# Patient Record
Sex: Female | Born: 1945 | Race: White | Hispanic: No | Marital: Married | Smoking: Never smoker
Health system: Southern US, Community
[De-identification: ages and names within clinical notes are randomized; demographics above are authoritative.]

## PROBLEM LIST (undated history)

## (undated) DIAGNOSIS — E7800 Pure hypercholesterolemia, unspecified: Secondary | ICD-10-CM

## (undated) DIAGNOSIS — E039 Hypothyroidism, unspecified: Secondary | ICD-10-CM

## (undated) DIAGNOSIS — R251 Tremor, unspecified: Secondary | ICD-10-CM

## (undated) DIAGNOSIS — M1990 Unspecified osteoarthritis, unspecified site: Secondary | ICD-10-CM

## (undated) DIAGNOSIS — J189 Pneumonia, unspecified organism: Secondary | ICD-10-CM

## (undated) DIAGNOSIS — Z8489 Family history of other specified conditions: Secondary | ICD-10-CM

## (undated) DIAGNOSIS — E785 Hyperlipidemia, unspecified: Secondary | ICD-10-CM

## (undated) DIAGNOSIS — K222 Esophageal obstruction: Secondary | ICD-10-CM

## (undated) DIAGNOSIS — K5792 Diverticulitis of intestine, part unspecified, without perforation or abscess without bleeding: Secondary | ICD-10-CM

## (undated) DIAGNOSIS — I2584 Coronary atherosclerosis due to calcified coronary lesion: Principal | ICD-10-CM

## (undated) DIAGNOSIS — D649 Anemia, unspecified: Secondary | ICD-10-CM

## (undated) DIAGNOSIS — J3089 Other allergic rhinitis: Secondary | ICD-10-CM

## (undated) DIAGNOSIS — Z87442 Personal history of urinary calculi: Secondary | ICD-10-CM

## (undated) DIAGNOSIS — J45909 Unspecified asthma, uncomplicated: Secondary | ICD-10-CM

## (undated) DIAGNOSIS — K219 Gastro-esophageal reflux disease without esophagitis: Secondary | ICD-10-CM

## (undated) DIAGNOSIS — I2510 Atherosclerotic heart disease of native coronary artery without angina pectoris: Secondary | ICD-10-CM

## (undated) HISTORY — PX: OTHER SURGICAL HISTORY: SHX169

## (undated) HISTORY — DX: Pure hypercholesterolemia, unspecified: E78.00

## (undated) HISTORY — DX: Coronary atherosclerosis due to calcified coronary lesion: I25.84

## (undated) HISTORY — DX: Hyperlipidemia, unspecified: E78.5

## (undated) HISTORY — PX: BREAST SURGERY: SHX581

## (undated) HISTORY — DX: Atherosclerotic heart disease of native coronary artery without angina pectoris: I25.10

## (undated) HISTORY — PX: LITHOTRIPSY: SUR834

## (undated) HISTORY — PX: TUBAL LIGATION: SHX77

## (undated) HISTORY — PX: DILATION AND CURETTAGE OF UTERUS: SHX78

---

## 1970-10-09 HISTORY — PX: BREAST LUMPECTOMY: SHX2

## 2012-10-09 DIAGNOSIS — J189 Pneumonia, unspecified organism: Secondary | ICD-10-CM

## 2012-10-09 HISTORY — DX: Pneumonia, unspecified organism: J18.9

## 2013-12-09 DIAGNOSIS — E039 Hypothyroidism, unspecified: Secondary | ICD-10-CM | POA: Diagnosis not present

## 2013-12-09 DIAGNOSIS — E038 Other specified hypothyroidism: Secondary | ICD-10-CM | POA: Diagnosis not present

## 2014-01-24 DIAGNOSIS — N8500 Endometrial hyperplasia, unspecified: Secondary | ICD-10-CM | POA: Diagnosis not present

## 2014-01-24 DIAGNOSIS — K5732 Diverticulitis of large intestine without perforation or abscess without bleeding: Secondary | ICD-10-CM | POA: Diagnosis not present

## 2014-01-24 DIAGNOSIS — R1032 Left lower quadrant pain: Secondary | ICD-10-CM | POA: Diagnosis not present

## 2014-01-24 DIAGNOSIS — R350 Frequency of micturition: Secondary | ICD-10-CM | POA: Diagnosis not present

## 2014-01-24 DIAGNOSIS — R197 Diarrhea, unspecified: Secondary | ICD-10-CM | POA: Diagnosis not present

## 2014-01-24 DIAGNOSIS — K469 Unspecified abdominal hernia without obstruction or gangrene: Secondary | ICD-10-CM | POA: Diagnosis not present

## 2014-01-24 DIAGNOSIS — R110 Nausea: Secondary | ICD-10-CM | POA: Diagnosis not present

## 2014-02-04 DIAGNOSIS — K5732 Diverticulitis of large intestine without perforation or abscess without bleeding: Secondary | ICD-10-CM | POA: Diagnosis not present

## 2014-02-04 DIAGNOSIS — W57XXXA Bitten or stung by nonvenomous insect and other nonvenomous arthropods, initial encounter: Secondary | ICD-10-CM | POA: Diagnosis not present

## 2014-02-04 DIAGNOSIS — T148 Other injury of unspecified body region: Secondary | ICD-10-CM | POA: Diagnosis not present

## 2014-02-04 DIAGNOSIS — N809 Endometriosis, unspecified: Secondary | ICD-10-CM | POA: Diagnosis not present

## 2014-02-24 DIAGNOSIS — E785 Hyperlipidemia, unspecified: Secondary | ICD-10-CM | POA: Diagnosis not present

## 2014-02-24 DIAGNOSIS — Z0000 Encounter for general adult medical examination without abnormal findings: Secondary | ICD-10-CM | POA: Diagnosis not present

## 2014-02-24 DIAGNOSIS — N809 Endometriosis, unspecified: Secondary | ICD-10-CM | POA: Diagnosis not present

## 2014-02-24 DIAGNOSIS — L989 Disorder of the skin and subcutaneous tissue, unspecified: Secondary | ICD-10-CM | POA: Diagnosis not present

## 2014-02-24 DIAGNOSIS — K5732 Diverticulitis of large intestine without perforation or abscess without bleeding: Secondary | ICD-10-CM | POA: Diagnosis not present

## 2014-03-04 DIAGNOSIS — Z0000 Encounter for general adult medical examination without abnormal findings: Secondary | ICD-10-CM | POA: Diagnosis not present

## 2014-03-04 DIAGNOSIS — E785 Hyperlipidemia, unspecified: Secondary | ICD-10-CM | POA: Diagnosis not present

## 2014-03-11 ENCOUNTER — Other Ambulatory Visit: Payer: Self-pay | Admitting: Gastroenterology

## 2014-03-11 DIAGNOSIS — K5732 Diverticulitis of large intestine without perforation or abscess without bleeding: Secondary | ICD-10-CM | POA: Diagnosis not present

## 2014-03-11 DIAGNOSIS — N393 Stress incontinence (female) (male): Secondary | ICD-10-CM | POA: Diagnosis not present

## 2014-03-11 DIAGNOSIS — R1310 Dysphagia, unspecified: Secondary | ICD-10-CM | POA: Diagnosis not present

## 2014-03-11 DIAGNOSIS — L940 Localized scleroderma [morphea]: Secondary | ICD-10-CM | POA: Diagnosis not present

## 2014-03-11 DIAGNOSIS — N816 Rectocele: Secondary | ICD-10-CM | POA: Diagnosis not present

## 2014-03-12 ENCOUNTER — Other Ambulatory Visit: Payer: Self-pay | Admitting: Obstetrics & Gynecology

## 2014-03-12 DIAGNOSIS — Q838 Other congenital malformations of breast: Secondary | ICD-10-CM

## 2014-03-12 DIAGNOSIS — N6459 Other signs and symptoms in breast: Secondary | ICD-10-CM

## 2014-03-12 DIAGNOSIS — Z124 Encounter for screening for malignant neoplasm of cervix: Secondary | ICD-10-CM | POA: Diagnosis not present

## 2014-04-01 DIAGNOSIS — N393 Stress incontinence (female) (male): Secondary | ICD-10-CM | POA: Diagnosis not present

## 2014-04-01 DIAGNOSIS — D1801 Hemangioma of skin and subcutaneous tissue: Secondary | ICD-10-CM | POA: Diagnosis not present

## 2014-04-01 DIAGNOSIS — R141 Gas pain: Secondary | ICD-10-CM | POA: Diagnosis not present

## 2014-04-01 DIAGNOSIS — D485 Neoplasm of uncertain behavior of skin: Secondary | ICD-10-CM | POA: Diagnosis not present

## 2014-04-01 DIAGNOSIS — L821 Other seborrheic keratosis: Secondary | ICD-10-CM | POA: Diagnosis not present

## 2014-04-01 DIAGNOSIS — L819 Disorder of pigmentation, unspecified: Secondary | ICD-10-CM | POA: Diagnosis not present

## 2014-04-01 DIAGNOSIS — L820 Inflamed seborrheic keratosis: Secondary | ICD-10-CM | POA: Diagnosis not present

## 2014-04-09 ENCOUNTER — Encounter (HOSPITAL_COMMUNITY): Payer: Self-pay | Admitting: Pharmacist

## 2014-04-13 ENCOUNTER — Encounter (HOSPITAL_COMMUNITY): Payer: Self-pay | Admitting: Pharmacy Technician

## 2014-04-15 ENCOUNTER — Other Ambulatory Visit: Payer: Self-pay

## 2014-04-15 ENCOUNTER — Encounter (HOSPITAL_COMMUNITY): Payer: Self-pay

## 2014-04-15 ENCOUNTER — Encounter (HOSPITAL_COMMUNITY)
Admission: RE | Admit: 2014-04-15 | Discharge: 2014-04-15 | Disposition: A | Discharge status: Home / Self Care | Payer: Medicare Other | Source: Ambulatory Visit | Attending: Obstetrics & Gynecology | Admitting: Obstetrics & Gynecology

## 2014-04-15 DIAGNOSIS — Z01812 Encounter for preprocedural laboratory examination: Secondary | ICD-10-CM | POA: Diagnosis not present

## 2014-04-15 DIAGNOSIS — Z01810 Encounter for preprocedural cardiovascular examination: Secondary | ICD-10-CM | POA: Insufficient documentation

## 2014-04-15 HISTORY — DX: Pneumonia, unspecified organism: J18.9

## 2014-04-15 HISTORY — DX: Hypothyroidism, unspecified: E03.9

## 2014-04-15 HISTORY — DX: Esophageal obstruction: K22.2

## 2014-04-15 HISTORY — DX: Diverticulitis of intestine, part unspecified, without perforation or abscess without bleeding: K57.92

## 2014-04-15 HISTORY — DX: Gastro-esophageal reflux disease without esophagitis: K21.9

## 2014-04-15 LAB — CBC
HEMATOCRIT: 43.2 % (ref 36.0–46.0)
Hemoglobin: 14.5 g/dL (ref 12.0–15.0)
MCH: 31 pg (ref 26.0–34.0)
MCHC: 33.6 g/dL (ref 30.0–36.0)
MCV: 92.3 fL (ref 78.0–100.0)
Platelets: 257 10*3/uL (ref 150–400)
RBC: 4.68 MIL/uL (ref 3.87–5.11)
RDW: 13.8 % (ref 11.5–15.5)
WBC: 7.5 10*3/uL (ref 4.0–10.5)

## 2014-04-15 NOTE — Patient Instructions (Addendum)
Your procedure is scheduled on:04/20/14  Enter through the Main Entrance at :11am Pick up desk phone and dial [phone] and inform us of your arrival.  Please call [phone] if you have any problems the morning of surgery.  Remember: Do not eat food after midnight:Sunday Clear liquids are ok until:0830 am Monday   You may brush your teeth the morning of surgery.  Take these meds the morning of surgery with a sip of water: Synthroid  DO NOT wear jewelry, eye make-up, lipstick,body lotion, or dark fingernail polish.  (Polished toes are ok) You may wear deodorant.  If you are to be admitted after surgery, leave suitcase in car until your room has been assigned. Patients discharged on the day of surgery will not be allowed to drive home. Wear loose fitting, comfortable clothes for your ride home.

## 2014-04-20 ENCOUNTER — Ambulatory Visit (HOSPITAL_COMMUNITY)
Admission: RE | Admit: 2014-04-20 | Discharge: 2014-04-20 | Disposition: A | Discharge status: Home / Self Care | Payer: Medicare Other | Source: Ambulatory Visit | Attending: Obstetrics & Gynecology | Admitting: Obstetrics & Gynecology

## 2014-04-20 ENCOUNTER — Encounter (HOSPITAL_COMMUNITY): Payer: Self-pay

## 2014-04-20 ENCOUNTER — Encounter (HOSPITAL_COMMUNITY)
Admission: RE | Disposition: A | Discharge status: Home / Self Care | Payer: Self-pay | Source: Ambulatory Visit | Attending: Obstetrics & Gynecology

## 2014-04-20 ENCOUNTER — Ambulatory Visit (HOSPITAL_COMMUNITY): Payer: Medicare Other | Admitting: Certified Registered Nurse Anesthetist

## 2014-04-20 ENCOUNTER — Encounter (HOSPITAL_COMMUNITY): Payer: Medicare Other | Admitting: Certified Registered Nurse Anesthetist

## 2014-04-20 DIAGNOSIS — Z880 Allergy status to penicillin: Secondary | ICD-10-CM | POA: Diagnosis not present

## 2014-04-20 DIAGNOSIS — E039 Hypothyroidism, unspecified: Secondary | ICD-10-CM | POA: Diagnosis not present

## 2014-04-20 DIAGNOSIS — N859 Noninflammatory disorder of uterus, unspecified: Secondary | ICD-10-CM | POA: Diagnosis not present

## 2014-04-20 DIAGNOSIS — Z780 Asymptomatic menopausal state: Secondary | ICD-10-CM | POA: Diagnosis not present

## 2014-04-20 DIAGNOSIS — K5732 Diverticulitis of large intestine without perforation or abscess without bleeding: Secondary | ICD-10-CM | POA: Diagnosis not present

## 2014-04-20 DIAGNOSIS — K219 Gastro-esophageal reflux disease without esophagitis: Secondary | ICD-10-CM | POA: Insufficient documentation

## 2014-04-20 DIAGNOSIS — Q391 Atresia of esophagus with tracheo-esophageal fistula: Secondary | ICD-10-CM | POA: Insufficient documentation

## 2014-04-20 DIAGNOSIS — N840 Polyp of corpus uteri: Secondary | ICD-10-CM | POA: Insufficient documentation

## 2014-04-20 DIAGNOSIS — R9389 Abnormal findings on diagnostic imaging of other specified body structures: Secondary | ICD-10-CM | POA: Diagnosis not present

## 2014-04-20 DIAGNOSIS — Q393 Congenital stenosis and stricture of esophagus: Secondary | ICD-10-CM

## 2014-04-20 DIAGNOSIS — Z9889 Other specified postprocedural states: Secondary | ICD-10-CM

## 2014-04-20 HISTORY — PX: HYSTEROSCOPY WITH D & C: SHX1775

## 2014-04-20 HISTORY — DX: Family history of other specified conditions: Z84.89

## 2014-04-20 SURGERY — DILATATION AND CURETTAGE /HYSTEROSCOPY
Anesthesia: General | Site: Vagina

## 2014-04-20 MED ORDER — ONDANSETRON HCL 4 MG/2ML IJ SOLN
INTRAMUSCULAR | Status: DC | PRN
  Administered 2014-04-20: 4 mg via INTRAVENOUS

## 2014-04-20 MED ORDER — KETOROLAC TROMETHAMINE 30 MG/ML IJ SOLN
INTRAMUSCULAR | Status: DC | PRN
  Administered 2014-04-20: 30 mg via INTRAVENOUS

## 2014-04-20 MED ORDER — KETOROLAC TROMETHAMINE 30 MG/ML IJ SOLN
INTRAMUSCULAR | Status: AC
  Filled 2014-04-20: qty 1

## 2014-04-20 MED ORDER — LIDOCAINE HCL 1 % IJ SOLN
INTRAMUSCULAR | Status: AC
  Filled 2014-04-20: qty 20

## 2014-04-20 MED ORDER — FENTANYL CITRATE 0.05 MG/ML IJ SOLN
INTRAMUSCULAR | Status: AC
  Filled 2014-04-20: qty 5

## 2014-04-20 MED ORDER — FENTANYL CITRATE 0.05 MG/ML IJ SOLN
INTRAMUSCULAR | Status: DC | PRN
  Administered 2014-04-20 (×2): 25 ug via INTRAVENOUS
  Administered 2014-04-20 (×4): 50 ug via INTRAVENOUS

## 2014-04-20 MED ORDER — PROPOFOL 10 MG/ML IV EMUL
INTRAVENOUS | Status: AC
  Filled 2014-04-20: qty 20

## 2014-04-20 MED ORDER — PROPOFOL 10 MG/ML IV BOLUS
INTRAVENOUS | Status: DC | PRN
  Administered 2014-04-20: 30 mg via INTRAVENOUS
  Administered 2014-04-20: 170 mg via INTRAVENOUS

## 2014-04-20 MED ORDER — MIDAZOLAM HCL 2 MG/2ML IJ SOLN
INTRAMUSCULAR | Status: AC
  Filled 2014-04-20: qty 2

## 2014-04-20 MED ORDER — LIDOCAINE HCL (CARDIAC) 20 MG/ML IV SOLN
INTRAVENOUS | Status: AC
  Filled 2014-04-20: qty 5

## 2014-04-20 MED ORDER — FENTANYL CITRATE 0.05 MG/ML IJ SOLN
25.0000 ug | INTRAMUSCULAR | Status: DC | PRN
  Administered 2014-04-20: 25 ug via INTRAVENOUS

## 2014-04-20 MED ORDER — FENTANYL CITRATE 0.05 MG/ML IJ SOLN
INTRAMUSCULAR | Status: AC
  Filled 2014-04-20: qty 2

## 2014-04-20 MED ORDER — OXYCODONE-ACETAMINOPHEN 5-325 MG PO TABS: 1.0000 | ORAL_TABLET | ORAL | Status: DC | PRN

## 2014-04-20 MED ORDER — EPHEDRINE SULFATE 50 MG/ML IJ SOLN
INTRAMUSCULAR | Status: DC | PRN
  Administered 2014-04-20: 10 mg via INTRAVENOUS
  Administered 2014-04-20: 5 mg via INTRAVENOUS

## 2014-04-20 MED ORDER — MIDAZOLAM HCL 2 MG/2ML IJ SOLN
INTRAMUSCULAR | Status: DC | PRN
  Administered 2014-04-20: 1 mg via INTRAVENOUS

## 2014-04-20 MED ORDER — LIDOCAINE HCL (CARDIAC) 20 MG/ML IV SOLN
INTRAVENOUS | Status: DC | PRN
  Administered 2014-04-20: 30 mg via INTRAVENOUS
  Administered 2014-04-20: 70 mg via INTRAVENOUS

## 2014-04-20 MED ORDER — IBUPROFEN 800 MG PO TABS: 800.0000 mg | ORAL_TABLET | Freq: Three times a day (TID) | ORAL | Status: DC | PRN

## 2014-04-20 MED ORDER — LIDOCAINE HCL 1 % IJ SOLN
INTRAMUSCULAR | Status: DC | PRN
  Administered 2014-04-20: 10 mL

## 2014-04-20 MED ORDER — LACTATED RINGERS IV SOLN
INTRAVENOUS | Status: DC
  Administered 2014-04-20: 11:00:00 via INTRAVENOUS

## 2014-04-20 MED ORDER — ONDANSETRON HCL 4 MG/2ML IJ SOLN
INTRAMUSCULAR | Status: AC
  Filled 2014-04-20: qty 2

## 2014-04-20 SURGICAL SUPPLY — 19 items
CANISTER SUCT 3000ML (MISCELLANEOUS) ×2 IMPLANT
CATH ROBINSON RED A/P 16FR (CATHETERS) ×2 IMPLANT
CLOTH BEACON ORANGE TIMEOUT ST (SAFETY) ×2 IMPLANT
CONTAINER PREFILL 10% NBF 60ML (FORM) ×4 IMPLANT
DRAPE HYSTEROSCOPY (DRAPE) ×2 IMPLANT
ELECT REM PT RETURN 9FT ADLT (ELECTROSURGICAL)
ELECTRODE REM PT RTRN 9FT ADLT (ELECTROSURGICAL) IMPLANT
GLOVE BIO SURGEON STRL SZ 6.5 (GLOVE) ×2 IMPLANT
GLOVE BIO SURGEON STRL SZ7 (GLOVE) ×2 IMPLANT
GLOVE BIOGEL PI IND STRL 7.0 (GLOVE) ×1 IMPLANT
GLOVE BIOGEL PI INDICATOR 7.0 (GLOVE) ×1
GOWN STRL REUS W/TWL LRG LVL3 (GOWN DISPOSABLE) ×4 IMPLANT
LOOP ANGLED CUTTING 22FR (CUTTING LOOP) IMPLANT
PACK VAGINAL MINOR WOMEN LF (CUSTOM PROCEDURE TRAY) ×2 IMPLANT
PAD OB MATERNITY 4.3X12.25 (PERSONAL CARE ITEMS) ×2 IMPLANT
SET TUBING HYSTEROSCOPY 2 NDL (TUBING) ×2 IMPLANT
TOWEL OR 17X24 6PK STRL BLUE (TOWEL DISPOSABLE) ×4 IMPLANT
TUBE HYSTEROSCOPY W Y-CONNECT (TUBING) ×2 IMPLANT
WATER STERILE IRR 1000ML POUR (IV SOLUTION) ×2 IMPLANT

## 2014-04-20 NOTE — Op Note (Signed)
Surgeon: Caffie Damme   Assistants: none  Pre-operative Diagnosis: Thickened Endometrial stripe in post menopausal female  Post-operative Diagnosis: same  Surgeon: Surgeon(s) and Role:    * [name], MD - Primary   Procedure Procedure(s) and Anesthesia Type:    * DILATATION AND CURETTAGE /HYSTEROSCOPY - General  Anesthesia: General LMA anesthesia  ASA Class: 2  EBL: 10 mL  UOP: 10 mL  IVF: 700 mL  Specimen: Endometrial curettings  Findings: Exam under anesthesia: External vulva: Evidence of lichen sclerosis et atrophicus. Vaginal vault: normal cervix: grossly normal  Uterus: Enlarged for Post menopausal female 10 weeks size, mobile, no palpable masses Adnexa: no palpable masses Hysteroscopy: hyperemia throughout,  Endometrial polyps, with possible calcifications.   Complications: None  After adequate anesthesia was achieved, the patient was prepped and draped in the usual sterile fashion.  The speculum was placed in the vagina and the cervix stabilized with a single-tooth tenaculum.  The  cervix was dilated with pratt dilators and sounded to 8 cm. The hysteroscope passed inside the endometrial cavity. The above findings were noted and sharp curettage was then performed and uterine curettings sent to path. Hysteroscope was re-introduced and the endometrial polyp had been extracted. All instruments were removed from the vagina.  The patient tolerated the procedure well.     Georgean Spainhower STACIA

## 2014-04-20 NOTE — Discharge Instructions (Signed)

## 2014-04-20 NOTE — Anesthesia Preprocedure Evaluation (Signed)
Anesthesia Evaluation  Patient identified by MRN, date of birth, ID band Patient awake    Reviewed: Allergy & Precautions, H&P , Patient's Chart, lab work & pertinent test results, reviewed documented beta blocker date and time   Airway Mallampati: II TM Distance: >3 FB Neck ROM: full    Dental no notable dental hx.    Pulmonary  breath sounds clear to auscultation  Pulmonary exam normal       Cardiovascular Rhythm:regular Rate:Normal     Neuro/Psych    GI/Hepatic   Endo/Other    Renal/GU      Musculoskeletal   Abdominal   Peds  Hematology   Anesthesia Other Findings   Reproductive/Obstetrics                           Anesthesia Physical Anesthesia Plan  ASA: III  Anesthesia Plan:    Post-op Pain Management:    Induction: Intravenous  Airway Management Planned: LMA  Additional Equipment:   Intra-op Plan:   Post-operative Plan:   Informed Consent: I have reviewed the patients History and Physical, chart, labs and discussed the procedure including the risks, benefits and alternatives for the proposed anesthesia with the patient or authorized representative who has indicated his/her understanding and acceptance.   Dental Advisory Given and Dental advisory given  Plan Discussed with: CRNA and Surgeon  Anesthesia Plan Comments: (Discussed GA with LMA, possible sore throat, potential need to switch to ETT, N/V, pulmonary aspiration. Questions answered. )        Anesthesia Quick Evaluation

## 2014-04-20 NOTE — Discharge Summary (Signed)
Physician Discharge Summary  Patient ID: Katie Weber MRN: [id number] DOB/AGE: 1946/08/04 68 y.o.  Admit date: 04/20/2014 Discharge date: 04/20/2014  Admission Diagnoses:Thickened EMS  Discharge Diagnoses:  Active Problems:   * No active hospital problems. *   Discharged Condition: good  Hospital Course: Patient taken to OR for D&C hysteroscopy patient tolerated procedure well and was Discharged home from PACU  Consults: None  Significant Diagnostic Studies: radiology: Ultrasound: thickened EMS 47mm cystic components  Treatments: IV hydration  Discharge Exam: Blood pressure 145/89, pulse 76, temperature 98.2 F (36.8 C), temperature source Oral, resp. rate 18, SpO2 99.00%. Findings in OR: Findings: Exam under anesthesia: External vulva: Evidence of lichen sclerosis et atrophicus. Vaginal vault: normal cervix: grossly normal  Uterus: Enlarged for Post menopausal female 10 weeks size, mobile, no palpable masses Adnexa: no palpable masses Hysteroscopy: hyperemia throughout,  Endometrial polyps, with possible calcifications.    Disposition: Final discharge disposition not confirmed  Discharge Instructions   Call MD for:  difficulty breathing, headache or visual disturbances    Complete by:  As directed      Call MD for:  extreme fatigue    Complete by:  As directed      Call MD for:  hives    Complete by:  As directed      Call MD for:  persistant dizziness or light-headedness    Complete by:  As directed      Call MD for:  persistant nausea and vomiting    Complete by:  As directed      Call MD for:  redness, tenderness, or signs of infection (pain, swelling, redness, odor or green/yellow discharge around incision site)    Complete by:  As directed      Call MD for:  severe uncontrolled pain    Complete by:  As directed      Call MD for:  temperature >100.4    Complete by:  As directed      Call MD for:    Complete by:  As directed   Heavy vaginal bleeding     Diet - low sodium heart healthy    Complete by:  As directed      Discharge instructions    Complete by:  As directed   Call office for post operative appointment or for any questions or concerns     Increase activity slowly    Complete by:  As directed      Sexual Activity Restrictions    Complete by:  As directed   No intercourse for at least 2-3 weeks            Medication List         ibuprofen 800 MG tablet  Commonly known as:  ADVIL,MOTRIN  Take 1 tablet (800 mg total) by mouth every 8 (eight) hours as needed for cramping.     levothyroxine 100 MCG tablet  Commonly known as:  SYNTHROID, LEVOTHROID  Take 100 mcg by mouth daily before breakfast.     oxyCODONE-acetaminophen 5-325 MG per tablet  Commonly known as:  ROXICET  Take 1-2 tablets by mouth every 4 (four) hours as needed for severe pain.     permethrin 5 % cream  Commonly known as:  ELIMITE  Apply 1 application topically once.     tretinoin 0.025 % gel  Commonly known as:  RETIN-A  Apply 1 application topically every other day. Takes at bedtime         Signed: [name]  STACIA 04/20/2014, 1:09 PM

## 2014-04-20 NOTE — Brief Op Note (Signed)
04/20/2014  1:02 PM  PATIENT:  Mechele Claude Spurrier  68 y.o. female  PRE-OPERATIVE DIAGNOSIS:  THICKENED ENDOMETRIUM  POST-OPERATIVE DIAGNOSIS:  THICKENED ENDOMETRIUM  PROCEDURE:  Procedure(s): DILATATION AND CURETTAGE /HYSTEROSCOPY (N/A)  SURGEON:  Surgeon(s) and Role:    * [name], MD - Primary  PHYSICIAN ASSISTANT:   ASSISTANTS: none   ANESTHESIA:   local and IV sedation  EBL:  Total I/O In: 700 [I.V.:700] Out: 20 [Urine:10; Blood:10]  BLOOD ADMINISTERED:none  DRAINS: none   LOCAL MEDICATIONS USED:  LIDOCAINE   SPECIMEN:  Source of Specimen:  Endometrial curettings  DISPOSITION OF SPECIMEN:  PATHOLOGY  COUNTS:  YES  TOURNIQUET:  * No tourniquets in log *  DICTATION: .Note written in EPIC  PLAN OF CARE: Discharge to home after PACU  PATIENT DISPOSITION:  PACU - hemodynamically stable.   Delay start of Pharmacological VTE agent (>24hrs) due to surgical blood loss or risk of bleeding: not applicable  Demani Weyrauch STACIA

## 2014-04-20 NOTE — Transfer of Care (Signed)
Immediate Anesthesia Transfer of Care Note  Patient: Katie Weber  Procedure(s) Performed: Procedure(s): DILATATION AND CURETTAGE /HYSTEROSCOPY (N/A)  Patient Location: PACU  Anesthesia Type:General  Level of Consciousness: awake, alert , oriented and patient cooperative  Airway & Oxygen Therapy: Patient Spontanous Breathing and Patient connected to nasal cannula oxygen  Post-op Assessment: Report given to PACU RN and Post -op Vital signs reviewed and stable  Post vital signs: Reviewed and stable  Complications: No apparent anesthesia complications

## 2014-04-20 NOTE — Anesthesia Postprocedure Evaluation (Signed)
  Anesthesia Post-op Note  Patient: Katie Weber  Procedure(s) Performed: Procedure(s): DILATATION AND CURETTAGE /HYSTEROSCOPY (N/A)  Patient Location: PACU  Anesthesia Type:General  Level of Consciousness: awake, alert  and oriented  Airway and Oxygen Therapy: Patient Spontanous Breathing  Post-op Pain: mild  Post-op Assessment: Post-op Vital signs reviewed, Patient's Cardiovascular Status Stable, Respiratory Function Stable, Patent Airway, No signs of Nausea or vomiting and Pain level controlled  Post-op Vital Signs: Reviewed and stable  Last Vitals:  Filed Vitals:   04/20/14 1427  BP:   Pulse: 67  Temp:   Resp:     Complications: No apparent anesthesia complications

## 2014-04-20 NOTE — H&P (Signed)
Katie Weber is an 68 y.o. female presents for [facility] hysteroscopy for thickened endometrial  Stripe on both CT scan and ultrasound. Pelvic US shows multiple cystic regions of endometrium  Measures 43mm. Patient does not have post menopausal bleeding  Pertinent Gynecological History: Menses: post-menopausal Bleeding: no PMB Contraception: none DES exposure: denies Blood transfusions: none Sexually transmitted diseases: no past history Previous GYN Procedures: DNC  Last mammogram: normal Last pap: normal Date: 04/01/2014 OB History: G4, P2   Menstrual History: Menarche age: 60  No LMP recorded. Patient is postmenopausal.    Past Medical History  Diagnosis Date  . Hypothyroidism   . Pneumonia 2014  . GERD (gastroesophageal reflux disease)     no meds  . Diverticulitis   . 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 Family history of anesthesia complication 26JFH ago    allergic reaction to atropine    Past Surgical History  Procedure Laterality Date  . Lithotripsy    . Tubal ligation    . Breast surgery      No family history on file.  Social History:  has no tobacco, alcohol, and drug history on file.  Allergies:  Allergies  Allergen Reactions  . Penicillins Hives    Prescriptions prior to admission  Medication Sig Dispense Refill  . levothyroxine (SYNTHROID, LEVOTHROID) 100 MCG tablet Take 100 mcg by mouth daily before breakfast.      . permethrin (ELIMITE) 5 % cream Apply 1 application topically once.      . tretinoin (RETIN-A) 0.025 % gel Apply 1 application topically every other day. Takes at bedtime        Review of Systems  Constitutional: Negative for fever and chills.  Eyes: Negative for blurred vision.  Respiratory: Negative for cough and hemoptysis.   Cardiovascular: Negative for chest pain and palpitations.  Gastrointestinal: Negative for heartburn, nausea and vomiting.  Genitourinary: Negative for dysuria.   Musculoskeletal: Negative for myalgias.  Skin: Negative for rash.  Neurological: Negative for dizziness and headaches.  Endo/Heme/Allergies: Does not bruise/bleed easily.  Psychiatric/Behavioral: Negative for depression.    Blood pressure 145/89, pulse 76, temperature 98.2 F (36.8 C), temperature source Oral, resp. rate 18, SpO2 99.00%. Physical Exam  Constitutional: She is oriented to person, place, and time. She appears well-developed and well-nourished.  HENT:  Head: Normocephalic.  Neck: Normal range of motion.  Cardiovascular: Normal rate and regular rhythm.   Respiratory: Effort normal and breath sounds normal.  GI: Soft.  Genitourinary: Vagina normal and uterus normal.  Musculoskeletal: Normal range of motion.  Neurological: She is alert and oriented to person, place, and time.    No results found for this or any previous visit (from the past 24 hour(s)).  No results found.  Assessment/Plan: Post menopausal female with thickened endometrial stripe Suspicious for endometrial hyperplasia.  We discussed R/B/A/I for [facility] hysteroscopy will send tissue to Pathology  [name] [facility] 04/20/2014, 11:38 AM

## 2014-04-21 ENCOUNTER — Encounter (HOSPITAL_COMMUNITY): Payer: Self-pay | Admitting: Obstetrics & Gynecology

## 2014-04-27 ENCOUNTER — Encounter (HOSPITAL_COMMUNITY): Payer: Self-pay | Admitting: *Deleted

## 2014-05-04 ENCOUNTER — Encounter (HOSPITAL_COMMUNITY)
Admission: RE | Disposition: A | Discharge status: Home / Self Care | Payer: Self-pay | Source: Ambulatory Visit | Attending: Gastroenterology

## 2014-05-04 ENCOUNTER — Encounter (HOSPITAL_COMMUNITY): Payer: Medicare Other | Admitting: Anesthesiology

## 2014-05-04 ENCOUNTER — Ambulatory Visit (HOSPITAL_COMMUNITY)
Admission: RE | Admit: 2014-05-04 | Discharge: 2014-05-04 | Disposition: A | Discharge status: Home / Self Care | Payer: Medicare Other | Source: Ambulatory Visit | Attending: Gastroenterology | Admitting: Gastroenterology

## 2014-05-04 ENCOUNTER — Other Ambulatory Visit (HOSPITAL_COMMUNITY): Payer: Self-pay | Admitting: Gastroenterology

## 2014-05-04 ENCOUNTER — Encounter (HOSPITAL_COMMUNITY): Payer: Self-pay | Admitting: *Deleted

## 2014-05-04 ENCOUNTER — Ambulatory Visit (HOSPITAL_COMMUNITY): Payer: Medicare Other | Admitting: Anesthesiology

## 2014-05-04 DIAGNOSIS — IMO0002 Reserved for concepts with insufficient information to code with codable children: Secondary | ICD-10-CM | POA: Insufficient documentation

## 2014-05-04 DIAGNOSIS — Z01818 Encounter for other preprocedural examination: Secondary | ICD-10-CM | POA: Diagnosis not present

## 2014-05-04 DIAGNOSIS — K449 Diaphragmatic hernia without obstruction or gangrene: Secondary | ICD-10-CM | POA: Diagnosis not present

## 2014-05-04 DIAGNOSIS — Z9851 Tubal ligation status: Secondary | ICD-10-CM | POA: Diagnosis not present

## 2014-05-04 DIAGNOSIS — R1310 Dysphagia, unspecified: Secondary | ICD-10-CM

## 2014-05-04 DIAGNOSIS — D375 Neoplasm of uncertain behavior of rectum: Secondary | ICD-10-CM | POA: Insufficient documentation

## 2014-05-04 DIAGNOSIS — D126 Benign neoplasm of colon, unspecified: Secondary | ICD-10-CM | POA: Diagnosis not present

## 2014-05-04 DIAGNOSIS — K5730 Diverticulosis of large intestine without perforation or abscess without bleeding: Secondary | ICD-10-CM | POA: Insufficient documentation

## 2014-05-04 DIAGNOSIS — K219 Gastro-esophageal reflux disease without esophagitis: Secondary | ICD-10-CM | POA: Diagnosis not present

## 2014-05-04 DIAGNOSIS — E039 Hypothyroidism, unspecified: Secondary | ICD-10-CM | POA: Insufficient documentation

## 2014-05-04 DIAGNOSIS — D371 Neoplasm of uncertain behavior of stomach: Secondary | ICD-10-CM | POA: Diagnosis not present

## 2014-05-04 DIAGNOSIS — Y838 Other surgical procedures as the cause of abnormal reaction of the patient, or of later complication, without mention of misadventure at the time of the procedure: Secondary | ICD-10-CM | POA: Diagnosis not present

## 2014-05-04 DIAGNOSIS — Z79899 Other long term (current) drug therapy: Secondary | ICD-10-CM | POA: Insufficient documentation

## 2014-05-04 DIAGNOSIS — R1319 Other dysphagia: Secondary | ICD-10-CM | POA: Insufficient documentation

## 2014-05-04 DIAGNOSIS — K222 Esophageal obstruction: Secondary | ICD-10-CM | POA: Diagnosis not present

## 2014-05-04 DIAGNOSIS — J45909 Unspecified asthma, uncomplicated: Secondary | ICD-10-CM | POA: Diagnosis not present

## 2014-05-04 DIAGNOSIS — D378 Neoplasm of uncertain behavior of other specified digestive organs: Secondary | ICD-10-CM

## 2014-05-04 DIAGNOSIS — Z87442 Personal history of urinary calculi: Secondary | ICD-10-CM | POA: Diagnosis not present

## 2014-05-04 DIAGNOSIS — J984 Other disorders of lung: Secondary | ICD-10-CM | POA: Insufficient documentation

## 2014-05-04 HISTORY — PX: COLONOSCOPY WITH PROPOFOL: SHX5780

## 2014-05-04 HISTORY — PX: ESOPHAGOGASTRODUODENOSCOPY (EGD) WITH PROPOFOL: SHX5813

## 2014-05-04 HISTORY — PX: BALLOON DILATION: SHX5330

## 2014-05-04 HISTORY — DX: Unspecified osteoarthritis, unspecified site: M19.90

## 2014-05-04 HISTORY — DX: Personal history of urinary calculi: Z87.442

## 2014-05-04 IMAGING — CR DG CHEST 2V
2 series · 2 of 2 positions shown · non-contrast
Comparison: None.

CLINICAL DATA: Dysphagia.  History of pneumonia in [4P].

EXAM:
CHEST  2 VIEW

[w chest pa]
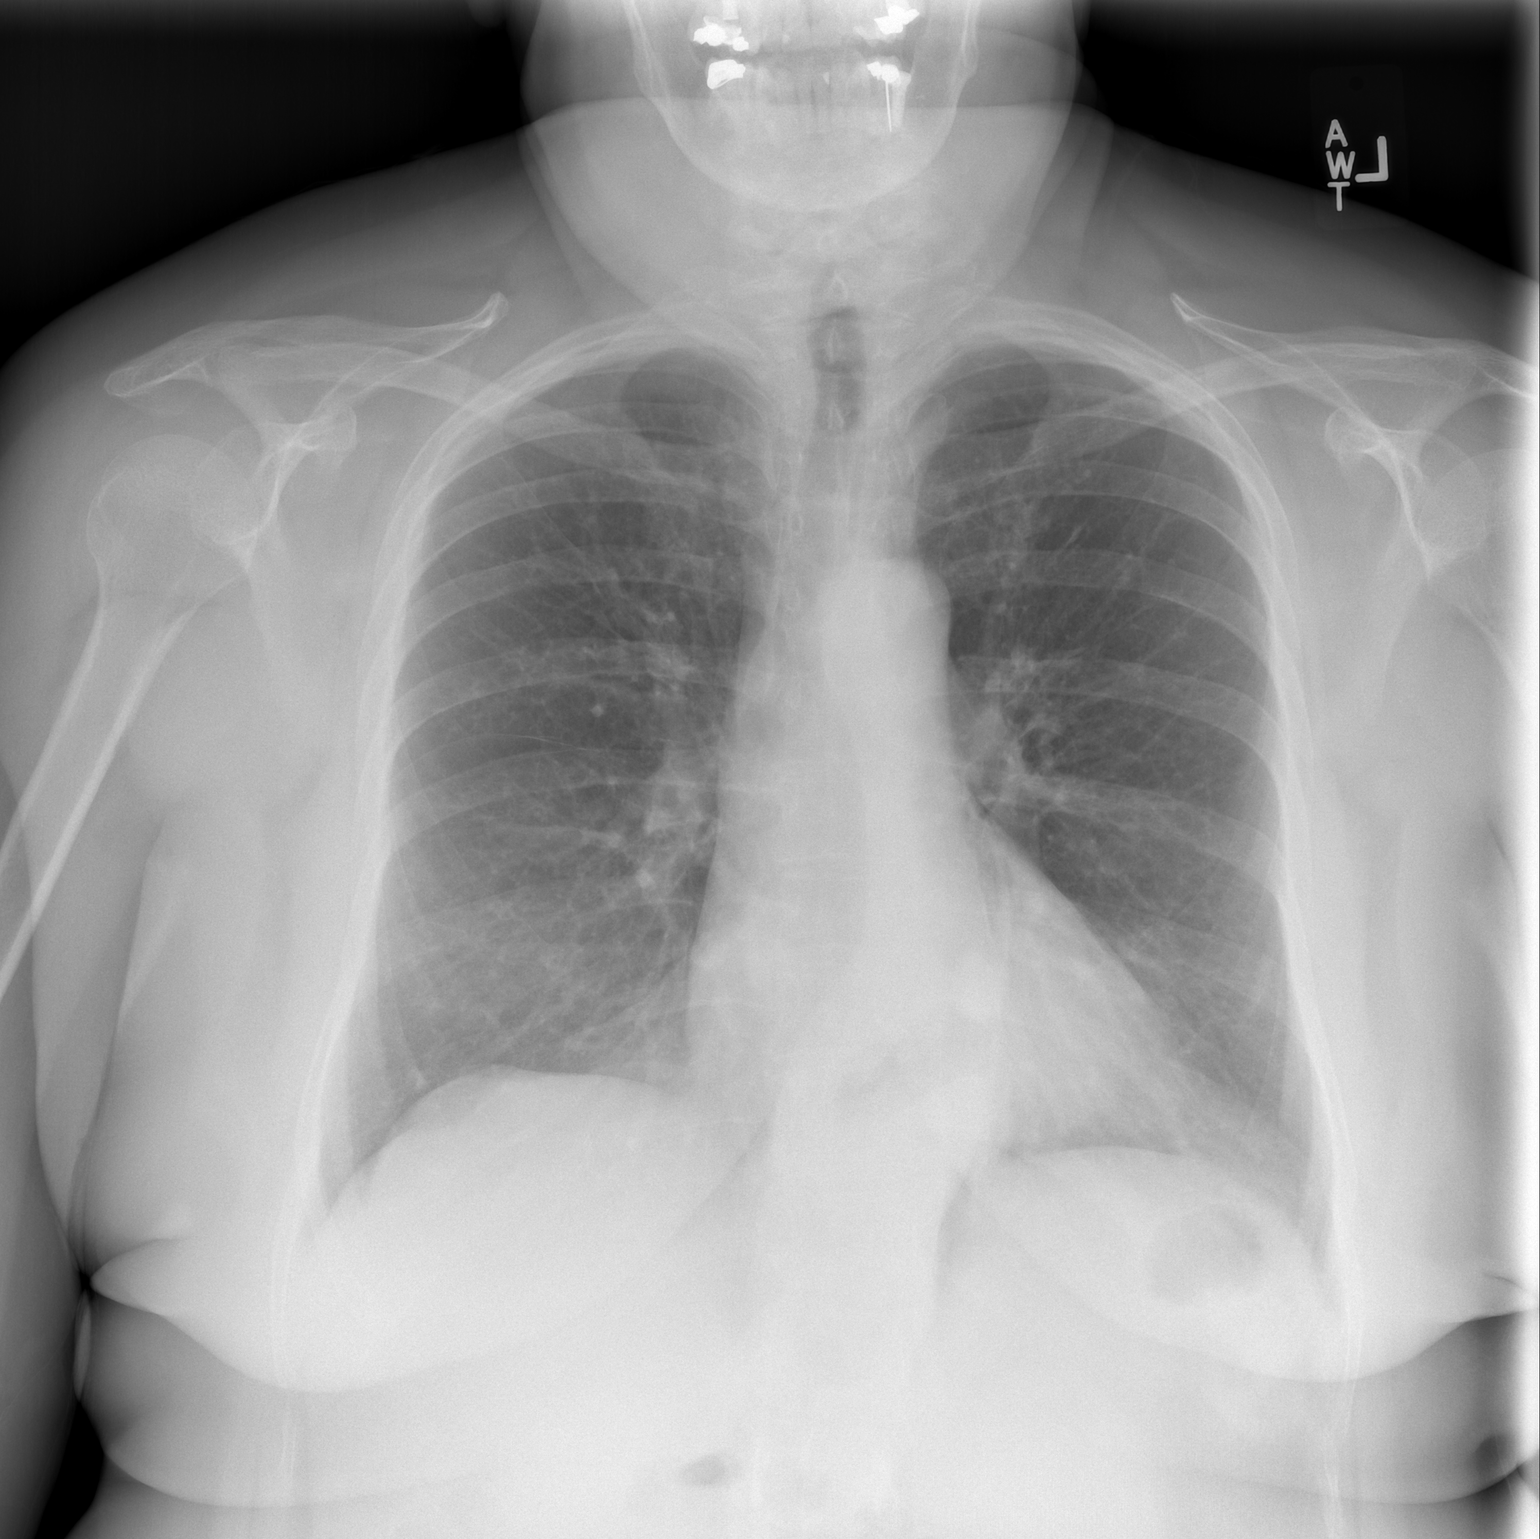

[w chest lat]
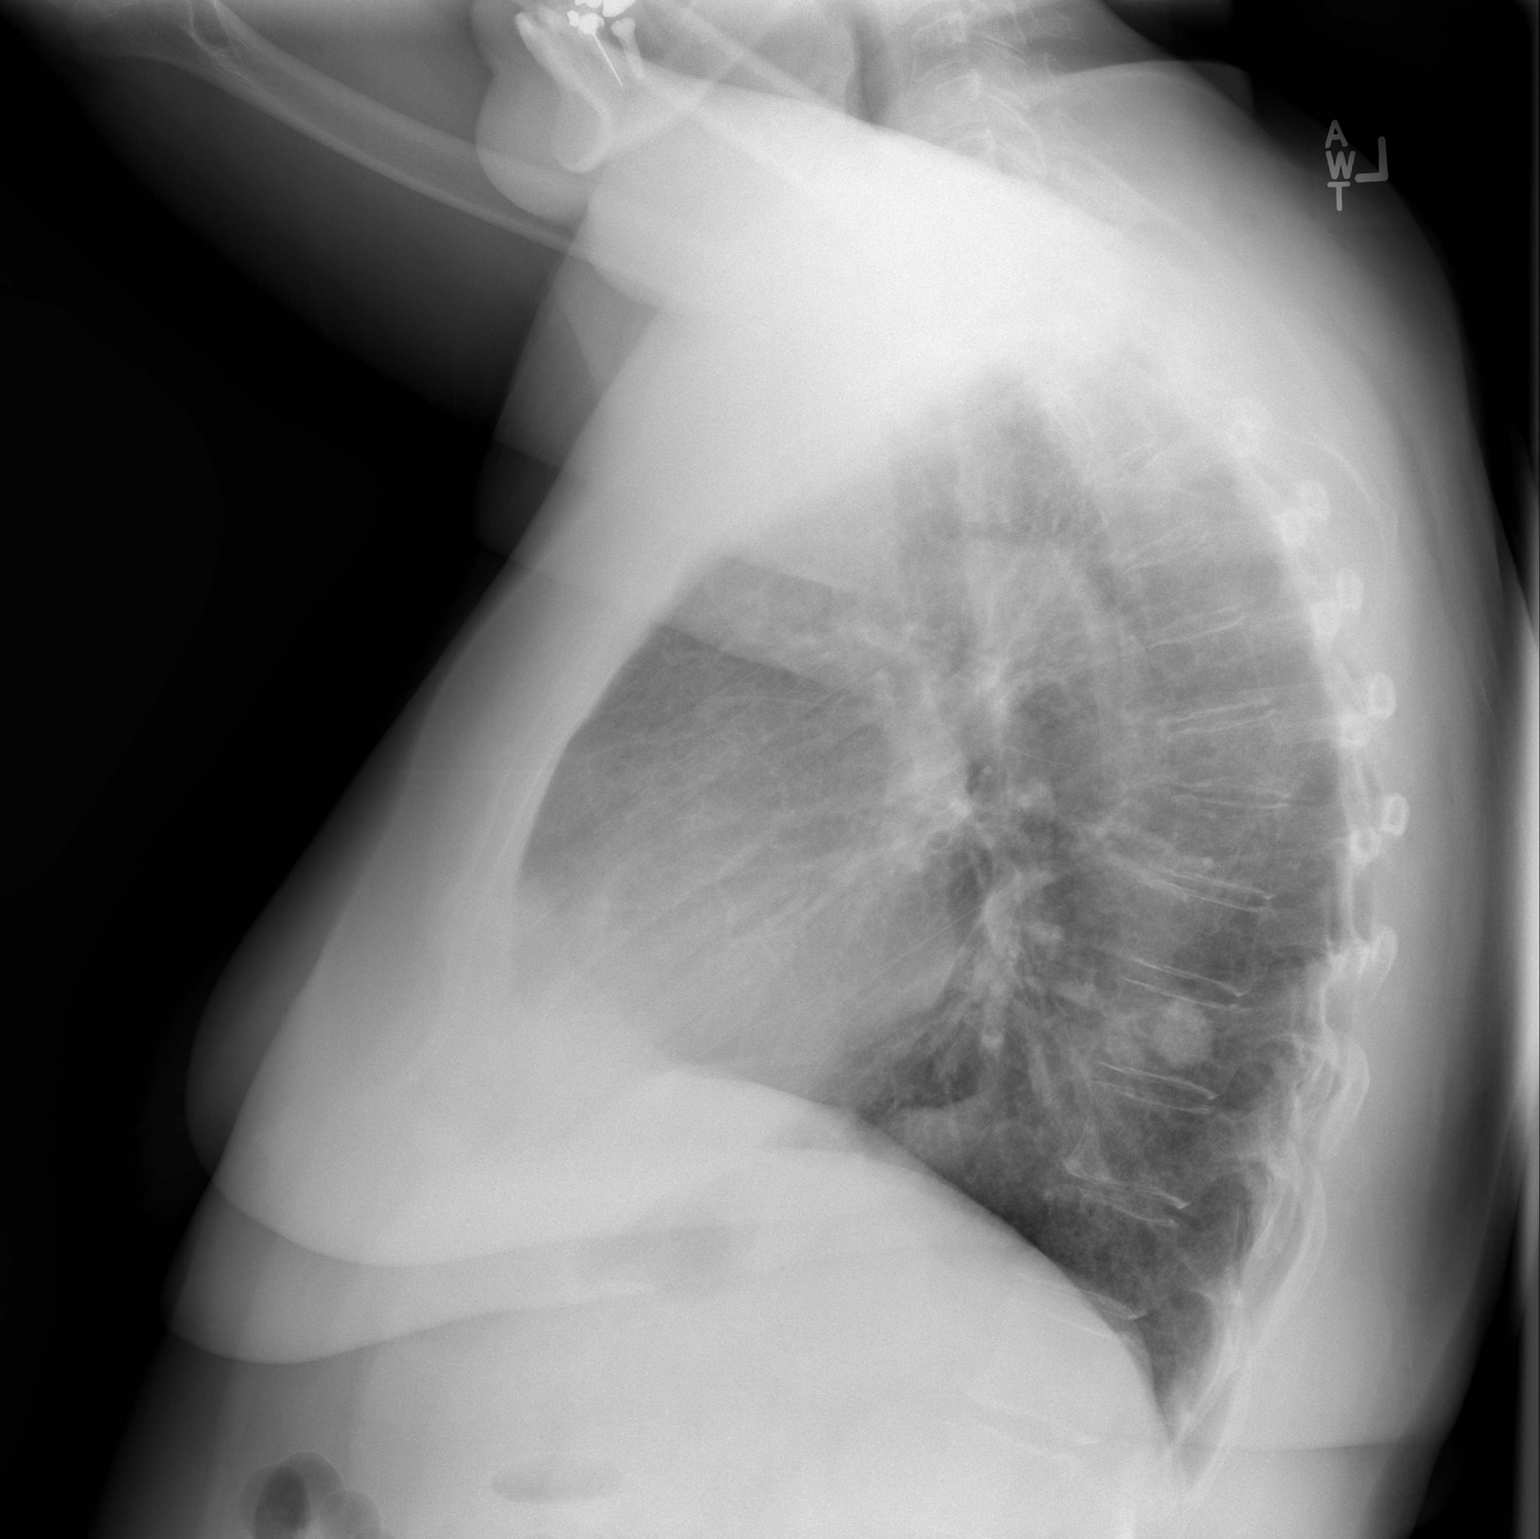

[2 of 2 positions shown; findings below may reference images not displayed]

FINDINGS: Mild wedging of a mid thoracic vertebral body.

Midline trachea. Normal heart size. Small to moderate hiatal hernia
with suggestion of air-fluid level in the herniated stomach. No
pleural effusion or pneumothorax. Increased density posteriorly on
the lateral view measures 1.8 cm. Favored to represent a a left
lower lobe calcified granuloma, when correlated with the frontal
radiograph. Right lung clear.
IMPRESSION: Small to moderate hiatal hernia. Suggestion of fluid level in the
herniated stomach, indicative of dysmotility or gastroesophageal
reflux disease.

Calcified granuloma within the left lower lobe.

Mild mid thoracic compression deformity.

## 2014-05-04 SURGERY — COLONOSCOPY WITH PROPOFOL
Anesthesia: Monitor Anesthesia Care

## 2014-05-04 MED ORDER — MIDAZOLAM HCL 5 MG/5ML IJ SOLN
INTRAMUSCULAR | Status: DC | PRN
  Administered 2014-05-04 (×2): 1 mg via INTRAVENOUS

## 2014-05-04 MED ORDER — LACTATED RINGERS IV SOLN
INTRAVENOUS | Status: DC
  Administered 2014-05-04: 13:00:00 via INTRAVENOUS

## 2014-05-04 MED ORDER — SODIUM CHLORIDE 0.9 % IV SOLN: INTRAVENOUS | Status: DC

## 2014-05-04 MED ORDER — PROPOFOL 10 MG/ML IV BOLUS
INTRAVENOUS | Status: DC | PRN
  Administered 2014-05-04: 40 mg via INTRAVENOUS
  Administered 2014-05-04: 20 mg via INTRAVENOUS
  Administered 2014-05-04: 40 mg via INTRAVENOUS
  Administered 2014-05-04: 20 mg via INTRAVENOUS

## 2014-05-04 MED ORDER — FENTANYL CITRATE 0.05 MG/ML IJ SOLN
INTRAMUSCULAR | Status: DC | PRN
  Administered 2014-05-04 (×2): 50 ug via INTRAVENOUS

## 2014-05-04 SURGICAL SUPPLY — 24 items

## 2014-05-04 NOTE — Op Note (Signed)
Problems: Esophageal dysphagia. 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 Screening colonoscopy post treatment of acute diverticulitis  Endoscopist: Earle Gell  Premedication: Propofol administered by anesthesia  Procedure: Diagnostic esophagogastroduodenoscopy, esophageal balloon dilation of the benign stricture at the esophagogastric junction, application of an Endo Clip to stop bleeding at the esophagogastric The patient was placed in the left lateral decubitus position. The Pentax gastroscope was passed through the posterior hypopharynx into the proximal esophagus without difficulty. I did not visualize the vocal cords  Esophagoscopy: The proximal and mid segments of the esophageal mucosa appeared normal. The squamocolumnar junction was noted at 35 cm from the incisor teeth. There was no endoscopic evidence for the presence of Barrett's esophagus. There was a benign stricture extending less than 1 cm in length at the esophagogastric junction which was easily traversed with the gastroscope. The benign stricture at the esophagogastric junction was dilated from 15 mm to 16.5 mm using the esophageal balloon dilator. Post esophageal stricture dilation, a focal area of bleeding was hemostasis using the Endo Clip.  Gastroscopy: There was a moderate sized hiatal hernia with an isolated Cameron erosion 3 mm in diameter. Retroflex view of the gastric cardia and fundus was normal. The gastric body, antrum, and pylorus appeared normal.  Duodenoscopy: The duodenal bulb and descending duodenum appeared normal  Assessment:  1. Moderate sized hiatal hernia with small Cameron gastric erosion  #2. 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 Endo Clip applied to an area of mucosal bleeding at the esophagogastric junction post esophageal dilation  Procedure: Screening colonoscopy post treatment of acute  diverticulitis Anal inspection and digital rectal exam were normal. The Pentax pediatric colonoscope was introduced into the rectum and advanced to the cecum. A normal-appearing ileocecal valve and appendiceal orifice were identified. Colonic preparation for the exam today was good.  Rectum. Normal. Retroflex view of the distal rectum normal  Sigmoid colon and descending colon. Left colonic diverticulosis without signs of diverticulitis or diverticular stricture formation  Splenic flexure. Normal  Transverse colon. Normal  Hepatic flexure. A 3 mm sessile polyp was removed with the cold biopsy forceps and submitted for pathological interpretation  Ascending colon. Normal  Cecum and ileocecal valve. Normal  Assessment:  #1. From the hepatic flexure, a diminutive sessile polyp was removed with the cold biopsy forceps and submitted for pathological interpretation  #2. Left colonic diverticulosis  #3. Otherwise normal colonoscopy  Recommendation: If hepatic flexure polyp returns neoplastic pathologically, the patient should undergo a surveillance colonoscopy in 5 years. If the polyp returns non-neoplastic pathologically, the patient should undergo a repeat screening colonoscopy in 10 years

## 2014-05-04 NOTE — Transfer of Care (Signed)
Immediate Anesthesia Transfer of Care Note  Patient: Katie Weber  Procedure(s) Performed: Procedure(s): COLONOSCOPY WITH PROPOFOL (N/A) ESOPHAGOGASTRODUODENOSCOPY (EGD) WITH PROPOFOL (N/A) BALLOON DILATION (N/A)  Patient Location: PACU and Endoscopy Unit  Anesthesia Type:MAC  Level of Consciousness: awake, alert , oriented, patient cooperative and responds to stimulation  Airway & Oxygen Therapy: Patient Spontanous Breathing and Patient connected to nasal cannula oxygen  Post-op Assessment: Report given to PACU RN, Post -op Vital signs reviewed and stable and Patient moving all extremities  Post vital signs: Reviewed and stable  Complications: No apparent anesthesia complications

## 2014-05-04 NOTE — Anesthesia Preprocedure Evaluation (Addendum)
Anesthesia Evaluation  Patient identified by MRN, date of birth, ID band Patient awake    Reviewed: Allergy & Precautions, H&P , NPO status , Patient's Chart, lab work & pertinent test results, reviewed documented beta blocker date and time   History of Anesthesia Complications (+) Family history of anesthesia reaction  Airway Mallampati: II TM Distance: >3 FB Neck ROM: full    Dental no notable dental hx.    Pulmonary pneumonia -, resolved,  breath sounds clear to auscultation  Pulmonary exam normal       Cardiovascular negative cardio ROS  Rhythm:regular Rate:Normal     Neuro/Psych negative neurological ROS  negative psych ROS   GI/Hepatic Neg liver ROS, GERD-  Medicated,  Endo/Other  Hypothyroidism Morbid obesity  Renal/GU negative Renal ROS     Musculoskeletal negative musculoskeletal ROS (+)   Abdominal   Peds  Hematology negative hematology ROS (+)   Anesthesia Other Findings   Reproductive/Obstetrics negative OB ROS                           Anesthesia Physical  Anesthesia Plan  ASA: II  Anesthesia Plan: MAC   Post-op Pain Management:    Induction: Intravenous  Airway Management Planned:   Additional Equipment:   Intra-op Plan:   Post-operative Plan:   Informed Consent: I have reviewed the patients History and Physical, chart, labs and discussed the procedure including the risks, benefits and alternatives for the proposed anesthesia with the patient or authorized representative who has indicated his/her understanding and acceptance.   Dental advisory given  Plan Discussed with: CRNA  Anesthesia Plan Comments:        Anesthesia Quick Evaluation

## 2014-05-04 NOTE — H&P (Signed)
  Problems: Esophageal dysphagia. Resolved acute diverticulitis.  History: The patient is a 68 year old female born 10-10-1945. On 01/24/2014 the patient developed acute lower abdominal pain and was evaluated at the [facility] emergency room. She was diagnosed with acute diverticulitis and the patient tells me she has completed her antibiotic therapy.  Patient has never undergone colorectal cancer screening colonoscopically she denies a family history of colon cancer.  While living in [place], the patient developed esophageal dysphagia and underwent esophagogastroduodenoscopy with dilation of a probable esophageal stricture.  The patient has redeveloped solid food esophageal dysphagia.  The patient is scheduled to undergo diagnostic esophagogastroduodenoscopy with possible esophageal dilation and screening colonoscopy  Medication allergies: Penicillin  Past medical history: Tubal ligation. Lithotripsy for kidney stone. Asthma. Hypothyroidism.  Exam: The patient is alert and lying comfortably on the endoscopy stretcher. Lungs are clear to auscultation. Cardiac exam reveals a regular rhythm. Abdomen is soft nontender to palpation.  Plan: Proceed with diagnostic esophagogastroduodenoscopy with possible esophageal dilation and screening colonoscopy

## 2014-05-04 NOTE — Anesthesia Postprocedure Evaluation (Signed)
Anesthesia Post Note  Patient: Katie Weber  Procedure(s) Performed: Procedure(s) (LRB): COLONOSCOPY WITH PROPOFOL (N/A) ESOPHAGOGASTRODUODENOSCOPY (EGD) WITH PROPOFOL (N/A) BALLOON DILATION (N/A)  Anesthesia type: MAC  Patient location: PACU  Post pain: Pain level controlled  Post assessment: Post-op Vital signs reviewed  Last Vitals: BP 121/72  Pulse 60  Temp(Src) 36.7 C (Oral)  Resp 20  SpO2 99%  Post vital signs: Reviewed  Level of consciousness: awake  Complications: No apparent anesthesia complications

## 2014-05-04 NOTE — Discharge Instructions (Signed)
Colonoscopy, Care After °These instructions give you information on caring for yourself after your procedure. Your doctor may also give you more specific instructions. Call your doctor if you have any problems or questions after your procedure. °HOME CARE °· Do not drive for 24 hours. °· Do not sign important papers or use machinery for 24 hours. °· You may shower. °· You may go back to your usual activities, but go slower for the first 24 hours. °· Take rest breaks often during the first 24 hours. °· Walk around or use warm packs on your belly (abdomen) if you have belly cramping or gas. °· Drink enough fluids to keep your pee (urine) clear or pale yellow. °· Resume your normal diet. Avoid heavy or fried foods. °· Avoid drinking alcohol for 24 hours or as told by your doctor. °· Only take medicines as told by your doctor. °If a tissue sample (biopsy) was taken during the procedure:  °· Do not take aspirin or blood thinners for 7 days, or as told by your doctor. °· Do not drink alcohol for 7 days, or as told by your doctor. °· Eat soft foods for the first 24 hours. °GET HELP IF: °You still have a small amount of blood in your poop (stool) 2-3 days after the procedure. °GET HELP RIGHT AWAY IF: °· You have more than a small amount of blood in your poop. °· You see clumps of tissue (blood clots) in your poop. °· Your belly is puffy (swollen). °· You feel sick to your stomach (nauseous) or throw up (vomit). °· You have a fever. °· You have belly pain that gets worse and medicine does not help. °MAKE SURE YOU: °· Understand these instructions. °· Will watch your condition. °· Will get help right away if you are not doing well or get worse. °Document Released: 10/28/2010 Document Revised: 09/30/2013 Document Reviewed: 06/02/2013 °Exit[employer]® Patient Information ©2015 [employer]. This information is not intended to replace advice given to you by your health care provider. Make sure you discuss any questions you have with  your health care provider. °Esophagogastroduodenoscopy °Care After °Refer to this sheet in the next few weeks. These instructions provide you with information on caring for yourself after your procedure. Your caregiver may also give you more specific instructions. Your treatment has been planned according to current medical practices, but problems sometimes occur. Call your caregiver if you have any problems or questions after your procedure.  °HOME CARE INSTRUCTIONS °· Do not eat or drink anything until the numbing medicine (local anesthetic) has worn off and your gag reflex has returned. You will know that the local anesthetic has worn off when you can swallow comfortably. °· Do not drive for 12 hours after the procedure or as directed by your caregiver. °· Only take medicines as directed by your caregiver. °SEEK MEDICAL CARE IF:  °· You cannot stop coughing. °· You are not urinating at all or less than usual. °SEEK IMMEDIATE MEDICAL CARE IF: °· You have difficulty swallowing. °· You cannot eat or drink. °· You have worsening throat or chest pain. °· You have dizziness, lightheadedness, or you faint. °· You have nausea or vomiting. °· You have chills. °· You have a fever. °· You have severe abdominal pain. °· You have black, tarry, or bloody stools. °Document Released: 09/11/2012 Document Reviewed: 09/11/2012 °Exit[employer]® Patient Information ©2015 [employer]. This information is not intended to replace advice given to you by your health care provider. Make sure you   discuss any questions you have with your health care provider. ° °

## 2014-05-05 ENCOUNTER — Encounter (HOSPITAL_COMMUNITY): Payer: Self-pay | Admitting: Gastroenterology

## 2014-05-05 DIAGNOSIS — D126 Benign neoplasm of colon, unspecified: Secondary | ICD-10-CM | POA: Diagnosis not present

## 2014-05-05 DIAGNOSIS — R1310 Dysphagia, unspecified: Secondary | ICD-10-CM | POA: Diagnosis not present

## 2014-05-05 DIAGNOSIS — K5732 Diverticulitis of large intestine without perforation or abscess without bleeding: Secondary | ICD-10-CM | POA: Diagnosis not present

## 2014-05-13 DIAGNOSIS — D235 Other benign neoplasm of skin of trunk: Secondary | ICD-10-CM | POA: Diagnosis not present

## 2014-05-13 DIAGNOSIS — D485 Neoplasm of uncertain behavior of skin: Secondary | ICD-10-CM | POA: Diagnosis not present

## 2014-05-13 DIAGNOSIS — L905 Scar conditions and fibrosis of skin: Secondary | ICD-10-CM | POA: Diagnosis not present

## 2014-05-20 DIAGNOSIS — S22009A Unspecified fracture of unspecified thoracic vertebra, initial encounter for closed fracture: Secondary | ICD-10-CM | POA: Diagnosis not present

## 2014-06-17 DIAGNOSIS — S22009A Unspecified fracture of unspecified thoracic vertebra, initial encounter for closed fracture: Secondary | ICD-10-CM | POA: Diagnosis not present

## 2014-06-17 DIAGNOSIS — M899 Disorder of bone, unspecified: Secondary | ICD-10-CM | POA: Diagnosis not present

## 2014-06-17 DIAGNOSIS — M949 Disorder of cartilage, unspecified: Secondary | ICD-10-CM | POA: Diagnosis not present

## 2014-07-01 DIAGNOSIS — H2510 Age-related nuclear cataract, unspecified eye: Secondary | ICD-10-CM | POA: Diagnosis not present

## 2014-08-13 DIAGNOSIS — J029 Acute pharyngitis, unspecified: Secondary | ICD-10-CM | POA: Diagnosis not present

## 2014-08-19 DIAGNOSIS — J029 Acute pharyngitis, unspecified: Secondary | ICD-10-CM | POA: Diagnosis not present

## 2014-08-19 DIAGNOSIS — E039 Hypothyroidism, unspecified: Secondary | ICD-10-CM | POA: Diagnosis not present

## 2014-09-12 DIAGNOSIS — J00 Acute nasopharyngitis [common cold]: Secondary | ICD-10-CM | POA: Diagnosis not present

## 2014-09-12 DIAGNOSIS — J449 Chronic obstructive pulmonary disease, unspecified: Secondary | ICD-10-CM | POA: Diagnosis not present

## 2014-09-14 DIAGNOSIS — K449 Diaphragmatic hernia without obstruction or gangrene: Secondary | ICD-10-CM | POA: Diagnosis not present

## 2014-09-14 DIAGNOSIS — R0609 Other forms of dyspnea: Secondary | ICD-10-CM | POA: Diagnosis not present

## 2014-09-14 DIAGNOSIS — R062 Wheezing: Secondary | ICD-10-CM | POA: Diagnosis not present

## 2014-09-14 DIAGNOSIS — J8410 Pulmonary fibrosis, unspecified: Secondary | ICD-10-CM | POA: Diagnosis not present

## 2014-09-14 DIAGNOSIS — R05 Cough: Secondary | ICD-10-CM | POA: Diagnosis not present

## 2014-09-14 DIAGNOSIS — Z8719 Personal history of other diseases of the digestive system: Secondary | ICD-10-CM

## 2014-09-14 HISTORY — DX: Personal history of other diseases of the digestive system: Z87.19

## 2014-09-26 ENCOUNTER — Encounter (HOSPITAL_COMMUNITY): Payer: Self-pay | Admitting: Adult Health

## 2014-09-26 ENCOUNTER — Inpatient Hospital Stay (HOSPITAL_COMMUNITY)
Admission: EM | Admit: 2014-09-26 | Discharge: 2014-09-29 | DRG: 390 | Disposition: A | Discharge status: Home / Self Care | Payer: Medicare Other | Attending: Internal Medicine | Admitting: Internal Medicine

## 2014-09-26 DIAGNOSIS — K56600 Partial intestinal obstruction, unspecified as to cause: Secondary | ICD-10-CM

## 2014-09-26 DIAGNOSIS — R059 Cough, unspecified: Secondary | ICD-10-CM

## 2014-09-26 DIAGNOSIS — R52 Pain, unspecified: Secondary | ICD-10-CM

## 2014-09-26 DIAGNOSIS — R1013 Epigastric pain: Secondary | ICD-10-CM | POA: Diagnosis present

## 2014-09-26 DIAGNOSIS — K56609 Unspecified intestinal obstruction, unspecified as to partial versus complete obstruction: Secondary | ICD-10-CM

## 2014-09-26 DIAGNOSIS — Z6837 Body mass index (BMI) 37.0-37.9, adult: Secondary | ICD-10-CM

## 2014-09-26 DIAGNOSIS — R109 Unspecified abdominal pain: Secondary | ICD-10-CM

## 2014-09-26 DIAGNOSIS — D72829 Elevated white blood cell count, unspecified: Secondary | ICD-10-CM | POA: Diagnosis present

## 2014-09-26 DIAGNOSIS — K2970 Gastritis, unspecified, without bleeding: Secondary | ICD-10-CM

## 2014-09-26 DIAGNOSIS — Z79899 Other long term (current) drug therapy: Secondary | ICD-10-CM

## 2014-09-26 DIAGNOSIS — K449 Diaphragmatic hernia without obstruction or gangrene: Secondary | ICD-10-CM | POA: Diagnosis not present

## 2014-09-26 DIAGNOSIS — M1990 Unspecified osteoarthritis, unspecified site: Secondary | ICD-10-CM | POA: Diagnosis present

## 2014-09-26 DIAGNOSIS — K219 Gastro-esophageal reflux disease without esophagitis: Secondary | ICD-10-CM | POA: Diagnosis present

## 2014-09-26 DIAGNOSIS — R739 Hyperglycemia, unspecified: Secondary | ICD-10-CM | POA: Diagnosis not present

## 2014-09-26 DIAGNOSIS — R0602 Shortness of breath: Secondary | ICD-10-CM

## 2014-09-26 DIAGNOSIS — R05 Cough: Secondary | ICD-10-CM

## 2014-09-26 DIAGNOSIS — R112 Nausea with vomiting, unspecified: Secondary | ICD-10-CM | POA: Diagnosis present

## 2014-09-26 DIAGNOSIS — K5669 Other intestinal obstruction: Secondary | ICD-10-CM | POA: Diagnosis not present

## 2014-09-26 DIAGNOSIS — E039 Hypothyroidism, unspecified: Secondary | ICD-10-CM | POA: Diagnosis present

## 2014-09-26 DIAGNOSIS — J209 Acute bronchitis, unspecified: Secondary | ICD-10-CM | POA: Diagnosis present

## 2014-09-26 DIAGNOSIS — E669 Obesity, unspecified: Secondary | ICD-10-CM | POA: Diagnosis present

## 2014-09-26 DIAGNOSIS — N200 Calculus of kidney: Secondary | ICD-10-CM | POA: Diagnosis not present

## 2014-09-26 LAB — CBC WITH DIFFERENTIAL/PLATELET
Basophils Absolute: 0 10*3/uL (ref 0.0–0.1)
Basophils Relative: 0 % (ref 0–1)
EOS ABS: 0.1 10*3/uL (ref 0.0–0.7)
EOS PCT: 1 % (ref 0–5)
HCT: 44.5 % (ref 36.0–46.0)
Hemoglobin: 14.6 g/dL (ref 12.0–15.0)
Lymphocytes Relative: 10 % — ABNORMAL LOW (ref 12–46)
Lymphs Abs: 1.7 10*3/uL (ref 0.7–4.0)
MCH: 30.3 pg (ref 26.0–34.0)
MCHC: 32.8 g/dL (ref 30.0–36.0)
MCV: 92.3 fL (ref 78.0–100.0)
MONOS PCT: 4 % (ref 3–12)
Monocytes Absolute: 0.7 10*3/uL (ref 0.1–1.0)
NEUTROS PCT: 85 % — AB (ref 43–77)
Neutro Abs: 15.1 10*3/uL — ABNORMAL HIGH (ref 1.7–7.7)
PLATELETS: 265 10*3/uL (ref 150–400)
RBC: 4.82 MIL/uL (ref 3.87–5.11)
RDW: 13.8 % (ref 11.5–15.5)
WBC: 17.6 10*3/uL — ABNORMAL HIGH (ref 4.0–10.5)

## 2014-09-26 LAB — COMPREHENSIVE METABOLIC PANEL
ALK PHOS: 91 U/L (ref 39–117)
ALT: 28 U/L (ref 0–35)
ANION GAP: 12 (ref 5–15)
AST: 22 U/L (ref 0–37)
Albumin: 3.7 g/dL (ref 3.5–5.2)
BUN: 12 mg/dL (ref 6–23)
CALCIUM: 9.8 mg/dL (ref 8.4–10.5)
CO2: 27 mEq/L (ref 19–32)
Chloride: 98 mEq/L (ref 96–112)
Creatinine, Ser: 0.88 mg/dL (ref 0.50–1.10)
GFR calc non Af Amer: 66 mL/min — ABNORMAL LOW (ref >90)
GFR, EST AFRICAN AMERICAN: 76 mL/min — AB (ref >90)
GLUCOSE: 150 mg/dL — AB (ref 70–99)
Potassium: 4.5 mEq/L (ref 3.7–5.3)
SODIUM: 137 meq/L (ref 137–147)
TOTAL PROTEIN: 7.4 g/dL (ref 6.0–8.3)
Total Bilirubin: 0.5 mg/dL (ref 0.3–1.2)

## 2014-09-26 LAB — LIPASE, BLOOD: LIPASE: 40 U/L (ref 11–59)

## 2014-09-26 MED ORDER — RANITIDINE HCL 150 MG PO CAPS: 150.0000 mg | ORAL_CAPSULE | Freq: Two times a day (BID) | ORAL | Status: DC

## 2014-09-26 MED ORDER — SODIUM CHLORIDE 0.9 % IV BOLUS (SEPSIS)
1000.0000 mL | Freq: Once | INTRAVENOUS | Status: AC
Start: 2014-09-26 — End: 2014-09-27
  Administered 2014-09-26: 1000 mL via INTRAVENOUS

## 2014-09-26 MED ORDER — ONDANSETRON HCL 4 MG/2ML IJ SOLN
4.0000 mg | Freq: Once | INTRAMUSCULAR | Status: AC
  Administered 2014-09-26: 4 mg via INTRAVENOUS
  Filled 2014-09-26: qty 2

## 2014-09-26 MED ORDER — RANITIDINE HCL 150 MG/10ML PO SYRP
150.0000 mg | ORAL_SOLUTION | Freq: Once | ORAL | Status: AC
  Administered 2014-09-26: 150 mg via ORAL
  Filled 2014-09-26: qty 10

## 2014-09-26 MED ORDER — ALUM & MAG HYDROXIDE-SIMETH 200-200-20 MG/5ML PO SUSP
30.0000 mL | Freq: Once | ORAL | Status: AC
  Administered 2014-09-26: 30 mL via ORAL
  Filled 2014-09-26: qty 30

## 2014-09-26 NOTE — ED Notes (Signed)
Presents with this am having lower abdominal and vaginal cramping, then diarrhea and nausea and vomiting this evening after eating zaxbys salad and ice cream. C/o generalized abdominalpain and distention. Vomited x 3 times. Feels chills, deneis fever.

## 2014-09-26 NOTE — Discharge Instructions (Signed)
Abdominal Pain Katie Weber, seen today for abdominal pain. This is likely due to stomach acid from prednisone. Continue to take Zantac as prescribed and follow-up with your primary care physician within 3 days. If symptoms worsen, including worsening pain or fever, come back to the emergency department and medially for repeat evaluation. Many things can cause belly (abdominal) pain. Most times, the belly pain is not dangerous. Many cases of belly pain can be watched and treated at home. HOME CARE   Do not take medicines that help you go poop (laxatives) unless told to by your doctor.  Only take medicine as told by your doctor.  Eat or drink as told by your doctor. Your doctor will tell you if you should be on a special diet. GET HELP IF:  You do not know what is causing your belly pain.  You have belly pain while you are sick to your stomach (nauseous) or have runny poop (diarrhea).  You have pain while you pee or poop.  Your belly pain wakes you up at night.  You have belly pain that gets worse or better when you eat.  You have belly pain that gets worse when you eat fatty foods.  You have a fever. GET HELP RIGHT AWAY IF:   The pain does not go away within 2 hours.  You keep throwing up (vomiting).  The pain changes and is only in the right or left part of the belly.  You have bloody or tarry looking poop. MAKE SURE YOU:   Understand these instructions.  Will watch your condition.  Will get help right away if you are not doing well or get worse. Document Released: 03/13/2008 Document Revised: 09/30/2013 Document Reviewed: 06/04/2013 [facility] Patient Information 2015 [place], [place]. This information is not intended to replace advice given to you by your health care provider. Make sure you discuss any questions you have with your health care provider.

## 2014-09-26 NOTE — ED Provider Notes (Signed)
CSN: [id number]     Arrival date & time 09/26/14  2224 History   None    Chief Complaint  Patient presents with  . Abdominal Pain     (Consider location/radiation/quality/duration/timing/severity/associated sxs/prior Treatment) HPI Complaint of abdominal pain starting at epigastrium and migrated to the periumbilical area onset 5:36 PM today crampy in nature, waxes and wanes. She's vomited 3 times since onset of pain and had 3 loose bowel movements no hematemesis no blood per rectum no fever. She treated herself with one dose of generic Maalox, without relief. She states her abdomen was distended earlier today however no longer feels distended. She feels improved at present. She was nauseated upon arrival here but nausea improved after was treated with Zofran per nursing protocol. Pain is improved with vomiting or with bowel movement. Not made worse by anything pain is mild at present. No other associated symptoms Past Medical History  Diagnosis Date  . Hypothyroidism   . Pneumonia 2014  . GERD (gastroesophageal reflux disease)     no meds  . Diverticulitis     no problems now  . 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  . Family history of anesthesia complication 14ERX ago    allergic reaction to atropine  . History of kidney stones     '80's lithotripsy-[facility] x1  . Arthritis     arthritis fingers   Past Surgical History  Procedure Laterality Date  . Lithotripsy    . Tubal ligation    . Hysteroscopy w/d&c N/A 04/20/2014    Procedure: DILATATION AND CURETTAGE /HYSTEROSCOPY;  Surgeon: [name], MD;  Location: [facility];  Service: Gynecology;  Laterality: N/A;  . Dilation and curettage of uterus      04-20-14 [facility] hospital endometrial polyp removed.  . Breast surgery Right     '72 -benign tumor  . Childbirth- nvd x2     . Colonoscopy with propofol N/A 05/04/2014    Procedure: COLONOSCOPY WITH PROPOFOL;  Surgeon: Garlan Fair, MD;  Location: [facility];  Service: Endoscopy;  Laterality: N/A;  . Esophagogastroduodenoscopy (egd) with propofol N/A 05/04/2014    Procedure: ESOPHAGOGASTRODUODENOSCOPY (EGD) WITH PROPOFOL;  Surgeon: Garlan Fair, MD;  Location: [facility];  Service: Endoscopy;  Laterality: N/A;  . Balloon dilation N/A 05/04/2014    Procedure: BALLOON DILATION;  Surgeon: Garlan Fair, MD;  Location: [facility];  Service: Endoscopy;  Laterality: N/A;   History reviewed. No pertinent family history. History  Substance Use Topics  . Smoking status: Never Smoker   . Smokeless tobacco: Not on file  . Alcohol Use: Yes     Comment: rare social   OB History    No data available     Review of Systems  Constitutional: Negative.   HENT: Negative.   Respiratory: Negative.   Cardiovascular: Negative.   Gastrointestinal: Positive for nausea, vomiting and abdominal pain.  Musculoskeletal: Negative.   Skin: Negative.   Neurological: Negative.   Psychiatric/Behavioral: Negative.   All other systems reviewed and are negative.     Allergies  Penicillins  Home Medications   Prior to Admission medications   Medication Sig Start Date End Date Taking? Authorizing Provider  albuterol (PROVENTIL HFA;VENTOLIN HFA) 108 (90 BASE) MCG/ACT inhaler Inhale 2 puffs into the lungs every 6 (six) hours as needed for wheezing or shortness of breath (seasonal allergies).   Yes Historical Provider, MD  fluticasone (FLONASE) 50  MCG/ACT nasal spray Place 1 spray into both nostrils daily as needed for allergies or rhinitis (seasonal allergies).   Yes Historical Provider, MD  levothyroxine (SYNTHROID, LEVOTHROID) 100 MCG tablet Take 100 mcg by mouth daily before breakfast.   Yes Historical Provider, MD  loratadine (CLARITIN) 10 MG tablet Take 10 mg by mouth daily as needed for allergies (seasonal allergies).   Yes Historical Provider, MD  omeprazole (PRILOSEC) 20 MG capsule Take 20 mg by mouth 2 (two) times  daily before a meal.  09/14/14  Yes Historical Provider, MD  tretinoin (RETIN-A) 0.025 % gel Apply 1 application topically every other day. Takes at bedtime   Yes Historical Provider, MD  ibuprofen (ADVIL,MOTRIN) 800 MG tablet Take 1 tablet (800 mg total) by mouth every 8 (eight) hours as needed for cramping. Patient not taking: Reported on 09/26/2014 04/20/14   [name], MD  oxyCODONE-acetaminophen (ROXICET) 5-325 MG per tablet Take 1-2 tablets by mouth every 4 (four) hours as needed for severe pain. Patient not taking: Reported on 09/26/2014 04/20/14   [name], MD   BP 148/74 mmHg  Pulse 72  Temp(Src) 98.3 F (36.8 C) (Oral)  Resp 16  SpO2 97% Physical Exam  Constitutional: She appears well-developed and well-nourished.  HENT:  Head: Normocephalic and atraumatic.  Eyes: Conjunctivae are normal. Pupils are equal, round, and reactive to light.  Neck: Neck supple. No tracheal deviation present. No thyromegaly present.  Cardiovascular: Normal rate and regular rhythm.   No murmur heard. Pulmonary/Chest: Effort normal and breath sounds normal.  Abdominal: Soft. Bowel sounds are normal. She exhibits no distension. There is tenderness.  Minimally tender at epigastrium  Musculoskeletal: Normal range of motion. She exhibits no edema or tenderness.  Neurological: She is alert. Coordination normal.  Skin: Skin is warm and dry. No rash noted.  Psychiatric: She has a normal mood and affect.  Nursing note and vitals reviewed.   ED Course  Procedures (including critical care time) Labs Review Labs Reviewed  CBC WITH DIFFERENTIAL - Abnormal; Notable for the following:    WBC 17.6 (*)    Neutrophils Relative % 85 (*)    Neutro Abs 15.1 (*)    Lymphocytes Relative 10 (*)    All other components within normal limits  COMPREHENSIVE METABOLIC PANEL  LIPASE, BLOOD    Imaging Review No results found.   EKG Interpretation None     Results for orders placed or performed during the  hospital encounter of 09/26/14  CBC with Differential  Result Value Ref Range   WBC 17.6 (H) 4.0 - 10.5 K/uL   RBC 4.82 3.87 - 5.11 MIL/uL   Hemoglobin 14.6 12.0 - 15.0 g/dL   HCT 44.5 36.0 - 46.0 %   MCV 92.3 78.0 - 100.0 fL   MCH 30.3 26.0 - 34.0 pg   MCHC 32.8 30.0 - 36.0 g/dL   RDW 13.8 11.5 - 15.5 %   Platelets 265 150 - 400 K/uL   Neutrophils Relative % 85 (H) 43 - 77 %   Neutro Abs 15.1 (H) 1.7 - 7.7 K/uL   Lymphocytes Relative 10 (L) 12 - 46 %   Lymphs Abs 1.7 0.7 - 4.0 K/uL   Monocytes Relative 4 3 - 12 %   Monocytes Absolute 0.7 0.1 - 1.0 K/uL   Eosinophils Relative 1 0 - 5 %   Eosinophils Absolute 0.1 0.0 - 0.7 K/uL   Basophils Relative 0 0 - 1 %   Basophils Absolute 0.0 0.0 - 0.1  K/uL  Comprehensive metabolic panel  Result Value Ref Range   Sodium 137 137 - 147 mEq/L   Potassium 4.5 3.7 - 5.3 mEq/L   Chloride 98 96 - 112 mEq/L   CO2 27 19 - 32 mEq/L   Glucose, Bld 150 (H) 70 - 99 mg/dL   BUN 12 6 - 23 mg/dL   Creatinine, Ser 0.88 0.50 - 1.10 mg/dL   Calcium 9.8 8.4 - 10.5 mg/dL   Total Protein 7.4 6.0 - 8.3 g/dL   Albumin 3.7 3.5 - 5.2 g/dL   AST 22 0 - 37 U/L   ALT 28 0 - 35 U/L   Alkaline Phosphatase 91 39 - 117 U/L   Total Bilirubin 0.5 0.3 - 1.2 mg/dL   GFR calc non Af Amer 66 (L) >90 mL/min   GFR calc Af Amer 76 (L) >90 mL/min   Anion gap 12 5 - 15  Lipase, blood  Result Value Ref Range   Lipase 40 11 - 59 U/L   No results found.  MDM  Patient signed out to Dr.Oni 11:20 PM Final diagnoses:  None   diagnosis #1 abdominal pain #2 vomiting #3hyperglycemia      Orlie Dakin, MD 09/26/14 [id number]

## 2014-09-26 NOTE — ED Provider Notes (Signed)
Patient presents out of concern for midepigastric abdominal pain, which is burning in sensation radiating up into her esophagus. Patient states this is consistent with her last also she had 20 years ago. Her only new medications include prednisone for the past 12 days, this could have caused her to have worsening acid reflux. She was given Zofran, ranitidine, Maalox emergency department. Upon my repeat evaluation, the patient states her symptoms have vastly improved.    Upon repeat evaluation, patient states her symptoms have returned. Repeat abdominal exam reveals abdominal tenderness with slight rebound. I think CT scan of the abdomen is necessary to evaluate for any intra-abdominal pathology as a cause for this pain. Patient was given morphine as well.  CT scan reveals hiatal hernia with early small bowel obstruction. Upon repeat assessment the patient again, she continues to have intermittent pain and nausea. Because of her symptoms she'll be admitted to [facility] floor.  Patient is amenable with this plan, her vital signs were within her normal limits.  Everlene Balls, MD 09/27/14 [phone]

## 2014-09-26 NOTE — ED Notes (Signed)
EDP at bedside  

## 2014-09-27 ENCOUNTER — Encounter (HOSPITAL_COMMUNITY): Payer: Self-pay | Admitting: Radiology

## 2014-09-27 ENCOUNTER — Inpatient Hospital Stay (HOSPITAL_COMMUNITY): Payer: Medicare Other

## 2014-09-27 ENCOUNTER — Emergency Department (HOSPITAL_COMMUNITY): Payer: Medicare Other

## 2014-09-27 DIAGNOSIS — R109 Unspecified abdominal pain: Secondary | ICD-10-CM | POA: Diagnosis not present

## 2014-09-27 DIAGNOSIS — J9811 Atelectasis: Secondary | ICD-10-CM | POA: Diagnosis not present

## 2014-09-27 DIAGNOSIS — K5669 Other intestinal obstruction: Secondary | ICD-10-CM | POA: Diagnosis not present

## 2014-09-27 DIAGNOSIS — J209 Acute bronchitis, unspecified: Secondary | ICD-10-CM | POA: Diagnosis not present

## 2014-09-27 DIAGNOSIS — K5660 Unspecified intestinal obstruction: Secondary | ICD-10-CM | POA: Diagnosis not present

## 2014-09-27 DIAGNOSIS — Z6837 Body mass index (BMI) 37.0-37.9, adult: Secondary | ICD-10-CM | POA: Diagnosis not present

## 2014-09-27 DIAGNOSIS — Z79899 Other long term (current) drug therapy: Secondary | ICD-10-CM | POA: Diagnosis not present

## 2014-09-27 DIAGNOSIS — R1110 Vomiting, unspecified: Secondary | ICD-10-CM

## 2014-09-27 DIAGNOSIS — K449 Diaphragmatic hernia without obstruction or gangrene: Secondary | ICD-10-CM | POA: Diagnosis not present

## 2014-09-27 DIAGNOSIS — K219 Gastro-esophageal reflux disease without esophagitis: Secondary | ICD-10-CM | POA: Diagnosis present

## 2014-09-27 DIAGNOSIS — E039 Hypothyroidism, unspecified: Secondary | ICD-10-CM | POA: Diagnosis not present

## 2014-09-27 DIAGNOSIS — K56609 Unspecified intestinal obstruction, unspecified as to partial versus complete obstruction: Secondary | ICD-10-CM | POA: Diagnosis present

## 2014-09-27 DIAGNOSIS — M1990 Unspecified osteoarthritis, unspecified site: Secondary | ICD-10-CM | POA: Diagnosis present

## 2014-09-27 DIAGNOSIS — E669 Obesity, unspecified: Secondary | ICD-10-CM | POA: Diagnosis present

## 2014-09-27 DIAGNOSIS — R062 Wheezing: Secondary | ICD-10-CM | POA: Diagnosis not present

## 2014-09-27 DIAGNOSIS — R112 Nausea with vomiting, unspecified: Secondary | ICD-10-CM | POA: Diagnosis not present

## 2014-09-27 DIAGNOSIS — R1013 Epigastric pain: Secondary | ICD-10-CM

## 2014-09-27 DIAGNOSIS — D72829 Elevated white blood cell count, unspecified: Secondary | ICD-10-CM | POA: Diagnosis not present

## 2014-09-27 DIAGNOSIS — N200 Calculus of kidney: Secondary | ICD-10-CM | POA: Diagnosis not present

## 2014-09-27 DIAGNOSIS — R05 Cough: Secondary | ICD-10-CM | POA: Diagnosis not present

## 2014-09-27 LAB — CBC
HEMATOCRIT: 42.9 % (ref 36.0–46.0)
HEMOGLOBIN: 13.9 g/dL (ref 12.0–15.0)
MCH: 30 pg (ref 26.0–34.0)
MCHC: 32.4 g/dL (ref 30.0–36.0)
MCV: 92.5 fL (ref 78.0–100.0)
Platelets: 293 10*3/uL (ref 150–400)
RBC: 4.64 MIL/uL (ref 3.87–5.11)
RDW: 14.1 % (ref 11.5–15.5)
WBC: 15.8 10*3/uL — ABNORMAL HIGH (ref 4.0–10.5)

## 2014-09-27 LAB — BASIC METABOLIC PANEL
Anion gap: 13 (ref 5–15)
BUN: 10 mg/dL (ref 6–23)
CO2: 23 meq/L (ref 19–32)
CREATININE: 0.77 mg/dL (ref 0.50–1.10)
Calcium: 9.4 mg/dL (ref 8.4–10.5)
Chloride: 102 mEq/L (ref 96–112)
GFR calc Af Amer: >90 mL/min (ref >90)
GFR calc non Af Amer: 84 mL/min — ABNORMAL LOW (ref >90)
GLUCOSE: 113 mg/dL — AB (ref 70–99)
Potassium: 4.4 mEq/L (ref 3.7–5.3)
Sodium: 138 mEq/L (ref 137–147)

## 2014-09-27 IMAGING — CR DG ABDOMEN ACUTE W/ 1V CHEST
3 series · 3 of 3 positions shown · non-contrast
Comparison: Chest radiograph [DATE].

CLINICAL DATA: Epigastric pain.

EXAM:
ACUTE ABDOMEN SERIES (ABDOMEN 2 VIEW & CHEST 1 VIEW)

[chest pa]
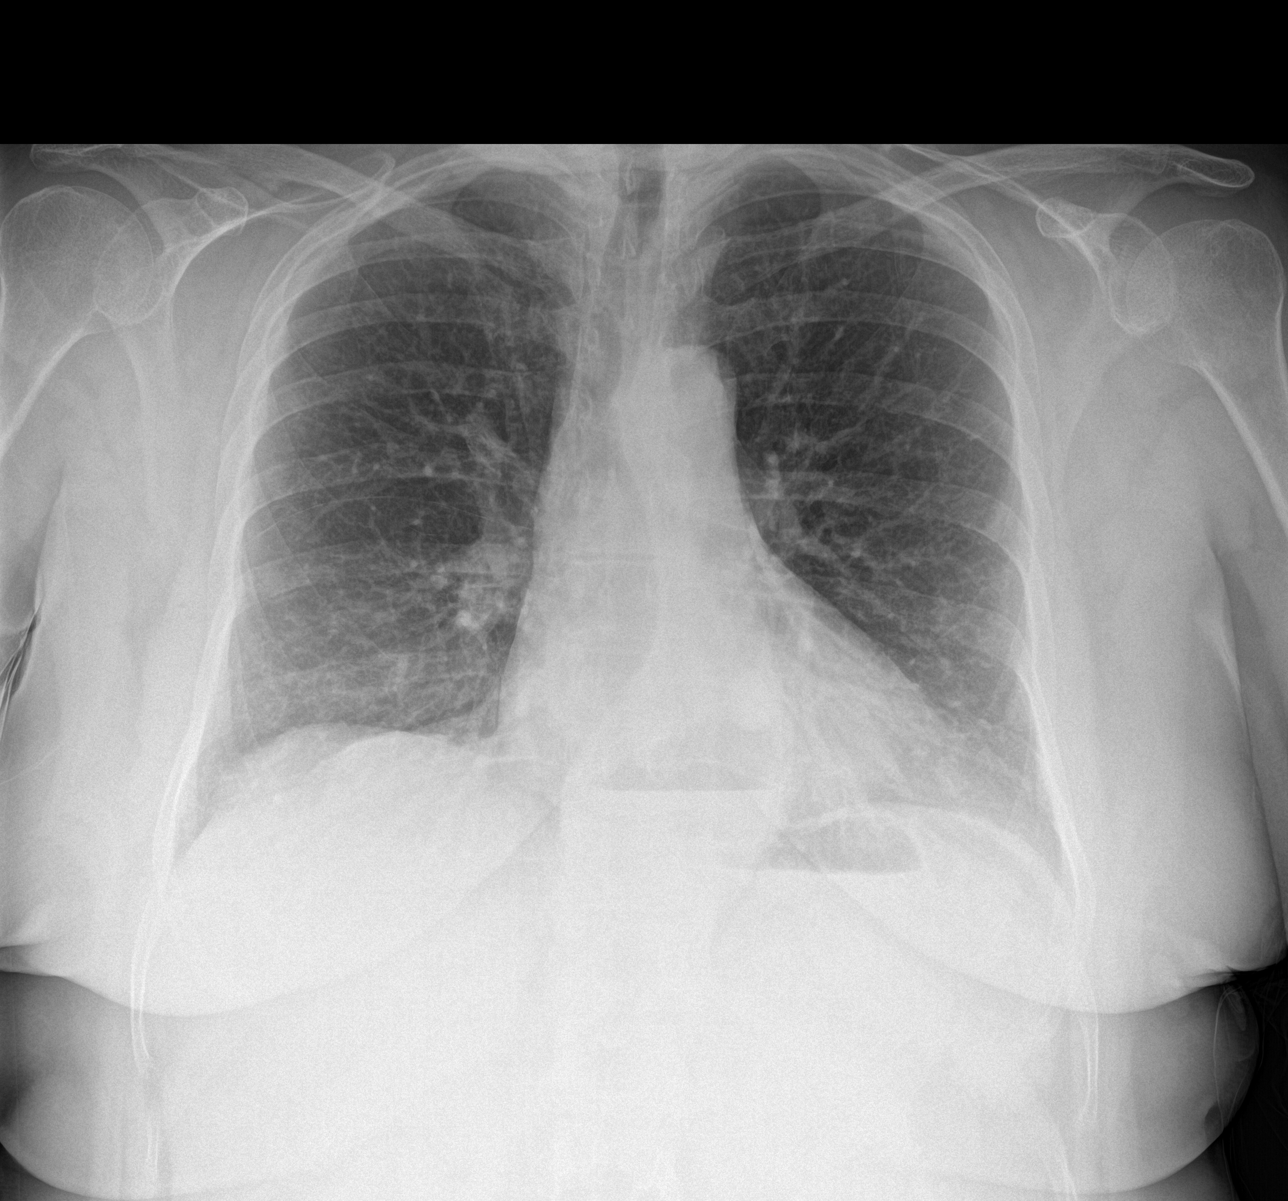

[abdomen erect]
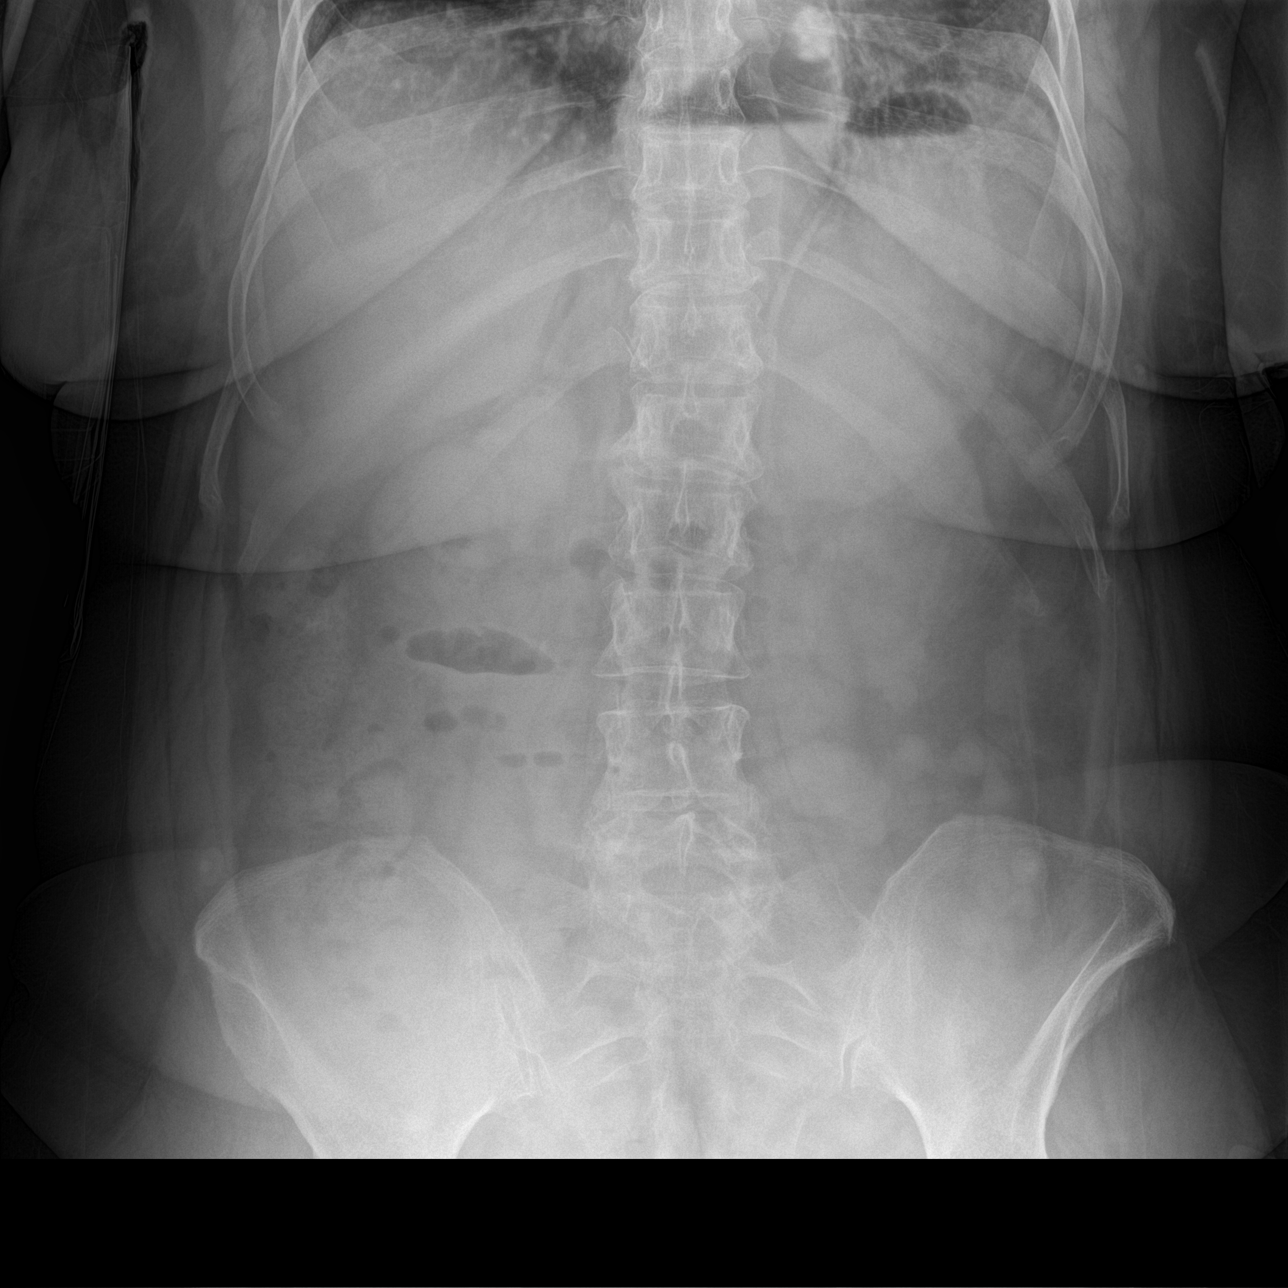

[abdomen supine]
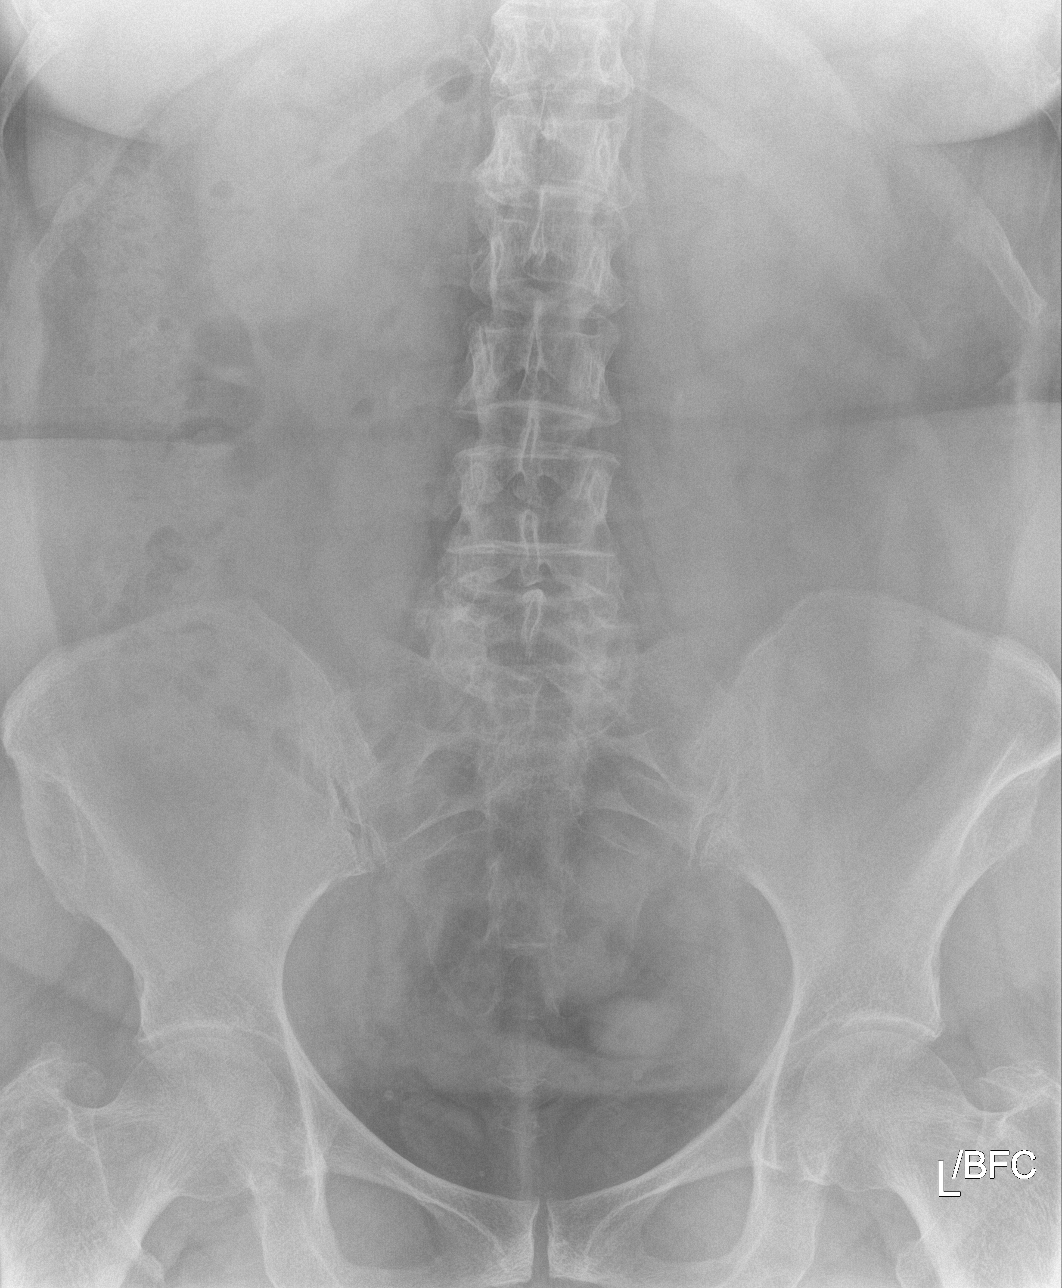

[3 of 3 positions shown; findings below may reference images not displayed]

FINDINGS: There is no evidence of dilated bowel loops or free intraperitoneal
air. No radiopaque calculi or other significant radiographic
abnormality is seen. Heart size and mediastinal contours are within
normal limits. Both lungs are clear.

Moderate-sized hiatal hernia with air-fluid level is redemonstrated.
Tortuous aorta.
IMPRESSION: Negative abdominal radiographs.  No acute cardiopulmonary disease.

Moderate-sized hiatal hernia with air-fluid level. Correlate
clinically for relation of this finding to epigastric pain

## 2014-09-27 IMAGING — DX DG CHEST 2V
2 series · 2 of 2 positions shown · non-contrast
Comparison: CT abdomen pelvis [DATE]

CLINICAL DATA: Cough

EXAM:
CHEST  2 VIEW

[chest lat]
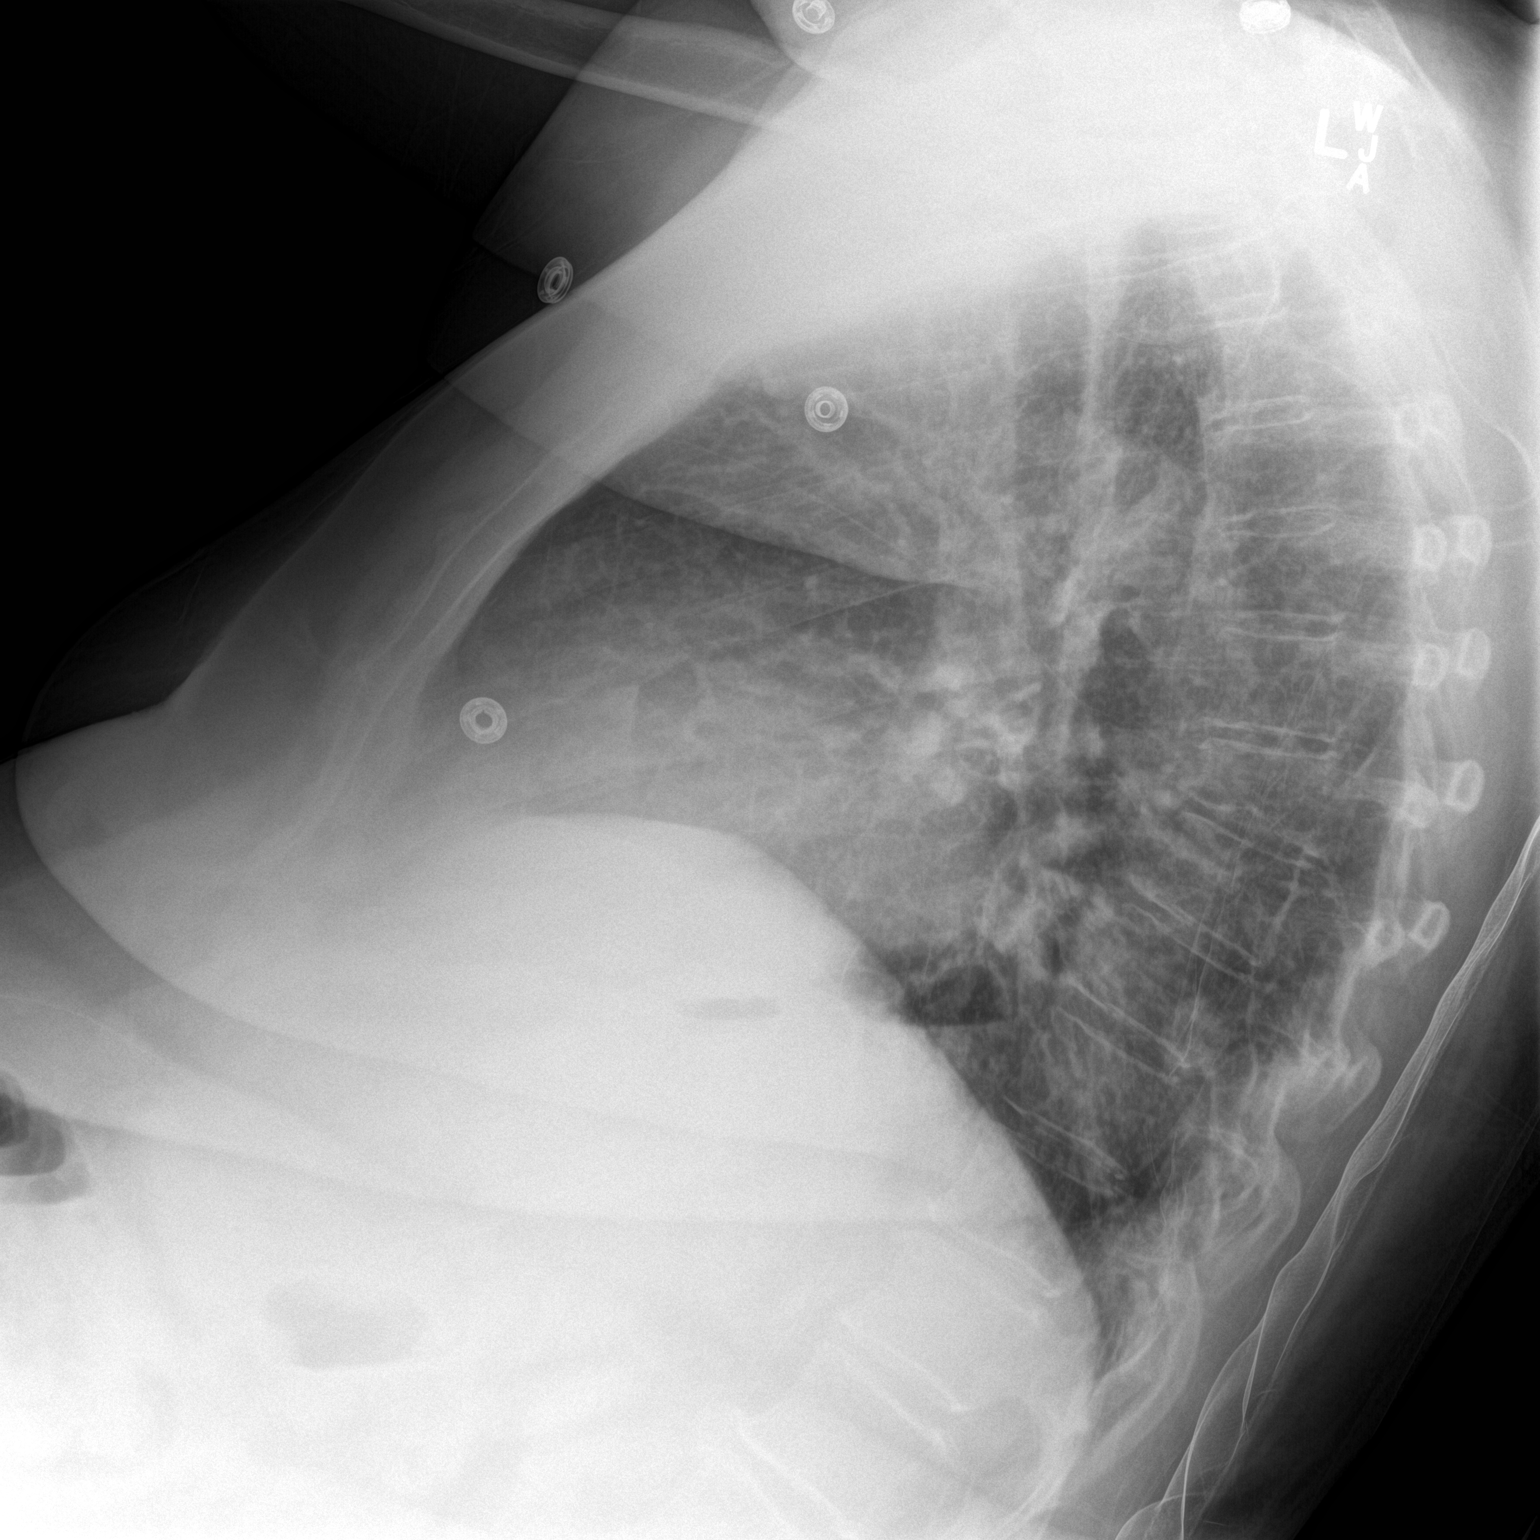

[chest ap]
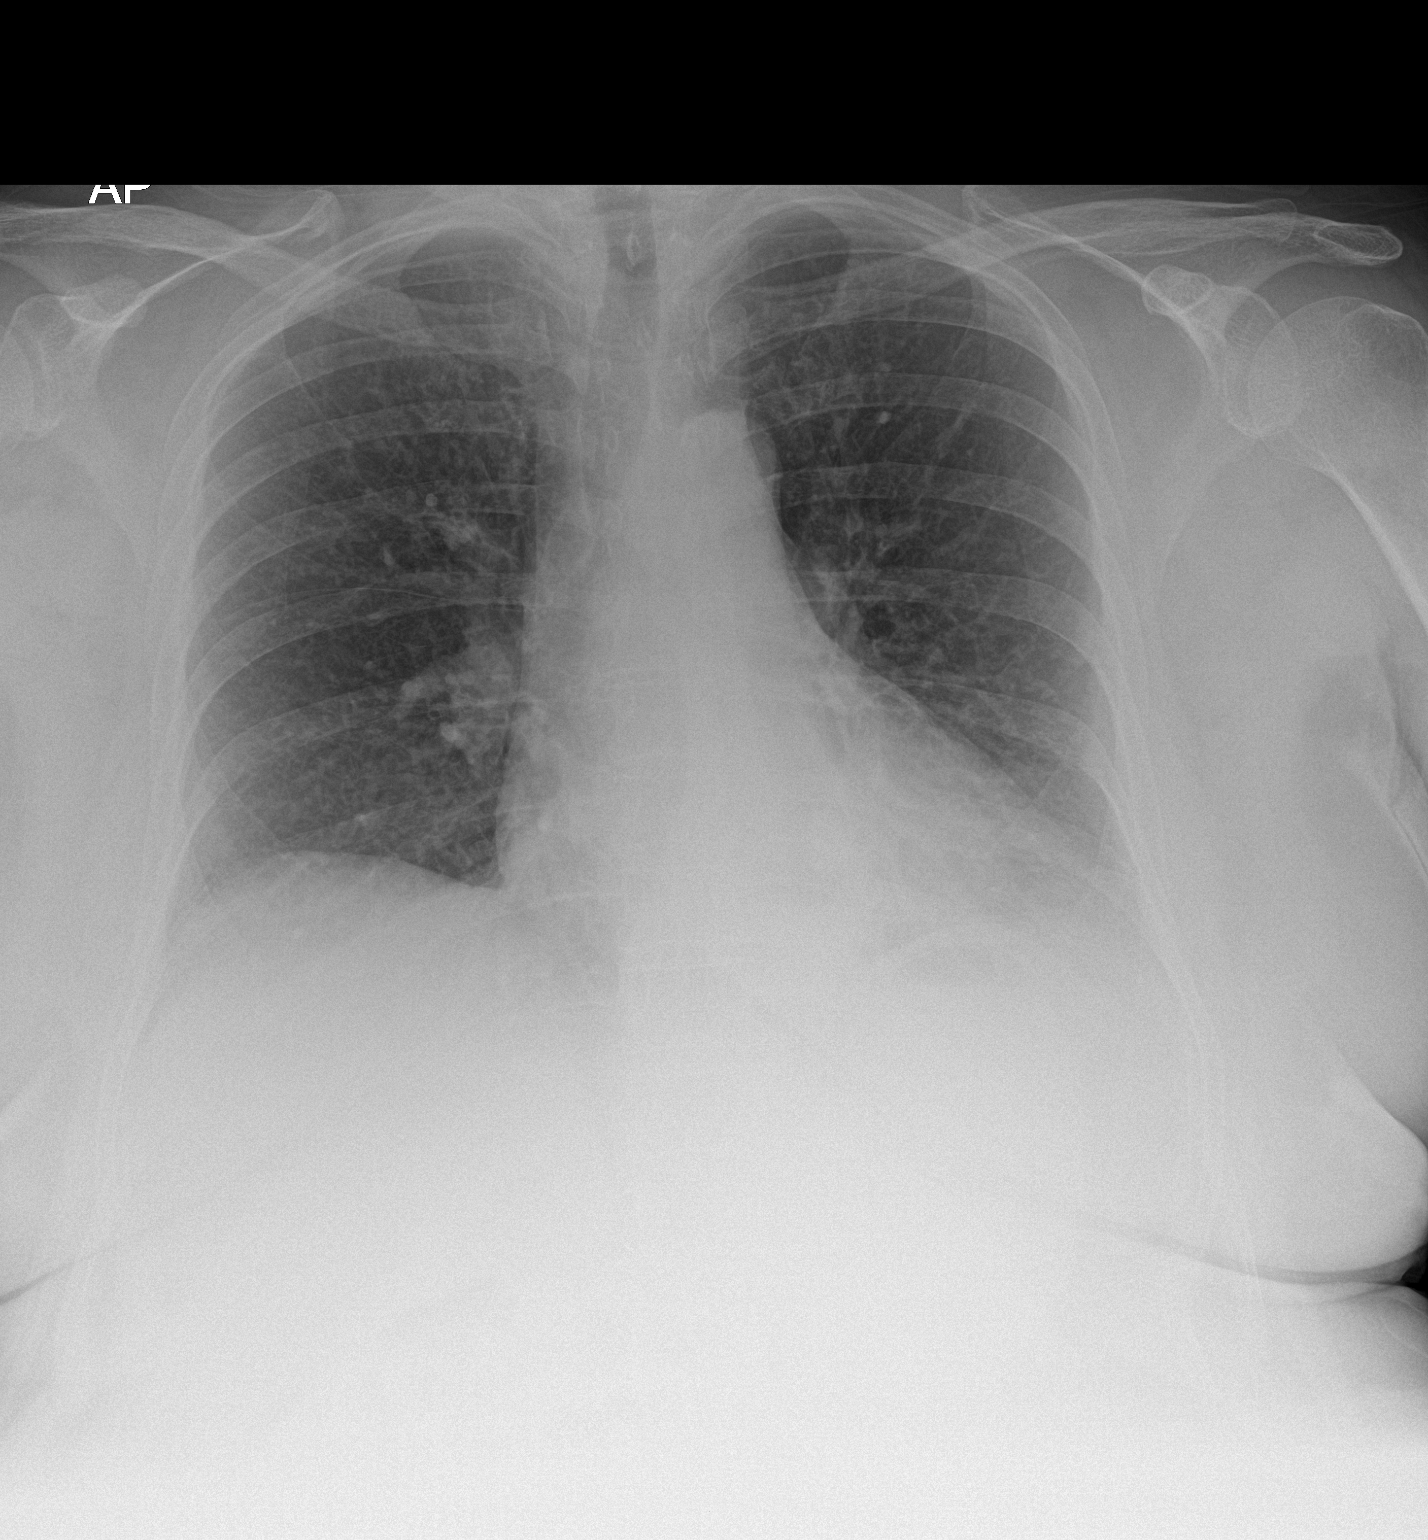

[2 of 2 positions shown; findings below may reference images not displayed]

FINDINGS: Normal cardiac silhouette. Mild basilar atelectasis. No focal
infiltrate. There is a calcified granuloma noted in the left lung
base measuring 19 mm.
IMPRESSION: Bibasilar atelectasis.  No evidence for infiltrate.

## 2014-09-27 IMAGING — CT CT ABD-PELV W/O CM
2 of 4 series · 16 of 46 positions shown, 18 images · non-contrast
Comparison: Prior study performed earlier the same day.

CLINICAL DATA: Initial evaluation for acute mid upper abdominal
pain. History of kidney stones, diverticulitis, gastroesophageal
reflux.

EXAM:
CT ABDOMEN AND PELVIS WITHOUT CONTRAST
TECHNIQUE: Multidetector CT imaging of the abdomen and pelvis was performed
following the standard protocol without IV contrast.

[Series 2: abd/ pelvis 5.0 i30f 1 · axial · 0.94mm/px · z∈[-982,-552]mm · 13 of 94 slices shown, 15 images]
[im 4/94  soft-tissue]
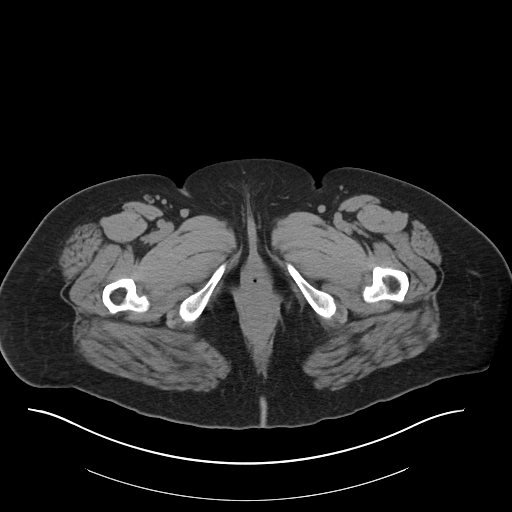
[im 4/94  bone]
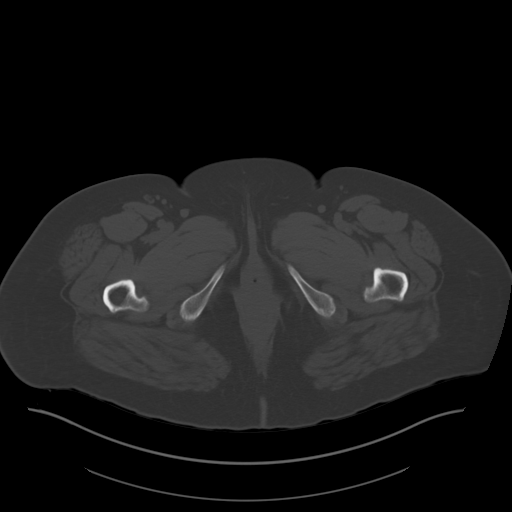
[im 12/94  soft-tissue]
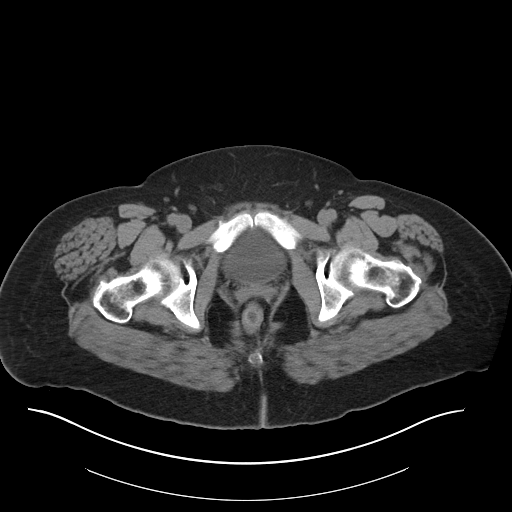
[im 20/94  soft-tissue]
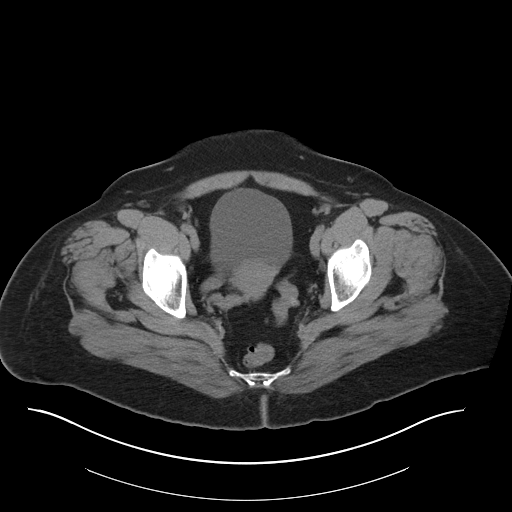
[im 28/94  soft-tissue]
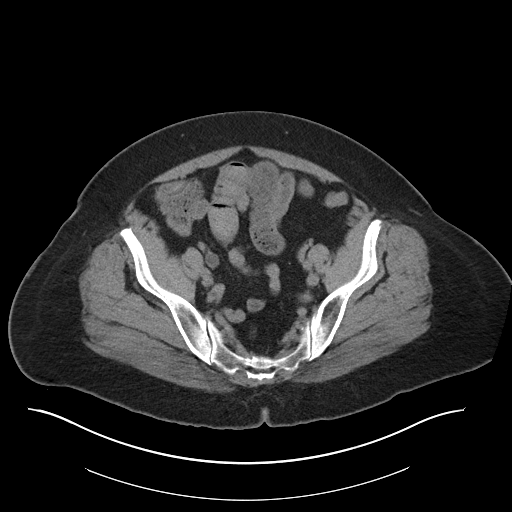
[im 32/94  soft-tissue]
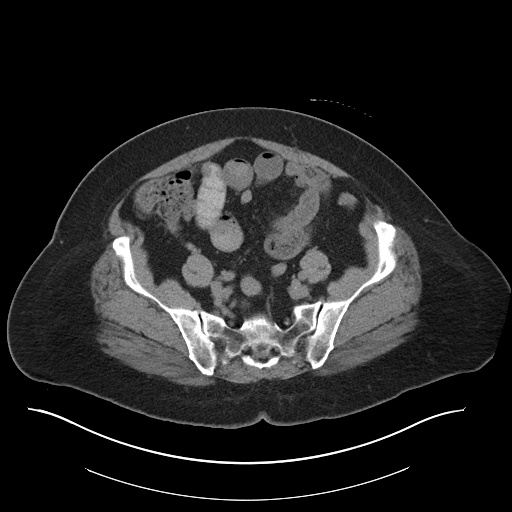
[im 39/94  soft-tissue]
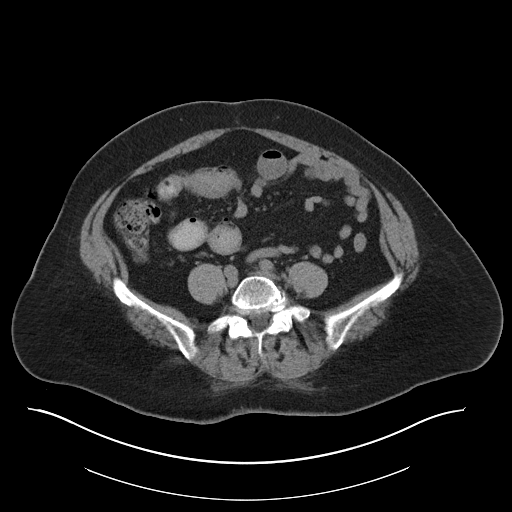
[im 47/94  soft-tissue]
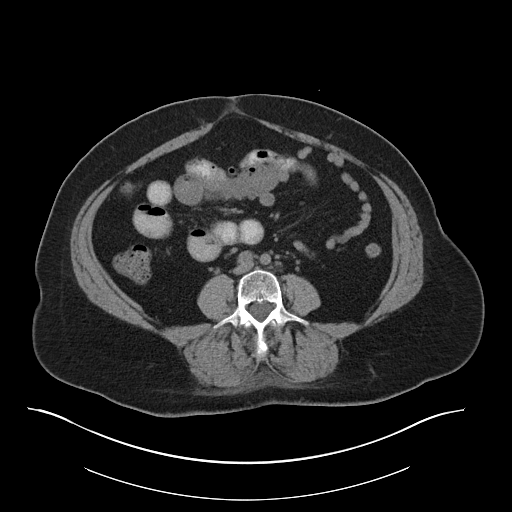
[im 55/94  soft-tissue]
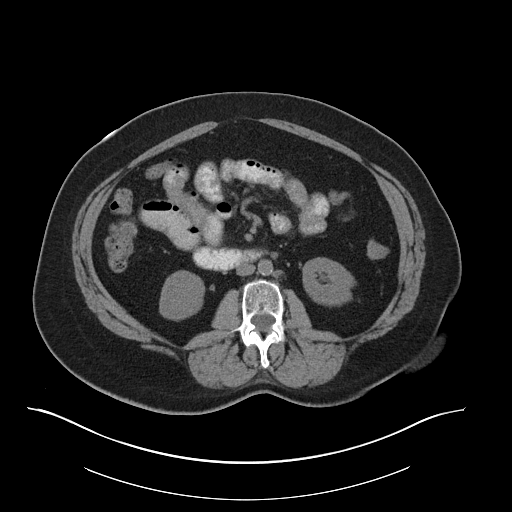
[im 63/94  soft-tissue]
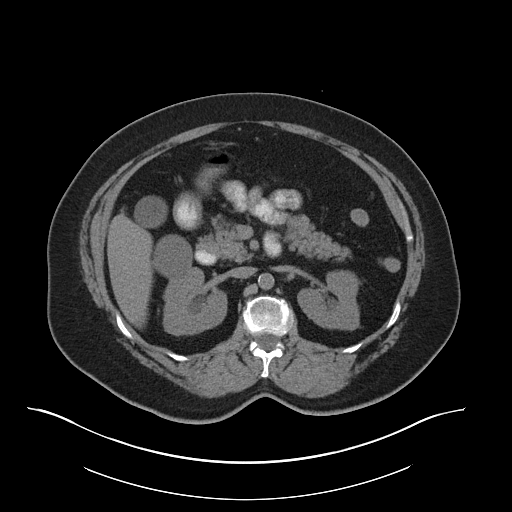
[im 63/94  bone]
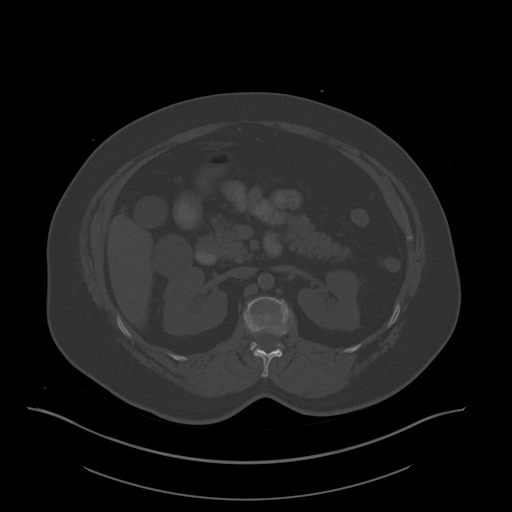
[im 66/94  soft-tissue]
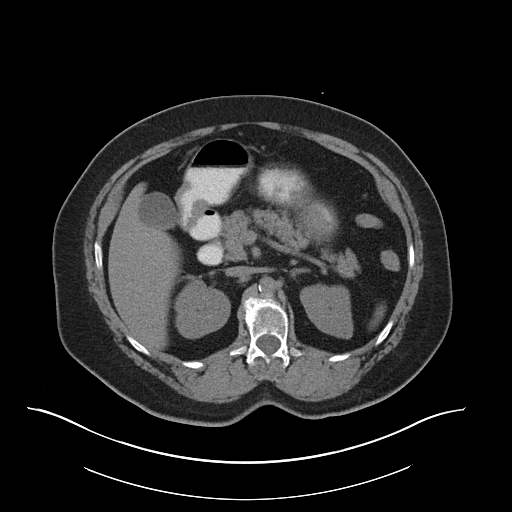
[im 74/94  soft-tissue]
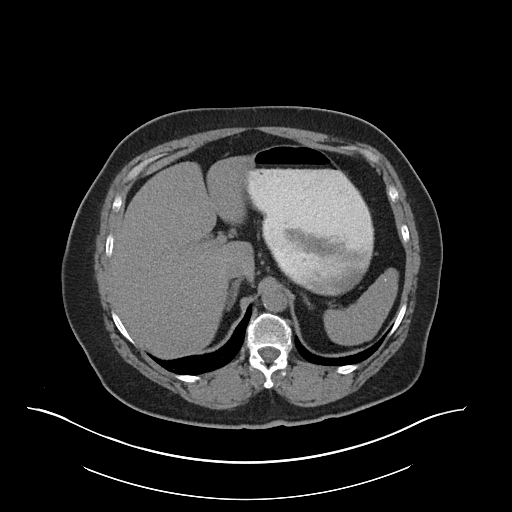
[im 82/94  soft-tissue]
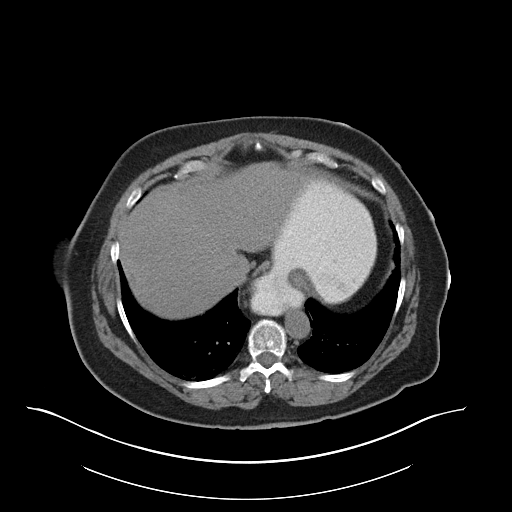
[im 90/94  soft-tissue]
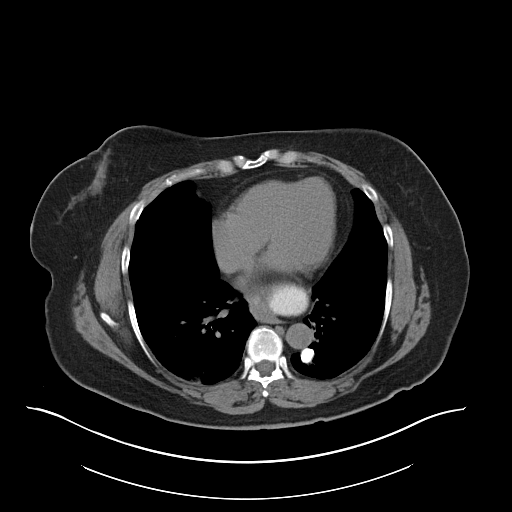

[Series 5: cor st · coronal · 0.92mm/px · 3 of 95 slices shown]
[im 32/95  soft-tissue]
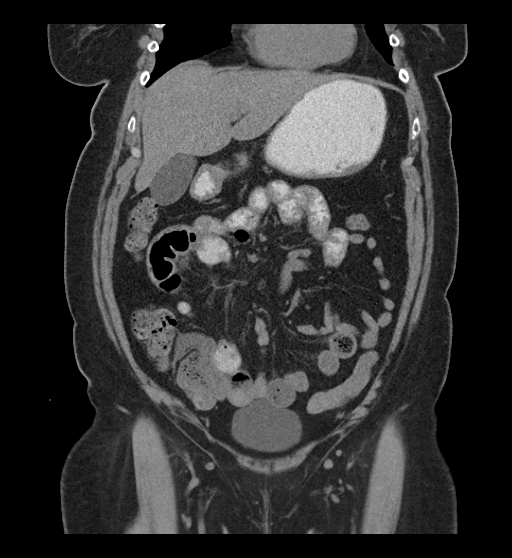
[im 42/95  soft-tissue]
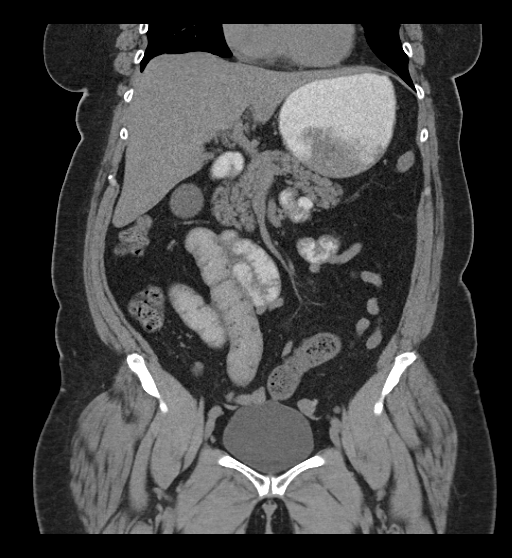
[im 53/95  soft-tissue]
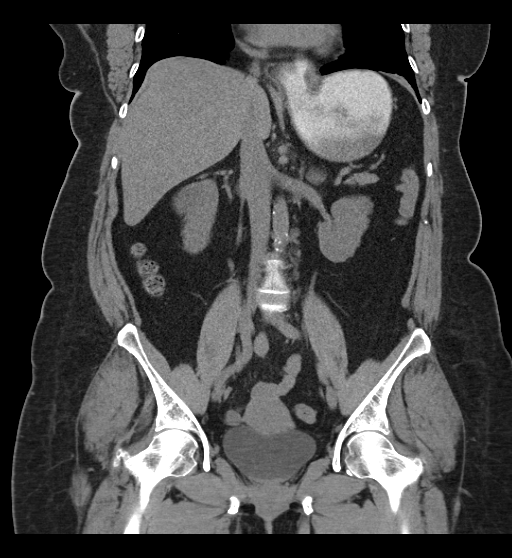

[16 of 46 positions shown; findings below may reference images not displayed]

FINDINGS: Bibasilar linear and patchy opacities most consistent with
atelectasis. Large granuloma present within the posterior left lower
lobe. No pleural or pericardial effusion.

Limited noncontrast evaluation of the liver is unremarkable.
Gallbladder within normal limits. No biliary dilatation. The spleen,
adrenal glands, and pancreas demonstrate a normal unenhanced
appearance.

Nonobstructive 3 mm stone present within knee mid -upper right
kidney. No evidence for hydronephrosis. No stones seen along the
course of the renal collecting systems bilaterally. 4.1 x 4.2 cm
exophytic cyst extends from the anterior aspect of the right kidney.

Large hiatal hernia present. There are several mildly prominent
fluid-filled loops of small bowel clustered within the mid abdomen
measuring up to 2.6 cm in diameter. Few scattered air-fluid levels
present. There is question of a focal transition point within the
lower left abdomen (series 2, image 62). Wall thickening with hazy
fat stranding and mesenteric edema seen within the small bowel just
proximal to this point. Additionally, there is fecalization of the
small bile at this level, suggesting slow transit. Findings
suspicious for partial or early small bowel obstruction. Distally,
the small bowel is decompressed and normal in appearance. Scattered
colonic diverticular present without acute diverticulitis. Appendix
is normal.

Bladder is unremarkable. Uterus within normal limits. Ovaries
normal.

No free air or fluid. Small fat containing paraumbilical hernia
noted. No pathologically enlarged intra-abdominal pelvic lymph
nodes. Mild scatter multi focal atherosclerotic calcifications
present within the intra-abdominal aorta without aneurysm.

No acute osseous abnormality. No worrisome lytic or blastic osseous
lesions. Degenerative disc desiccation present at L5-S1 and T11-12.
IMPRESSION: 1. Findings concerning for partial versus early small bowel
obstruction with transition point in the lower left abdomen. No
evidence for perforation.
2. Colonic diverticulosis without acute diverticulitis.
3. 3 mm nonobstructive right renal calculus.
4. Large hiatal hernia.

## 2014-09-27 MED ORDER — PROMETHAZINE HCL 25 MG/ML IJ SOLN
12.5000 mg | Freq: Four times a day (QID) | INTRAMUSCULAR | Status: DC | PRN
  Administered 2014-09-27: 12.5 mg via INTRAVENOUS
  Filled 2014-09-27: qty 1

## 2014-09-27 MED ORDER — BENZONATATE 100 MG PO CAPS
200.0000 mg | ORAL_CAPSULE | Freq: Three times a day (TID) | ORAL | Status: DC
  Administered 2014-09-27 – 2014-09-29 (×7): 200 mg via ORAL
  Filled 2014-09-27 (×10): qty 2

## 2014-09-27 MED ORDER — ONDANSETRON HCL 4 MG/2ML IJ SOLN
4.0000 mg | Freq: Once | INTRAMUSCULAR | Status: AC
  Administered 2014-09-27: 4 mg via INTRAVENOUS

## 2014-09-27 MED ORDER — PANTOPRAZOLE SODIUM 40 MG IV SOLR
40.0000 mg | INTRAVENOUS | Status: DC
  Administered 2014-09-27: 40 mg via INTRAVENOUS
  Filled 2014-09-27: qty 40

## 2014-09-27 MED ORDER — HYDROCOD POLST-CHLORPHEN POLST 10-8 MG/5ML PO LQCR
5.0000 mL | Freq: Two times a day (BID) | ORAL | Status: DC | PRN
  Administered 2014-09-28: 5 mL via ORAL
  Filled 2014-09-27 (×2): qty 5

## 2014-09-27 MED ORDER — IOHEXOL 300 MG/ML  SOLN
25.0000 mL | Freq: Once | INTRAMUSCULAR | Status: AC | PRN
  Administered 2014-09-27: 25 mL via ORAL

## 2014-09-27 MED ORDER — ACETAMINOPHEN 325 MG PO TABS: 650.0000 mg | ORAL_TABLET | Freq: Four times a day (QID) | ORAL | Status: DC | PRN

## 2014-09-27 MED ORDER — ONDANSETRON HCL 4 MG/2ML IJ SOLN
INTRAMUSCULAR | Status: AC
  Filled 2014-09-27: qty 2

## 2014-09-27 MED ORDER — ACETAMINOPHEN 650 MG RE SUPP: 650.0000 mg | Freq: Four times a day (QID) | RECTAL | Status: DC | PRN

## 2014-09-27 MED ORDER — AZITHROMYCIN 250 MG PO TABS
250.0000 mg | ORAL_TABLET | Freq: Every day | ORAL | Status: DC
  Administered 2014-09-28 – 2014-09-29 (×2): 250 mg via ORAL
  Filled 2014-09-27 (×2): qty 1

## 2014-09-27 MED ORDER — ALUM & MAG HYDROXIDE-SIMETH 200-200-20 MG/5ML PO SUSP: 30.0000 mL | Freq: Four times a day (QID) | ORAL | Status: DC | PRN

## 2014-09-27 MED ORDER — OXYCODONE HCL 5 MG PO TABS: 5.0000 mg | ORAL_TABLET | ORAL | Status: DC | PRN

## 2014-09-27 MED ORDER — ENOXAPARIN SODIUM 30 MG/0.3ML ~~LOC~~ SOLN
30.0000 mg | SUBCUTANEOUS | Status: DC
  Filled 2014-09-27: qty 0.3

## 2014-09-27 MED ORDER — ENOXAPARIN SODIUM 40 MG/0.4ML ~~LOC~~ SOLN
40.0000 mg | SUBCUTANEOUS | Status: DC
  Administered 2014-09-27 – 2014-09-28 (×2): 40 mg via SUBCUTANEOUS
  Filled 2014-09-27 (×3): qty 0.4

## 2014-09-27 MED ORDER — HYDROMORPHONE HCL 1 MG/ML IJ SOLN
0.5000 mg | INTRAMUSCULAR | Status: DC | PRN
Start: 2014-09-27 — End: 2014-09-29
  Administered 2014-09-27: 0.5 mg via INTRAVENOUS
  Filled 2014-09-27: qty 1

## 2014-09-27 MED ORDER — SODIUM CHLORIDE 0.9 % IV SOLN
INTRAVENOUS | Status: DC
  Administered 2014-09-27: 05:00:00 via INTRAVENOUS
  Administered 2014-09-27: 1000 mL via INTRAVENOUS
  Administered 2014-09-28: 04:00:00 via INTRAVENOUS

## 2014-09-27 MED ORDER — MORPHINE SULFATE 4 MG/ML IJ SOLN
4.0000 mg | Freq: Once | INTRAMUSCULAR | Status: AC
  Administered 2014-09-27: 4 mg via INTRAVENOUS
  Filled 2014-09-27: qty 1

## 2014-09-27 MED ORDER — IOHEXOL 300 MG/ML  SOLN: 100.0000 mL | Freq: Once | INTRAMUSCULAR | Status: DC | PRN

## 2014-09-27 MED ORDER — ONDANSETRON HCL 4 MG/2ML IJ SOLN
4.0000 mg | Freq: Four times a day (QID) | INTRAMUSCULAR | Status: DC | PRN
Start: 2014-09-27 — End: 2014-09-29
  Administered 2014-09-27: 4 mg via INTRAVENOUS
  Filled 2014-09-27 (×2): qty 2

## 2014-09-27 MED ORDER — ONDANSETRON HCL 4 MG PO TABS
4.0000 mg | ORAL_TABLET | Freq: Four times a day (QID) | ORAL | Status: DC | PRN
Start: 2014-09-27 — End: 2014-09-29

## 2014-09-27 MED ORDER — AZITHROMYCIN 500 MG PO TABS
500.0000 mg | ORAL_TABLET | Freq: Every day | ORAL | Status: AC
  Administered 2014-09-27: 500 mg via ORAL
  Filled 2014-09-27: qty 1

## 2014-09-27 MED ORDER — PANTOPRAZOLE SODIUM 40 MG IV SOLR
40.0000 mg | Freq: Two times a day (BID) | INTRAVENOUS | Status: DC
  Administered 2014-09-27: 40 mg via INTRAVENOUS
  Filled 2014-09-27 (×3): qty 40

## 2014-09-27 NOTE — ED Notes (Signed)
Transporting patient to new room assignment. 

## 2014-09-27 NOTE — Progress Notes (Signed)
PROGRESS NOTE    Katie Weber LZJ:[id number] DOB: 01/09/1946 DOA: 09/26/2014 PCP: Florina Ou, MD  HPI/Brief narrative 68 y.o. female with a history of Hypothyroid, GERD who presents to the ED with complaints of nausea, vomiting, abdominal pain and distention. Chest and abdominal x-ray were negative. CT abdomen suggested SBO. Patient recently completed a course of Z-Pak and steroids for acute bronchitis.    Assessment/Plan:  1. SBO: Treating supportively with nothing by mouth, IV fluids and serial imaging. Surgery consultation appreciated and are not convinced of significant SBO. Follow-up KUB in a.m. to evaluate movement of contrast. 2. Acute bronchitis/cough: With posttussive emesis. Patient has been having significant cough with intermittent creamy sputum and small emesis. We'll repeat chest x-ray to rule out pneumonia. Started Z-Pak and Tessalon pearls. Will add when necessary Tussionex. No clinical bronchospasm-hold steroids for now 3. GERD/hiatal hernia: PPI 4. History of hypothyroid 5. Leukocytosis: Likely stress response. Follow CBCs   Code Status: Full Family Communication: Discussed with spouse on 12/20 Disposition Plan: Home when medically stable   Consultants:  General surgery  Procedures:  None  Antibiotics:  Z-Pak 12/20 >   Subjective: States that abdominal distention and pain have improved. No flatus or BM. Recurrent/persistent cough with intermittent creamy sputum and some clear emesis after coughing spells. No fever or chest pain.  Objective: Filed Vitals:   09/27/14 0431 09/27/14 0501 09/27/14 0614 09/27/14 1428  BP: 145/62  111/68 177/72  Pulse: 74     Temp:   98.4 F (36.9 C) 99 F (37.2 C)  TempSrc:   Oral Oral  Resp: 12  20 15   Height:  5\' 3"  (1.6 m)    Weight:  97.115 kg (214 lb 1.6 oz)    SpO2: 95%  93% 93%    Intake/Output Summary (Last 24 hours) at 09/27/14 1447 Last data filed at 09/27/14 1438  Gross per 24 hour  Intake  502.5  ml  Output      0 ml  Net  502.5 ml   Filed Weights   09/27/14 0501  Weight: 97.115 kg (214 lb 1.6 oz)     Exam:  General exam: Moderately built and obese female lying comfortably in bed. Respiratory system: Slightly harsh breath sounds bilaterally but no wheezing, rhonchi or crackles. No increased work of breathing. Cardiovascular system: S1 & S2 heard, RRR. No JVD, murmurs, gallops, clicks or pedal edema. Gastrointestinal system: Abdomen is non distended/obese, soft and nontender. Normal bowel sounds heard. Central nervous system: Alert and oriented. No focal neurological deficits. Extremities: Symmetric 5 x 5 power.   Data Reviewed: Basic Metabolic Panel:  Recent Labs Lab 09/26/14 2233 09/27/14 0511  NA 137 138  K 4.5 4.4  CL 98 102  CO2 27 23  GLUCOSE 150* 113*  BUN 12 10  CREATININE 0.88 0.77  CALCIUM 9.8 9.4   Liver Function Tests:  Recent Labs Lab 09/26/14 2233  AST 22  ALT 28  ALKPHOS 91  BILITOT 0.5  PROT 7.4  ALBUMIN 3.7    Recent Labs Lab 09/26/14 2233  LIPASE 40   No results for input(s): AMMONIA in the last 168 hours. CBC:  Recent Labs Lab 09/26/14 2233 09/27/14 0511  WBC 17.6* 15.8*  NEUTROABS 15.1*  --   HGB 14.6 13.9  HCT 44.5 42.9  MCV 92.3 92.5  PLT 265 293   Cardiac Enzymes: No results for input(s): CKTOTAL, CKMB, CKMBINDEX, TROPONINI in the last 168 hours. BNP (last 3 results) No results for input(s): PROBNP  in the last 8760 hours. CBG: No results for input(s): GLUCAP in the last 168 hours.  No results found for this or any previous visit (from the past 240 hour(s)).        Studies: Ct Abdomen Pelvis Wo Contrast  09/27/2014   CLINICAL DATA:  Initial evaluation for acute mid upper abdominal pain. History of kidney stones, diverticulitis, gastroesophageal reflux.  EXAM: CT ABDOMEN AND PELVIS WITHOUT CONTRAST  TECHNIQUE: Multidetector CT imaging of the abdomen and pelvis was performed following the standard protocol  without IV contrast.  COMPARISON:  Prior study performed earlier the same day.  FINDINGS: Bibasilar linear and patchy opacities most consistent with atelectasis. Large granuloma present within the posterior left lower lobe. No pleural or pericardial effusion.  Limited noncontrast evaluation of the liver is unremarkable. Gallbladder within normal limits. No biliary dilatation. The spleen, adrenal glands, and pancreas demonstrate a normal unenhanced appearance.  Nonobstructive 3 mm stone present within knee mid -upper right kidney. No evidence for hydronephrosis. No stones seen along the course of the renal collecting systems bilaterally. 4.1 x 4.2 cm exophytic cyst extends from the anterior aspect of the right kidney.  Large hiatal hernia present. There are several mildly prominent fluid-filled loops of small bowel clustered within the mid abdomen measuring up to 2.6 cm in diameter. Few scattered air-fluid levels present. There is question of a focal transition point within the lower left abdomen (series 2, image 62). Wall thickening with hazy fat stranding and mesenteric edema seen within the small bowel just proximal to this point. Additionally, there is fecalization of the small bile at this level, suggesting slow transit. Findings suspicious for partial or early small bowel obstruction. Distally, the small bowel is decompressed and normal in appearance. Scattered colonic diverticular present without acute diverticulitis. Appendix is normal.  Bladder is unremarkable. Uterus within normal limits. Ovaries normal.  No free air or fluid. Small fat containing paraumbilical hernia noted. No pathologically enlarged intra-abdominal pelvic lymph nodes. Mild scatter multi focal atherosclerotic calcifications present within the intra-abdominal aorta without aneurysm.  No acute osseous abnormality. No worrisome lytic or blastic osseous lesions. Degenerative disc desiccation present at L5-S1 and T11-12.  IMPRESSION: 1.  Findings concerning for partial versus early small bowel obstruction with transition point in the lower left abdomen. No evidence for perforation. 2. Colonic diverticulosis without acute diverticulitis. 3. 3 mm nonobstructive right renal calculus. 4. Large hiatal hernia.   Electronically Signed   By: Jeannine Boga M.D.   On: 09/27/2014 02:49   Dg Abd Acute W/chest  09/27/2014   CLINICAL DATA:  Epigastric pain.  EXAM: ACUTE ABDOMEN SERIES (ABDOMEN 2 VIEW & CHEST 1 VIEW)  COMPARISON:  Chest radiograph 05/04/2014.  FINDINGS: There is no evidence of dilated bowel loops or free intraperitoneal air. No radiopaque calculi or other significant radiographic abnormality is seen. Heart size and mediastinal contours are within normal limits. Both lungs are clear.  Moderate-sized hiatal hernia with air-fluid level is redemonstrated. Tortuous aorta.  IMPRESSION: Negative abdominal radiographs.  No acute cardiopulmonary disease.  Moderate-sized hiatal hernia with air-fluid level. Correlate clinically for relation of this finding to epigastric pain   Electronically Signed   By: Rolla Flatten M.D.   On: 09/27/2014 00:27        Scheduled Meds: . [START ON 09/28/2014] azithromycin  250 mg Oral Daily  . benzonatate  200 mg Oral TID  . enoxaparin (LOVENOX) injection  30 mg Subcutaneous Q24H  . pantoprazole (PROTONIX) IV  40 mg Intravenous  Q24H   Continuous Infusions: . sodium chloride 75 mL/hr at 09/27/14 0518    Principal Problem:   SBO (small bowel obstruction) Active Problems:   Nausea and vomiting   Abdominal pain, epigastric   Hypothyroidism   Leukocytosis    Time spent: 17 minutes    Aulton Routt, MD, FACP, FHM. [facility] Pager [phone]  If 7PM-7AM, please contact night-coverage [email or web address] Password [email or web address] 09/27/2014, 2:47 PM    LOS: 1 day

## 2014-09-27 NOTE — H&P (Signed)
[facility] Admission History and Physical       Katie Weber MWN:[id number] DOB: 30-Jan-1946 DOA: 09/26/2014  Referring physician: EDP PCP: Florina Ou, MD  Specialists:   Chief Complaint: ABD Pain, Nausea and Vomiting  HPI: Katie Weber is a 68 y.o. female with a history of Hypothyroid, GERD who presents to the ED with complaints of Nausea and vomiting and ABD Distension and epigastric Pain that started at 3:30 pm.   She denies any hematemesis, or fevers or chills.   A Ct scan of the ABD was performed and revealed an early Small Bowel Obstruction.    She denies any previous similar episodes.      Review of Systems:  Constitutional: No Weight Loss, No Weight Gain, Night Sweats, Fevers, Chills, Dizziness, Fatigue, or Generalized Weakness HEENT: No Headaches, Difficulty Swallowing,Tooth/Dental Problems,Sore Throat,  No Sneezing, Rhinitis, Ear Ache, Nasal Congestion, or Post Nasal Drip,  Cardio-vascular:  No Chest pain, Orthopnea, PND, Edema in Lower Extremities, Anasarca, Dizziness, Palpitations  Resp: No Dyspnea, No DOE, No Productive Cough, No Non-Productive Cough, No Hemoptysis, No Wheezing.    GI: No Heartburn, Indigestion, +Abdominal Pain,+ Nausea, +Vomiting, Diarrhea, Hematemesis, Hematochezia, Melena, Change in Bowel Habits,  Loss of Appetite  GU: No Dysuria, Change in Color of Urine, No Urgency or Frequency, No Flank pain.  Musculoskeletal: No Joint Pain or Swelling, No Decreased Range of Motion, No Back Pain.  Neurologic: No Syncope, No Seizures, Muscle Weakness, Paresthesia, Vision Disturbance or Loss, No Diplopia, No Vertigo, No Difficulty Walking,  Skin: No Rash or Lesions. Psych: No Change in Mood or Affect, No Depression or Anxiety, No Memory loss, No Confusion, or Hallucinations   Past Medical History  Diagnosis Date  . Hypothyroidism   . Pneumonia 2014  . GERD (gastroesophageal reflux disease)     no meds  . Diverticulitis     no problems now  .  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  . Family history of anesthesia complication 40HKV ago    allergic reaction to atropine  . History of kidney stones     '80's lithotripsy-[facility] x1  . Arthritis     arthritis fingers      Past Surgical History  Procedure Laterality Date  . Lithotripsy    . Tubal ligation    . Hysteroscopy w/d&c N/A 04/20/2014    Procedure: DILATATION AND CURETTAGE /HYSTEROSCOPY;  Surgeon: [name], MD;  Location: [facility];  Service: Gynecology;  Laterality: N/A;  . Dilation and curettage of uterus      04-20-14 [facility] endometrial polyp removed.  . Breast surgery Right     '72 -benign tumor  . Childbirth- nvd x2     . Colonoscopy with propofol N/A 05/04/2014    Procedure: COLONOSCOPY WITH PROPOFOL;  Surgeon: Garlan Fair, MD;  Location: [facility];  Service: Endoscopy;  Laterality: N/A;  . Esophagogastroduodenoscopy (egd) with propofol N/A 05/04/2014    Procedure: ESOPHAGOGASTRODUODENOSCOPY (EGD) WITH PROPOFOL;  Surgeon: Garlan Fair, MD;  Location: [facility];  Service: Endoscopy;  Laterality: N/A;  . Balloon dilation N/A 05/04/2014    Procedure: BALLOON DILATION;  Surgeon: Garlan Fair, MD;  Location: [facility];  Service: Endoscopy;  Laterality: N/A;       Prior to Admission medications   Medication Sig Start Date End Date Taking? Authorizing Provider  albuterol (PROVENTIL HFA;VENTOLIN HFA) 108 (90 BASE) MCG/ACT inhaler Inhale 2 puffs into the  lungs every 6 (six) hours as needed for wheezing or shortness of breath (seasonal allergies).   Yes Historical Provider, MD  fluticasone (FLONASE) 50 MCG/ACT nasal spray Place 1 spray into both nostrils daily as needed for allergies or rhinitis (seasonal allergies).   Yes Historical Provider, MD  levothyroxine (SYNTHROID, LEVOTHROID) 100 MCG tablet Take 100 mcg by mouth daily before breakfast.   Yes Historical Provider, MD  loratadine  (CLARITIN) 10 MG tablet Take 10 mg by mouth daily as needed for allergies (seasonal allergies).   Yes Historical Provider, MD  omeprazole (PRILOSEC) 20 MG capsule Take 20 mg by mouth 2 (two) times daily before a meal.  09/14/14  Yes Historical Provider, MD  tretinoin (RETIN-A) 0.025 % gel Apply 1 application topically every other day. Takes at bedtime   Yes Historical Provider, MD  ibuprofen (ADVIL,MOTRIN) 800 MG tablet Take 1 tablet (800 mg total) by mouth every 8 (eight) hours as needed for cramping. Patient not taking: Reported on 09/26/2014 04/20/14   [name], MD  oxyCODONE-acetaminophen (ROXICET) 5-325 MG per tablet Take 1-2 tablets by mouth every 4 (four) hours as needed for severe pain. Patient not taking: Reported on 09/26/2014 04/20/14   [name], MD  ranitidine (ZANTAC) 150 MG capsule Take 1 capsule (150 mg total) by mouth 2 (two) times daily. 09/26/14   Everlene Balls, MD      Allergies  Allergen Reactions  . Contrast Media [Iodinated Diagnostic Agents] Shortness Of Breath and Other (See Comments)    Tightness in chest. Trouble breathing  . Penicillins Hives     Social History:  reports that she has never smoked. She does not have any smokeless tobacco history on file. She reports that she drinks alcohol. She reports that she does not use illicit drugs.     History reviewed. No pertinent family history.     Physical Exam:  GEN:  Pleasant Well Nourished and Well developed  68 y.o. Caucasian female examined  and in no acute distress; cooperative with exam Filed Vitals:   09/27/14 0245 09/27/14 0315 09/27/14 0330 09/27/14 0345  BP: 155/68  144/75   Pulse: 71 68  66  Temp:      TempSrc:      Resp:      SpO2: 94% 97%  96%   Blood pressure 144/75, pulse 66, temperature 98.3 F (36.8 C), temperature source Oral, resp. rate 12, SpO2 96 %. PSYCH: She is alert and oriented x4; does not appear anxious does not appear depressed; affect is normal HEENT: Normocephalic and  Atraumatic, Mucous membranes pink; PERRLA; EOM intact; Fundi:  Benign;  No scleral icterus, Nares: Patent, Oropharynx: Clear, Fair Dentition,    Neck:  FROM, No Cervical Lymphadenopathy nor Thyromegaly or Carotid Bruit; No JVD; Breasts:: Not examined CHEST WALL: No tenderness CHEST: Normal respiration, clear to auscultation bilaterally HEART: Regular rate and rhythm; no murmurs rubs or gallops BACK: No kyphosis or scoliosis; No CVA tenderness ABDOMEN: Positive Bowel Sounds, Soft Non-Tender; No Masses, No Organomegaly.   . Rectal Exam: Not done EXTREMITIES: No Cyanosis, Clubbing, or Edema; No Ulcerations. Genitalia: not examined PULSES: 2+ and symmetric SKIN: Normal hydration no rash or ulceration CNS:   Mental Status:  Alert x Oriented x 4, No Focal Deficits Vascular: pulses palpable throughout    Labs on Admission:  Basic Metabolic Panel:  Recent Labs Lab 09/26/14 2233  NA 137  K 4.5  CL 98  CO2 27  GLUCOSE 150*  BUN 12  CREATININE 0.88  CALCIUM 9.8   Liver Function Tests:  Recent Labs Lab 09/26/14 2233  AST 22  ALT 28  ALKPHOS 91  BILITOT 0.5  PROT 7.4  ALBUMIN 3.7    Recent Labs Lab 09/26/14 2233  LIPASE 40   No results for input(s): AMMONIA in the last 168 hours. CBC:  Recent Labs Lab 09/26/14 2233  WBC 17.6*  NEUTROABS 15.1*  HGB 14.6  HCT 44.5  MCV 92.3  PLT 265   Cardiac Enzymes: No results for input(s): CKTOTAL, CKMB, CKMBINDEX, TROPONINI in the last 168 hours.  BNP (last 3 results) No results for input(s): PROBNP in the last 8760 hours. CBG: No results for input(s): GLUCAP in the last 168 hours.  Radiological Exams on Admission: Ct Abdomen Pelvis Wo Contrast  09/27/2014   CLINICAL DATA:  Initial evaluation for acute mid upper abdominal pain. History of kidney stones, diverticulitis, gastroesophageal reflux.  EXAM: CT ABDOMEN AND PELVIS WITHOUT CONTRAST  TECHNIQUE: Multidetector CT imaging of the abdomen and pelvis was performed  following the standard protocol without IV contrast.  COMPARISON:  Prior study performed earlier the same day.  FINDINGS: Bibasilar linear and patchy opacities most consistent with atelectasis. Large granuloma present within the posterior left lower lobe. No pleural or pericardial effusion.  Limited noncontrast evaluation of the liver is unremarkable. Gallbladder within normal limits. No biliary dilatation. The spleen, adrenal glands, and pancreas demonstrate a normal unenhanced appearance.  Nonobstructive 3 mm stone present within knee mid -upper right kidney. No evidence for hydronephrosis. No stones seen along the course of the renal collecting systems bilaterally. 4.1 x 4.2 cm exophytic cyst extends from the anterior aspect of the right kidney.  Large hiatal hernia present. There are several mildly prominent fluid-filled loops of small bowel clustered within the mid abdomen measuring up to 2.6 cm in diameter. Few scattered air-fluid levels present. There is question of a focal transition point within the lower left abdomen (series 2, image 62). Wall thickening with hazy fat stranding and mesenteric edema seen within the small bowel just proximal to this point. Additionally, there is fecalization of the small bile at this level, suggesting slow transit. Findings suspicious for partial or early small bowel obstruction. Distally, the small bowel is decompressed and normal in appearance. Scattered colonic diverticular present without acute diverticulitis. Appendix is normal.  Bladder is unremarkable. Uterus within normal limits. Ovaries normal.  No free air or fluid. Small fat containing paraumbilical hernia noted. No pathologically enlarged intra-abdominal pelvic lymph nodes. Mild scatter multi focal atherosclerotic calcifications present within the intra-abdominal aorta without aneurysm.  No acute osseous abnormality. No worrisome lytic or blastic osseous lesions. Degenerative disc desiccation present at L5-S1 and  T11-12.  IMPRESSION: 1. Findings concerning for partial versus early small bowel obstruction with transition point in the lower left abdomen. No evidence for perforation. 2. Colonic diverticulosis without acute diverticulitis. 3. 3 mm nonobstructive right renal calculus. 4. Large hiatal hernia.   Electronically Signed   By: Jeannine Boga M.D.   On: 09/27/2014 02:49   Dg Abd Acute W/chest  09/27/2014   CLINICAL DATA:  Epigastric pain.  EXAM: ACUTE ABDOMEN SERIES (ABDOMEN 2 VIEW & CHEST 1 VIEW)  COMPARISON:  Chest radiograph 05/04/2014.  FINDINGS: There is no evidence of dilated bowel loops or free intraperitoneal air. No radiopaque calculi or other significant radiographic abnormality is seen. Heart size and mediastinal contours are within normal limits. Both lungs are clear.  Moderate-sized hiatal hernia with air-fluid level is redemonstrated. Tortuous aorta.  IMPRESSION: Negative abdominal radiographs.  No acute cardiopulmonary disease.  Moderate-sized hiatal hernia with air-fluid level. Correlate clinically for relation of this finding to epigastric pain   Electronically Signed   By: Rolla Flatten M.D.   On: 09/27/2014 00:27     EKG: Independently reviewed. Normal Sinus Rhythm rate 69   Assessment/Plan:   68 y.o. female with  Principal Problem:   1.   SBO (small bowel obstruction)   Anti-Emetics PRN   PRN IV Dlaudid for Pain   IV Protonix   IVFs   General Surgery Evaluation and NG-Tube Placement if worsening     Active Problems:   2.   Nausea and vomiting- due to #1     3.   Abdominal pain, epigastric- due to #1     4.   Hypothyroidism   Continue Levothyroxine     5.   Leukocytosis- due to Stress Rxn   Monitor Trend     6.   DVT Prophylaxis    Lovenox    Code Status:   FULL CODE Family Communication:   Husband at Bedside   Disposition Plan:     Inpatient  Time spent: [place] [facility] Pager [phone]   If [place] Please  Contact the Day Rounding Team MD for [facility]  If 7PM-7AM, Please Contact Night-Floor Coverage  [email or web address] Password [email or web address] 09/27/2014, 4:08 AM

## 2014-09-27 NOTE — ED Notes (Signed)
Pt up to the br with assistance

## 2014-09-27 NOTE — Progress Notes (Signed)
Utilization Review Completed.Katie Weber T12/20/2015  

## 2014-09-27 NOTE — Consult Note (Signed)
Katie Weber 1945/12/16  [id number].   Primary Care MD: Dr. Florina Ou Requesting MD: Dr. Vernell Leep Chief Complaint/Reason for Consult: PSBO HPI: This is a 68 yo white female who has hypothyroidism who has had this upper respiratory problem for several weeks.  She developed this horrific cough.  Her PCP office tested her for flu, PNA, etc.  Everything was negative.  She was treated with 3 days of a Z-pack and 12 days of a steroid.  This was completed 2 days ago.  Yesterday, she began developing epigastric abdominal pain and nausea with some emesis.  She had 3 BMs yesterday, but no flatus or BM since then.  She came to the [facility] where she had a CT scan that revealed some only mildly dilated loops of small bowel with a possible transition point.  They could not rule out an early or partial small bowel obstruction.  She was admitted.  Oddly enough, her cough has returned and that is what is causing her the most issues currently.  We have been asked to see her for possible SBO.  ROS : Please see HPI, otherwise all other systems have been reviewed and are normal.  History reviewed. No pertinent family history.  Past Medical History  Diagnosis Date  . Hypothyroidism   . Pneumonia 2014  . GERD (gastroesophageal reflux disease)     no meds  . Diverticulitis     no problems now  . 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  . Family history of anesthesia complication 40JWJ ago    allergic reaction to atropine  . History of kidney stones     '80's lithotripsy-[facility] x1  . Arthritis     arthritis fingers    Past Surgical History  Procedure Laterality Date  . Lithotripsy    . Tubal ligation    . Hysteroscopy w/d&c N/A 04/20/2014    Procedure: DILATATION AND CURETTAGE /HYSTEROSCOPY;  Surgeon: [name], MD;  Location: [facility];  Service: Gynecology;  Laterality: N/A;  . Dilation and curettage of uterus      04-20-14 [facility] hospital  endometrial polyp removed.  . Breast surgery Right     '72 -benign tumor  . Childbirth- nvd x2     . Colonoscopy with propofol N/A 05/04/2014    Procedure: COLONOSCOPY WITH PROPOFOL;  Surgeon: Garlan Fair, MD;  Location: [facility];  Service: Endoscopy;  Laterality: N/A;  . Esophagogastroduodenoscopy (egd) with propofol N/A 05/04/2014    Procedure: ESOPHAGOGASTRODUODENOSCOPY (EGD) WITH PROPOFOL;  Surgeon: Garlan Fair, MD;  Location: [facility];  Service: Endoscopy;  Laterality: N/A;  . Balloon dilation N/A 05/04/2014    Procedure: BALLOON DILATION;  Surgeon: Garlan Fair, MD;  Location: [facility];  Service: Endoscopy;  Laterality: N/A;    Social History:  reports that she has never smoked. She does not have any smokeless tobacco history on file. She reports that she drinks alcohol. She reports that she does not use illicit drugs.  Allergies:  Allergies  Allergen Reactions  . Contrast Media [Iodinated Diagnostic Agents] Shortness Of Breath and Other (See Comments)    Tightness in chest. Trouble breathing  . Penicillins Hives    Medications Prior to Admission  Medication Sig Dispense Refill  . albuterol (PROVENTIL HFA;VENTOLIN HFA) 108 (90 BASE) MCG/ACT inhaler Inhale 2 puffs into the lungs every 6 (six) hours as needed for wheezing or shortness of breath (seasonal allergies).    Marland Kitchen  fluticasone (FLONASE) 50 MCG/ACT nasal spray Place 1 spray into both nostrils daily as needed for allergies or rhinitis (seasonal allergies).    Marland Kitchen levothyroxine (SYNTHROID, LEVOTHROID) 100 MCG tablet Take 100 mcg by mouth daily before breakfast.    . loratadine (CLARITIN) 10 MG tablet Take 10 mg by mouth daily as needed for allergies (seasonal allergies).    Marland Kitchen omeprazole (PRILOSEC) 20 MG capsule Take 20 mg by mouth 2 (two) times daily before a meal.   0  . tretinoin (RETIN-A) 0.025 % gel Apply 1 application topically every other day. Takes at bedtime    . ibuprofen (ADVIL,MOTRIN) 800 MG tablet  Take 1 tablet (800 mg total) by mouth every 8 (eight) hours as needed for cramping. (Patient not taking: Reported on 09/26/2014) 60 tablet 3  . oxyCODONE-acetaminophen (ROXICET) 5-325 MG per tablet Take 1-2 tablets by mouth every 4 (four) hours as needed for severe pain. (Patient not taking: Reported on 09/26/2014) 30 tablet 0    Blood pressure 111/68, pulse 74, temperature 98.4 F (36.9 C), temperature source Oral, resp. rate 20, height '5\' 3"'  (1.6 m), weight 214 lb 1.6 oz (97.115 kg), SpO2 93 %. Physical Exam: General: pleasant, obese white female who is laying in bed in NAD, except when she starts to have this horrific cough HEENT: head is normocephalic, atraumatic.  Sclera are noninjected.  PERRL.  Ears and nose without any masses or lesions.  Mouth is pink and moist Heart: regular, rate, and rhythm.  Normal s1,s2. No obvious murmurs, gallops, or rubs noted.  Palpable radial and pedal pulses bilaterally Lungs: bilateral expiratory wheezing and few rhonchi noted.  Respiratory effort nonlabored Abd: soft, NT, ND, +BS, no masses, hernias, or organomegaly, obese MS: all 4 extremities are symmetrical with no cyanosis, clubbing, or edema. Skin: warm and dry with no masses, lesions, or rashes Psych: A&Ox3 with an appropriate affect.    Results for orders placed or performed during the hospital encounter of 09/26/14 (from the past 48 hour(s))  CBC with Differential     Status: Abnormal   Collection Time: 09/26/14 10:33 PM  Result Value Ref Range   WBC 17.6 (H) 4.0 - 10.5 K/uL   RBC 4.82 3.87 - 5.11 MIL/uL   Hemoglobin 14.6 12.0 - 15.0 g/dL   HCT 44.5 36.0 - 46.0 %   MCV 92.3 78.0 - 100.0 fL   MCH 30.3 26.0 - 34.0 pg   MCHC 32.8 30.0 - 36.0 g/dL   RDW 13.8 11.5 - 15.5 %   Platelets 265 150 - 400 K/uL   Neutrophils Relative % 85 (H) 43 - 77 %   Neutro Abs 15.1 (H) 1.7 - 7.7 K/uL   Lymphocytes Relative 10 (L) 12 - 46 %   Lymphs Abs 1.7 0.7 - 4.0 K/uL   Monocytes Relative 4 3 - 12 %    Monocytes Absolute 0.7 0.1 - 1.0 K/uL   Eosinophils Relative 1 0 - 5 %   Eosinophils Absolute 0.1 0.0 - 0.7 K/uL   Basophils Relative 0 0 - 1 %   Basophils Absolute 0.0 0.0 - 0.1 K/uL  Comprehensive metabolic panel     Status: Abnormal   Collection Time: 09/26/14 10:33 PM  Result Value Ref Range   Sodium 137 137 - 147 mEq/L   Potassium 4.5 3.7 - 5.3 mEq/L   Chloride 98 96 - 112 mEq/L   CO2 27 19 - 32 mEq/L   Glucose, Bld 150 (H) 70 - 99 mg/dL   BUN  12 6 - 23 mg/dL   Creatinine, Ser 0.88 0.50 - 1.10 mg/dL   Calcium 9.8 8.4 - 10.5 mg/dL   Total Protein 7.4 6.0 - 8.3 g/dL   Albumin 3.7 3.5 - 5.2 g/dL   AST 22 0 - 37 U/L   ALT 28 0 - 35 U/L   Alkaline Phosphatase 91 39 - 117 U/L   Total Bilirubin 0.5 0.3 - 1.2 mg/dL   GFR calc non Af Amer 66 (L) >90 mL/min   GFR calc Af Amer 76 (L) >90 mL/min    Comment: (NOTE) The eGFR has been calculated using the CKD EPI equation. This calculation has not been validated in all clinical situations. eGFR's persistently <90 mL/min signify possible Chronic Kidney Disease.    Anion gap 12 5 - 15  Lipase, blood     Status: None   Collection Time: 09/26/14 10:33 PM  Result Value Ref Range   Lipase 40 11 - 59 U/L  Basic metabolic panel     Status: Abnormal   Collection Time: 09/27/14  5:11 AM  Result Value Ref Range   Sodium 138 137 - 147 mEq/L   Potassium 4.4 3.7 - 5.3 mEq/L   Chloride 102 96 - 112 mEq/L   CO2 23 19 - 32 mEq/L   Glucose, Bld 113 (H) 70 - 99 mg/dL   BUN 10 6 - 23 mg/dL   Creatinine, Ser 0.77 0.50 - 1.10 mg/dL   Calcium 9.4 8.4 - 10.5 mg/dL   GFR calc non Af Amer 84 (L) >90 mL/min   GFR calc Af Amer >90 >90 mL/min    Comment: (NOTE) The eGFR has been calculated using the CKD EPI equation. This calculation has not been validated in all clinical situations. eGFR's persistently <90 mL/min signify possible Chronic Kidney Disease.    Anion gap 13 5 - 15  CBC     Status: Abnormal   Collection Time: 09/27/14  5:11 AM  Result  Value Ref Range   WBC 15.8 (H) 4.0 - 10.5 K/uL   RBC 4.64 3.87 - 5.11 MIL/uL   Hemoglobin 13.9 12.0 - 15.0 g/dL   HCT 42.9 36.0 - 46.0 %   MCV 92.5 78.0 - 100.0 fL   MCH 30.0 26.0 - 34.0 pg   MCHC 32.4 30.0 - 36.0 g/dL   RDW 14.1 11.5 - 15.5 %   Platelets 293 150 - 400 K/uL   Ct Abdomen Pelvis Wo Contrast  09/27/2014   CLINICAL DATA:  Initial evaluation for acute mid upper abdominal pain. History of kidney stones, diverticulitis, gastroesophageal reflux.  EXAM: CT ABDOMEN AND PELVIS WITHOUT CONTRAST  TECHNIQUE: Multidetector CT imaging of the abdomen and pelvis was performed following the standard protocol without IV contrast.  COMPARISON:  Prior study performed earlier the same day.  FINDINGS: Bibasilar linear and patchy opacities most consistent with atelectasis. Large granuloma present within the posterior left lower lobe. No pleural or pericardial effusion.  Limited noncontrast evaluation of the liver is unremarkable. Gallbladder within normal limits. No biliary dilatation. The spleen, adrenal glands, and pancreas demonstrate a normal unenhanced appearance.  Nonobstructive 3 mm stone present within knee mid -upper right kidney. No evidence for hydronephrosis. No stones seen along the course of the renal collecting systems bilaterally. 4.1 x 4.2 cm exophytic cyst extends from the anterior aspect of the right kidney.  Large hiatal hernia present. There are several mildly prominent fluid-filled loops of small bowel clustered within the mid abdomen measuring up to 2.6 cm  in diameter. Few scattered air-fluid levels present. There is question of a focal transition point within the lower left abdomen (series 2, image 62). Wall thickening with hazy fat stranding and mesenteric edema seen within the small bowel just proximal to this point. Additionally, there is fecalization of the small bile at this level, suggesting slow transit. Findings suspicious for partial or early small bowel obstruction. Distally,  the small bowel is decompressed and normal in appearance. Scattered colonic diverticular present without acute diverticulitis. Appendix is normal.  Bladder is unremarkable. Uterus within normal limits. Ovaries normal.  No free air or fluid. Small fat containing paraumbilical hernia noted. No pathologically enlarged intra-abdominal pelvic lymph nodes. Mild scatter multi focal atherosclerotic calcifications present within the intra-abdominal aorta without aneurysm.  No acute osseous abnormality. No worrisome lytic or blastic osseous lesions. Degenerative disc desiccation present at L5-S1 and T11-12.  IMPRESSION: 1. Findings concerning for partial versus early small bowel obstruction with transition point in the lower left abdomen. No evidence for perforation. 2. Colonic diverticulosis without acute diverticulitis. 3. 3 mm nonobstructive right renal calculus. 4. Large hiatal hernia.   Electronically Signed   By: Jeannine Boga M.D.   On: 09/27/2014 02:49   Dg Abd Acute W/chest  09/27/2014   CLINICAL DATA:  Epigastric pain.  EXAM: ACUTE ABDOMEN SERIES (ABDOMEN 2 VIEW & CHEST 1 VIEW)  COMPARISON:  Chest radiograph 05/04/2014.  FINDINGS: There is no evidence of dilated bowel loops or free intraperitoneal air. No radiopaque calculi or other significant radiographic abnormality is seen. Heart size and mediastinal contours are within normal limits. Both lungs are clear.  Moderate-sized hiatal hernia with air-fluid level is redemonstrated. Tortuous aorta.  IMPRESSION: Negative abdominal radiographs.  No acute cardiopulmonary disease.  Moderate-sized hiatal hernia with air-fluid level. Correlate clinically for relation of this finding to epigastric pain   Electronically Signed   By: Rolla Flatten M.D.   On: 09/27/2014 00:27       Assessment/Plan 1. Abdominal pain, resolved 2. Nausea, vomiting, resolved 3. pSBO vs ileus  Plan: 1. The patient's CT scan does not show a significant bowel obstruction.  We will  repeat abdominal films tomorrow morning to evaluate where her contrast is.  Agree with NPO for now, even though she has no further nausea and vomiting and give her at least 24 hours of bowel rest.  I do not see any surgical indications at this time.  We will follow along 2. Of more concern to her is her cough.  This sounds fairly bad.  Her CXR from overnight says her lungs are clear, but she has abnormal lung sounds on exam.  Will defer this to medicine.  Katie Weber 09/27/2014, 11:58 AM Pager: [phone]

## 2014-09-27 NOTE — ED Notes (Signed)
Attempted report 

## 2014-09-27 NOTE — Progress Notes (Signed)
Pt arrived to unit alert and oriented x4. Oriented to room, unit, and staff.  Bed in lowest position and call bell is within reach. Will continue to monitor. 

## 2014-09-27 NOTE — Progress Notes (Signed)
Received report from [place], [place] in ED.

## 2014-09-27 NOTE — ED Notes (Signed)
c-t notified that the pt  Has finished her first cup of contrast

## 2014-09-27 NOTE — ED Notes (Signed)
Patient transported to CT 

## 2014-09-27 NOTE — ED Notes (Signed)
Admitting MD at bedside.

## 2014-09-27 NOTE — Progress Notes (Signed)
Attempted to call report. Will call back.  

## 2014-09-27 NOTE — ED Notes (Signed)
EDP at bedside  

## 2014-09-28 ENCOUNTER — Inpatient Hospital Stay (HOSPITAL_COMMUNITY): Payer: Medicare Other

## 2014-09-28 DIAGNOSIS — J209 Acute bronchitis, unspecified: Secondary | ICD-10-CM

## 2014-09-28 IMAGING — CR DG ABDOMEN 2V
2 series · 2 of 2 positions shown · non-contrast
Comparison: CT [DATE].  Abdomen [DATE].

CLINICAL DATA: Partial small bowel obstruction.

EXAM:
ABDOMEN - 2 VIEW

[abdomen erect]
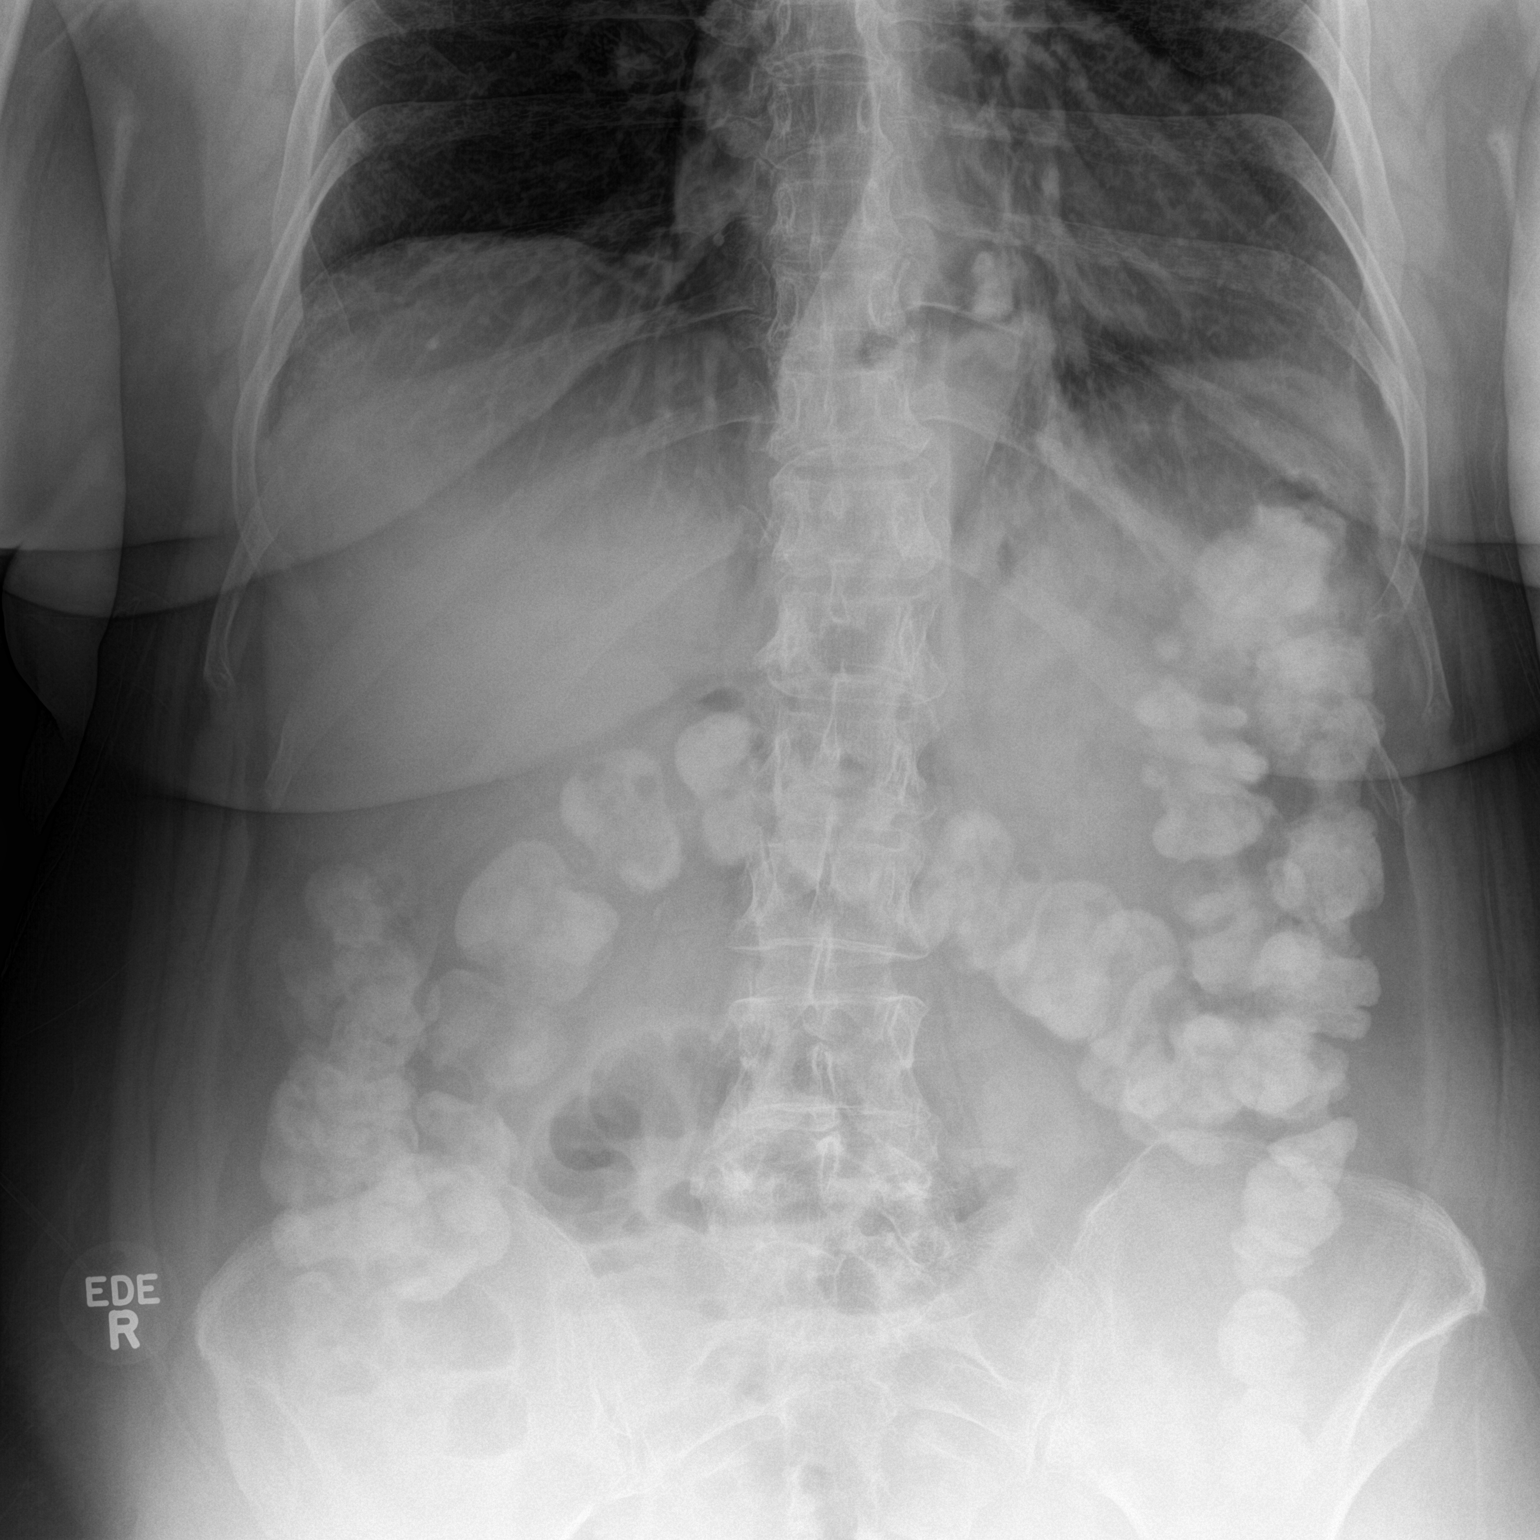

[abdomen supine]
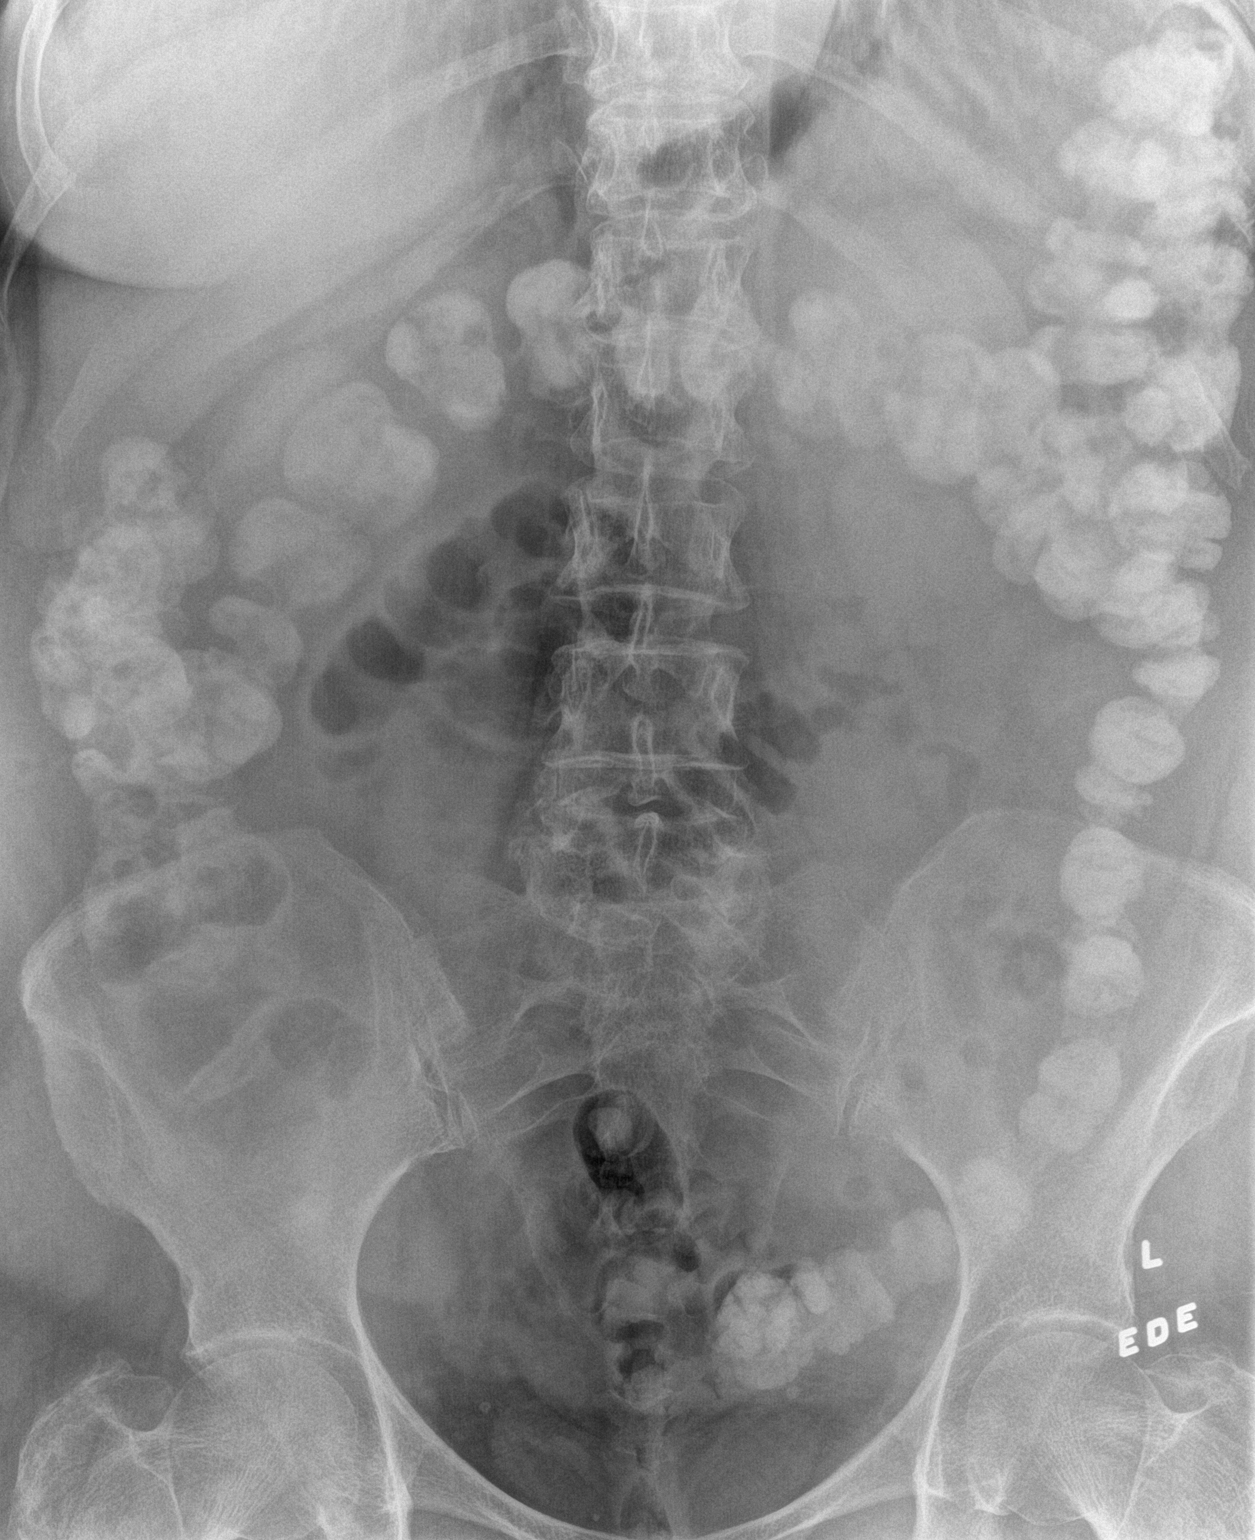

[2 of 2 positions shown; findings below may reference images not displayed]

FINDINGS: Soft tissue structures are unremarkable. Oral contrast noted
throughout the colon to the level of the rectosigmoid. No small
bowel distention noted. No free air. Small hiatal hernia. Pelvic
phleboliths appear No acute bony abnormality.
IMPRESSION: 1. Oral contrast noted throughout the colon to the level of the
rectosigmoid. No small bowel distention or free air noted on today's
exam.
2. Small hiatal hernia.

## 2014-09-28 MED ORDER — PANTOPRAZOLE SODIUM 40 MG PO TBEC
40.0000 mg | DELAYED_RELEASE_TABLET | Freq: Two times a day (BID) | ORAL | Status: DC
  Administered 2014-09-28 – 2014-09-29 (×3): 40 mg via ORAL
  Filled 2014-09-28 (×3): qty 1

## 2014-09-28 NOTE — Progress Notes (Signed)
Subjective: Pt denies abdominal complaints.  No BM.  Pt not vomiting.   Objective: Vital signs in last 24 hours: Temp:  [98.7 F (37.1 C)-99.8 F (37.7 C)] 98.7 F (37.1 C) (12/21 0603) Pulse Rate:  [75-83] 75 (12/21 0603) Resp:  [15-22] 18 (12/21 0603) BP: (104-177)/(55-72) 104/55 mmHg (12/21 0603) SpO2:  [90 %-93 %] 93 % (12/21 0603) Last BM Date: 09/27/14  Intake/Output from previous day: 12/20 0701 - 12/21 0700 In: 906.3 [I.V.:906.3] Out: -  Intake/Output this shift:    GI: soft obese ND NT  Lab Results:   Recent Labs  09/26/14 2233 09/27/14 0511  WBC 17.6* 15.8*  HGB 14.6 13.9  HCT 44.5 42.9  PLT 265 293   BMET  Recent Labs  09/26/14 2233 09/27/14 0511  NA 137 138  K 4.5 4.4  CL 98 102  CO2 27 23  GLUCOSE 150* 113*  BUN 12 10  CREATININE 0.88 0.77  CALCIUM 9.8 9.4   PT/INR No results for input(s): LABPROT, INR in the last 72 hours. ABG No results for input(s): PHART, HCO3 in the last 72 hours.  Invalid input(s): PCO2, PO2  Studies/Results: Ct Abdomen Pelvis Wo Contrast  09/27/2014   CLINICAL DATA:  Initial evaluation for acute mid upper abdominal pain. History of kidney stones, diverticulitis, gastroesophageal reflux.  EXAM: CT ABDOMEN AND PELVIS WITHOUT CONTRAST  TECHNIQUE: Multidetector CT imaging of the abdomen and pelvis was performed following the standard protocol without IV contrast.  COMPARISON:  Prior study performed earlier the same day.  FINDINGS: Bibasilar linear and patchy opacities most consistent with atelectasis. Large granuloma present within the posterior left lower lobe. No pleural or pericardial effusion.  Limited noncontrast evaluation of the liver is unremarkable. Gallbladder within normal limits. No biliary dilatation. The spleen, adrenal glands, and pancreas demonstrate a normal unenhanced appearance.  Nonobstructive 3 mm stone present within knee mid -upper right kidney. No evidence for hydronephrosis. No stones seen along  the course of the renal collecting systems bilaterally. 4.1 x 4.2 cm exophytic cyst extends from the anterior aspect of the right kidney.  Large hiatal hernia present. There are several mildly prominent fluid-filled loops of small bowel clustered within the mid abdomen measuring up to 2.6 cm in diameter. Few scattered air-fluid levels present. There is question of a focal transition point within the lower left abdomen (series 2, image 62). Wall thickening with hazy fat stranding and mesenteric edema seen within the small bowel just proximal to this point. Additionally, there is fecalization of the small bile at this level, suggesting slow transit. Findings suspicious for partial or early small bowel obstruction. Distally, the small bowel is decompressed and normal in appearance. Scattered colonic diverticular present without acute diverticulitis. Appendix is normal.  Bladder is unremarkable. Uterus within normal limits. Ovaries normal.  No free air or fluid. Small fat containing paraumbilical hernia noted. No pathologically enlarged intra-abdominal pelvic lymph nodes. Mild scatter multi focal atherosclerotic calcifications present within the intra-abdominal aorta without aneurysm.  No acute osseous abnormality. No worrisome lytic or blastic osseous lesions. Degenerative disc desiccation present at L5-S1 and T11-12.  IMPRESSION: 1. Findings concerning for partial versus early small bowel obstruction with transition point in the lower left abdomen. No evidence for perforation. 2. Colonic diverticulosis without acute diverticulitis. 3. 3 mm nonobstructive right renal calculus. 4. Large hiatal hernia.   Electronically Signed   By: Jeannine Boga M.D.   On: 09/27/2014 02:49   Dg Chest 2 View  09/27/2014   CLINICAL  DATA:  Cough  EXAM: CHEST  2 VIEW  COMPARISON:  CT abdomen pelvis 09/27/2014  FINDINGS: Normal cardiac silhouette. Mild basilar atelectasis. No focal infiltrate. There is a calcified granuloma noted  in the left lung base measuring 19 mm.  IMPRESSION: Bibasilar atelectasis.  No evidence for infiltrate.   Electronically Signed   By: Suzy Bouchard M.D.   On: 09/27/2014 15:29   Dg Abd 2 Views  09/28/2014   CLINICAL DATA:  Partial small bowel obstruction.  EXAM: ABDOMEN - 2 VIEW  COMPARISON:  CT 09/27/2014.  Abdomen 09/27/2014.  FINDINGS: Soft tissue structures are unremarkable. Oral contrast noted throughout the colon to the level of the rectosigmoid. No small bowel distention noted. No free air. Small hiatal hernia. Pelvic phleboliths appear No acute bony abnormality.  IMPRESSION: 1. Oral contrast noted throughout the colon to the level of the rectosigmoid. No small bowel distention or free air noted on today's exam. 2. Small hiatal hernia.   Electronically Signed   By: Marcello Moores  Register   On: 09/28/2014 07:55   Dg Abd Acute W/chest  09/27/2014   CLINICAL DATA:  Epigastric pain.  EXAM: ACUTE ABDOMEN SERIES (ABDOMEN 2 VIEW & CHEST 1 VIEW)  COMPARISON:  Chest radiograph 05/04/2014.  FINDINGS: There is no evidence of dilated bowel loops or free intraperitoneal air. No radiopaque calculi or other significant radiographic abnormality is seen. Heart size and mediastinal contours are within normal limits. Both lungs are clear.  Moderate-sized hiatal hernia with air-fluid level is redemonstrated. Tortuous aorta.  IMPRESSION: Negative abdominal radiographs.  No acute cardiopulmonary disease.  Moderate-sized hiatal hernia with air-fluid level. Correlate clinically for relation of this finding to epigastric pain   Electronically Signed   By: Rolla Flatten M.D.   On: 09/27/2014 00:27    Anti-infectives: Anti-infectives    Start     Dose/Rate Route Frequency Ordered Stop   09/28/14 1000  azithromycin (ZITHROMAX) tablet 250 mg     250 mg Oral Daily 09/27/14 1012 10/02/14 0959   09/27/14 1100  azithromycin (ZITHROMAX) tablet 500 mg     500 mg Oral Daily 09/27/14 1012 09/27/14 1304       Assessment/Plan: Abdominal pain/  SBO       improved    Plain films show no obstruction and contrast in the colon\advance diet as tolerated   Will sign off   LOS: 2 days    Falon Flinchum A. 09/28/2014

## 2014-09-28 NOTE — Progress Notes (Signed)
Pt ambulated with pt about 145ft.  Pt denied any weakenss or dizziness.  Gait steady.

## 2014-09-28 NOTE — Care Management Note (Unsigned)
    Page 1 of 1   09/28/2014     6:21:27 PM CARE MANAGEMENT NOTE 09/28/2014  Patient:  Katie Weber, Katie Weber   Account Number:  [id number]  Date Initiated:  09/28/2014  Documentation initiated by:  Tomi Bamberger  Subjective/Objective Assessment:   dx abd pain n/v, cough, sbo  admit- from home     Action/Plan:   Anticipated DC Date:  09/29/2014   Anticipated DC Plan:  [place]  CM consult      Choice offered to / List presented to:             Status of service:  In process, will continue to follow Medicare Important Message given?  YES (If response is "NO", the following Medicare IM given date fields will be blank) Date Medicare IM given:  09/28/2014 Medicare IM given by:  Tomi Bamberger Date Additional Medicare IM given:   Additional Medicare IM given by:    Discharge Disposition:    Per UR Regulation:  Reviewed for med. necessity/level of care/duration of stay  If discussed at [place] of Stay Meetings, dates discussed:    Comments:  09/28/14 [place], BSN [phone] patient from home, NCM will cont to follow for dc needs.

## 2014-09-28 NOTE — Progress Notes (Signed)
PROGRESS NOTE    Kiarah Eckstein PJK:[id number] DOB: 24-Feb-1946 DOA: 09/26/2014 PCP: Florina Ou, MD  HPI/Brief narrative 68 y.o. female with a history of Hypothyroid, GERD who presents to the ED with complaints of nausea, vomiting, abdominal pain and distention. Chest and abdominal x-ray were negative. CT abdomen suggested SBO. Patient recently completed a course of Z-Pak and steroids for acute bronchitis.    Assessment/Plan:  1. SBO: Treated supportively with nothing by mouth, IV fluids and serial imaging. Surgery consultation appreciated and are not convinced of significant SBO. Follow-up KUB> no SBO. Resolved. Diet as tolerated. 2. Acute bronchitis/cough: With posttussive emesis. Patient had been having significant cough with intermittent creamy sputum and small emesis on 12/21. Repeat chest x-ray: no PNA. Started Z-Pak and Tessalon pearls. Will add when necessary Tussionex. No clinical bronchospasm-hold steroids for now. Improving. 3. GERD/hiatal hernia: PPI 4. History of hypothyroid 5. Leukocytosis: Likely stress response. Follow CBCs   Code Status: Full Family Communication: Discussed with spouse on 12/21 Disposition Plan: Home when medically stable- likely 12/22   Consultants:  General surgery  Procedures:  None  Antibiotics:  Z-Pak 12/20 >   Subjective: This much better. Had BM last night followed by resolution of abdominal distention, pain. No nausea. Cough has significantly improved and no further vomiting.  Objective: Filed Vitals:   09/28/14 0151 09/28/14 0603 09/28/14 1152 09/28/14 1415  BP: 105/56 104/55 108/52 123/55  Pulse: 82 75  71  Temp: 98.8 F (37.1 C) 98.7 F (37.1 C) 98.1 F (36.7 C) 98.1 F (36.7 C)  TempSrc: Oral Oral Oral Oral  Resp: 20 18 15 16   Height:      Weight:      SpO2: 90% 93% 93% 95%    Intake/Output Summary (Last 24 hours) at 09/28/14 1519 Last data filed at 09/27/14 1850  Gross per 24 hour  Intake  512.5 ml  Output       0 ml  Net  512.5 ml   Filed Weights   09/27/14 0501  Weight: 97.115 kg (214 lb 1.6 oz)     Exam:  General exam: Moderately built and obese female lying comfortably in bed. Respiratory system: Clear to auscultation. No increased work of breathing. Cardiovascular system: S1 & S2 heard, RRR. No JVD, murmurs, gallops, clicks or pedal edema. Gastrointestinal system: Abdomen is non distended/obese, soft and nontender. Normal bowel sounds heard. Central nervous system: Alert and oriented. No focal neurological deficits. Extremities: Symmetric 5 x 5 power.   Data Reviewed: Basic Metabolic Panel:  Recent Labs Lab 09/26/14 2233 09/27/14 0511  NA 137 138  K 4.5 4.4  CL 98 102  CO2 27 23  GLUCOSE 150* 113*  BUN 12 10  CREATININE 0.88 0.77  CALCIUM 9.8 9.4   Liver Function Tests:  Recent Labs Lab 09/26/14 2233  AST 22  ALT 28  ALKPHOS 91  BILITOT 0.5  PROT 7.4  ALBUMIN 3.7    Recent Labs Lab 09/26/14 2233  LIPASE 40   No results for input(s): AMMONIA in the last 168 hours. CBC:  Recent Labs Lab 09/26/14 2233 09/27/14 0511  WBC 17.6* 15.8*  NEUTROABS 15.1*  --   HGB 14.6 13.9  HCT 44.5 42.9  MCV 92.3 92.5  PLT 265 293   Cardiac Enzymes: No results for input(s): CKTOTAL, CKMB, CKMBINDEX, TROPONINI in the last 168 hours. BNP (last 3 results) No results for input(s): PROBNP in the last 8760 hours. CBG: No results for input(s): GLUCAP in the last 168  hours.  No results found for this or any previous visit (from the past 240 hour(s)).        Studies: Ct Abdomen Pelvis Wo Contrast  09/27/2014   CLINICAL DATA:  Initial evaluation for acute mid upper abdominal pain. History of kidney stones, diverticulitis, gastroesophageal reflux.  EXAM: CT ABDOMEN AND PELVIS WITHOUT CONTRAST  TECHNIQUE: Multidetector CT imaging of the abdomen and pelvis was performed following the standard protocol without IV contrast.  COMPARISON:  Prior study performed earlier  the same day.  FINDINGS: Bibasilar linear and patchy opacities most consistent with atelectasis. Large granuloma present within the posterior left lower lobe. No pleural or pericardial effusion.  Limited noncontrast evaluation of the liver is unremarkable. Gallbladder within normal limits. No biliary dilatation. The spleen, adrenal glands, and pancreas demonstrate a normal unenhanced appearance.  Nonobstructive 3 mm stone present within knee mid -upper right kidney. No evidence for hydronephrosis. No stones seen along the course of the renal collecting systems bilaterally. 4.1 x 4.2 cm exophytic cyst extends from the anterior aspect of the right kidney.  Large hiatal hernia present. There are several mildly prominent fluid-filled loops of small bowel clustered within the mid abdomen measuring up to 2.6 cm in diameter. Few scattered air-fluid levels present. There is question of a focal transition point within the lower left abdomen (series 2, image 62). Wall thickening with hazy fat stranding and mesenteric edema seen within the small bowel just proximal to this point. Additionally, there is fecalization of the small bile at this level, suggesting slow transit. Findings suspicious for partial or early small bowel obstruction. Distally, the small bowel is decompressed and normal in appearance. Scattered colonic diverticular present without acute diverticulitis. Appendix is normal.  Bladder is unremarkable. Uterus within normal limits. Ovaries normal.  No free air or fluid. Small fat containing paraumbilical hernia noted. No pathologically enlarged intra-abdominal pelvic lymph nodes. Mild scatter multi focal atherosclerotic calcifications present within the intra-abdominal aorta without aneurysm.  No acute osseous abnormality. No worrisome lytic or blastic osseous lesions. Degenerative disc desiccation present at L5-S1 and T11-12.  IMPRESSION: 1. Findings concerning for partial versus early small bowel obstruction  with transition point in the lower left abdomen. No evidence for perforation. 2. Colonic diverticulosis without acute diverticulitis. 3. 3 mm nonobstructive right renal calculus. 4. Large hiatal hernia.   Electronically Signed   By: Jeannine Boga M.D.   On: 09/27/2014 02:49   Dg Chest 2 View  09/27/2014   CLINICAL DATA:  Cough  EXAM: CHEST  2 VIEW  COMPARISON:  CT abdomen pelvis 09/27/2014  FINDINGS: Normal cardiac silhouette. Mild basilar atelectasis. No focal infiltrate. There is a calcified granuloma noted in the left lung base measuring 19 mm.  IMPRESSION: Bibasilar atelectasis.  No evidence for infiltrate.   Electronically Signed   By: Suzy Bouchard M.D.   On: 09/27/2014 15:29   Dg Abd 2 Views  09/28/2014   CLINICAL DATA:  Partial small bowel obstruction.  EXAM: ABDOMEN - 2 VIEW  COMPARISON:  CT 09/27/2014.  Abdomen 09/27/2014.  FINDINGS: Soft tissue structures are unremarkable. Oral contrast noted throughout the colon to the level of the rectosigmoid. No small bowel distention noted. No free air. Small hiatal hernia. Pelvic phleboliths appear No acute bony abnormality.  IMPRESSION: 1. Oral contrast noted throughout the colon to the level of the rectosigmoid. No small bowel distention or free air noted on today's exam. 2. Small hiatal hernia.   Electronically Signed   By: Marcello Moores  Register  On: 09/28/2014 07:55   Dg Abd Acute W/chest  09/27/2014   CLINICAL DATA:  Epigastric pain.  EXAM: ACUTE ABDOMEN SERIES (ABDOMEN 2 VIEW & CHEST 1 VIEW)  COMPARISON:  Chest radiograph 05/04/2014.  FINDINGS: There is no evidence of dilated bowel loops or free intraperitoneal air. No radiopaque calculi or other significant radiographic abnormality is seen. Heart size and mediastinal contours are within normal limits. Both lungs are clear.  Moderate-sized hiatal hernia with air-fluid level is redemonstrated. Tortuous aorta.  IMPRESSION: Negative abdominal radiographs.  No acute cardiopulmonary disease.   Moderate-sized hiatal hernia with air-fluid level. Correlate clinically for relation of this finding to epigastric pain   Electronically Signed   By: Rolla Flatten M.D.   On: 09/27/2014 00:27        Scheduled Meds: . azithromycin  250 mg Oral Daily  . benzonatate  200 mg Oral TID  . enoxaparin (LOVENOX) injection  40 mg Subcutaneous Q24H  . pantoprazole  40 mg Oral BID   Continuous Infusions: . sodium chloride 75 mL/hr at 09/28/14 0418    Principal Problem:   SBO (small bowel obstruction) Active Problems:   Nausea and vomiting   Abdominal pain, epigastric   Hypothyroidism   Leukocytosis    Time spent: 55 minutes    Versie Fleener, MD, FACP, FHM. [facility] Pager [phone]  If 7PM-7AM, please contact night-coverage [email or web address] Password [email or web address] 09/28/2014, 3:19 PM    LOS: 2 days

## 2014-09-29 ENCOUNTER — Inpatient Hospital Stay (HOSPITAL_COMMUNITY): Payer: Medicare Other

## 2014-09-29 DIAGNOSIS — J209 Acute bronchitis, unspecified: Secondary | ICD-10-CM | POA: Diagnosis present

## 2014-09-29 LAB — CBC
HCT: 40.8 % (ref 36.0–46.0)
Hemoglobin: 12.9 g/dL (ref 12.0–15.0)
MCH: 30.4 pg (ref 26.0–34.0)
MCHC: 31.6 g/dL (ref 30.0–36.0)
MCV: 96.2 fL (ref 78.0–100.0)
Platelets: 240 10*3/uL (ref 150–400)
RBC: 4.24 MIL/uL (ref 3.87–5.11)
RDW: 14 % (ref 11.5–15.5)
WBC: 10.8 10*3/uL — ABNORMAL HIGH (ref 4.0–10.5)

## 2014-09-29 IMAGING — CR DG CHEST 2V
2 series · 2 of 2 positions shown · non-contrast
Comparison: [DATE]

CLINICAL DATA: Wheezing and cough for 4 days

EXAM:
CHEST  2 VIEW

[chest pa]
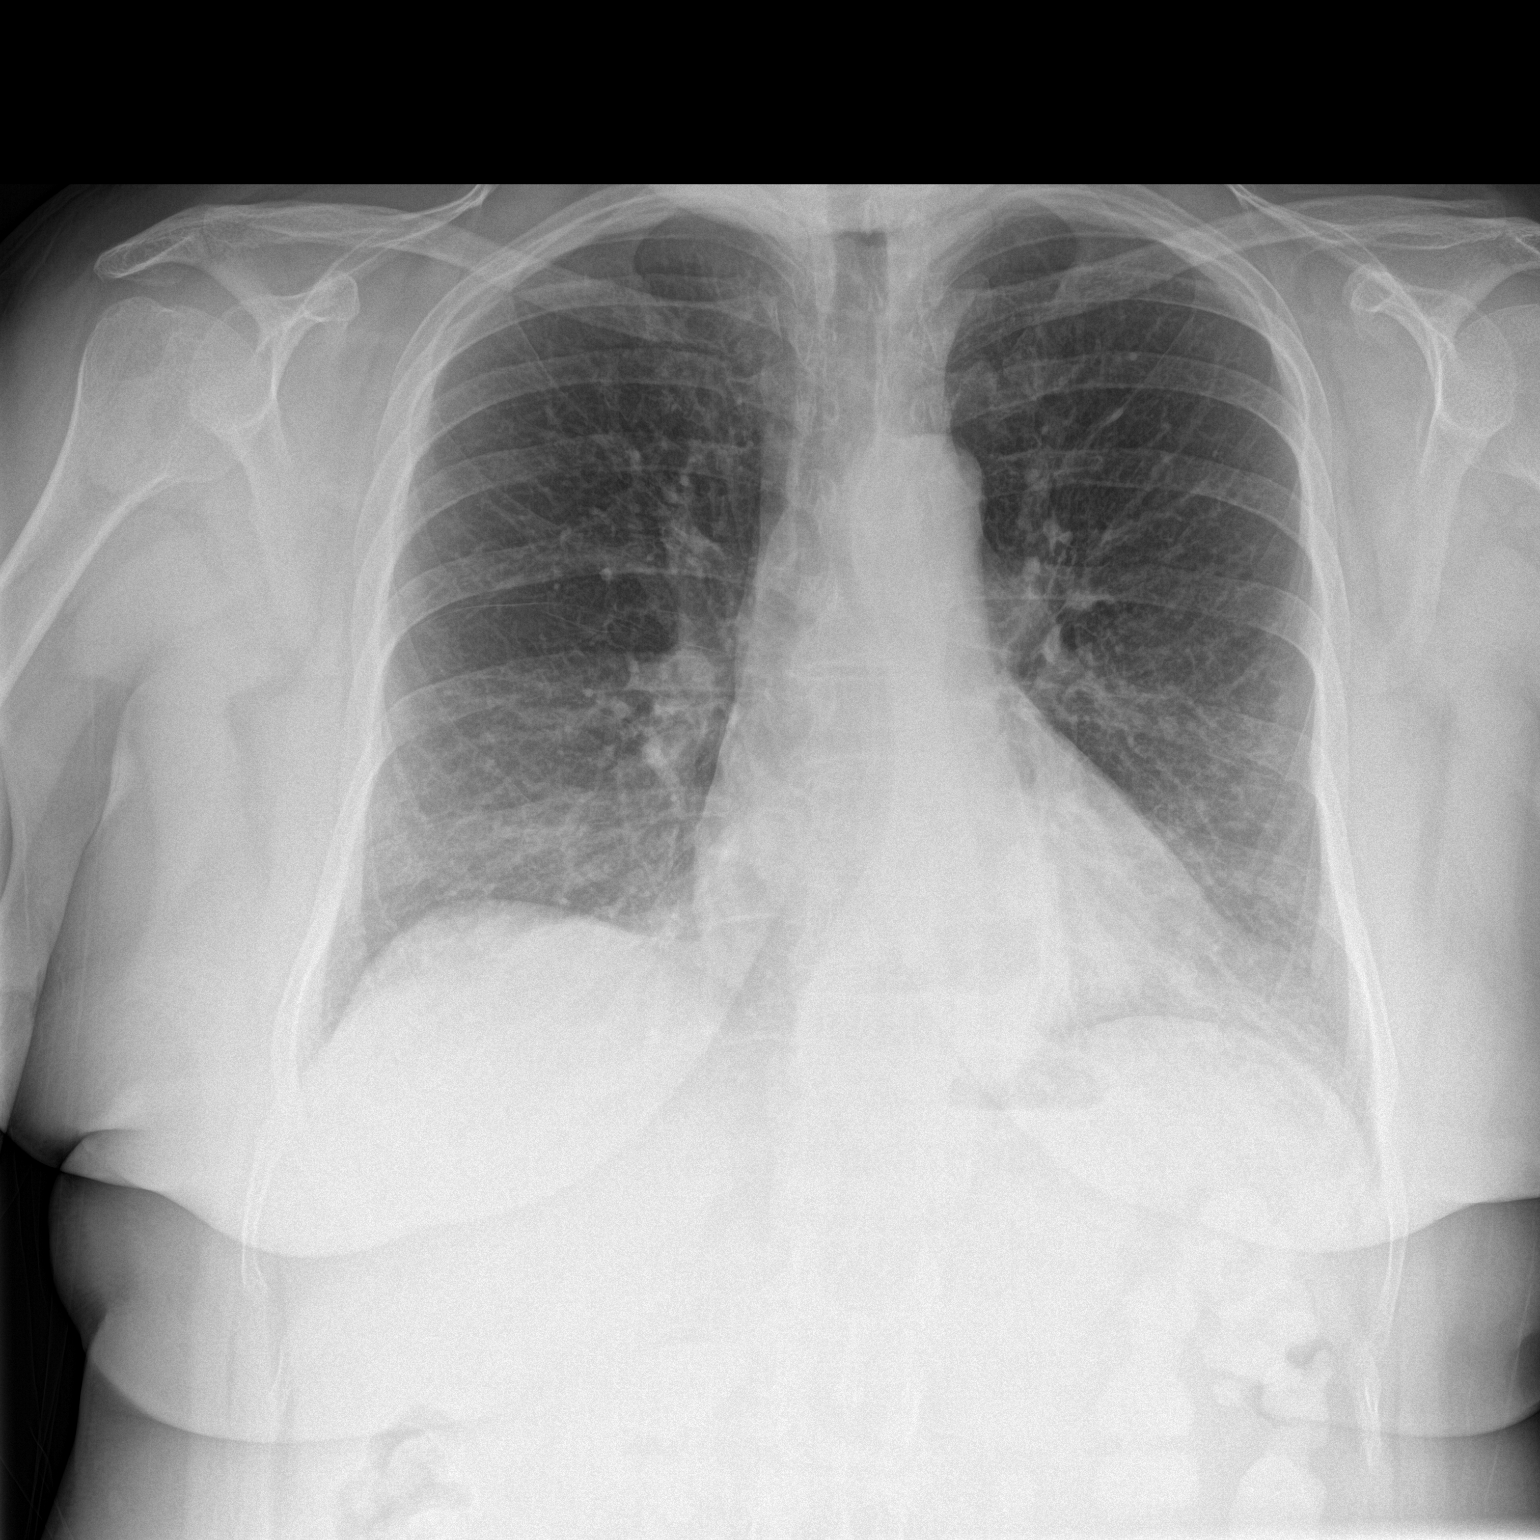

[chest lat]
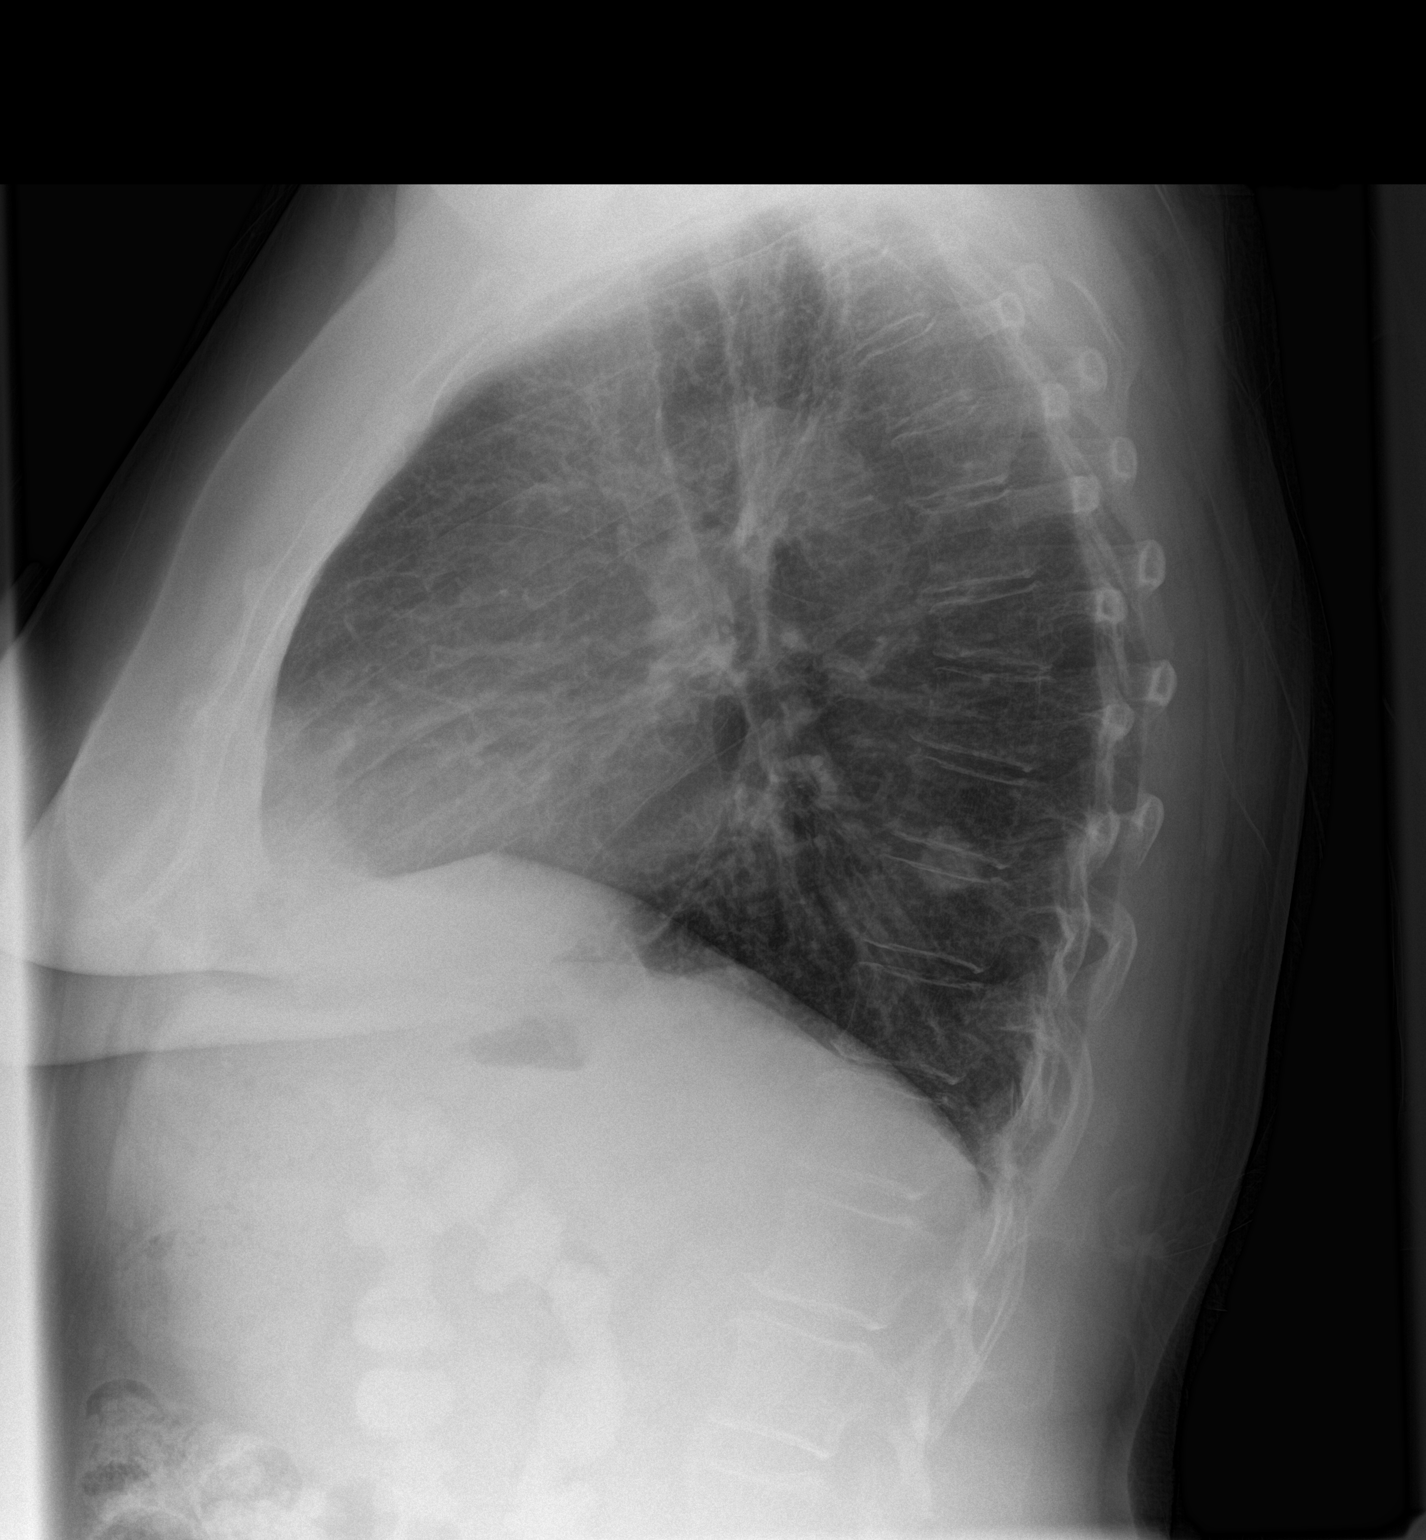

[2 of 2 positions shown; findings below may reference images not displayed]

FINDINGS: Calcified granuloma on left posteriorly, seen on the lateral view,
is stable. There is no lung edema or consolidation. Heart size and
pulmonary vascularity are normal. No adenopathy. There is a hiatal
hernia. There is stable anterior wedging of a mid thoracic vertebral
body.
IMPRESSION: No edema or consolidation. Calcified granuloma on lateral view.
Hiatal hernia present. Stable anterior wedging mid thoracic
vertebral body.

## 2014-09-29 MED ORDER — AZITHROMYCIN 250 MG PO TABS: ORAL_TABLET | ORAL | Status: DC

## 2014-09-29 MED ORDER — GUAIFENESIN ER 600 MG PO TB12: 1200.0000 mg | ORAL_TABLET | Freq: Two times a day (BID) | ORAL | Status: DC

## 2014-09-29 MED ORDER — BENZONATATE 200 MG PO CAPS: 200.0000 mg | ORAL_CAPSULE | Freq: Three times a day (TID) | ORAL | Status: DC

## 2014-09-29 NOTE — Discharge Summary (Signed)
Physician Discharge Summary  Katie Weber FOY:[id number] DOB: 1945/11/15 DOA: 09/26/2014  PCP: Florina Ou, MD  Admit date: 09/26/2014 Discharge date: 09/29/2014  Time spent: 35 minutes  Recommendations for Outpatient Follow-up:  1. Patient with SBO symptoms and bronchitis. 2. CBC, BMET at PCP follow up.   Discharge Diagnoses:  Principal Problem:   SBO (small bowel obstruction) Active Problems:   Nausea and vomiting   Abdominal pain, epigastric   Hypothyroidism   Leukocytosis   Acute bronchitis   Discharge Condition: stable.  Diet recommendation: soft solids.  Filed Weights   09/27/14 0501  Weight: 97.115 kg (214 lb 1.6 oz)    History of present illness:  68 y.o. female with a history of Hypothyroid, GERD who presents to the ED with complaints of nausea, vomiting, abdominal pain and distention. Chest and abdominal x-ray were negative. CT abdomen suggested SBO. Patient recently completed a course of Z-Pak and steroids for acute bronchitis.   Hospital Course:  SBO: Treated supportively with nothing by mouth, IV fluids and serial imaging. Surgery consulted, and did not feel patient had a significant SBO. Follow-up KUB the next morning showed no SBO. Resolved. Narcotic pain medicines and Tussionex discouraged. Tolerated soft solid diet well.  Acute bronchitis/cough: With posttussive emesis. Patient had been having significant cough with intermittent creamy sputum and small emesis on 12/21. Repeat chest x-ray: no PNA. Started Z-Pak and Tessalon pearls.  No clinical bronchospasm-hold steroids for now. Improving.  We will discharge to home with 4 more days of a azithromycin by mouth.  GERD/small hiatal hernia: PPI  History of hypothyroid  Leukocytosis: Likely stress response.  Trending down well.    Procedures: None  Consultations: [place] surgery    Discharge Exam: Filed Vitals:   09/28/14 1859 09/28/14 2210 09/29/14 0224 09/29/14 0550  BP: 145/72  104/50 109/54 130/74  Pulse: 96 84 81 80  Temp: 98.6 F (37 C) 98.8 F (37.1 C) 98.5 F (36.9 C) 98.7 F (37.1 C)  TempSrc: Oral Oral Oral Oral  Resp: 15 15 15 15   Height:      Weight:      SpO2: 91% 93% 96% 91%    General: Well-developed, well-nourished, obese female, sitting up in bed. No apparent distress Cardiovascular: Regular rate and rhythm, no murmurs rubs or gallops Respiratory: Good air movement bilaterally, minimal expiratory wheezes on the posterior left Abdomen: Soft, nontender, nondistended, positive bowel sounds, no masses Extremities: Able to ambulate easily about the room  Discharge Instructions   Discharge Instructions    Diet - low sodium heart healthy    Complete by:  As directed      Increase activity slowly    Complete by:  As directed           Current Discharge Medication List    START taking these medications   Details  azithromycin (ZITHROMAX) 250 MG tablet Take one tablet daily until gone. Qty: 4 each, Refills: 0    benzonatate (TESSALON) 200 MG capsule Take 1 capsule (200 mg total) by mouth 3 (three) times daily. Qty: 20 capsule, Refills: 0    guaiFENesin (MUCINEX) 600 MG 12 hr tablet Take 2 tablets (1,200 mg total) by mouth 2 (two) times daily.    ranitidine (ZANTAC) 150 MG capsule Take 1 capsule (150 mg total) by mouth 2 (two) times daily. Qty: 10 capsule, Refills: 0      CONTINUE these medications which have NOT CHANGED   Details  albuterol (PROVENTIL HFA;VENTOLIN HFA) 108 (90 BASE) MCG/ACT  inhaler Inhale 2 puffs into the lungs every 6 (six) hours as needed for wheezing or shortness of breath (seasonal allergies).    fluticasone (FLONASE) 50 MCG/ACT nasal spray Place 1 spray into both nostrils daily as needed for allergies or rhinitis (seasonal allergies).    levothyroxine (SYNTHROID, LEVOTHROID) 100 MCG tablet Take 100 mcg by mouth daily before breakfast.    loratadine (CLARITIN) 10 MG tablet Take 10 mg by mouth daily as needed  for allergies (seasonal allergies).    omeprazole (PRILOSEC) 20 MG capsule Take 20 mg by mouth 2 (two) times daily before a meal.  Refills: 0    tretinoin (RETIN-A) 0.025 % gel Apply 1 application topically every other day. Takes at bedtime      STOP taking these medications     ibuprofen (ADVIL,MOTRIN) 800 MG tablet      oxyCODONE-acetaminophen (ROXICET) 5-325 MG per tablet        Allergies  Allergen Reactions  . Contrast Media [Iodinated Diagnostic Agents] Shortness Of Breath and Other (See Comments)    Tightness in chest. Trouble breathing  . Penicillins Hives   Follow-up Information    Follow up with Florina Ou, MD. Schedule an appointment as soon as possible for a visit in 3 days.   Specialty:  Family Medicine   Why:  for close follow up of your abdominal pain and bronchitis.   Contact information:   [address] [place] [address] [phone]        The results of significant diagnostics from this hospitalization (including imaging, microbiology, ancillary and laboratory) are listed below for reference.    Significant Diagnostic Studies: Ct Abdomen Pelvis Wo Contrast  09/27/2014   CLINICAL DATA:  Initial evaluation for acute mid upper abdominal pain. History of kidney stones, diverticulitis, gastroesophageal reflux.  EXAM: CT ABDOMEN AND PELVIS WITHOUT CONTRAST  TECHNIQUE: Multidetector CT imaging of the abdomen and pelvis was performed following the standard protocol without IV contrast.  COMPARISON:  Prior study performed earlier the same day.  FINDINGS: Bibasilar linear and patchy opacities most consistent with atelectasis. Large granuloma present within the posterior left lower lobe. No pleural or pericardial effusion.  Limited noncontrast evaluation of the liver is unremarkable. Gallbladder within normal limits. No biliary dilatation. The spleen, adrenal glands, and pancreas demonstrate a normal unenhanced appearance.   Nonobstructive 3 mm stone present within knee mid -upper right kidney. No evidence for hydronephrosis. No stones seen along the course of the renal collecting systems bilaterally. 4.1 x 4.2 cm exophytic cyst extends from the anterior aspect of the right kidney.  Large hiatal hernia present. There are several mildly prominent fluid-filled loops of small bowel clustered within the mid abdomen measuring up to 2.6 cm in diameter. Few scattered air-fluid levels present. There is question of a focal transition point within the lower left abdomen (series 2, image 62). Wall thickening with hazy fat stranding and mesenteric edema seen within the small bowel just proximal to this point. Additionally, there is fecalization of the small bile at this level, suggesting slow transit. Findings suspicious for partial or early small bowel obstruction. Distally, the small bowel is decompressed and normal in appearance. Scattered colonic diverticular present without acute diverticulitis. Appendix is normal.  Bladder is unremarkable. Uterus within normal limits. Ovaries normal.  No free air or fluid. Small fat containing paraumbilical hernia noted. No pathologically enlarged intra-abdominal pelvic lymph nodes. Mild scatter multi focal atherosclerotic calcifications present within the intra-abdominal aorta without aneurysm.  No acute osseous abnormality. No worrisome lytic or blastic osseous lesions. Degenerative disc desiccation present at L5-S1 and T11-12.  IMPRESSION: 1. Findings concerning for partial versus early small bowel obstruction with transition point in the lower left abdomen. No evidence for perforation. 2. Colonic diverticulosis without acute diverticulitis. 3. 3 mm nonobstructive right renal calculus. 4. Large hiatal hernia.   Electronically Signed   By: Jeannine Boga M.D.   On: 09/27/2014 02:49   Dg Chest 2 View  09/27/2014   CLINICAL DATA:  Cough  EXAM: CHEST  2 VIEW  COMPARISON:  CT abdomen pelvis 09/27/2014   FINDINGS: Normal cardiac silhouette. Mild basilar atelectasis. No focal infiltrate. There is a calcified granuloma noted in the left lung base measuring 19 mm.  IMPRESSION: Bibasilar atelectasis.  No evidence for infiltrate.   Electronically Signed   By: Suzy Bouchard M.D.   On: 09/27/2014 15:29   Dg Abd 2 Views  09/28/2014   CLINICAL DATA:  Partial small bowel obstruction.  EXAM: ABDOMEN - 2 VIEW  COMPARISON:  CT 09/27/2014.  Abdomen 09/27/2014.  FINDINGS: Soft tissue structures are unremarkable. Oral contrast noted throughout the colon to the level of the rectosigmoid. No small bowel distention noted. No free air. Small hiatal hernia. Pelvic phleboliths appear No acute bony abnormality.  IMPRESSION: 1. Oral contrast noted throughout the colon to the level of the rectosigmoid. No small bowel distention or free air noted on today's exam. 2. Small hiatal hernia.   Electronically Signed   By: Marcello Moores  Register   On: 09/28/2014 07:55   Dg Abd Acute W/chest  09/27/2014   CLINICAL DATA:  Epigastric pain.  EXAM: ACUTE ABDOMEN SERIES (ABDOMEN 2 VIEW & CHEST 1 VIEW)  COMPARISON:  Chest radiograph 05/04/2014.  FINDINGS: There is no evidence of dilated bowel loops or free intraperitoneal air. No radiopaque calculi or other significant radiographic abnormality is seen. Heart size and mediastinal contours are within normal limits. Both lungs are clear.  Moderate-sized hiatal hernia with air-fluid level is redemonstrated. Tortuous aorta.  IMPRESSION: Negative abdominal radiographs.  No acute cardiopulmonary disease.  Moderate-sized hiatal hernia with air-fluid level. Correlate clinically for relation of this finding to epigastric pain   Electronically Signed   By: Rolla Flatten M.D.   On: 09/27/2014 00:27    Labs: Basic Metabolic Panel:  Recent Labs Lab 09/26/14 2233 09/27/14 0511  NA 137 138  K 4.5 4.4  CL 98 102  CO2 27 23  GLUCOSE 150* 113*  BUN 12 10  CREATININE 0.88 0.77  CALCIUM 9.8 9.4   Liver  Function Tests:  Recent Labs Lab 09/26/14 2233  AST 22  ALT 28  ALKPHOS 91  BILITOT 0.5  PROT 7.4  ALBUMIN 3.7    Recent Labs Lab 09/26/14 2233  LIPASE 40    CBC:  Recent Labs Lab 09/26/14 2233 09/27/14 0511 09/29/14 0602  WBC 17.6* 15.8* 10.8*  NEUTROABS 15.1*  --   --   HGB 14.6 13.9 12.9  HCT 44.5 42.9 40.8  MCV 92.3 92.5 96.2  PLT 265 293 240    Signed:  Melton Alar, PA-C  [facility] 09/29/2014, 11:08 AM

## 2014-10-05 DIAGNOSIS — R109 Unspecified abdominal pain: Secondary | ICD-10-CM | POA: Diagnosis not present

## 2014-10-05 DIAGNOSIS — R05 Cough: Secondary | ICD-10-CM | POA: Diagnosis not present

## 2015-07-07 DIAGNOSIS — H2513 Age-related nuclear cataract, bilateral: Secondary | ICD-10-CM | POA: Diagnosis not present

## 2015-10-16 ENCOUNTER — Encounter (HOSPITAL_COMMUNITY): Payer: Self-pay | Admitting: Emergency Medicine

## 2015-10-16 ENCOUNTER — Emergency Department (HOSPITAL_COMMUNITY)
Admission: EM | Admit: 2015-10-16 | Discharge: 2015-10-16 | Disposition: A | Discharge status: Home / Self Care | Payer: Medicare Other | Attending: Emergency Medicine | Admitting: Emergency Medicine

## 2015-10-16 ENCOUNTER — Emergency Department (HOSPITAL_COMMUNITY): Payer: Medicare Other

## 2015-10-16 DIAGNOSIS — E039 Hypothyroidism, unspecified: Secondary | ICD-10-CM | POA: Diagnosis not present

## 2015-10-16 DIAGNOSIS — R1031 Right lower quadrant pain: Secondary | ICD-10-CM | POA: Diagnosis not present

## 2015-10-16 DIAGNOSIS — Z79899 Other long term (current) drug therapy: Secondary | ICD-10-CM | POA: Insufficient documentation

## 2015-10-16 DIAGNOSIS — Z8739 Personal history of other diseases of the musculoskeletal system and connective tissue: Secondary | ICD-10-CM | POA: Insufficient documentation

## 2015-10-16 DIAGNOSIS — Z792 Long term (current) use of antibiotics: Secondary | ICD-10-CM | POA: Insufficient documentation

## 2015-10-16 DIAGNOSIS — Z8701 Personal history of pneumonia (recurrent): Secondary | ICD-10-CM | POA: Diagnosis not present

## 2015-10-16 DIAGNOSIS — Z87442 Personal history of urinary calculi: Secondary | ICD-10-CM | POA: Diagnosis not present

## 2015-10-16 DIAGNOSIS — Z9851 Tubal ligation status: Secondary | ICD-10-CM | POA: Insufficient documentation

## 2015-10-16 DIAGNOSIS — R109 Unspecified abdominal pain: Secondary | ICD-10-CM

## 2015-10-16 DIAGNOSIS — K529 Noninfective gastroenteritis and colitis, unspecified: Secondary | ICD-10-CM | POA: Insufficient documentation

## 2015-10-16 DIAGNOSIS — Z8719 Personal history of other diseases of the digestive system: Secondary | ICD-10-CM

## 2015-10-16 DIAGNOSIS — Z880 Allergy status to penicillin: Secondary | ICD-10-CM | POA: Diagnosis not present

## 2015-10-16 DIAGNOSIS — R1032 Left lower quadrant pain: Secondary | ICD-10-CM | POA: Diagnosis not present

## 2015-10-16 DIAGNOSIS — K449 Diaphragmatic hernia without obstruction or gangrene: Secondary | ICD-10-CM | POA: Diagnosis not present

## 2015-10-16 LAB — CBC WITH DIFFERENTIAL/PLATELET
BASOS ABS: 0.1 10*3/uL (ref 0.0–0.1)
BASOS PCT: 0 %
Eosinophils Absolute: 0.1 10*3/uL (ref 0.0–0.7)
Eosinophils Relative: 0 %
HEMATOCRIT: 44.1 % (ref 36.0–46.0)
HEMOGLOBIN: 14.5 g/dL (ref 12.0–15.0)
LYMPHS PCT: 10 %
Lymphs Abs: 1.8 10*3/uL (ref 0.7–4.0)
MCH: 30.8 pg (ref 26.0–34.0)
MCHC: 32.9 g/dL (ref 30.0–36.0)
MCV: 93.6 fL (ref 78.0–100.0)
Monocytes Absolute: 2.3 10*3/uL — ABNORMAL HIGH (ref 0.1–1.0)
Monocytes Relative: 12 %
NEUTROS PCT: 77 %
Neutro Abs: 14.1 10*3/uL — ABNORMAL HIGH (ref 1.7–7.7)
Platelets: 249 10*3/uL (ref 150–400)
RBC: 4.71 MIL/uL (ref 3.87–5.11)
RDW: 13.4 % (ref 11.5–15.5)
WBC: 18.1 10*3/uL — ABNORMAL HIGH (ref 4.0–10.5)

## 2015-10-16 LAB — URINALYSIS, ROUTINE W REFLEX MICROSCOPIC
Bilirubin Urine: NEGATIVE
GLUCOSE, UA: NEGATIVE mg/dL
KETONES UR: 15 mg/dL — AB
NITRITE: NEGATIVE
PROTEIN: NEGATIVE mg/dL
Specific Gravity, Urine: 1.023 (ref 1.005–1.030)
pH: 5.5 (ref 5.0–8.0)

## 2015-10-16 LAB — LIPASE, BLOOD: LIPASE: 26 U/L (ref 11–51)

## 2015-10-16 LAB — COMPREHENSIVE METABOLIC PANEL
ALBUMIN: 3.7 g/dL (ref 3.5–5.0)
ALK PHOS: 85 U/L (ref 38–126)
ALT: 24 U/L (ref 14–54)
AST: 22 U/L (ref 15–41)
Anion gap: 11 (ref 5–15)
BILIRUBIN TOTAL: 1.2 mg/dL (ref 0.3–1.2)
CO2: 24 mmol/L (ref 22–32)
CREATININE: 0.93 mg/dL (ref 0.44–1.00)
Calcium: 9.5 mg/dL (ref 8.9–10.3)
Chloride: 103 mmol/L (ref 101–111)
GFR calc Af Amer: >60 mL/min (ref >60)
Glucose, Bld: 129 mg/dL — ABNORMAL HIGH (ref 65–99)
Potassium: 4.7 mmol/L (ref 3.5–5.1)
Sodium: 138 mmol/L (ref 135–145)
TOTAL PROTEIN: 6.7 g/dL (ref 6.5–8.1)

## 2015-10-16 LAB — URINE MICROSCOPIC-ADD ON

## 2015-10-16 LAB — I-STAT CG4 LACTIC ACID, ED: LACTIC ACID, VENOUS: 1.96 mmol/L (ref 0.5–2.0)

## 2015-10-16 IMAGING — CT CT ABD-PELV W/O CM
2 of 4 series · 16 of 46 positions shown, 18 images · non-contrast
Comparison: CT of the abdomen pelvis dated [DATE]

CLINICAL DATA: Pelvic pain, nausea, vomiting and fever.

EXAM:
CT ABDOMEN AND PELVIS WITHOUT CONTRAST
TECHNIQUE: Multidetector CT imaging of the abdomen and pelvis was performed
following the standard protocol without IV contrast.

[Series 2: a/p w/o 5mm · axial · non-contrast · 0.79mm/px · z∈[-455,-20]mm · 13 of 95 slices shown, 15 images]
[im 4/95  soft-tissue]
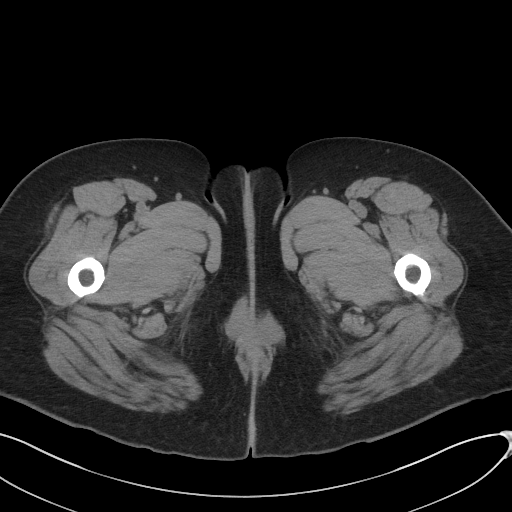
[im 4/95  bone]
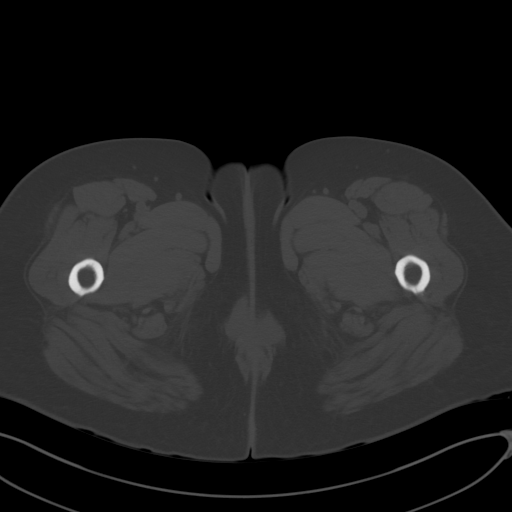
[im 12/95  soft-tissue]
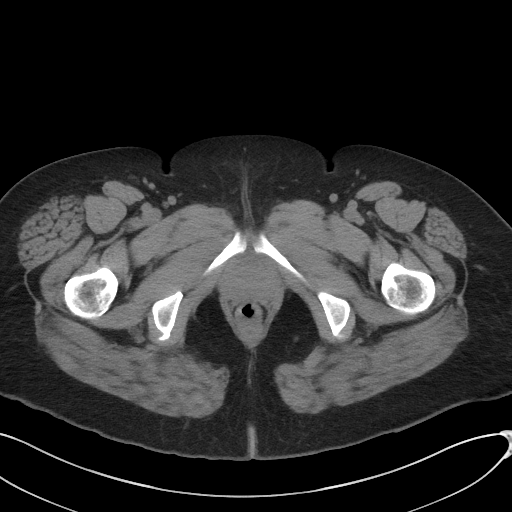
[im 20/95  soft-tissue]
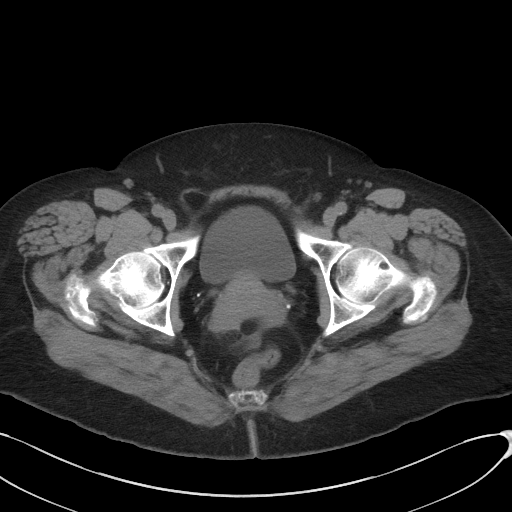
[im 28/95  soft-tissue]
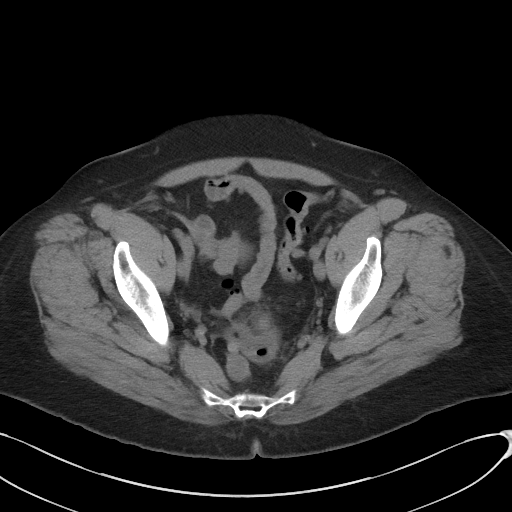
[im 32/95  soft-tissue]
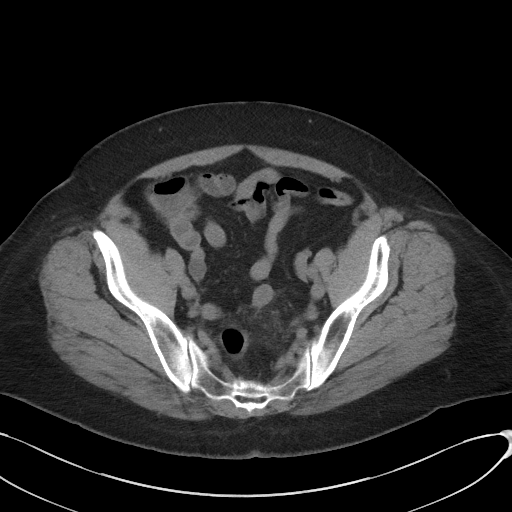
[im 40/95  soft-tissue]
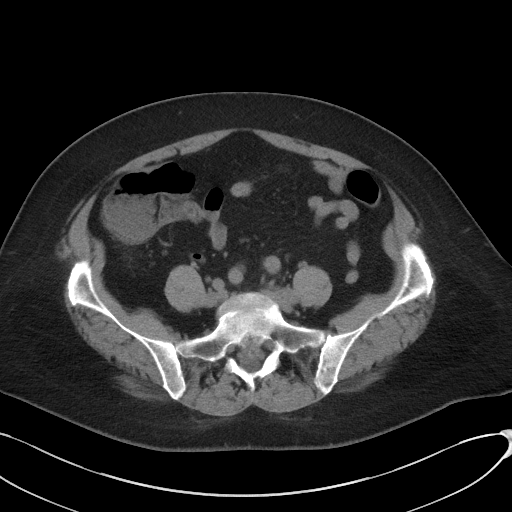
[im 48/95  soft-tissue]
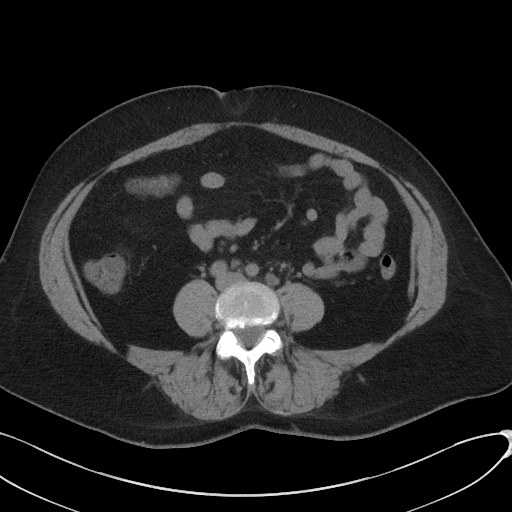
[im 55/95  soft-tissue]
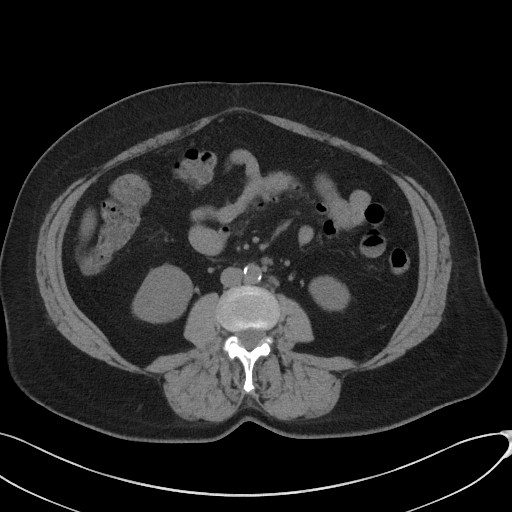
[im 63/95  soft-tissue]
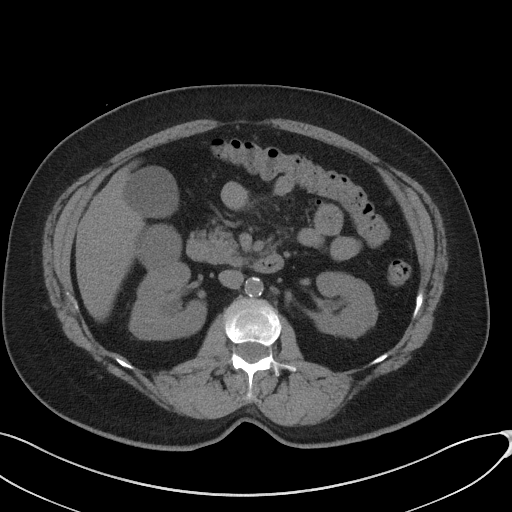
[im 63/95  bone]
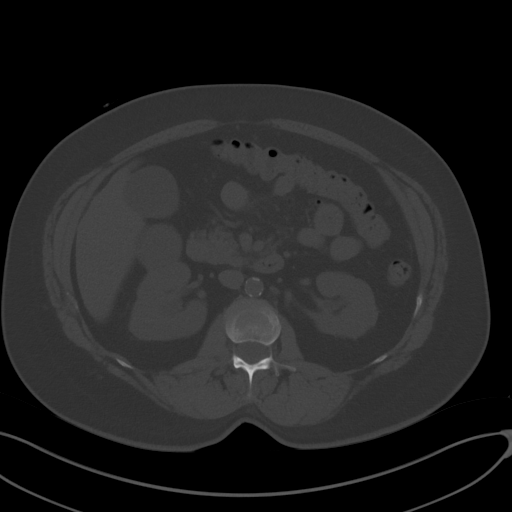
[im 67/95  soft-tissue]
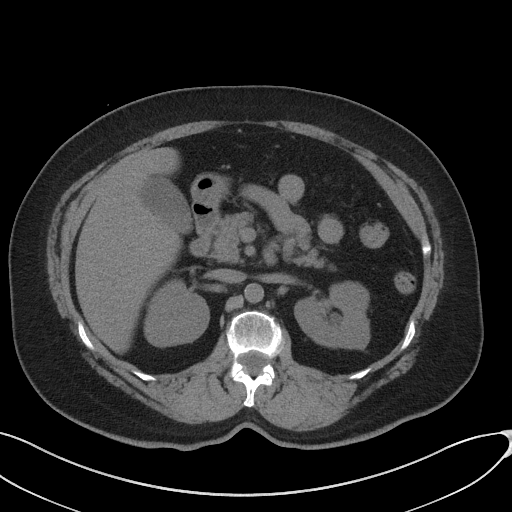
[im 75/95  soft-tissue]
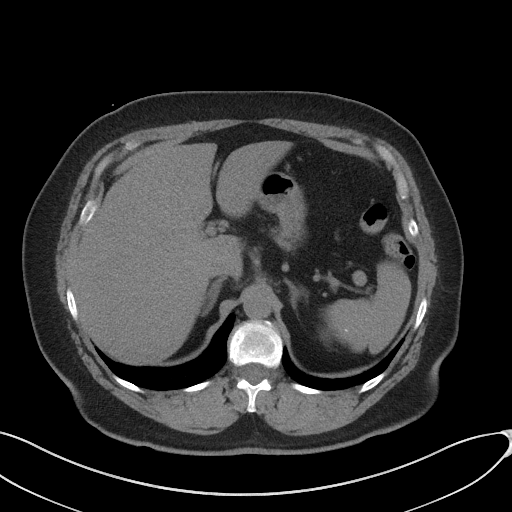
[im 83/95  soft-tissue]
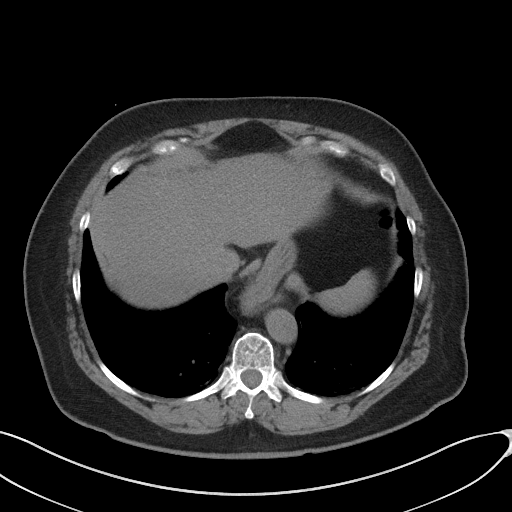
[im 91/95  soft-tissue]
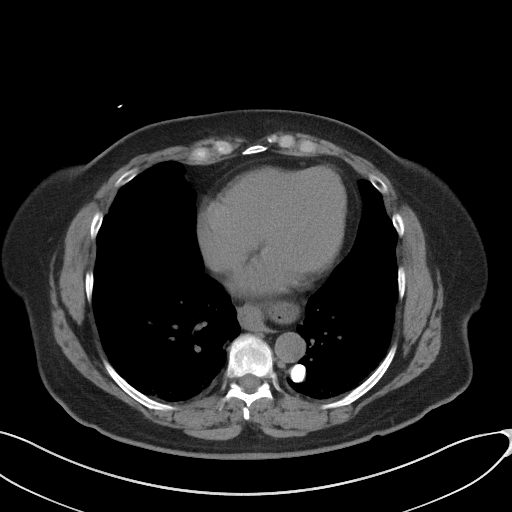

[Series 5: a/p w/o cor · coronal · non-contrast · 0.82mm/px · 3 of 138 slices shown]
[im 46/138  soft-tissue]
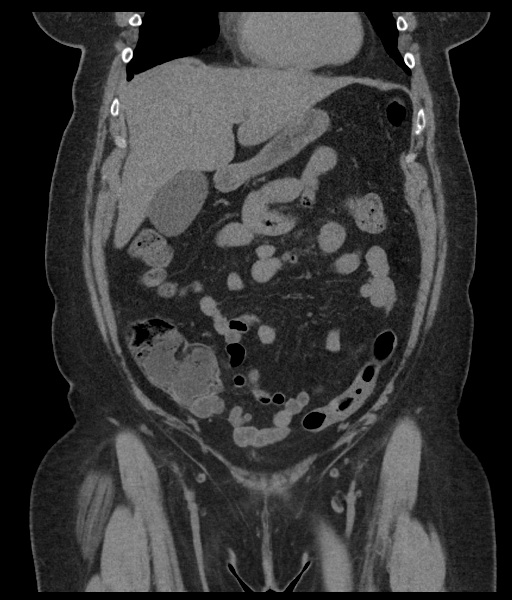
[im 61/138  soft-tissue]
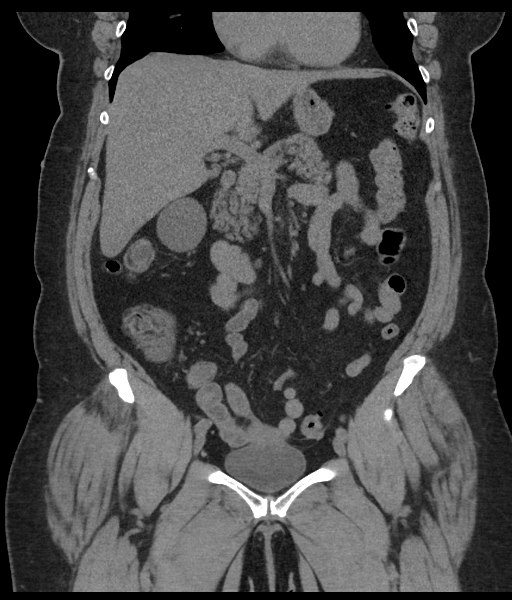
[im 77/138  soft-tissue]
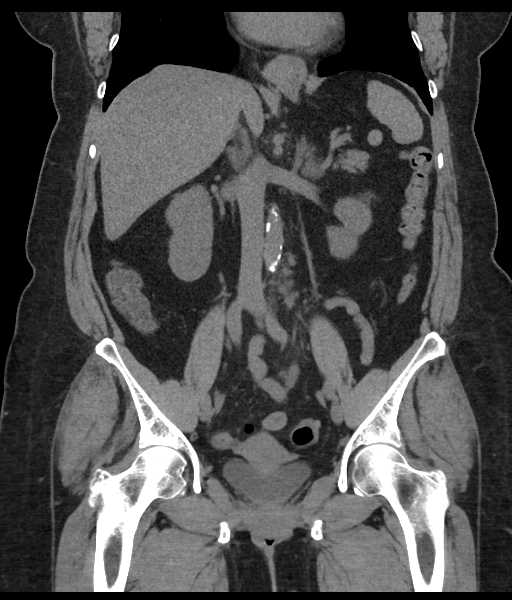

[16 of 46 positions shown; findings below may reference images not displayed]

FINDINGS: Lower chest: Dependent atelectatic changes are seen. There is a
large hiatal hernia.

Hepatobiliary: No mass visualized on this un-enhanced exam.

Pancreas: No mass or inflammatory process identified on this
un-enhanced exam.

Spleen: Within normal limits in size. Scattered granulomata are
seen.

Adrenals/Urinary Tract: No evidence of urolithiasis or
hydronephrosis. No definite mass visualized on this un-enhanced
exam. There is a mostly exophytic 4.4 cm cyst off of the superior
anterior cortex of the right kidney.

Stomach/Bowel: No evidence of obstruction. The appendix is normal.
There is left colonic diverticulosis. There is an asymmetric wall
thickening with adjacent inflammatory pericolonic fat stranding of
the distal sigmoid colon involving approximately 7 cm of the bowel.

Vascular/Lymphatic: No pathologically enlarged lymph nodes. No
evidence of abdominal aortic aneurysm. Mild atherosclerotic disease
of the abdominal aorta.

Reproductive: No mass or other significant abnormality.

Other: None.

Musculoskeletal: No suspicious bone lesions identified. Chronic L5
pars articularis defects, with minimal anterolisthesis of L5 on S1
are seen. There mild multilevel osteoarthritic changes of the
lumbosacral spine.
IMPRESSION: Long segment of asymmetric wall thickening of the distal sigmoid
colon, with adjacent inflammatory changes. Differential diagnosis
includes diverticulitis, colitis, or less likely malignancy.
Background of scattered colonic diverticulosis.

Large hiatal hernia.

Benign-appearing exophytic 4.4 cm right renal cyst.

Mild atherosclerotic disease of the abdominal aorta.

Chronic L5 pars articularis defects. Mild multilevel osteoarthritic
changes of the spine.

## 2015-10-16 MED ORDER — CIPROFLOXACIN HCL 500 MG PO TABS
500.0000 mg | ORAL_TABLET | Freq: Once | ORAL | Status: AC
  Administered 2015-10-16: 500 mg via ORAL
  Filled 2015-10-16: qty 1

## 2015-10-16 MED ORDER — METRONIDAZOLE 500 MG PO TABS: 500.0000 mg | ORAL_TABLET | Freq: Two times a day (BID) | ORAL | Status: DC

## 2015-10-16 MED ORDER — ONDANSETRON HCL 4 MG/2ML IJ SOLN
4.0000 mg | Freq: Once | INTRAMUSCULAR | Status: AC
  Administered 2015-10-16: 4 mg via INTRAVENOUS
  Filled 2015-10-16: qty 2

## 2015-10-16 MED ORDER — METRONIDAZOLE 500 MG PO TABS
500.0000 mg | ORAL_TABLET | Freq: Once | ORAL | Status: AC
  Administered 2015-10-16: 500 mg via ORAL
  Filled 2015-10-16: qty 1

## 2015-10-16 MED ORDER — CIPROFLOXACIN HCL 500 MG PO TABS: 500.0000 mg | ORAL_TABLET | Freq: Two times a day (BID) | ORAL | Status: DC

## 2015-10-16 MED ORDER — SODIUM CHLORIDE 0.9 % IV SOLN
1000.0000 mL | Freq: Once | INTRAVENOUS | Status: AC
  Administered 2015-10-16: 1000 mL via INTRAVENOUS

## 2015-10-16 MED ORDER — MORPHINE SULFATE (PF) 4 MG/ML IV SOLN
4.0000 mg | Freq: Once | INTRAVENOUS | Status: AC
  Administered 2015-10-16: 4 mg via INTRAVENOUS
  Filled 2015-10-16: qty 1

## 2015-10-16 MED ORDER — ONDANSETRON 4 MG PO TBDP: 4.0000 mg | ORAL_TABLET | Freq: Three times a day (TID) | ORAL | Status: DC | PRN

## 2015-10-16 MED ORDER — OXYCODONE HCL 5 MG PO TABS: 5.0000 mg | ORAL_TABLET | ORAL | Status: DC | PRN

## 2015-10-16 NOTE — ED Provider Notes (Signed)
CSN: [id number]     Arrival date & time 10/16/15  1639 History   First MD Initiated Contact with Patient 10/16/15 1641     Chief Complaint  Patient presents with  . Abdominal Pain     (Consider location/radiation/quality/duration/timing/severity/associated sxs/prior Treatment) Patient is a 70 y.o. female presenting with abdominal pain. The history is provided by the patient.  Abdominal Pain Pain location:  Suprapubic, LLQ and RLQ Pain quality: sharp and shooting   Pain radiates to:  Does not radiate Pain severity:  Moderate Onset quality:  Gradual Duration:  2 days Timing:  Constant Progression:  Worsening Chronicity:  Recurrent Relieved by:  Urination Worsened by:  Nothing tried Ineffective treatments:  None tried Associated symptoms: nausea and vomiting   Associated symptoms: no chest pain, no chills, no dysuria, no fever and no shortness of breath   Multiple surgeries: tubal ligation.     70 yo F with a chief complaint of lower abdominal pain. The started couple days ago. Started about the suprapubic region and now is radiating up bilaterally. Pain is there constantly. Has had fevers high as 102 at home. Some nausea vomiting and diarrhea as well. Patient symptoms are relieved with urination. Patient denies dysuria or increased frequency or hesitancy. Has had symptoms similar to this about a year ago. Patient at that time was found to have a possible partial small bowel obstruction and was placed in the hospital for observation. This resolved on its own. No noted etiology.  Past Medical History  Diagnosis Date  . Hypothyroidism   . Pneumonia 2014  . GERD (gastroesophageal reflux disease)     no meds  . Diverticulitis     no problems now  . 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  . Family history of anesthesia complication 0000000 ago    allergic reaction to atropine  . History of kidney stones     '80's  lithotripsy-[facility] x1  . Arthritis     arthritis fingers   Past Surgical History  Procedure Laterality Date  . Lithotripsy    . Tubal ligation    . Hysteroscopy w/d&c N/A 04/20/2014    Procedure: DILATATION AND CURETTAGE /HYSTEROSCOPY;  Surgeon: [name], MD;  Location: [facility];  Service: Gynecology;  Laterality: N/A;  . Dilation and curettage of uterus      04-20-14 [facility] hospital endometrial polyp removed.  . Breast surgery Right     '72 -benign tumor  . Childbirth- nvd x2     . Colonoscopy with propofol N/A 05/04/2014    Procedure: COLONOSCOPY WITH PROPOFOL;  Surgeon: Garlan Fair, MD;  Location: [facility];  Service: Endoscopy;  Laterality: N/A;  . Esophagogastroduodenoscopy (egd) with propofol N/A 05/04/2014    Procedure: ESOPHAGOGASTRODUODENOSCOPY (EGD) WITH PROPOFOL;  Surgeon: Garlan Fair, MD;  Location: [facility];  Service: Endoscopy;  Laterality: N/A;  . Balloon dilation N/A 05/04/2014    Procedure: BALLOON DILATION;  Surgeon: Garlan Fair, MD;  Location: [facility];  Service: Endoscopy;  Laterality: N/A;   No family history on file. Social History  Substance Use Topics  . Smoking status: Never Smoker   . Smokeless tobacco: None  . Alcohol Use: Yes     Comment: rare social   OB History    No data available     Review of Systems  Constitutional: Negative for fever and chills.  HENT: Negative for congestion and rhinorrhea.  Eyes: Negative for redness and visual disturbance.  Respiratory: Negative for shortness of breath and wheezing.   Cardiovascular: Negative for chest pain and palpitations.  Gastrointestinal: Positive for nausea, vomiting and abdominal pain.  Genitourinary: Negative for dysuria and urgency.  Musculoskeletal: Negative for myalgias and arthralgias.  Skin: Negative for pallor and wound.  Neurological: Negative for dizziness and headaches.      Allergies  Contrast media and Penicillins  Home Medications   Prior to Admission  medications   Medication Sig Start Date End Date Taking? Authorizing Provider  levothyroxine (SYNTHROID, LEVOTHROID) 100 MCG tablet Take 100 mcg by mouth daily before breakfast.   Yes Historical Provider, MD  azithromycin (ZITHROMAX) 250 MG tablet Take one tablet daily until gone. 09/29/14   Bobby Rumpf York, PA-C  benzonatate (TESSALON) 200 MG capsule Take 1 capsule (200 mg total) by mouth 3 (three) times daily. 09/29/14   Bobby Rumpf York, PA-C  guaiFENesin (MUCINEX) 600 MG 12 hr tablet Take 2 tablets (1,200 mg total) by mouth 2 (two) times daily. 09/29/14   Marianne L York, PA-C   BP 114/75 mmHg  Pulse 81  Temp(Src) 99.8 F (37.7 C) (Oral)  Resp 16  Ht 5\' 4"  (1.626 m)  Wt 200 lb (90.719 kg)  BMI 34.31 kg/m2  SpO2 90% Physical Exam  Constitutional: She is oriented to person, place, and time. She appears well-developed and well-nourished. No distress.  HENT:  Head: Normocephalic and atraumatic.  Eyes: EOM are normal. Pupils are equal, round, and reactive to light.  Neck: Normal range of motion. Neck supple.  Cardiovascular: Normal rate and regular rhythm.  Exam reveals no gallop and no friction rub.   No murmur heard. Pulmonary/Chest: Effort normal. She has no wheezes. She has no rales.  Abdominal: Soft. She exhibits no distension. There is tenderness (mild suprapubic and LL and RLQ TTP). There is no rebound and no guarding.  Musculoskeletal: She exhibits no edema or tenderness.  Neurological: She is alert and oriented to person, place, and time.  Skin: Skin is warm and dry. She is not diaphoretic.  Psychiatric: She has a normal mood and affect. Her behavior is normal.  Nursing note and vitals reviewed.   ED Course  Procedures (including critical care time) Labs Review Labs Reviewed  CBC WITH DIFFERENTIAL/PLATELET  COMPREHENSIVE METABOLIC PANEL  LIPASE, BLOOD  URINALYSIS, ROUTINE W REFLEX MICROSCOPIC (NOT AT [facility])  I-STAT CG4 LACTIC ACID, ED    Imaging Review No results  found. I have personally reviewed and evaluated these images and lab results as part of my medical decision-making.   EKG Interpretation None      MDM   Final diagnoses:  Colitis    70 yo F with a chief complaint of lower abdominal pain. Patient has a history of a questionable partial small bowel obstruction. Will obtain a CT scan. Patient had some chest tightness on a previous CT scan. She denied any rash or hypotension with that. This is listed as a contrast dye allergy in her chart. Will not give her contrast at this time. Will obtain a CT without.  CT scan concerning for colitis. Radiology also read as possible malignancy. With patient's history of sudden onset lower abdominal pain with nausea and diarrhea feel colitis more likely. Will treat with Cipro Flagyl. PCP follow-up.   I have discussed the diagnosis/risks/treatment options with the patient and family and believe the pt to be eligible for discharge home to follow-up with PCP. We also discussed returning to the  ED immediately if new or worsening sx occur. We discussed the sx which are most concerning (e.g., sudden worsening pain, fever, inability to tolerate by mouth) that necessitate immediate return. Medications administered to the patient during their visit and any new prescriptions provided to the patient are listed below.  Medications given during this visit Medications  0.9 %  sodium chloride infusion (0 mLs Intravenous Stopped 10/16/15 1855)  morphine 4 MG/ML injection 4 mg (4 mg Intravenous Given 10/16/15 1710)  ondansetron (ZOFRAN) injection 4 mg (4 mg Intravenous Given 10/16/15 1710)  ciprofloxacin (CIPRO) tablet 500 mg (500 mg Oral Given 10/16/15 1925)  metroNIDAZOLE (FLAGYL) tablet 500 mg (500 mg Oral Given 10/16/15 1925)    Discharge Medication List as of 10/16/2015  7:13 PM    START taking these medications   Details  ciprofloxacin (CIPRO) 500 MG tablet Take 1 tablet (500 mg total) by mouth 2 (two) times daily., Starting  10/16/2015, Until Discontinued, Print    metroNIDAZOLE (FLAGYL) 500 MG tablet Take 1 tablet (500 mg total) by mouth 2 (two) times daily., Starting 10/16/2015, Until Discontinued, Print    ondansetron (ZOFRAN ODT) 4 MG disintegrating tablet Take 1 tablet (4 mg total) by mouth every 8 (eight) hours as needed for nausea or vomiting., Starting 10/16/2015, Until Discontinued, Print    oxyCODONE (ROXICODONE) 5 MG immediate release tablet Take 1 tablet (5 mg total) by mouth every 4 (four) hours as needed for severe pain., Starting 10/16/2015, Until Discontinued, Print        The patient appears reasonably screen and/or stabilized for discharge and I doubt any other medical condition or other [facility] requiring further screening, evaluation, or treatment in the ED at this time prior to discharge.    Deno Etienne, DO 10/16/15 2217

## 2015-10-16 NOTE — ED Notes (Signed)
Pt in reporting lower abd pain tender upon palpation, nausea, pt has low grade fever,

## 2015-10-16 NOTE — Discharge Instructions (Signed)
Follow up with your family doctor.  Return for sudden worsening pain, high persistent fever, or inability to eat or drink.  Colitis Colitis is inflammation of the colon. Colitis may last a short time (acute) or it may last a long time (chronic). CAUSES This condition may be caused by:  Viruses.  Bacteria.  Reactions to medicine.  Certain autoimmune diseases, such as Crohn disease or ulcerative colitis. SYMPTOMS Symptoms of this condition include:  Diarrhea.  Passing bloody or tarry stool.  Pain.  Fever.  Vomiting.  Tiredness (fatigue).  Weight loss.  Bloating.  Sudden increase in abdominal pain.  Having fewer bowel movements than usual. DIAGNOSIS This condition is diagnosed with a stool test or a blood test. You may also have other tests, including X-rays, a CT scan, or a colonoscopy. TREATMENT Treatment may include:  Resting the bowel. This involves not eating or drinking for a period of time.  Fluids that are given through an IV tube.  Medicine for pain and diarrhea.  Antibiotic medicines.  Cortisone medicines.  Surgery. HOME CARE INSTRUCTIONS Eating and Drinking  Follow instructions from your health care provider about eating or drinking restrictions.  Drink enough fluid to keep your urine clear or pale yellow.  Work with a dietitian to determine which foods cause your condition to flare up.  Avoid foods that cause flare-ups.  Eat a well-balanced diet. Medicines  Take over-the-counter and prescription medicines only as told by your health care provider.  If you were prescribed an antibiotic medicine, take it as told by your health care provider. Do not stop taking the antibiotic even if you start to feel better. General Instructions  Keep all follow-up visits as told by your health care provider. This is important. SEEK MEDICAL CARE IF:  Your symptoms do not go away.  You develop new symptoms. SEEK IMMEDIATE MEDICAL CARE IF:  You have a  fever that does not go away with treatment.  You develop chills.  You have extreme weakness, fainting, or dehydration.  You have repeated vomiting.  You develop severe pain in your abdomen.  You pass bloody or tarry stool.   This information is not intended to replace advice given to you by your health care provider. Make sure you discuss any questions you have with your health care provider.   Document Released: 11/02/2004 Document Revised: 06/16/2015 Document Reviewed: 01/18/2015 Elsevier Interactive Patient Education [employer].

## 2015-10-27 DIAGNOSIS — R938 Abnormal findings on diagnostic imaging of other specified body structures: Secondary | ICD-10-CM | POA: Diagnosis not present

## 2015-11-29 DIAGNOSIS — Z23 Encounter for immunization: Secondary | ICD-10-CM | POA: Diagnosis not present

## 2015-11-29 DIAGNOSIS — M8580 Other specified disorders of bone density and structure, unspecified site: Secondary | ICD-10-CM | POA: Diagnosis not present

## 2015-11-29 DIAGNOSIS — E559 Vitamin D deficiency, unspecified: Secondary | ICD-10-CM | POA: Diagnosis not present

## 2015-11-29 DIAGNOSIS — K219 Gastro-esophageal reflux disease without esophagitis: Secondary | ICD-10-CM | POA: Diagnosis not present

## 2015-11-29 DIAGNOSIS — Z119 Encounter for screening for infectious and parasitic diseases, unspecified: Secondary | ICD-10-CM | POA: Diagnosis not present

## 2015-11-29 DIAGNOSIS — E039 Hypothyroidism, unspecified: Secondary | ICD-10-CM | POA: Diagnosis not present

## 2015-11-29 DIAGNOSIS — K222 Esophageal obstruction: Secondary | ICD-10-CM | POA: Diagnosis not present

## 2015-12-08 ENCOUNTER — Other Ambulatory Visit: Payer: Self-pay | Admitting: Gastroenterology

## 2015-12-08 DIAGNOSIS — R1310 Dysphagia, unspecified: Secondary | ICD-10-CM

## 2015-12-14 ENCOUNTER — Ambulatory Visit
Admission: RE | Admit: 2015-12-14 | Discharge: 2015-12-14 | Disposition: A | Discharge status: Home / Self Care | Payer: Medicare Other | Source: Ambulatory Visit | Attending: Gastroenterology | Admitting: Gastroenterology

## 2015-12-14 DIAGNOSIS — R1310 Dysphagia, unspecified: Secondary | ICD-10-CM

## 2015-12-14 DIAGNOSIS — E559 Vitamin D deficiency, unspecified: Secondary | ICD-10-CM | POA: Diagnosis not present

## 2015-12-14 DIAGNOSIS — E7800 Pure hypercholesterolemia, unspecified: Secondary | ICD-10-CM | POA: Diagnosis not present

## 2015-12-14 DIAGNOSIS — M8588 Other specified disorders of bone density and structure, other site: Secondary | ICD-10-CM | POA: Diagnosis not present

## 2015-12-14 DIAGNOSIS — K449 Diaphragmatic hernia without obstruction or gangrene: Secondary | ICD-10-CM | POA: Diagnosis not present

## 2015-12-14 IMAGING — RF DG ESOPHAGUS
14 of 17 series · 19 of 24 positions shown · non-contrast
Comparison: None.

CLINICAL DATA: Dysphagia

EXAM:
ESOPHOGRAM / BARIUM SWALLOW / BARIUM TABLET STUDY
TECHNIQUE: Combined double contrast and single contrast examination performed
using effervescent crystals, thick barium liquid, and thin barium
liquid. The patient was observed with fluoroscopy swallowing a 13 mm
barium sulphate tablet.
FLUOROSCOPY TIME:  Radiation Exposure Index (as provided by the
fluoroscopic device): 73 deciGy per square cm
If the device does not provide the exposure index:
Fluoroscopy Time:  1 minutes 54 second
Number of Acquired Images:

[Series 2: run · 1 of 1 slices shown (1 of 14)]
[im 1/1]
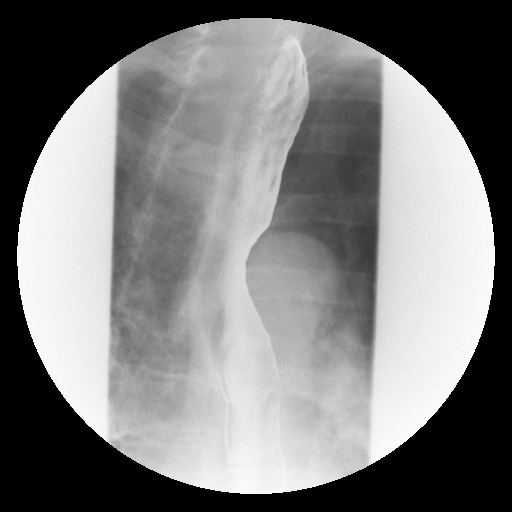

[Series 4: run · 1 of 1 slices shown (2 of 14)]
[im 1/1]
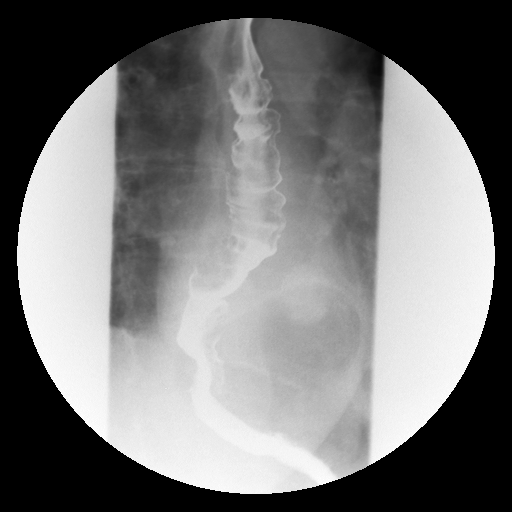

[Series 6: run · 1 of 1 slices shown (3 of 14)]
[im 1/1]
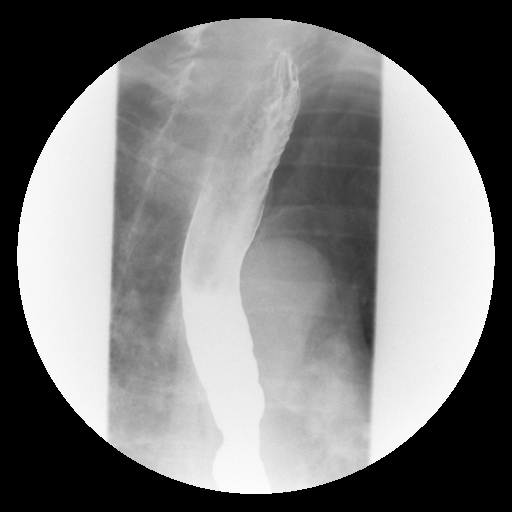

[Series 7: run · 1 of 1 slices shown (4 of 14)]
[im 1/1]
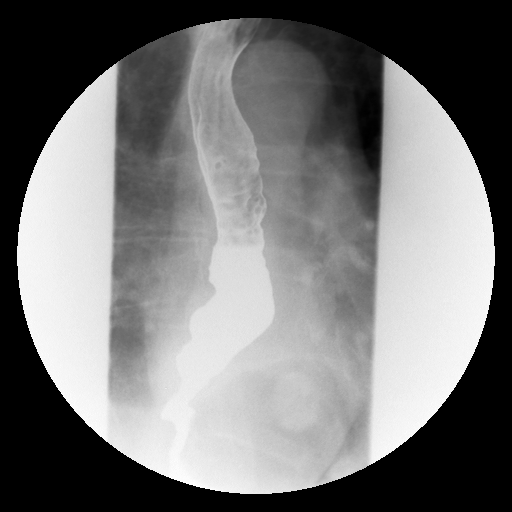

[Series 8: run · 3 of 9 slices shown (5 of 14)]
[im 1/9]
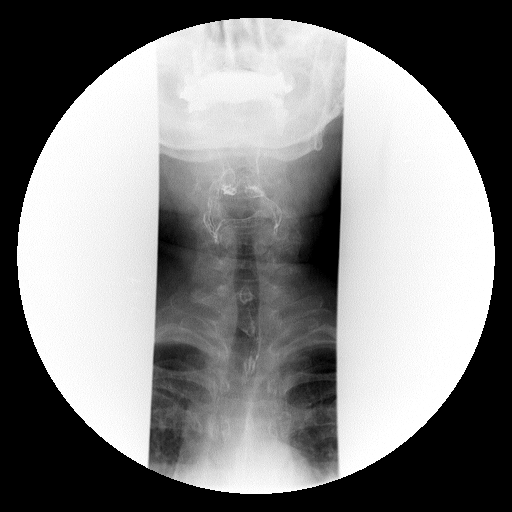
[im 3/9]
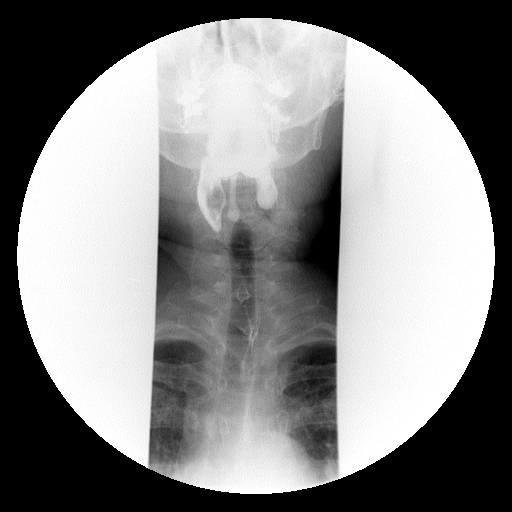
[im 9/9]
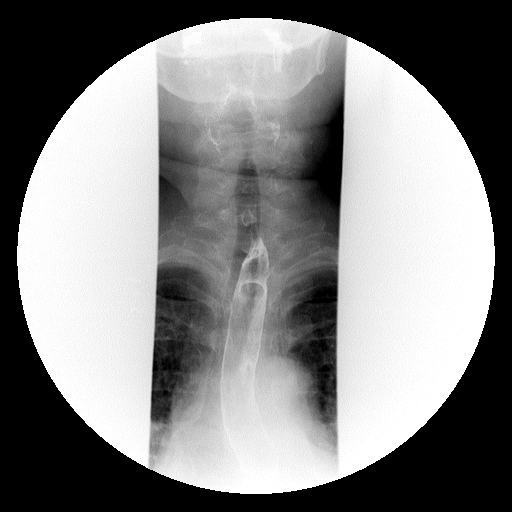

[Series 9: run · 4 of 9 slices shown (6 of 14)]
[im 1/9]
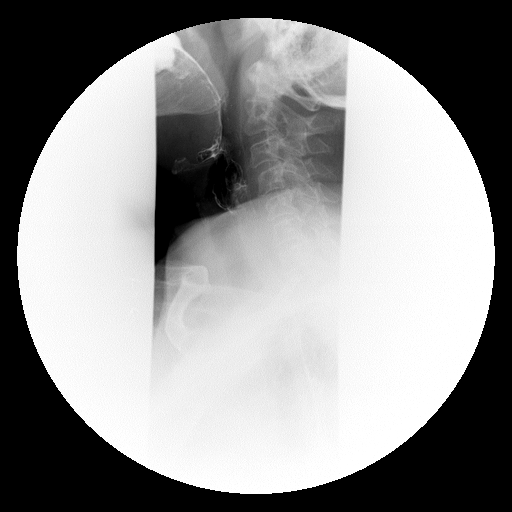
[im 3/9]
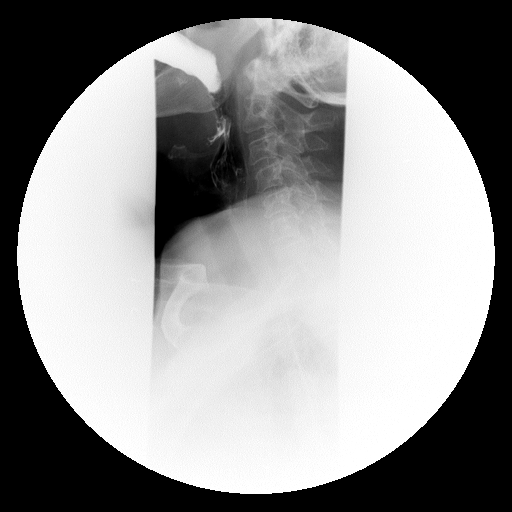
[im 7/9]
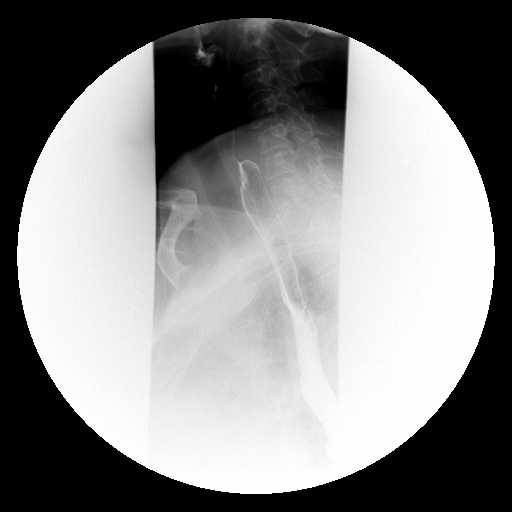
[im 9/9]
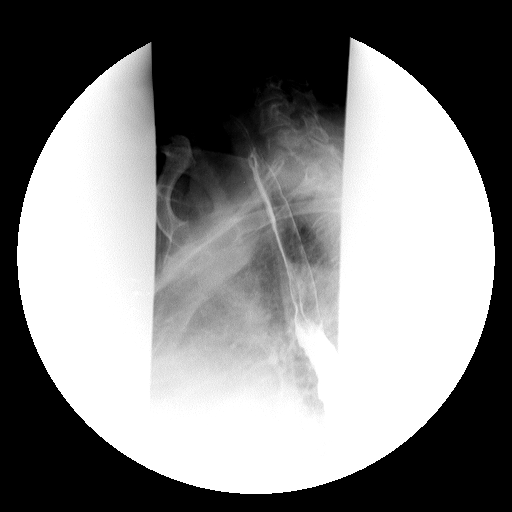

[Series 10: run · 1 of 1 slices shown (7 of 14)]
[im 1/1]
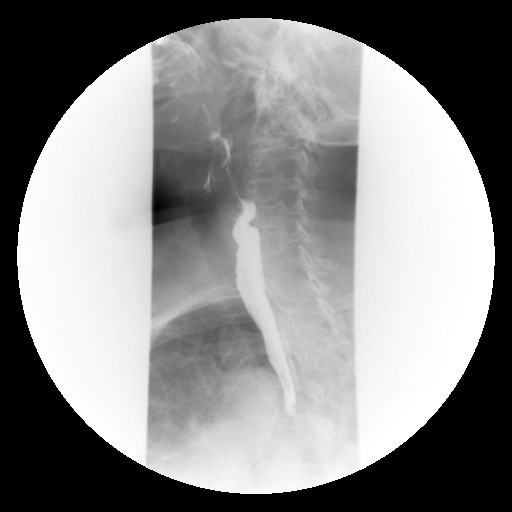

[Series 11: run · 1 of 1 slices shown (8 of 14)]
[im 1/1]
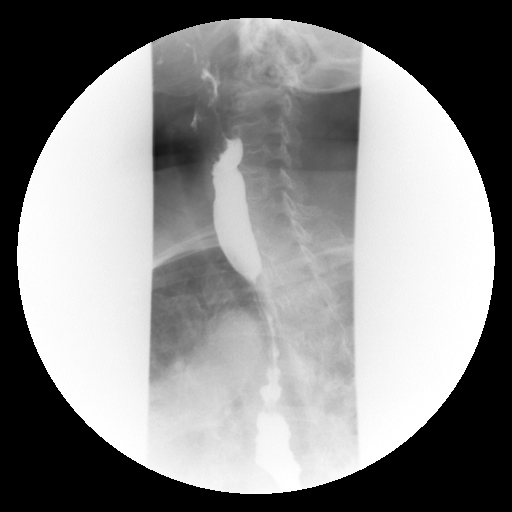

[Series 13: run · 1 of 1 slices shown (9 of 14)]
[im 1/1]
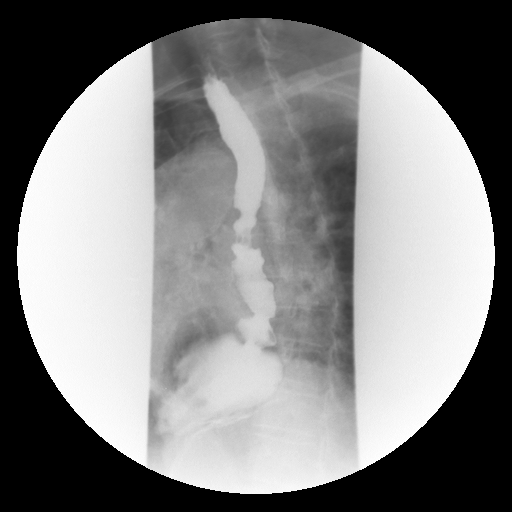

[Series 14: run · 1 of 1 slices shown (10 of 14)]
[im 1/1]
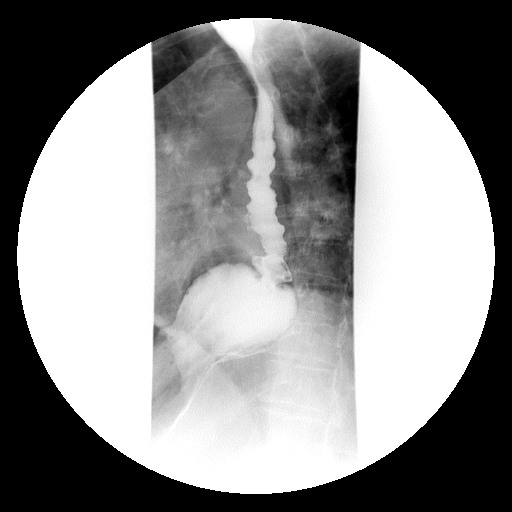

[Series 15: run · 1 of 1 slices shown (11 of 14)]
[im 1/1]
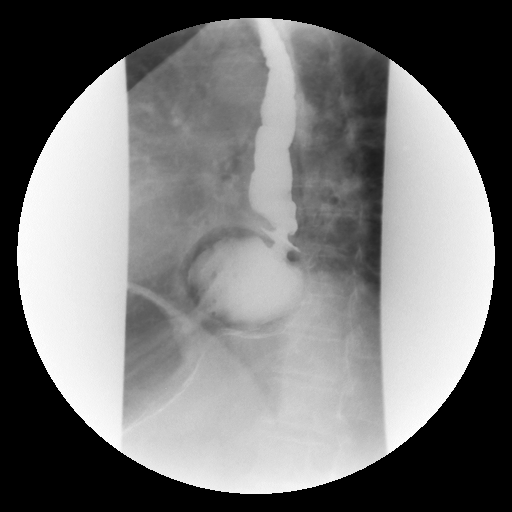

[Series 16: run · 1 of 1 slices shown (12 of 14)]
[im 1/1]
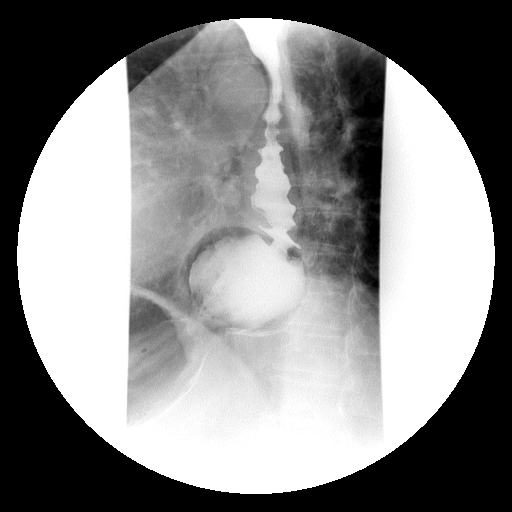

[Series 18: run · 1 of 1 slices shown (13 of 14)]
[im 1/1]
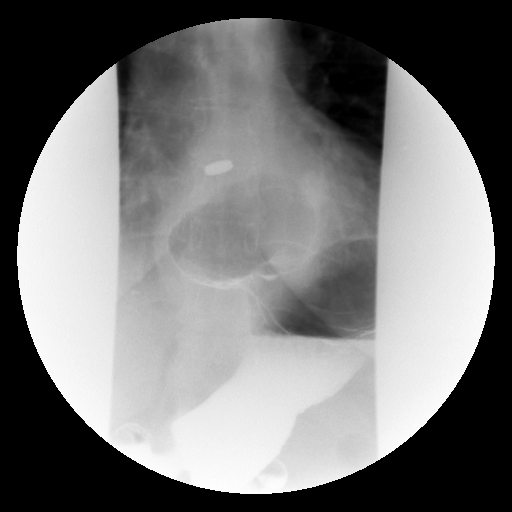

[Series 19: run · 1 of 1 slices shown (14 of 14)]
[im 1/1]
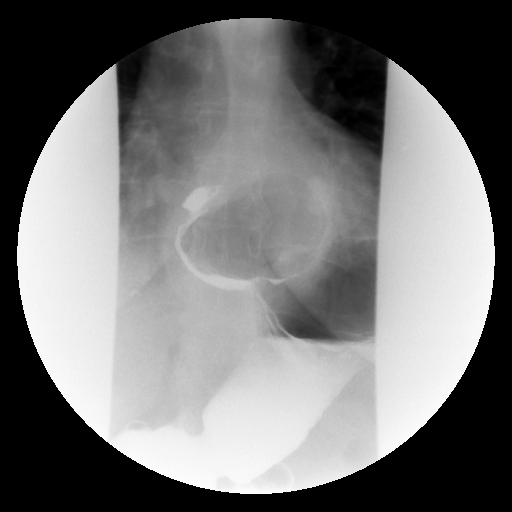

[19 of 24 positions shown; findings below may reference images not displayed]

FINDINGS: A double-contrast barium swallow was performed. The mucosa of the
esophagus is unremarkable. There are rather significant tertiary
contractions within the mid and distal esophagus consistent with
presbyesophagus. Rapid sequence spot films of the cervical esophagus
show the swallowing mechanism to be unremarkable. There is some
prominence of the cricopharyngeus muscle noted. Again moderate
tertiary contractions are noted within the mid and distal esophagus.
A moderate-sized hiatal hernia is noted. No definite
gastroesophageal reflux could be demonstrated with the water siphon
maneuver. A barium pill was given at the end of the study which
lodged just above the hiatal hernia and did not pass into the
stomach after additional water and barium were given. This indicates
a short segment distal esophageal stricture.
IMPRESSION: 1. Small to moderate size hiatal hernia. No definite
gastroesophageal reflux could be demonstrated.
2. The barium pill lodges just above the hiatal hernia consistent
with a short segment distal esophageal stricture.
3. Significant tertiary contractions in the mid and distal
esophagus.
4. Slight prominence of the cricopharyngeus muscle.

## 2015-12-15 ENCOUNTER — Other Ambulatory Visit: Payer: Self-pay | Admitting: Gastroenterology

## 2015-12-21 ENCOUNTER — Encounter (HOSPITAL_COMMUNITY): Payer: Self-pay | Admitting: *Deleted

## 2015-12-27 ENCOUNTER — Ambulatory Visit (HOSPITAL_COMMUNITY): Admission: RE | Admit: 2015-12-27 | Payer: Medicare Other | Source: Ambulatory Visit | Admitting: Gastroenterology

## 2015-12-27 ENCOUNTER — Encounter (HOSPITAL_COMMUNITY): Payer: Self-pay | Admitting: Anesthesiology

## 2015-12-27 HISTORY — DX: Other allergic rhinitis: J30.89

## 2015-12-27 SURGERY — ESOPHAGOGASTRODUODENOSCOPY (EGD) WITH PROPOFOL
Anesthesia: Monitor Anesthesia Care

## 2015-12-27 MED ORDER — PROPOFOL 10 MG/ML IV BOLUS
INTRAVENOUS | Status: AC
  Filled 2015-12-27: qty 40

## 2015-12-27 MED ORDER — LIDOCAINE HCL (CARDIAC) 20 MG/ML IV SOLN
INTRAVENOUS | Status: AC
  Filled 2015-12-27: qty 5

## 2015-12-27 NOTE — Anesthesia Preprocedure Evaluation (Deleted)
Anesthesia Evaluation  Patient identified by MRN, date of birth, ID band Patient awake    Reviewed: Allergy & Precautions, NPO status , Patient's Chart, lab work & pertinent test results  History of Anesthesia Complications (+) Family history of anesthesia reaction  Airway Mallampati: II  TM Distance: >3 FB Neck ROM: Full    Dental no notable dental hx.    Pulmonary pneumonia, resolved,  CXR: small to moderate hiatal hernia.   Pulmonary exam normal breath sounds clear to auscultation       Cardiovascular negative cardio ROS Normal cardiovascular exam Rhythm:Regular Rate:Normal     Neuro/Psych negative neurological ROS  negative psych ROS   GI/Hepatic Neg liver ROS, GERD  ,  Endo/Other  Hypothyroidism   Renal/GU negative Renal ROS  negative genitourinary   Musculoskeletal  (+) Arthritis ,   Abdominal   Peds negative pediatric ROS (+)  Hematology negative hematology ROS (+)   Anesthesia Other Findings   Reproductive/Obstetrics negative OB ROS                            Anesthesia Physical Anesthesia Plan  ASA: II  Anesthesia Plan: MAC   Post-op Pain Management:    Induction: Intravenous  Airway Management Planned: Natural Airway  Additional Equipment:   Intra-op Plan:   Post-operative Plan:   Informed Consent: I have reviewed the patients History and Physical, chart, labs and discussed the procedure including the risks, benefits and alternatives for the proposed anesthesia with the patient or authorized representative who has indicated his/her understanding and acceptance.   Dental advisory given  Plan Discussed with: CRNA  Anesthesia Plan Comments: (Patient ill. Procedure cancelled.)       Anesthesia Quick Evaluation

## 2016-06-19 DIAGNOSIS — M8588 Other specified disorders of bone density and structure, other site: Secondary | ICD-10-CM | POA: Diagnosis not present

## 2016-07-03 DIAGNOSIS — M8588 Other specified disorders of bone density and structure, other site: Secondary | ICD-10-CM | POA: Diagnosis not present

## 2016-07-03 DIAGNOSIS — Z23 Encounter for immunization: Secondary | ICD-10-CM | POA: Diagnosis not present

## 2016-08-14 DIAGNOSIS — Z0000 Encounter for general adult medical examination without abnormal findings: Secondary | ICD-10-CM | POA: Diagnosis not present

## 2016-09-04 DIAGNOSIS — M25562 Pain in left knee: Secondary | ICD-10-CM | POA: Diagnosis not present

## 2016-09-04 DIAGNOSIS — M150 Primary generalized (osteo)arthritis: Secondary | ICD-10-CM | POA: Diagnosis not present

## 2016-09-04 DIAGNOSIS — M8580 Other specified disorders of bone density and structure, unspecified site: Secondary | ICD-10-CM | POA: Diagnosis not present

## 2016-09-05 DIAGNOSIS — H2513 Age-related nuclear cataract, bilateral: Secondary | ICD-10-CM | POA: Diagnosis not present

## 2016-09-05 DIAGNOSIS — H43393 Other vitreous opacities, bilateral: Secondary | ICD-10-CM | POA: Diagnosis not present

## 2016-09-12 DIAGNOSIS — R21 Rash and other nonspecific skin eruption: Secondary | ICD-10-CM | POA: Diagnosis not present

## 2016-09-12 DIAGNOSIS — M8580 Other specified disorders of bone density and structure, unspecified site: Secondary | ICD-10-CM | POA: Diagnosis not present

## 2016-09-12 DIAGNOSIS — E7800 Pure hypercholesterolemia, unspecified: Secondary | ICD-10-CM | POA: Diagnosis not present

## 2016-09-12 DIAGNOSIS — E559 Vitamin D deficiency, unspecified: Secondary | ICD-10-CM | POA: Diagnosis not present

## 2016-10-05 ENCOUNTER — Telehealth: Payer: Self-pay

## 2016-10-05 NOTE — Telephone Encounter (Signed)
Agree, don't see one in chart. Can we find out where she had it done?

## 2016-10-05 NOTE — Telephone Encounter (Signed)
I do not see where we have ordered mammogram. Have youi ordered anything for her? She said she was flagged on my chart that she has results in. I do not see why we would be contacting her via mychart. Review and let me know.

## 2016-10-05 NOTE — Telephone Encounter (Signed)
Pt called wanting to know the results from her mammogram

## 2017-01-03 DIAGNOSIS — D1030 Benign neoplasm of unspecified part of mouth: Secondary | ICD-10-CM | POA: Diagnosis not present

## 2017-01-16 ENCOUNTER — Other Ambulatory Visit: Payer: Self-pay | Admitting: Obstetrics & Gynecology

## 2017-01-16 DIAGNOSIS — Z124 Encounter for screening for malignant neoplasm of cervix: Secondary | ICD-10-CM | POA: Diagnosis not present

## 2017-01-16 DIAGNOSIS — Z1231 Encounter for screening mammogram for malignant neoplasm of breast: Secondary | ICD-10-CM | POA: Diagnosis not present

## 2017-01-17 LAB — CYTOLOGY - PAP

## 2017-01-22 DIAGNOSIS — E7800 Pure hypercholesterolemia, unspecified: Secondary | ICD-10-CM | POA: Diagnosis not present

## 2017-01-22 DIAGNOSIS — E559 Vitamin D deficiency, unspecified: Secondary | ICD-10-CM | POA: Diagnosis not present

## 2017-01-22 DIAGNOSIS — R7309 Other abnormal glucose: Secondary | ICD-10-CM | POA: Diagnosis not present

## 2017-01-22 DIAGNOSIS — N3946 Mixed incontinence: Secondary | ICD-10-CM | POA: Diagnosis not present

## 2017-01-22 DIAGNOSIS — R938 Abnormal findings on diagnostic imaging of other specified body structures: Secondary | ICD-10-CM | POA: Diagnosis not present

## 2017-02-05 DIAGNOSIS — R7301 Impaired fasting glucose: Secondary | ICD-10-CM | POA: Diagnosis not present

## 2017-02-05 DIAGNOSIS — E7800 Pure hypercholesterolemia, unspecified: Secondary | ICD-10-CM | POA: Diagnosis not present

## 2017-02-06 DIAGNOSIS — N8500 Endometrial hyperplasia, unspecified: Secondary | ICD-10-CM | POA: Diagnosis not present

## 2017-02-19 DIAGNOSIS — R938 Abnormal findings on diagnostic imaging of other specified body structures: Secondary | ICD-10-CM | POA: Diagnosis not present

## 2017-02-19 DIAGNOSIS — R7309 Other abnormal glucose: Secondary | ICD-10-CM | POA: Diagnosis not present

## 2017-02-19 DIAGNOSIS — E7800 Pure hypercholesterolemia, unspecified: Secondary | ICD-10-CM | POA: Diagnosis not present

## 2017-02-22 ENCOUNTER — Ambulatory Visit: Payer: Medicare Other | Admitting: Internal Medicine

## 2017-02-23 ENCOUNTER — Ambulatory Visit: Payer: Medicare Other | Admitting: Registered"

## 2017-02-26 ENCOUNTER — Encounter: Payer: Self-pay | Admitting: Registered"

## 2017-02-26 ENCOUNTER — Encounter: Payer: Medicare Other | Attending: Internal Medicine | Admitting: Registered"

## 2017-02-26 DIAGNOSIS — E7800 Pure hypercholesterolemia, unspecified: Secondary | ICD-10-CM

## 2017-02-26 DIAGNOSIS — Z713 Dietary counseling and surveillance: Secondary | ICD-10-CM | POA: Insufficient documentation

## 2017-02-26 NOTE — Progress Notes (Signed)
Medical Nutrition Therapy:  Appt start time: 1245 end time:  1125.   Assessment:  Primary concerns today: Pt referred for hypercholesterolemia. Pt says her MD was concerned about pt's high LDL cholesterol levels. Pt says she thought about starting the keto diet, but her MD advised her against it as it is high in fat and recommended she see a nutritionist instead. Pt says she would rather eat clean/healthy and avoid taking statins. Pt says she has gained weight since being dx with hypothyroidism.   Preferred Learning Style:   No preference indicated   Learning Readiness:  Ready  MEDICATIONS: See list.   DIETARY INTAKE:  Usual eating pattern includes 2 meals and 1-2  snacks per day.  Pt says she really likes to drink Coke and has had a hard time cutting it out of her diet. Pt says she often skips breakfast/goes an extended period of time without eating and will then crave "unhealthy" foods and carbohydrates later.  Everyday foods include chicken, cheese.  Avoided foods include liver, cottage cheese, cow's milk.    24-hr recall:  B ( AM):  None reported Snk ( AM): None reported  L ( PM): Margarita bowl from [employer] Snk ( PM): None reported  D ( PM): Teriyaki sub sandwich  Snk ( PM): None reported.  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  Progress Towards Goal(s):  In progress.   Nutritional Diagnosis:  NI-5.11.1 Predicted suboptimal nutrient intake As related to skipping meals.  As evidenced by pt's diet recall.    Intervention:  Nutrition Counseling provided. Dietitian discussed pt's cholesterol levels and most recent A1C with pt. Dietitian counseled pt regarding balanced eating and the importance of eating regular meals/not skipping meals. Dietitian discussed with pt how skipping meals slows metabolism and can lead to weight gain, as well as tiredness and becoming overly hungry later in the day  which can lead to overeating. Dietitian discussed with pt foods most beneficial to improving cholesterol levels (healthy fats, fruits, vegetables, and whole grains) as well as foods that can increase LDL cholesterol levels (those high in saturated and trans fats). Dietitian also discussed benefits of physical activity on improving HDL levels and promoting healthy blood glucose maintenance. Dietitian encouraged pt to drink more water and less soda. Dietitian discussed nutrition label reading with pt and provided a resource to assess nutritional content of foods from restaurants.   Goals:   Try to get in three meals per day to promote healthy metabolism, provide adequate energy, and prevent becoming overly hungry.   Try to consume more heart healthy fats (see handout) and less saturated and trans fat (see handouts).   Try to get in more fruits, vegetables, and whole grains. Soluble fiber found in these foods can help lower LDL cholesterol.  Try to get in physical activity as able as it can help improve HDL levels.   Calorieking.com for looking up nutritional value of meals from restaurants.   Try to get in more water to ensure proper hydration. Try to drink more water in place of soda.   Teaching Method Utilized:  Auditory  Handouts given during visit include:  Cholesterol and Triglycerides Handout   Heart Healthy Nutrition Handout  Types of Fat Handout   Breakfast Made Easy  Balanced Plate  Arm Exercise Examples handout   Cardiac TLC Handout (for meal plan example)  Heart Healthy Nutrition Therapy (for meal plan example)  Barriers to learning/adherence to lifestyle change: None indicated.   Demonstrated degree of understanding via:  Teach Back   Monitoring/Evaluation:  Dietary intake, exercise, and body weight prn.

## 2017-02-26 NOTE — Patient Instructions (Addendum)
Try to get in three meals per day to promote healthy metabolism, provide adequate energy, and prevent becoming overly hungry.   Try to consume more heart healthy fats (see handout) and less saturated and trans fat (see handouts).   Try to get in more fruits, vegetables, and whole grains. Soluble fiber found in these foods can help lower LDL cholesterol.  Try to get in physical activity as able as it can help improve HDL levels.   Calorieking.com for looking up nutritional value of meals from restaurants.   Try to get in more water to ensure proper hydration. Try to drink more water in place of soda.

## 2017-02-28 ENCOUNTER — Ambulatory Visit: Payer: Medicare Other | Admitting: Cardiology

## 2017-03-07 ENCOUNTER — Ambulatory Visit (INDEPENDENT_AMBULATORY_CARE_PROVIDER_SITE_OTHER): Payer: Medicare Other | Admitting: Cardiovascular Disease

## 2017-03-07 ENCOUNTER — Encounter: Payer: Self-pay | Admitting: Cardiovascular Disease

## 2017-03-07 VITALS — BP 180/97 | HR 80 | Ht 64.0 in | Wt 210.6 lb

## 2017-03-07 DIAGNOSIS — Z8249 Family history of ischemic heart disease and other diseases of the circulatory system: Secondary | ICD-10-CM | POA: Diagnosis not present

## 2017-03-07 DIAGNOSIS — E7800 Pure hypercholesterolemia, unspecified: Secondary | ICD-10-CM | POA: Diagnosis not present

## 2017-03-07 NOTE — Progress Notes (Signed)
Cardiology Office Note   Date:  03/08/2017   ID:  Katie Weber, DOB 1945/11/22, MRN [id number]  PCP:  Lanice Shirts, MD  Cardiologist:   Skeet Latch, MD   Chief Complaint  Patient presents with  . New Patient (Initial Visit)  . Shortness of Breath    occasionally.      History of Present Illness: Katie Weber is a 71 y.o. female who is being seen today for the evaluation of hyperlipidemia at the request of Schoenhoff, Altamese Cabal, *.  Katie Weber saw Dr. Rudene Anda on 02/19/17 and was noted to have hyperlipidemia with an ASCVD 10 year risk of 10.2%.  However she was hesitant to start a statin due to concerns for possible side effects. She notes that it is difficult for her to remember to take her medication and only recently started taking levothyroxine as prescribed. She notes that her diet is poor and that she does not exercise regularly.  She is active, caring for her three grandchildren, but does not formally exercise.  Her exercise is also limited by her knee.  She followed up with a nutritionist on 5/21 and is hopeful that she can implement dietary changes.  Katie Weber moved from [place], [place] three years ago.  She denies chest pain, shortness of breath, lower extremity edema, orthopnea or PND.  She also denies lightheadedness or palpitations.  Past Medical History:  Diagnosis Date  . Arthritis    arthritis fingers  . Diverticulitis    x2 -last 1'17 tx. outpatient.  . Environmental and seasonal allergies    2014 Possibly related to air freshners.  . 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  . Family history of anesthesia complication 62HUT ago   allergic reaction to atropine  . GERD (gastroesophageal reflux disease)    no meds  . History of kidney stones    '80's lithotripsy-UVA x1  . History of kidney stones    x1  . Hyperlipidemia   .  Hypothyroidism   . Pneumonia 2014  . Pure hypercholesterolemia     Past Surgical History:  Procedure Laterality Date  . BALLOON DILATION N/A 05/04/2014   Procedure: BALLOON DILATION;  Surgeon: Garlan Fair, MD;  Location: [facility];  Service: Endoscopy;  Laterality: N/A;  . BREAST SURGERY Right    '72 -benign tumor  . childbirth- NVD x2     . COLONOSCOPY WITH PROPOFOL N/A 05/04/2014   Procedure: COLONOSCOPY WITH PROPOFOL;  Surgeon: Garlan Fair, MD;  Location: [facility];  Service: Endoscopy;  Laterality: N/A;  . DILATION AND CURETTAGE OF UTERUS     04-20-14 [facility] hospital endometrial polyp removed.  . ESOPHAGOGASTRODUODENOSCOPY (EGD) WITH PROPOFOL N/A 05/04/2014   Procedure: ESOPHAGOGASTRODUODENOSCOPY (EGD) WITH PROPOFOL;  Surgeon: Garlan Fair, MD;  Location: [facility];  Service: Endoscopy;  Laterality: N/A;. Procedure done in [place], [place]- 5 yrs ago.  Marland Kitchen HYSTEROSCOPY W/D&C N/A 04/20/2014   Procedure: DILATATION AND CURETTAGE /HYSTEROSCOPY;  Surgeon: [name], MD;  Location: [facility];  Service: Gynecology;  Laterality: N/A;  . LITHOTRIPSY    . TUBAL LIGATION       Current Outpatient Prescriptions  Medication Sig Dispense Refill  . levothyroxine (SYNTHROID, LEVOTHROID) 100 MCG tablet Take 100 mcg by mouth daily before breakfast.     No current facility-administered medications for this visit.     Allergies:   Contrast media [iodinated diagnostic agents]  and Penicillins    Social History:  The patient  reports that she has never smoked. She has never used smokeless tobacco. She reports that she drinks alcohol. She reports that she does not use drugs.   Family History:  The patient's family history includes CAD in her father; Cancer in her other; Endometrial cancer in her mother.    ROS:  Please see the history of present illness.   Otherwise, review of systems are positive for none.   All other systems are reviewed and negative.    PHYSICAL EXAM: VS:   BP (!) 180/97   Pulse 80   Ht 5\' 4"  (1.626 m)   Wt 95.5 kg (210 lb 9.6 oz)   BMI 36.15 kg/m  , BMI Body mass index is 36.15 kg/m. GENERAL:  Well appearing HEENT:  Pupils equal round and reactive, fundi not visualized, oral mucosa unremarkable NECK:  No jugular venous distention, waveform within normal limits, carotid upstroke brisk and symmetric, no bruits, no thyromegaly LYMPHATICS:  No cervical adenopathy LUNGS:  Clear to auscultation bilaterally HEART:  RRR.  PMI not displaced or sustained,S1 and S2 within normal limits, no S3, no S4, no clicks, no rubs, no murmurs ABD:  Flat, positive bowel sounds normal in frequency in pitch, no bruits, no rebound, no guarding, no midline pulsatile mass, no hepatomegaly, no splenomegaly EXT:  2 plus pulses throughout, no edema, no cyanosis no clubbing SKIN:  No rashes no nodules NEURO:  Cranial nerves II through XII grossly intact, motor grossly intact throughout PSYCH:  Cognitively intact, oriented to person place and time    EKG:  EKG is ordered today. The ekg ordered today demonstrates sinus rhythm rate 80 bpm    Recent Labs: No results found for requested labs within last 8760 hours.   02/05/17: hemoglobin A1c 5.9% Sodium 140, potassium 4.0, BUN 13, creatinine 0.98 Total cholesterol 253, triglycerides 133, HDL 46, LDL 181   Lipid Panel No results found for: CHOL, TRIG, HDL, CHOLHDL, VLDL, LDLCALC, LDLDIRECT    Wt Readings from Last 3 Encounters:  03/07/17 95.5 kg (210 lb 9.6 oz)  10/16/15 90.7 kg (200 lb)  09/27/14 97.1 kg (214 lb 1.6 oz)      ASSESSMENT AND PLAN:  # Hyperlipidemia: Katie Weber's  ASCVD 10 year risk was 10.2% when she saw her PCP.  Given her much higher BP today, it would be even higher.  She noted that she is stressed due to trying to find our office, and that her BP has never been elevated before.  She is very hesitant to start statins and we spent over 30 minutes discussing the safety and benefits of  statins.  There is not a substantial family history of CAD.  We will get a coronary calcium score to help risk stratify.  She will start aspirin 81mg  daily.  If her calcium score is high she will start a statin.     Current medicines are reviewed at length with the patient today.  The patient does not have concerns regarding medicines.  The following changes have been made:  no change  Labs/ tests ordered today include:   Orders Placed This Encounter  Procedures  . CT CARDIAC SCORING  . EKG 12-Lead    Time spent: 40 minutes-Greater than 50% of this time was spent in counseling, explanation of diagnosis, planning of further management, and coordination of care.   Disposition:   FU with Munira Polson C. Oval Linsey, MD, [facility] in 6 weeks.  This note was written with the assistance of speech recognition software.  Please excuse any transcriptional errors.  Signed, Marselino Slayton C. Oval Linsey, MD, [facility]  03/08/2017 10:06 PM    [place]

## 2017-03-07 NOTE — Patient Instructions (Signed)
Medication Instructions:  Your physician recommends that you continue on your current medications as directed. Please refer to the Current Medication list given to you today.  Labwork: NONE  Testing/Procedures: CORONARY CALCIUM SCORE AT [address]  Follow-Up: Your physician recommends that you schedule a follow-up appointment in: 6 WEEKS   Any Other Special Instructions Will Be Listed Below (If Applicable). MONITOR TO YOUR BLOOD PRESSURE AND KEEP LOG, BRING WITH YOU TO FOLLOW UP   If you need a refill on your cardiac medications before your next appointment, please call your pharmacy.

## 2017-03-08 ENCOUNTER — Encounter: Payer: Self-pay | Admitting: Cardiovascular Disease

## 2017-03-19 ENCOUNTER — Inpatient Hospital Stay: Admission: RE | Admit: 2017-03-19 | Payer: Medicare Other | Source: Ambulatory Visit

## 2017-04-02 ENCOUNTER — Ambulatory Visit (INDEPENDENT_AMBULATORY_CARE_PROVIDER_SITE_OTHER)
Admission: RE | Admit: 2017-04-02 | Discharge: 2017-04-02 | Disposition: A | Discharge status: Home / Self Care | Payer: Self-pay | Source: Ambulatory Visit | Attending: Cardiovascular Disease | Admitting: Cardiovascular Disease

## 2017-04-02 DIAGNOSIS — Z8249 Family history of ischemic heart disease and other diseases of the circulatory system: Secondary | ICD-10-CM

## 2017-04-02 IMAGING — CT CT HEART SCORING
2 series · 16 of 20 positions shown, 18 images · non-contrast
Comparison: Visualized lung bases on prior CT abdomen and pelvis
[DATE], [DATE]. No prior chest CT.

CLINICAL DATA: Risk stratification

EXAM:
Coronary Calcium Score
TECHNIQUE: The patient was scanned on a Siemens Somatom 64 slice scanner. Axial
non-contrast 3 mm slices were carried out through the heart. The
data set was analyzed on a dedicated work station and scored using
the Agatson method.

[Series 2: casc 3.0 i36f 2 bestdiast 66 % · axial · 0.39mm/px · z∈[-211,-127]mm · 8 of 37 slices shown, 10 images]
[im 5/37  vessel]
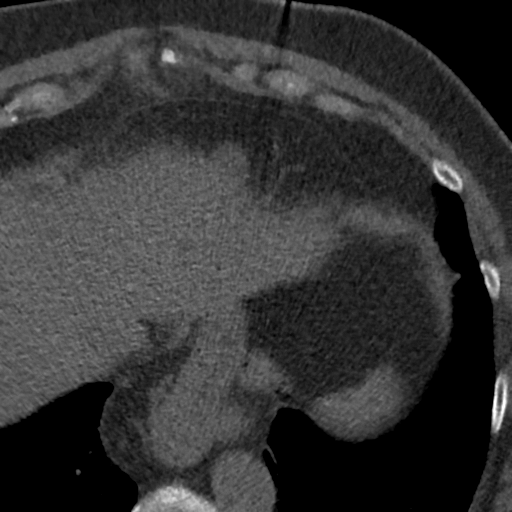
[im 5/37  lung]
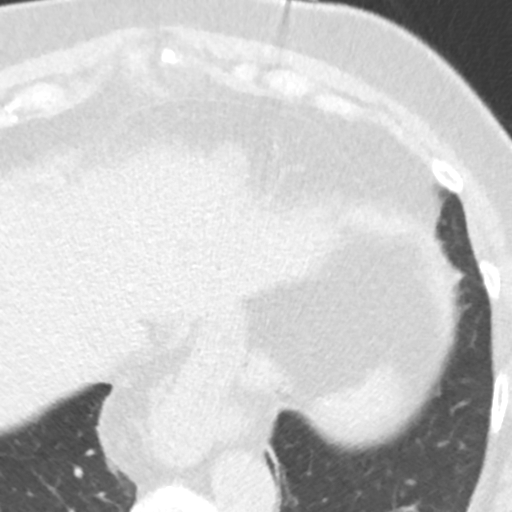
[im 9/37  vessel]
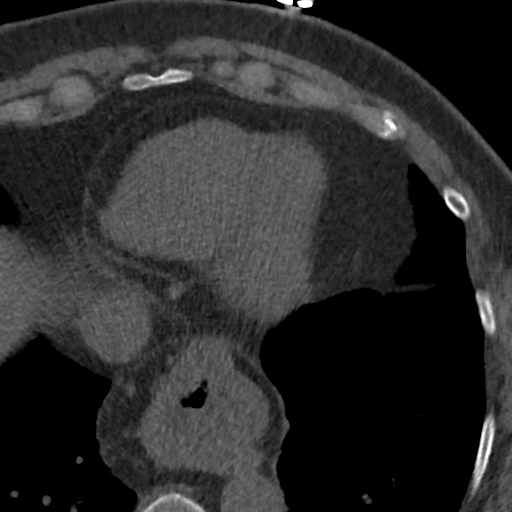
[im 13/37  vessel]
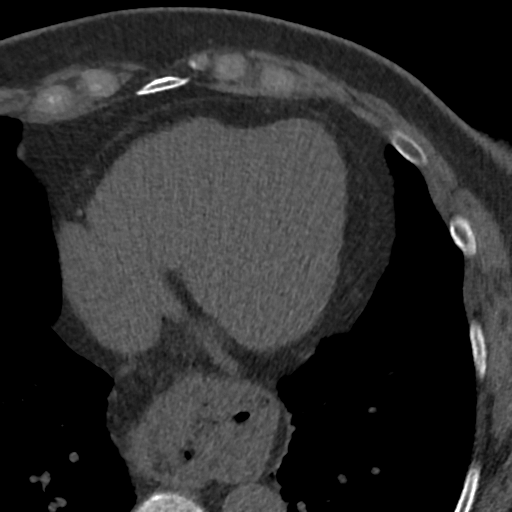
[im 17/37  vessel]
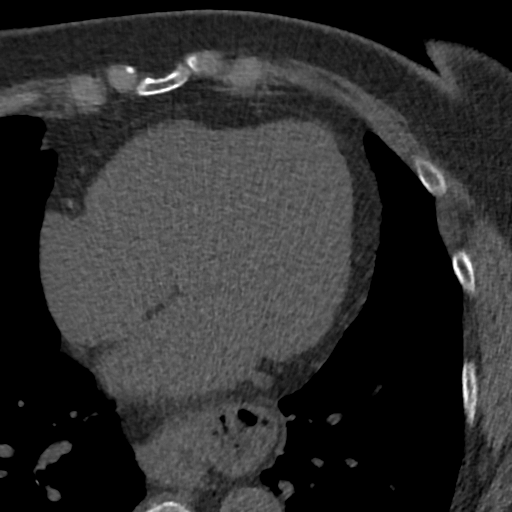
[im 21/37  vessel]
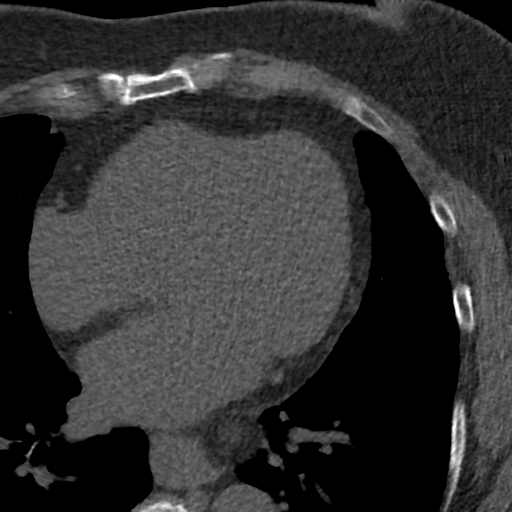
[im 21/37  lung]
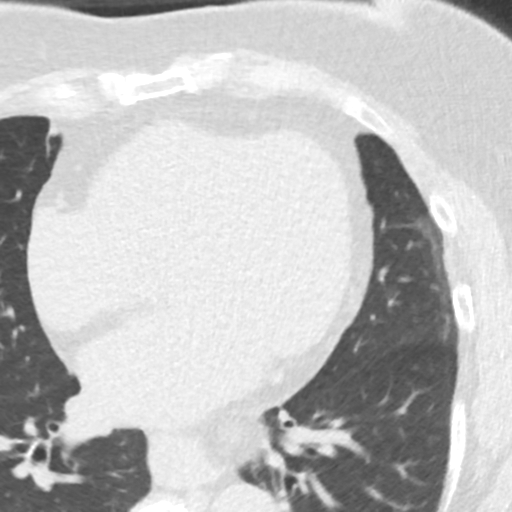
[im 25/37  vessel]
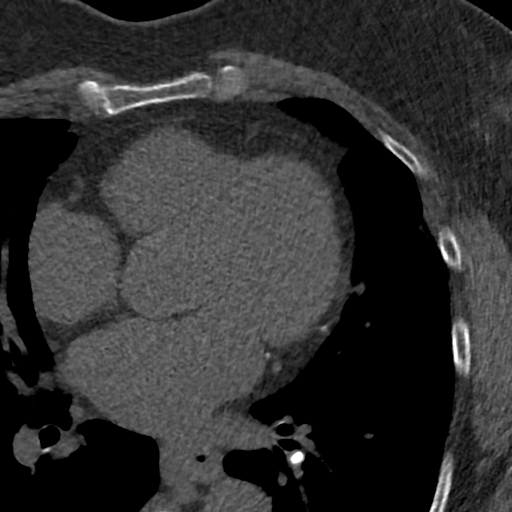
[im 29/37  vessel]
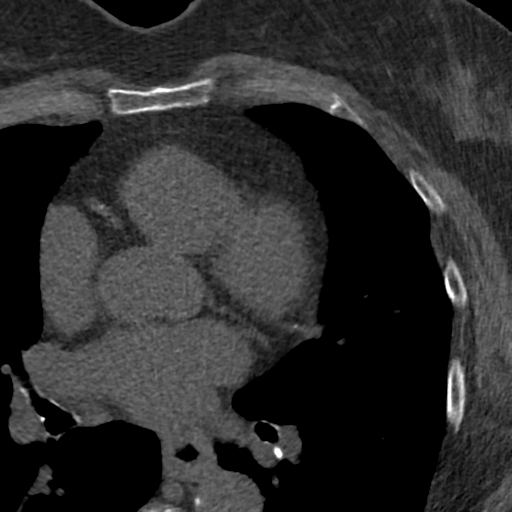
[im 33/37  vessel]
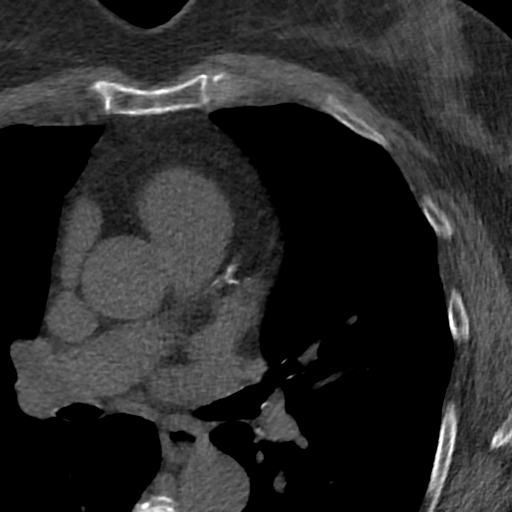

[Series 4: lung st 65 % · axial · 0.63mm/px · z∈[-211,-127]mm · 8 of 37 slices shown]
[im 5/37  lung]
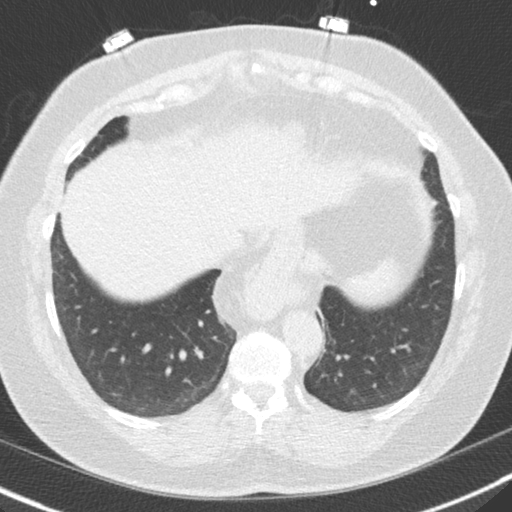
[im 9/37  lung]
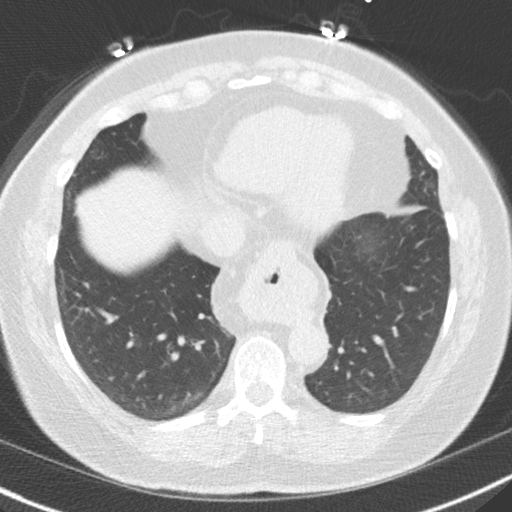
[im 13/37  lung]
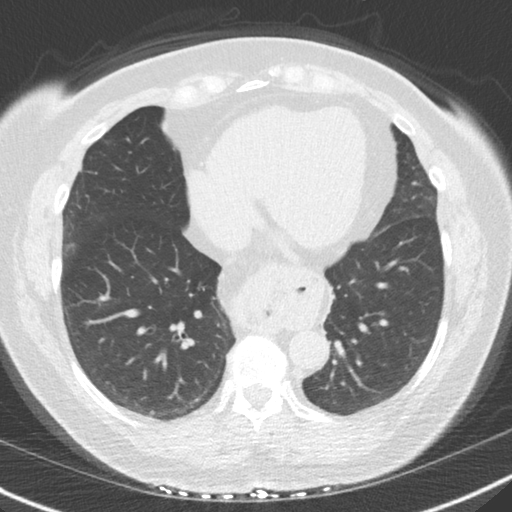
[im 17/37  lung]
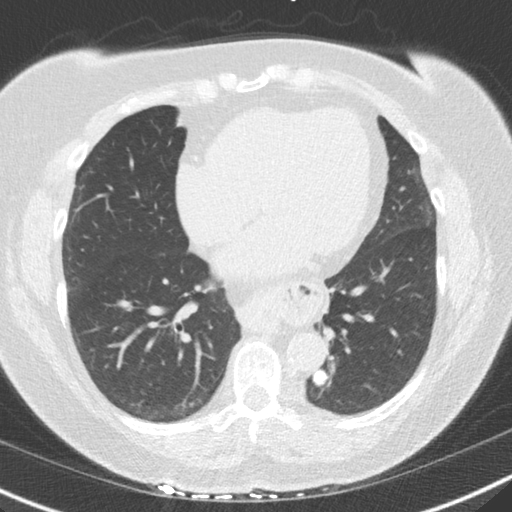
[im 21/37  lung]
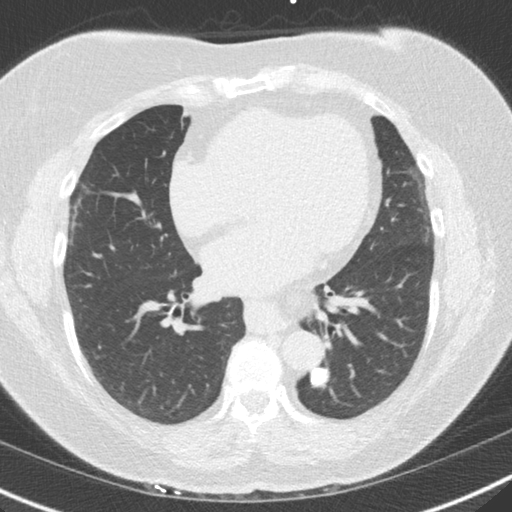
[im 25/37  lung]
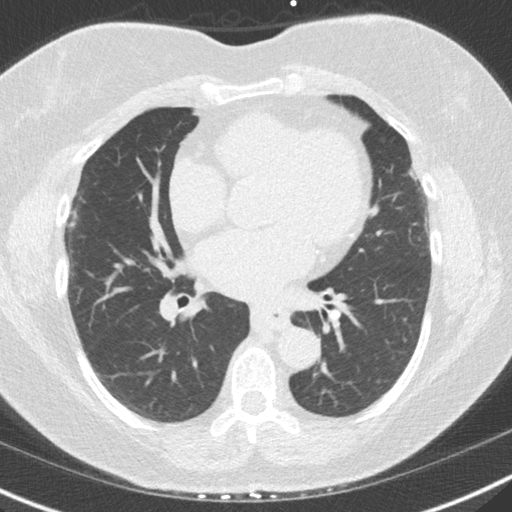
[im 29/37  lung]
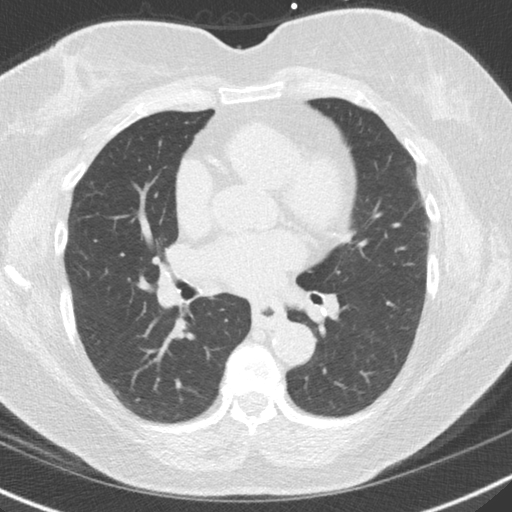
[im 33/37  lung]
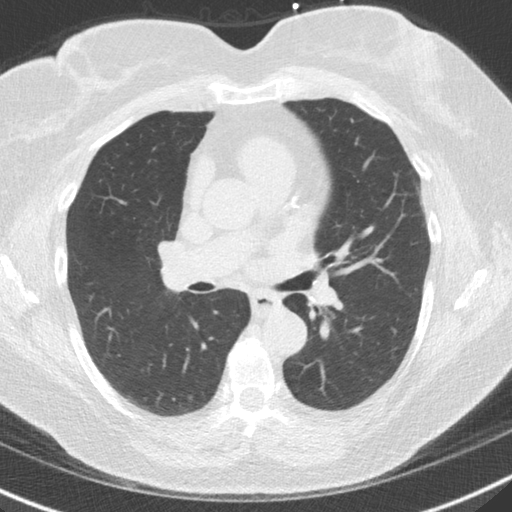

[16 of 20 positions shown; findings below may reference images not displayed]

FINDINGS: Non-cardiac: See separate report from [REDACTED].

Ascending Aorta:  3.3 cm

Pericardium: Normal

Coronary arteries: LM and 3 vessel coronary calcium detected. More
diffuse in proximal and mid LAD Punctate in RCA and circumflex
IMPRESSION: Coronary calcium score of 207 . This was 82nd percentile for age and
sex matched control.

EISHIRO

EXAM:
OVER-READ INTERPRETATION  CT CHEST

The following report is an over-read performed by radiologist Dr.
over-read does not include interpretation of cardiac or coronary
anatomy or pathology. The coronary calcium score interpretation by
the cardiologist is attached.
FINDINGS: Cardiovascular: Mild atherosclerosis involving the descending
thoracic aorta. Minimal aortic annular calcification.

Mediastinum/Nodes: Moderate-sized hiatal hernia. No pathologic
lymphadenopathy within the visualized thorax.

Lungs/Pleura: Minimal subpleural scarring involving the lateral
aspect of both lower lobes, unchanged. Densely calcified granuloma
involving the medial left lower lobe, unchanged. No new
abnormalities within the visualized lungs.

Upper Abdomen: Apart from the hiatal hernia, visualized extreme
upper abdomen unremarkable.

Musculoskeletal: Compression fracture involving the lower endplate
of what I believe is the T8 vertebral body on the order of 30-40%, a
new finding since the [DATE] CT, though likely not acute. No
associated retropulsion.
IMPRESSION: 1. Moderate-sized hiatal hernia.
2. Calcified granuloma involving the medial left lower lobe. Minimal
subpleural scarring involving the lateral aspect of both lower
lobes. No acute abnormalities involving the visualized lungs.
3. Mild compression fracture involving the lower endplate of what I
believe is the T8 vertebral body, new since [DATE], though
likely not acute.

## 2017-04-03 ENCOUNTER — Telehealth: Payer: Self-pay | Admitting: *Deleted

## 2017-04-03 DIAGNOSIS — N8500 Endometrial hyperplasia, unspecified: Secondary | ICD-10-CM | POA: Diagnosis not present

## 2017-04-03 NOTE — Telephone Encounter (Signed)
-----   Message from Skeet Latch, MD sent at 04/03/2017 11:50 AM EDT ----- CT scan showed that she has a high coronary calcium score and there is evidence of some plaque in her coronary arteries.  We cannot tell how much with this test.  Therefore, I think she should really consider starting a statin.  I would recommend starting rosuvastatin 20 mg daily.  We can discuss more at follow up if she wants to wait.  CT also showed a moderate sized hiatal hernia and a compression fracture of the thoracic spine.

## 2017-04-03 NOTE — Telephone Encounter (Signed)
Left message to call back  

## 2017-04-06 NOTE — Telephone Encounter (Signed)
Advised patient of results.  

## 2017-04-23 NOTE — Progress Notes (Signed)
Cardiology Office Note   Date:  04/24/2017   ID:  Katie Weber, DOB 1946-04-07, MRN [id number]  PCP:  Katie Shirts, MD  Cardiologist:   Katie Latch, MD   No chief complaint on file.     History of Present Illness: Katie Weber is a 71 y.o. female with asymptomatic coronary calcification and hyperlipidemia here for follow up.  Katie Weber was seen 02/2017 for the evaluation of hyperlipidemia.  Katie Weber saw Dr. Rudene Weber on 02/19/17 and was noted to have hyperlipidemia with an ASCVD 10 year risk of 10.2%.  However she was hesitant to start a statin due to concerns for possible side effects. She also has a hard time remembering to take medications.  She was asymptomatic so she referred for a coronary calcium score to better risk stratify.  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  Her coronary calcium score was 207 (82nd percentile).  It also showed a mild thoracic compression fracture and subpleural scarring of both lungs.  Katie Weber has been feeling well.  She denies chest pain but does have shortness of breath walking up stairs.  She denies lower extremity edema, orthopnea, or PND. She doesn't get much exercise other than playing with her grandchildren.  She brings a log of her blood pressures showing that it has been in the 100-110s/60s-70s.  She continues to spend a lot of time with her grandchildren infrequently have forgets to take her medication. His last appointment she was started on aspirin 81 mg daily but has only been taking in approximately 3 days per week.  She laments that she has difficulty losing weight and wonders if this is related to her thyroid.  Past Medical History:  Diagnosis Date  . Arthritis    arthritis fingers  . Coronary artery calcification 04/24/2017  . Diverticulitis    x2 -last 1'17 tx. outpatient.  . Environmental and seasonal allergies    2014 Possibly related to air freshners.  .  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  . Family history of anesthesia complication 03OZY ago   allergic reaction to atropine  . GERD (gastroesophageal reflux disease)    no meds  . History of kidney stones    '80's lithotripsy-UVA x1  . History of kidney stones    x1  . Hyperlipidemia   . Hypothyroidism   . Pneumonia 2014  . Pure hypercholesterolemia     Past Surgical History:  Procedure Laterality Date  . BALLOON DILATION N/A 05/04/2014   Procedure: BALLOON DILATION;  Surgeon: Garlan Fair, MD;  Location: [facility];  Service: Endoscopy;  Laterality: N/A;  . BREAST SURGERY Right    '72 -benign tumor  . childbirth- NVD x2     . COLONOSCOPY WITH PROPOFOL N/A 05/04/2014   Procedure: COLONOSCOPY WITH PROPOFOL;  Surgeon: Garlan Fair, MD;  Location: [facility];  Service: Endoscopy;  Laterality: N/A;  . DILATION AND CURETTAGE OF UTERUS     04-20-14 [facility] hospital endometrial polyp removed.  . ESOPHAGOGASTRODUODENOSCOPY (EGD) WITH PROPOFOL N/A 05/04/2014   Procedure: ESOPHAGOGASTRODUODENOSCOPY (EGD) WITH PROPOFOL;  Surgeon: Garlan Fair, MD;  Location: [facility];  Service: Endoscopy;  Laterality: N/A;. Procedure done in [place], [place]- 5 yrs ago.  Marland Kitchen HYSTEROSCOPY W/D&C N/A 04/20/2014   Procedure: DILATATION AND CURETTAGE /HYSTEROSCOPY;  Surgeon: [name], MD;  Location: [facility];  Service: Gynecology;  Laterality: N/A;  .  LITHOTRIPSY    . TUBAL LIGATION       Current Outpatient Prescriptions  Medication Sig Dispense Refill  . aspirin EC 81 MG tablet Take 81 mg by mouth daily.    Marland Kitchen levothyroxine (SYNTHROID, LEVOTHROID) 100 MCG tablet Take 100 mcg by mouth daily before breakfast.    . rosuvastatin (CRESTOR) 20 MG tablet Take 1 tablet (20 mg total) by mouth daily. 30 tablet 5   No current facility-administered medications for this visit.     Allergies:   Contrast media [iodinated diagnostic agents]  and Penicillins    Social History:  The patient  reports that she has never smoked. She has never used smokeless tobacco. She reports that she drinks alcohol. She reports that she does not use drugs.   Family History:  The patient's family history includes CAD in her father; Cancer in her other; Endometrial cancer in her mother.    ROS:  Please see the history of present illness.   Otherwise, review of systems are positive for none.   All other systems are reviewed and negative.    PHYSICAL EXAM: VS:  BP 140/87   Pulse 75   Ht 5\' 3"  (1.6 m)   Wt 95.1 kg (209 lb 9.6 oz)   SpO2 95%   BMI 37.13 kg/m  , BMI Body mass index is 37.13 kg/m. GENERAL:  Well appearing.  No acute distress HEENT: Pupils equal round and reactive, fundi not visualized, oral mucosa unremarkable NECK:  No jugular venous distention, waveform within normal limits, carotid upstroke brisk and symmetric, no bruits, no thyromegaly LUNGS:  Clear to auscultation bilaterally.  No crackles, wheezes or rhonchi. HEART:  RRR.  PMI not displaced or sustained,S1 and S2 within normal limits, no S3, no S4, no clicks, no rubs, no murmurs ABD:  Flat, positive bowel sounds normal in frequency in pitch, no bruits, no rebound, no guarding, no midline pulsatile mass, no hepatomegaly, no splenomegaly EXT:  2 plus pulses throughout, no edema, no cyanosis no clubbing SKIN:  No rashes no nodules NEURO:  Cranial nerves II through XII grossly intact, motor grossly intact throughout PSYCH:  Cognitively intact, oriented to person place and time   EKG:  EKG is not ordered today. The ekg ordered 03/07/17 demonstrates sinus rhythm rate 80 bpm    Recent Labs: No results found for requested labs within last 8760 hours.   02/05/17: hemoglobin A1c 5.9% Sodium 140, potassium 4.0, BUN 13, creatinine 0.98 Total cholesterol 253, triglycerides 133, HDL 46, LDL 181  Coronary CT-A 04/02/17: Ascending Aorta:  3.3 cm  Pericardium: Normal  Coronary  arteries: LM and 3 vessel coronary calcium detected. More diffuse in proximal and mid LAD Punctate in RCA and circumflex  IMPRESSION: Coronary calcium score of 207 . This was 82nd percentile for age and sex matched control.   Lipid Panel No results found for: CHOL, TRIG, HDL, CHOLHDL, VLDL, LDLCALC, LDLDIRECT    Wt Readings from Last 3 Encounters:  04/24/17 95.1 kg (209 lb 9.6 oz)  03/07/17 95.5 kg (210 lb 9.6 oz)  10/16/15 90.7 kg (200 lb)      ASSESSMENT AND PLAN:  # Hyperlipidemia:  # Asymptomatic coronary calcification: Ms. Spurrier-Broten's calcium score is 82nd percentile.  We discussed the fact that this is convincing evidence that she should be taking both aspirin and a statin. She reluctantly agreed. We will start rosuvastatin 20 mg daily and aspirin 81 mg daily. Repeat lipids and CMP in 6 weeks.  Given that she is  not very physically active and had coronary calcification in the left main and L3 coronary distributions, we will check an ETT to [place] evaluate for ischemia.  We also discussed a plant-based diet and increased exercise as ways to eventually get off her statin.  # Elevated blood pressure:  BP has been well-controlled at home.  This is likely white coat hypertension.   # Hypothyroidism:  Check TSH and free T4. Continue levothyroxine.    Current medicines are reviewed at length with the patient today.  The patient does not have concerns regarding medicines.  The following changes have been made:  no change  Labs/ tests ordered today include:   Orders Placed This Encounter  Procedures  . T4, free  . TSH  . Comprehensive metabolic panel  . Lipid panel  . Exercise Tolerance Test    Time spent: 45 minutes-Greater than 50% of this time was spent in counseling, explanation of diagnosis, planning of further management, and coordination of care.   Disposition:   FU with Aladdin Kollmann C. Oval Linsey, MD, [facility] in 6 months.    This note was written with the assistance  of speech recognition software.  Please excuse any transcriptional errors.  Signed, Shariah Assad C. Oval Linsey, MD, [facility]  04/24/2017 10:32 AM    [place]

## 2017-04-24 ENCOUNTER — Ambulatory Visit (INDEPENDENT_AMBULATORY_CARE_PROVIDER_SITE_OTHER): Payer: Medicare Other | Admitting: Cardiovascular Disease

## 2017-04-24 ENCOUNTER — Encounter: Payer: Self-pay | Admitting: Cardiovascular Disease

## 2017-04-24 VITALS — BP 140/87 | HR 75 | Ht 63.0 in | Wt 209.6 lb

## 2017-04-24 DIAGNOSIS — E7800 Pure hypercholesterolemia, unspecified: Secondary | ICD-10-CM | POA: Diagnosis not present

## 2017-04-24 DIAGNOSIS — I2584 Coronary atherosclerosis due to calcified coronary lesion: Secondary | ICD-10-CM | POA: Diagnosis not present

## 2017-04-24 DIAGNOSIS — E039 Hypothyroidism, unspecified: Secondary | ICD-10-CM

## 2017-04-24 DIAGNOSIS — I2510 Atherosclerotic heart disease of native coronary artery without angina pectoris: Secondary | ICD-10-CM | POA: Diagnosis not present

## 2017-04-24 DIAGNOSIS — R0602 Shortness of breath: Secondary | ICD-10-CM

## 2017-04-24 DIAGNOSIS — E785 Hyperlipidemia, unspecified: Secondary | ICD-10-CM | POA: Insufficient documentation

## 2017-04-24 HISTORY — DX: Atherosclerotic heart disease of native coronary artery without angina pectoris: I25.10

## 2017-04-24 MED ORDER — ROSUVASTATIN CALCIUM 20 MG PO TABS: 20.0000 mg | ORAL_TABLET | Freq: Every day | ORAL | 5 refills | Status: DC

## 2017-04-24 NOTE — Patient Instructions (Addendum)
Medication Instructions:  START ROSUVASTATIN 20 MG DAILY   START ASPIRIN 81 MG DAILY  Labwork: FASTING LP/CMET/TSH/FT4 IN 6 WEEKS  Testing/Procedures: Your physician has requested that you have an exercise tolerance test. For further information please visit [email or web address]. Please also follow instruction sheet, as given.  Follow-Up: Your physician wants you to follow-up in: [place] will receive a reminder letter in the mail two months in advance. If you don't receive a letter, please call our office to schedule the follow-up appointment.  If you need a refill on your cardiac medications before your next appointment, please call your pharmacy.

## 2017-05-22 DIAGNOSIS — E7800 Pure hypercholesterolemia, unspecified: Secondary | ICD-10-CM | POA: Diagnosis not present

## 2017-05-22 DIAGNOSIS — M4850XA Collapsed vertebra, not elsewhere classified, site unspecified, initial encounter for fracture: Secondary | ICD-10-CM | POA: Diagnosis not present

## 2017-05-22 DIAGNOSIS — M150 Primary generalized (osteo)arthritis: Secondary | ICD-10-CM | POA: Diagnosis not present

## 2017-05-29 ENCOUNTER — Inpatient Hospital Stay (HOSPITAL_COMMUNITY): Admission: RE | Admit: 2017-05-29 | Payer: Medicare Other | Source: Ambulatory Visit

## 2017-05-31 DIAGNOSIS — E669 Obesity, unspecified: Secondary | ICD-10-CM | POA: Diagnosis not present

## 2017-05-31 DIAGNOSIS — M150 Primary generalized (osteo)arthritis: Secondary | ICD-10-CM | POA: Diagnosis not present

## 2017-05-31 DIAGNOSIS — M25561 Pain in right knee: Secondary | ICD-10-CM | POA: Diagnosis not present

## 2017-05-31 DIAGNOSIS — M25562 Pain in left knee: Secondary | ICD-10-CM | POA: Diagnosis not present

## 2017-05-31 DIAGNOSIS — M4854XA Collapsed vertebra, not elsewhere classified, thoracic region, initial encounter for fracture: Secondary | ICD-10-CM | POA: Diagnosis not present

## 2017-05-31 DIAGNOSIS — Z6837 Body mass index (BMI) 37.0-37.9, adult: Secondary | ICD-10-CM | POA: Diagnosis not present

## 2017-05-31 DIAGNOSIS — M810 Age-related osteoporosis without current pathological fracture: Secondary | ICD-10-CM | POA: Diagnosis not present

## 2017-07-19 ENCOUNTER — Telehealth (HOSPITAL_COMMUNITY): Payer: Self-pay

## 2017-07-19 NOTE — Telephone Encounter (Signed)
Encounter complete. 

## 2017-07-24 ENCOUNTER — Ambulatory Visit (HOSPITAL_COMMUNITY)
Admission: RE | Admit: 2017-07-24 | Discharge: 2017-07-24 | Disposition: A | Discharge status: Home / Self Care | Payer: Medicare Other | Source: Ambulatory Visit | Attending: Cardiology | Admitting: Cardiology

## 2017-07-24 DIAGNOSIS — R0602 Shortness of breath: Secondary | ICD-10-CM

## 2017-07-24 LAB — EXERCISE TOLERANCE TEST
CHL CUP MPHR: 149 {beats}/min
CHL CUP RESTING HR STRESS: 73 {beats}/min
CSEPEDS: 1 s
CSEPEW: 5.8 METS
CSEPPHR: 151 {beats}/min
Exercise duration (min): 4 min
Percent HR: 101 %
RPE: 18

## 2017-09-26 ENCOUNTER — Other Ambulatory Visit: Payer: Self-pay | Admitting: Cardiovascular Disease

## 2017-09-26 NOTE — Telephone Encounter (Signed)
Please review for refill.  

## 2017-09-27 NOTE — Telephone Encounter (Signed)
Spoke with patient and she will get follow up labs in January at PCP. Per patient she does not need filled before labs. Will refuse refill until labs reviewed

## 2017-10-05 ENCOUNTER — Other Ambulatory Visit: Payer: Self-pay

## 2017-10-05 MED ORDER — ROSUVASTATIN CALCIUM 20 MG PO TABS: 20.0000 mg | ORAL_TABLET | Freq: Every day | ORAL | 5 refills | Status: DC

## 2018-02-08 ENCOUNTER — Other Ambulatory Visit: Payer: Self-pay

## 2018-02-08 MED ORDER — ROSUVASTATIN CALCIUM 20 MG PO TABS: 20.0000 mg | ORAL_TABLET | Freq: Every day | ORAL | 5 refills | Status: DC

## 2018-05-22 DIAGNOSIS — M150 Primary generalized (osteo)arthritis: Secondary | ICD-10-CM | POA: Diagnosis not present

## 2018-05-22 DIAGNOSIS — Z6836 Body mass index (BMI) 36.0-36.9, adult: Secondary | ICD-10-CM | POA: Diagnosis not present

## 2018-05-22 DIAGNOSIS — M25562 Pain in left knee: Secondary | ICD-10-CM | POA: Diagnosis not present

## 2018-05-22 DIAGNOSIS — E669 Obesity, unspecified: Secondary | ICD-10-CM | POA: Diagnosis not present

## 2018-05-22 DIAGNOSIS — M25561 Pain in right knee: Secondary | ICD-10-CM | POA: Diagnosis not present

## 2018-06-24 DIAGNOSIS — R9389 Abnormal findings on diagnostic imaging of other specified body structures: Secondary | ICD-10-CM | POA: Diagnosis not present

## 2018-06-24 DIAGNOSIS — E669 Obesity, unspecified: Secondary | ICD-10-CM | POA: Diagnosis not present

## 2018-06-24 DIAGNOSIS — E7800 Pure hypercholesterolemia, unspecified: Secondary | ICD-10-CM | POA: Diagnosis not present

## 2018-06-24 DIAGNOSIS — Z0000 Encounter for general adult medical examination without abnormal findings: Secondary | ICD-10-CM | POA: Diagnosis not present

## 2018-06-24 DIAGNOSIS — E2839 Other primary ovarian failure: Secondary | ICD-10-CM | POA: Diagnosis not present

## 2018-06-24 DIAGNOSIS — Z1389 Encounter for screening for other disorder: Secondary | ICD-10-CM | POA: Diagnosis not present

## 2018-06-24 DIAGNOSIS — K222 Esophageal obstruction: Secondary | ICD-10-CM | POA: Diagnosis not present

## 2018-06-24 DIAGNOSIS — R7303 Prediabetes: Secondary | ICD-10-CM | POA: Diagnosis not present

## 2018-06-24 DIAGNOSIS — Z6835 Body mass index (BMI) 35.0-35.9, adult: Secondary | ICD-10-CM | POA: Diagnosis not present

## 2018-06-24 DIAGNOSIS — E559 Vitamin D deficiency, unspecified: Secondary | ICD-10-CM | POA: Diagnosis not present

## 2018-08-08 DIAGNOSIS — Z23 Encounter for immunization: Secondary | ICD-10-CM | POA: Diagnosis not present

## 2018-09-03 DIAGNOSIS — R1310 Dysphagia, unspecified: Secondary | ICD-10-CM | POA: Diagnosis not present

## 2018-09-03 DIAGNOSIS — Z86010 Personal history of colonic polyps: Secondary | ICD-10-CM | POA: Diagnosis not present

## 2018-10-09 HISTORY — PX: POLYPECTOMY: SHX149

## 2018-11-13 ENCOUNTER — Other Ambulatory Visit: Payer: Self-pay | Admitting: Obstetrics and Gynecology

## 2018-11-13 ENCOUNTER — Other Ambulatory Visit (HOSPITAL_COMMUNITY)
Admission: RE | Admit: 2018-11-13 | Discharge: 2018-11-13 | Disposition: A | Discharge status: Home / Self Care | Payer: Medicare Other | Source: Ambulatory Visit | Attending: Obstetrics and Gynecology | Admitting: Obstetrics and Gynecology

## 2018-11-13 DIAGNOSIS — N3281 Overactive bladder: Secondary | ICD-10-CM | POA: Diagnosis not present

## 2018-11-13 DIAGNOSIS — Z124 Encounter for screening for malignant neoplasm of cervix: Secondary | ICD-10-CM | POA: Insufficient documentation

## 2018-11-13 DIAGNOSIS — Z780 Asymptomatic menopausal state: Secondary | ICD-10-CM | POA: Diagnosis not present

## 2018-11-13 DIAGNOSIS — N819 Female genital prolapse, unspecified: Secondary | ICD-10-CM | POA: Diagnosis not present

## 2018-11-13 DIAGNOSIS — R9389 Abnormal findings on diagnostic imaging of other specified body structures: Secondary | ICD-10-CM | POA: Diagnosis not present

## 2018-11-18 LAB — CYTOLOGY - PAP
Diagnosis: NEGATIVE
HPV: NOT DETECTED

## 2018-12-09 DIAGNOSIS — K219 Gastro-esophageal reflux disease without esophagitis: Secondary | ICD-10-CM | POA: Diagnosis not present

## 2018-12-09 DIAGNOSIS — K640 First degree hemorrhoids: Secondary | ICD-10-CM | POA: Diagnosis not present

## 2018-12-09 DIAGNOSIS — D123 Benign neoplasm of transverse colon: Secondary | ICD-10-CM | POA: Diagnosis not present

## 2018-12-09 DIAGNOSIS — K5730 Diverticulosis of large intestine without perforation or abscess without bleeding: Secondary | ICD-10-CM | POA: Diagnosis not present

## 2018-12-09 DIAGNOSIS — K449 Diaphragmatic hernia without obstruction or gangrene: Secondary | ICD-10-CM | POA: Diagnosis not present

## 2018-12-09 DIAGNOSIS — R1310 Dysphagia, unspecified: Secondary | ICD-10-CM | POA: Diagnosis not present

## 2018-12-09 DIAGNOSIS — Z86010 Personal history of colonic polyps: Secondary | ICD-10-CM | POA: Diagnosis not present

## 2018-12-09 DIAGNOSIS — K222 Esophageal obstruction: Secondary | ICD-10-CM | POA: Diagnosis not present

## 2018-12-09 DIAGNOSIS — K317 Polyp of stomach and duodenum: Secondary | ICD-10-CM | POA: Diagnosis not present

## 2018-12-11 DIAGNOSIS — D123 Benign neoplasm of transverse colon: Secondary | ICD-10-CM | POA: Diagnosis not present

## 2019-02-10 DIAGNOSIS — E7800 Pure hypercholesterolemia, unspecified: Secondary | ICD-10-CM | POA: Diagnosis not present

## 2019-02-10 DIAGNOSIS — R7303 Prediabetes: Secondary | ICD-10-CM | POA: Diagnosis not present

## 2019-02-10 DIAGNOSIS — M4850XA Collapsed vertebra, not elsewhere classified, site unspecified, initial encounter for fracture: Secondary | ICD-10-CM | POA: Diagnosis not present

## 2019-02-10 DIAGNOSIS — K222 Esophageal obstruction: Secondary | ICD-10-CM | POA: Diagnosis not present

## 2019-02-10 DIAGNOSIS — R9389 Abnormal findings on diagnostic imaging of other specified body structures: Secondary | ICD-10-CM | POA: Diagnosis not present

## 2019-04-08 DIAGNOSIS — R7303 Prediabetes: Secondary | ICD-10-CM | POA: Diagnosis not present

## 2019-04-08 DIAGNOSIS — E039 Hypothyroidism, unspecified: Secondary | ICD-10-CM | POA: Diagnosis not present

## 2019-04-08 DIAGNOSIS — E7800 Pure hypercholesterolemia, unspecified: Secondary | ICD-10-CM | POA: Diagnosis not present

## 2019-04-25 DIAGNOSIS — E7800 Pure hypercholesterolemia, unspecified: Secondary | ICD-10-CM | POA: Diagnosis not present

## 2019-04-25 DIAGNOSIS — E039 Hypothyroidism, unspecified: Secondary | ICD-10-CM | POA: Diagnosis not present

## 2019-07-07 ENCOUNTER — Other Ambulatory Visit: Payer: Self-pay | Admitting: Internal Medicine

## 2019-07-07 DIAGNOSIS — Z23 Encounter for immunization: Secondary | ICD-10-CM | POA: Diagnosis not present

## 2019-07-07 DIAGNOSIS — Z1389 Encounter for screening for other disorder: Secondary | ICD-10-CM | POA: Diagnosis not present

## 2019-07-07 DIAGNOSIS — E039 Hypothyroidism, unspecified: Secondary | ICD-10-CM | POA: Diagnosis not present

## 2019-07-07 DIAGNOSIS — Z0000 Encounter for general adult medical examination without abnormal findings: Secondary | ICD-10-CM | POA: Diagnosis not present

## 2019-07-07 DIAGNOSIS — R7303 Prediabetes: Secondary | ICD-10-CM | POA: Diagnosis not present

## 2019-07-07 DIAGNOSIS — M8588 Other specified disorders of bone density and structure, other site: Secondary | ICD-10-CM | POA: Diagnosis not present

## 2019-07-07 DIAGNOSIS — M4850XA Collapsed vertebra, not elsewhere classified, site unspecified, initial encounter for fracture: Secondary | ICD-10-CM | POA: Diagnosis not present

## 2019-07-07 DIAGNOSIS — Z1231 Encounter for screening mammogram for malignant neoplasm of breast: Secondary | ICD-10-CM

## 2019-07-07 DIAGNOSIS — E7800 Pure hypercholesterolemia, unspecified: Secondary | ICD-10-CM | POA: Diagnosis not present

## 2019-07-07 DIAGNOSIS — K219 Gastro-esophageal reflux disease without esophagitis: Secondary | ICD-10-CM | POA: Diagnosis not present

## 2019-07-07 DIAGNOSIS — R030 Elevated blood-pressure reading, without diagnosis of hypertension: Secondary | ICD-10-CM | POA: Diagnosis not present

## 2019-07-07 DIAGNOSIS — Z6835 Body mass index (BMI) 35.0-35.9, adult: Secondary | ICD-10-CM | POA: Diagnosis not present

## 2019-09-23 ENCOUNTER — Other Ambulatory Visit: Payer: Self-pay

## 2019-09-23 ENCOUNTER — Ambulatory Visit
Admission: RE | Admit: 2019-09-23 | Discharge: 2019-09-23 | Disposition: A | Discharge status: Home / Self Care | Payer: Medicare Other | Source: Ambulatory Visit | Attending: Internal Medicine | Admitting: Internal Medicine

## 2019-09-23 DIAGNOSIS — M8588 Other specified disorders of bone density and structure, other site: Secondary | ICD-10-CM

## 2019-09-23 DIAGNOSIS — Z1231 Encounter for screening mammogram for malignant neoplasm of breast: Secondary | ICD-10-CM

## 2019-09-23 IMAGING — MG DIGITAL SCREENING BILAT W/ TOMO W/ CAD
8 series · 8 of 24 positions shown · non-contrast
Comparison: Previous exam(s).

CLINICAL DATA: Screening.

EXAM:
DIGITAL SCREENING BILATERAL MAMMOGRAM WITH TOMO AND CAD

[R MLO synth-2D]
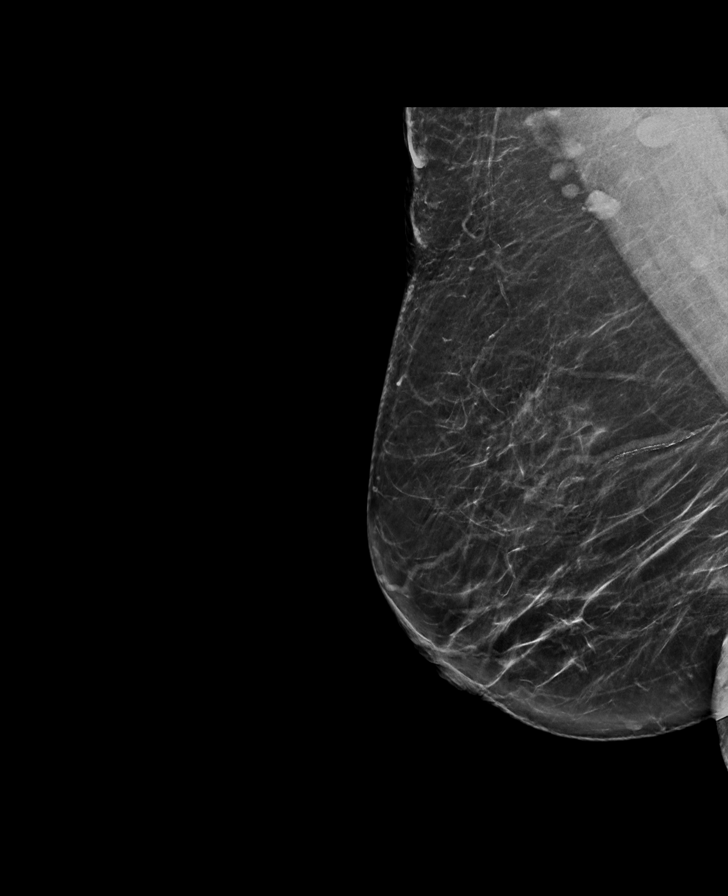

[L CC synth-2D]
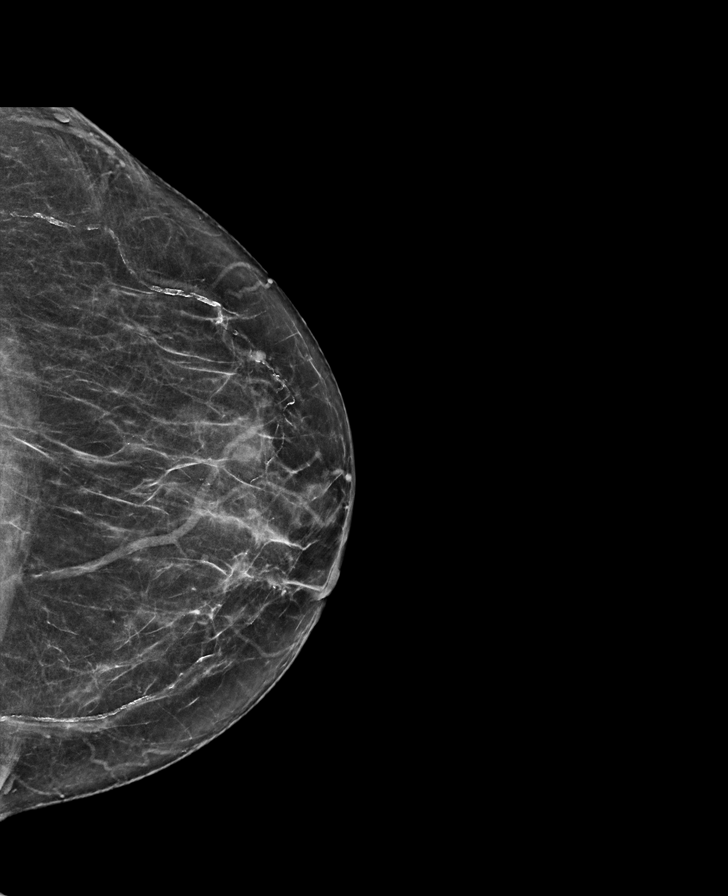

[L MLO synth-2D]
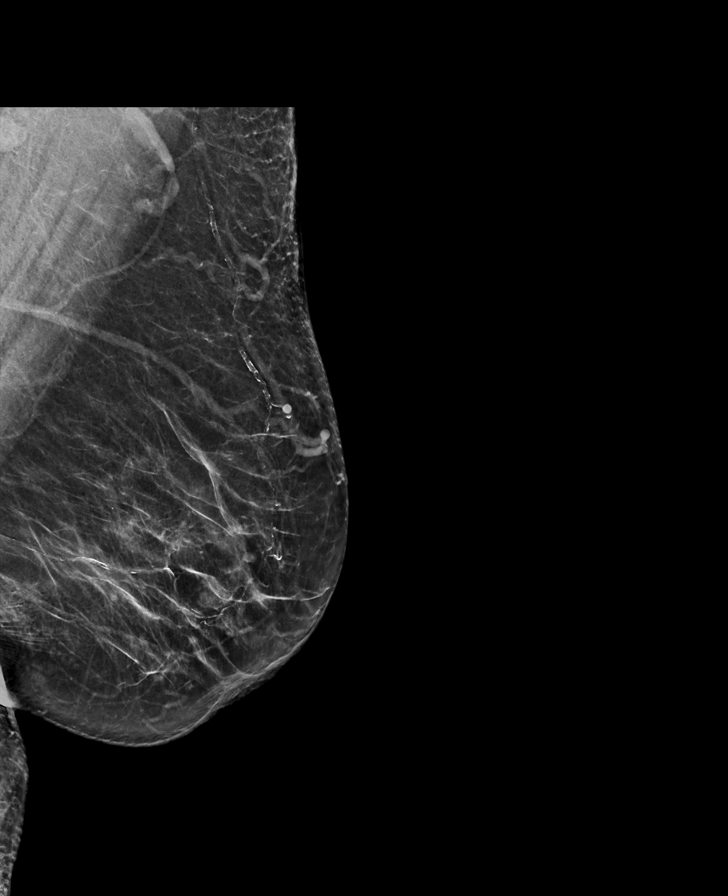

[R CC synth-2D]
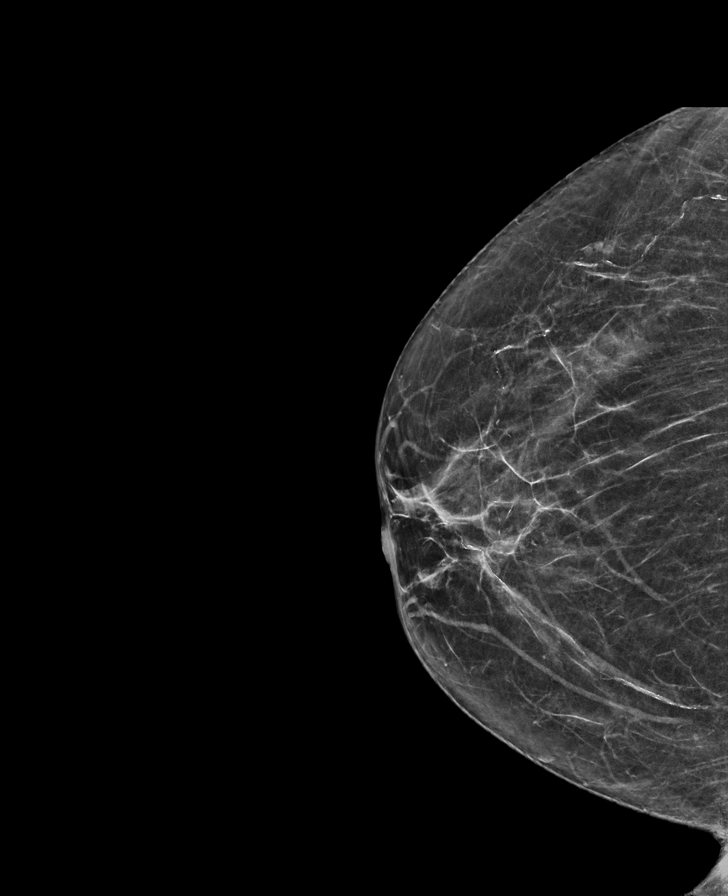

[R CC tomo · tomo slice 27/54.0]
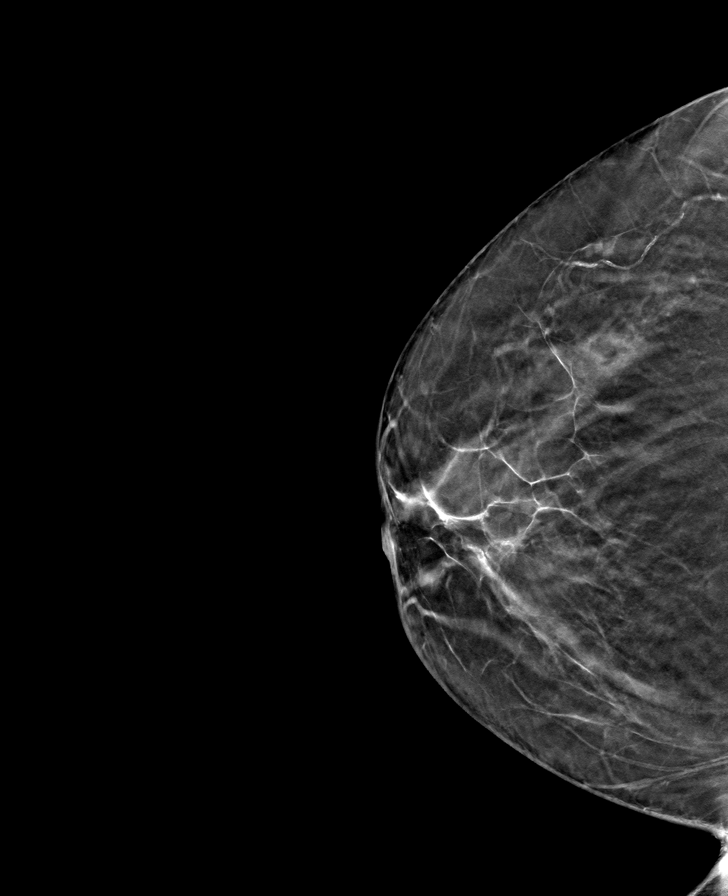

[L CC tomo · tomo slice 30/59.0]
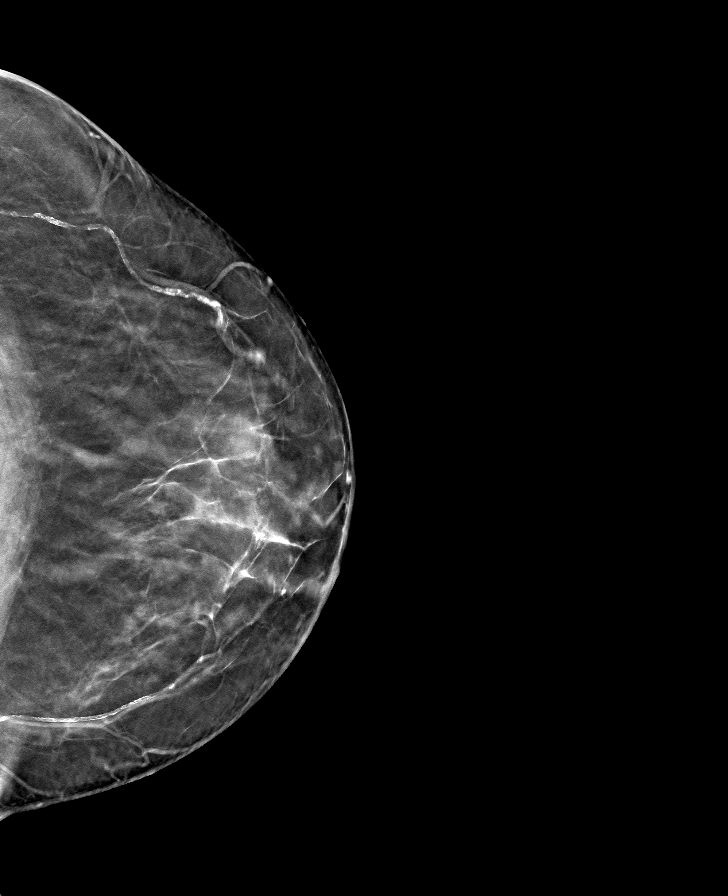

[L MLO tomo · tomo slice 37/74.0]
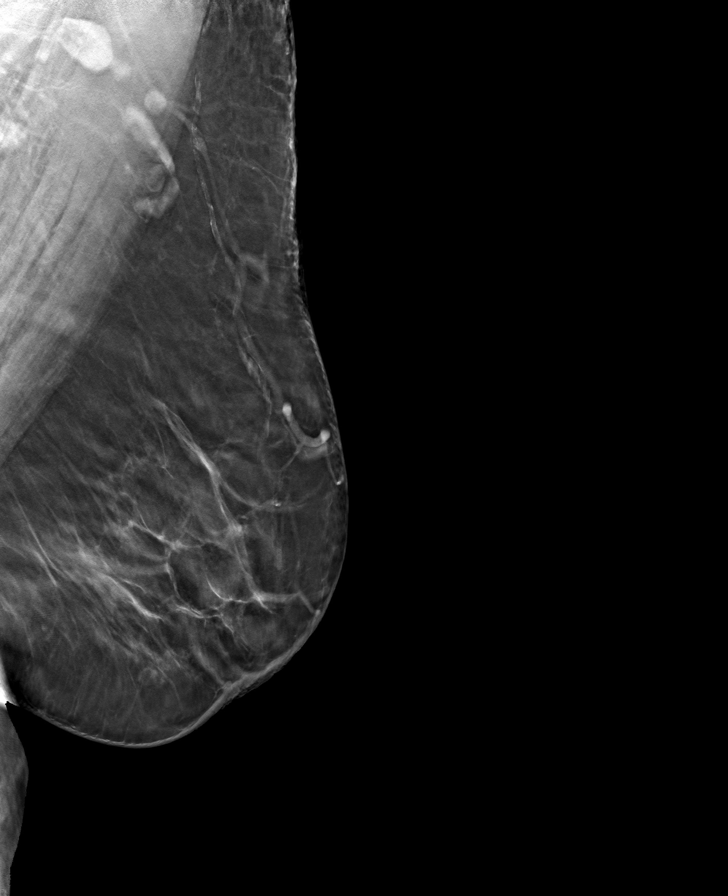

[R MLO tomo · tomo slice 35/70.0]
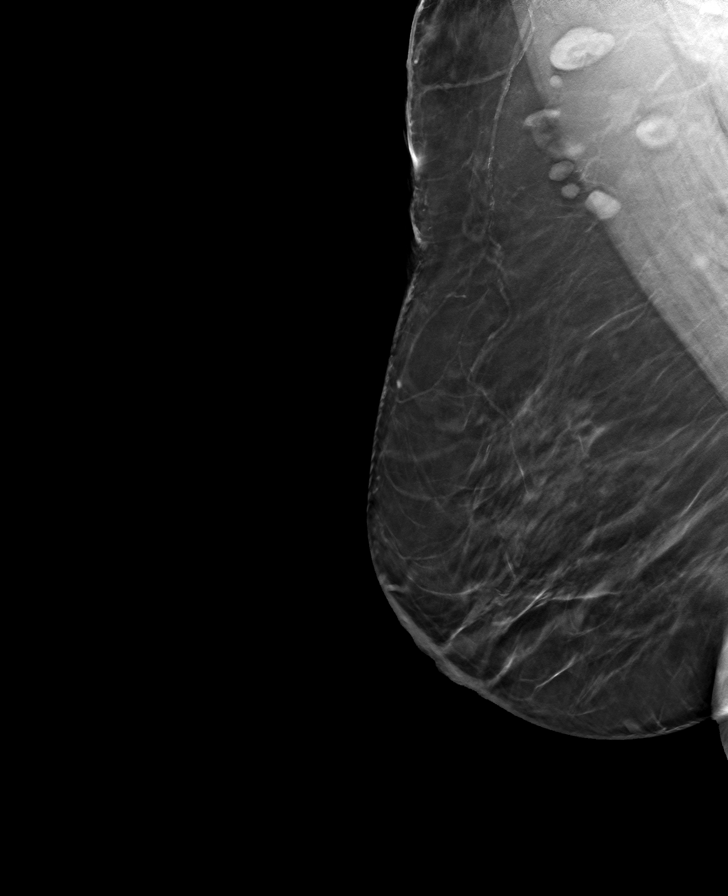

[8 of 24 positions shown; findings below may reference images not displayed]

ACR Breast Density Category b: There are scattered areas of
fibroglandular density.
FINDINGS: There are no findings suspicious for malignancy. Images were
processed with CAD.
IMPRESSION: No mammographic evidence of malignancy. A result letter of this
screening mammogram will be mailed directly to the patient.

RECOMMENDATION:
Screening mammogram in one year. (Code:[TQ])

BI-RADS CATEGORY  1: Negative.

## 2020-03-02 ENCOUNTER — Encounter: Payer: Self-pay | Admitting: General Practice

## 2021-01-04 ENCOUNTER — Ambulatory Visit (INDEPENDENT_AMBULATORY_CARE_PROVIDER_SITE_OTHER): Payer: Medicare Other

## 2021-01-04 ENCOUNTER — Ambulatory Visit (INDEPENDENT_AMBULATORY_CARE_PROVIDER_SITE_OTHER): Payer: Medicare Other | Admitting: Orthopaedic Surgery

## 2021-01-04 VITALS — Ht 63.0 in | Wt 198.0 lb

## 2021-01-04 DIAGNOSIS — G8929 Other chronic pain: Secondary | ICD-10-CM | POA: Diagnosis not present

## 2021-01-04 DIAGNOSIS — M1712 Unilateral primary osteoarthritis, left knee: Secondary | ICD-10-CM | POA: Insufficient documentation

## 2021-01-04 DIAGNOSIS — M25562 Pain in left knee: Secondary | ICD-10-CM

## 2021-01-04 NOTE — Progress Notes (Signed)
Office Visit Note   Patient: Katie Weber           Date of Birth: 04-23-1946           MRN: [id number] Visit Date: 01/04/2021              Requested by: Lanice Shirts, MD [address] [place],  [place] [address] PCP: Lanice Shirts, MD   Assessment & Plan: Visit Diagnoses:  1. Chronic pain of left knee   2. Unilateral primary osteoarthritis, left knee     Plan: We did talk in detail about knee replacement surgery for her left knee.  I have recommended this to her given the severity of her left knee pain combined with arthritic findings on x-ray as well as the failure of conservative treatment for over 12 months including all the a forementioned conservative treatment modalities and measures.  I did talk about knee replacement surgery showing her x-rays and the knee replacement model.  I described the risk and benefits of surgery.  We talked about the interoperative and postoperative course and what to expect.  All questions and concerns were answered and addressed.  We will work on getting this scheduled.  Follow-Up Instructions: Return for 2 weeks post-op.   Orders:  Orders Placed This Encounter  Procedures  . XR Knee 1-2 Views Left   No orders of the defined types were placed in this encounter.     Procedures: No procedures performed   Clinical Data: No additional findings.   Subjective: Chief Complaint  Patient presents with  . Left Knee - Pain  The patient is a very pleasant 75 year old female who is referred to me for evaluation treatment of severe end-stage arthritis of her left knee.  She is already had conservative treatment for many years now by Dr. Trudie Reed with multiple steroid injections in her left knee over the years.  She is also worked on activity modification and quad strengthening exercises.  She is taking anti-inflammatories.  She has tried weight loss as well.  At this point her left knee pain is daily.  She ambulates  using a to offload that knee.  She has tried and failed conservative treatment for multiple years now without left knee.  Her pain is now detriment affecting her mobility, her quality of life and activities day living.  She has had a recent fall due to the balance problems with her knee and pain and this is actually exacerbated her pain even more.  She does not have any active medical issues and is not a diabetic.  She is not a smoker.  Her husband is with her today.  HPI  Review of Systems She currently denies any headache, chest pain, shortness of breath, fever, chills, nausea, vomiting  Objective: Vital Signs: Ht 5\' 3"  (1.6 m)   Wt 198 lb (89.8 kg)   BMI 35.07 kg/m   Physical Exam She is alert and orient x3 and in no acute distress Ortho Exam Examination of her left knee show severe patellofemoral crepitation.  There is significant pain with her left knee throughout the flexion extension arc.  There is significant medial joint line tenderness and varus malalignment of that knee that is not correctable. Specialty Comments:  No specialty comments available.  Imaging: XR Knee 1-2 Views Left  Result Date: 01/04/2021 2 views of the left knee show severe arthritic changes.  There is varus malalignment.  There is bone-on-bone wear of the medial compartment as  well as the patellofemoral joint.  There are large osteophytes in all 3 compartments.    PMFS History: Patient Active Problem List   Diagnosis Date Noted  . Unilateral primary osteoarthritis, left knee 01/04/2021  . Coronary artery calcification 04/24/2017  . Hyperlipidemia 04/24/2017  . Acute bronchitis 09/29/2014  . SBO (small bowel obstruction) ([place]) 09/27/2014  . Nausea and vomiting 09/27/2014  . Hypothyroidism 09/27/2014  . Leukocytosis 09/27/2014   Past Medical History:  Diagnosis Date  . Arthritis    arthritis fingers  . Coronary artery calcification 04/24/2017  . Diverticulitis    x2 -last 1'17 tx. outpatient.  .  Environmental and seasonal allergies    2014 Possibly related to air freshners.  . 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  . Family history of anesthesia complication 62XBM ago   allergic reaction to atropine  . GERD (gastroesophageal reflux disease)    no meds  . History of kidney stones    '80's lithotripsy-UVA x1  . History of kidney stones    x1  . Hyperlipidemia   . Hypothyroidism   . Pneumonia 2014  . Pure hypercholesterolemia     Family History  Problem Relation Age of Onset  . Cancer Other   . Endometrial cancer Mother   . CAD Father   . Breast cancer Maternal Aunt     Past Surgical History:  Procedure Laterality Date  . BALLOON DILATION N/A 05/04/2014   Procedure: BALLOON DILATION;  Surgeon: Garlan Fair, MD;  Location: [facility];  Service: Endoscopy;  Laterality: N/A;  . BREAST LUMPECTOMY Right 1972   benign   . BREAST SURGERY Right    '72 -benign tumor  . childbirth- NVD x2     . COLONOSCOPY WITH PROPOFOL N/A 05/04/2014   Procedure: COLONOSCOPY WITH PROPOFOL;  Surgeon: Garlan Fair, MD;  Location: [facility];  Service: Endoscopy;  Laterality: N/A;  . DILATION AND CURETTAGE OF UTERUS     04-20-14 [facility] endometrial polyp removed.  . ESOPHAGOGASTRODUODENOSCOPY (EGD) WITH PROPOFOL N/A 05/04/2014   Procedure: ESOPHAGOGASTRODUODENOSCOPY (EGD) WITH PROPOFOL;  Surgeon: Garlan Fair, MD;  Location: [facility];  Service: Endoscopy;  Laterality: N/A;. Procedure done in [place], [place]- 5 yrs ago.  Marland Kitchen HYSTEROSCOPY WITH D & C N/A 04/20/2014   Procedure: DILATATION AND CURETTAGE /HYSTEROSCOPY;  Surgeon: [name], MD;  Location: [facility];  Service: Gynecology;  Laterality: N/A;  . LITHOTRIPSY    . TUBAL LIGATION     Social History   Occupational History  . Not on file  Tobacco Use  . Smoking status: Never Smoker  . Smokeless tobacco: Never Used  Substance and Sexual Activity  .  Alcohol use: Yes    Comment: rare social  . Drug use: No  . Sexual activity: Not Currently

## 2021-02-01 ENCOUNTER — Other Ambulatory Visit: Payer: Self-pay | Admitting: Physician Assistant

## 2021-02-11 ENCOUNTER — Encounter (HOSPITAL_COMMUNITY)
Admission: RE | Admit: 2021-02-11 | Discharge: 2021-02-11 | Disposition: A | Discharge status: Home / Self Care | Payer: Medicare Other | Source: Ambulatory Visit | Attending: Orthopaedic Surgery | Admitting: Orthopaedic Surgery

## 2021-02-11 ENCOUNTER — Encounter (HOSPITAL_COMMUNITY): Payer: Self-pay

## 2021-02-11 ENCOUNTER — Other Ambulatory Visit: Payer: Self-pay

## 2021-02-11 DIAGNOSIS — Z01818 Encounter for other preprocedural examination: Secondary | ICD-10-CM | POA: Diagnosis not present

## 2021-02-11 DIAGNOSIS — Z20822 Contact with and (suspected) exposure to covid-19: Secondary | ICD-10-CM | POA: Insufficient documentation

## 2021-02-11 HISTORY — DX: Unspecified asthma, uncomplicated: J45.909

## 2021-02-11 HISTORY — DX: Tremor, unspecified: R25.1

## 2021-02-11 HISTORY — DX: Anemia, unspecified: D64.9

## 2021-02-11 LAB — CBC
HCT: 41.5 % (ref 36.0–46.0)
Hemoglobin: 13.2 g/dL (ref 12.0–15.0)
MCH: 30.3 pg (ref 26.0–34.0)
MCHC: 31.8 g/dL (ref 30.0–36.0)
MCV: 95.4 fL (ref 80.0–100.0)
Platelets: 285 10*3/uL (ref 150–400)
RBC: 4.35 MIL/uL (ref 3.87–5.11)
RDW: 13.3 % (ref 11.5–15.5)
WBC: 6.5 10*3/uL (ref 4.0–10.5)
nRBC: 0 % (ref 0.0–0.2)

## 2021-02-11 LAB — SURGICAL PCR SCREEN
MRSA, PCR: NEGATIVE
Staphylococcus aureus: NEGATIVE

## 2021-02-11 LAB — BASIC METABOLIC PANEL
Anion gap: 7 (ref 5–15)
BUN: 7 mg/dL — ABNORMAL LOW (ref 8–23)
CO2: 24 mmol/L (ref 22–32)
Calcium: 9.2 mg/dL (ref 8.9–10.3)
Chloride: 108 mmol/L (ref 98–111)
Creatinine, Ser: 0.83 mg/dL (ref 0.44–1.00)
GFR, Estimated: >60 mL/min (ref >60)
Glucose, Bld: 101 mg/dL — ABNORMAL HIGH (ref 70–99)
Potassium: 3.9 mmol/L (ref 3.5–5.1)
Sodium: 139 mmol/L (ref 135–145)

## 2021-02-11 LAB — SARS CORONAVIRUS 2 (TAT 6-24 HRS): SARS Coronavirus 2: NEGATIVE

## 2021-02-11 LAB — TYPE AND SCREEN
ABO/RH(D): A POS
Antibody Screen: NEGATIVE

## 2021-02-11 NOTE — Progress Notes (Signed)
PCP - Leeroy Cha, MD Cardiologist - Denies Rheumatologist- Gavin Pound  PPM/ICD - Denies  Chest x-ray - N/A EKG - 02/11/21 Stress Test - 07/24/17, normal ECHO - Denies Cardiac Cath - Denies  Sleep Study - Denies  Patient denies being diabetic.  Blood Thinner Instructions: N/A Aspirin Instructions: N/A  ERAS Protcol - Yes PRE-SURGERY Ensure or G2- Ensure given  COVID TEST- 02/11/21   Anesthesia review: No  Patient denies shortness of breath, fever, cough and chest pain at PAT appointment   All instructions explained to the patient, with a verbal understanding of the material. Patient agrees to go over the instructions while at home for a better understanding. Patient also instructed to self quarantine after being tested for COVID-19. The opportunity to ask questions was provided.

## 2021-02-11 NOTE — Pre-Procedure Instructions (Signed)
Surgical Instructions:    Your procedure is scheduled on Tuesday, 02/15/21 (2:03 PM- 3:59 PM).  Report to [facility] Entrance "A" at 30 Noon, then check in with the Admitting office.  Call this number if you have any questions prior to, or have any problems the morning of surgery:  [phone]    Remember:  Do not eat after midnight the night before your surgery.  You may drink clear liquids until 11:00 AM the morning of your surgery.   Clear liquids allowed are: Water, Non-Citrus Juices (without pulp), Carbonated Beverages, Clear Tea, Black Coffee Only, and Gatorade.   Enhanced Recovery after Surgery for Orthopedics Enhanced Recovery after Surgery is a protocol used to improve the stress on your body and your recovery after surgery.  Patient Instructions  . The night before surgery:  o No food after midnight. ONLY clear liquids after midnight   . The day of surgery (if you do NOT have diabetes):  o Drink ONE (1) Pre-Surgery Clear Ensure by 11:00 AM the morning of surgery.   o This drink was given to you during your hospital pre-op appointment visit. o Nothing else to drink after completing the Pre-Surgery Clear Ensure.         If you have questions, please contact your surgeon's office.      Take these medicines the morning of surgery with A SIP OF WATER: levothyroxine (SYNTHROID, LEVOTHROID)  rosuvastatin (CRESTOR)   As of today, STOP taking any Aspirin (unless otherwise instructed by your surgeon) Aleve, Naproxen, Ibuprofen, Motrin, Advil, Goody's, BC's, all herbal medications, fish oil, and all vitamins.              Special instructions:   [place]- Preparing For Surgery  Before surgery, you can play an important role. Because skin is not sterile, your skin needs to be as free of germs as possible. You can reduce the number of germs on your skin by washing with CHG (chlorahexidine gluconate) Soap before surgery.  CHG is an antiseptic cleaner which kills  germs and bonds with the skin to continue killing germs even after washing.    Oral Hygiene is also important to reduce your risk of infection.  Remember - BRUSH YOUR TEETH THE MORNING OF SURGERY WITH YOUR REGULAR TOOTHPASTE  Please do not use if you have an allergy to CHG or antibacterial soaps. If your skin becomes reddened/irritated stop using the CHG.  Do not shave (including legs and underarms) for at least 48 hours prior to first CHG shower. It is OK to shave your face.  Please follow these instructions carefully.   1. Shower the NIGHT BEFORE SURGERY and the MORNING OF SURGERY  2. If you chose to wash your hair, wash your hair first as usual with your normal shampoo.  3. After you shampoo, rinse your hair and body thoroughly to remove the shampoo.  4. Wash Face and genitals (private parts) with your normal soap.   5. Use CHG Soap as you would any other liquid soap. You can apply CHG directly to the skin and wash gently with a scrungie or a clean washcloth.   6. Apply the CHG Soap to your body ONLY FROM THE NECK DOWN.  Do not use on open wounds or open sores. Avoid contact with your eyes, ears, mouth and genitals (private parts). Wash Face and genitals (private parts)  with your normal soap.   7. Wash thoroughly, paying special attention to the area where your surgery will be  performed.  8. Thoroughly rinse your body with warm water from the neck down.  9. DO NOT shower/wash with your normal soap after using and rinsing off the CHG Soap.  10. Pat yourself dry with a CLEAN TOWEL.  11. Wear CLEAN PAJAMAS to bed the night before surgery.  12. Place CLEAN SHEETS on your bed the night before your surgery.  13. DO NOT SLEEP WITH PETS.   Day of Surgery: SHOWER with CHG soap. Brush your teeth WITH YOUR REGULAR TOOTHPASTE. Wear Clean/Comfortable clothing the morning of surgery. Do not apply any deodorants/lotions.   Do not wear jewelry, make up, or nail polish. Do not shave 48  hours prior to surgery.    Do NOT Smoke (Tobacco/Vaping) or drink Alcohol 24 hours prior to your procedure. Do not bring valuables to the hospital. [facility] is not responsible for any belongings or valuables.  If you use a CPAP at night, you may bring all equipment for your overnight stay.   Contacts, glasses, or dentures may not be worn into surgery, please bring cases for these belongings.   For patients admitted to the hospital, discharge time will be determined by your treatment team.   Patients discharged the day of surgery will not be allowed to drive home, and someone needs to stay with them for 24 hours.    Please read over the following fact sheets that you were given.

## 2021-02-14 ENCOUNTER — Telehealth: Payer: Self-pay | Admitting: Orthopaedic Surgery

## 2021-02-14 ENCOUNTER — Telehealth: Payer: Self-pay | Admitting: *Deleted

## 2021-02-14 NOTE — Telephone Encounter (Signed)
Received call from patient and her husband who requested referral for her home health PT be to [facility]. Referral sent to Felton Clinton with [facility]. Will make hospital CM aware also after admission.

## 2021-02-15 ENCOUNTER — Ambulatory Visit (HOSPITAL_COMMUNITY): Payer: Medicare Other | Admitting: Certified Registered Nurse Anesthetist

## 2021-02-15 ENCOUNTER — Encounter (HOSPITAL_COMMUNITY): Payer: Self-pay | Admitting: Orthopaedic Surgery

## 2021-02-15 ENCOUNTER — Observation Stay (HOSPITAL_COMMUNITY): Payer: Medicare Other

## 2021-02-15 ENCOUNTER — Encounter (HOSPITAL_COMMUNITY)
Admission: RE | Disposition: A | Discharge status: Home Health | Payer: Self-pay | Source: Home / Self Care | Attending: Orthopaedic Surgery

## 2021-02-15 ENCOUNTER — Inpatient Hospital Stay (HOSPITAL_COMMUNITY)
Admission: RE | Admit: 2021-02-15 | Discharge: 2021-02-17 | DRG: 470 | Disposition: A | Discharge status: Home Health | Payer: Medicare Other | Attending: Orthopaedic Surgery | Admitting: Orthopaedic Surgery

## 2021-02-15 DIAGNOSIS — Z8701 Personal history of pneumonia (recurrent): Secondary | ICD-10-CM

## 2021-02-15 DIAGNOSIS — Z96652 Presence of left artificial knee joint: Secondary | ICD-10-CM

## 2021-02-15 DIAGNOSIS — M1712 Unilateral primary osteoarthritis, left knee: Secondary | ICD-10-CM | POA: Diagnosis not present

## 2021-02-15 DIAGNOSIS — M25462 Effusion, left knee: Secondary | ICD-10-CM | POA: Diagnosis present

## 2021-02-15 DIAGNOSIS — Z87442 Personal history of urinary calculi: Secondary | ICD-10-CM

## 2021-02-15 DIAGNOSIS — I2510 Atherosclerotic heart disease of native coronary artery without angina pectoris: Secondary | ICD-10-CM | POA: Diagnosis present

## 2021-02-15 DIAGNOSIS — E039 Hypothyroidism, unspecified: Secondary | ICD-10-CM | POA: Diagnosis present

## 2021-02-15 DIAGNOSIS — Z8249 Family history of ischemic heart disease and other diseases of the circulatory system: Secondary | ICD-10-CM

## 2021-02-15 DIAGNOSIS — Z91041 Radiographic dye allergy status: Secondary | ICD-10-CM

## 2021-02-15 DIAGNOSIS — J45909 Unspecified asthma, uncomplicated: Secondary | ICD-10-CM | POA: Diagnosis present

## 2021-02-15 DIAGNOSIS — M170 Bilateral primary osteoarthritis of knee: Principal | ICD-10-CM | POA: Diagnosis present

## 2021-02-15 DIAGNOSIS — E785 Hyperlipidemia, unspecified: Secondary | ICD-10-CM | POA: Diagnosis present

## 2021-02-15 DIAGNOSIS — M19049 Primary osteoarthritis, unspecified hand: Secondary | ICD-10-CM | POA: Diagnosis present

## 2021-02-15 DIAGNOSIS — Z9851 Tubal ligation status: Secondary | ICD-10-CM

## 2021-02-15 DIAGNOSIS — Z880 Allergy status to penicillin: Secondary | ICD-10-CM

## 2021-02-15 DIAGNOSIS — M659 Synovitis and tenosynovitis, unspecified: Secondary | ICD-10-CM | POA: Diagnosis present

## 2021-02-15 HISTORY — PX: TOTAL KNEE ARTHROPLASTY: SHX125

## 2021-02-15 LAB — ABO/RH: ABO/RH(D): A POS

## 2021-02-15 IMAGING — DX DG KNEE 1-2V PORT*L*
1 series · 2 of 2 positions shown · non-contrast
Comparison: None.

CLINICAL DATA: Postop.

EXAM:
PORTABLE LEFT KNEE - 1-2 VIEW

[Series 1: knee · 0.14mm/px · 2 of 2 slices shown]
[im 1/2]
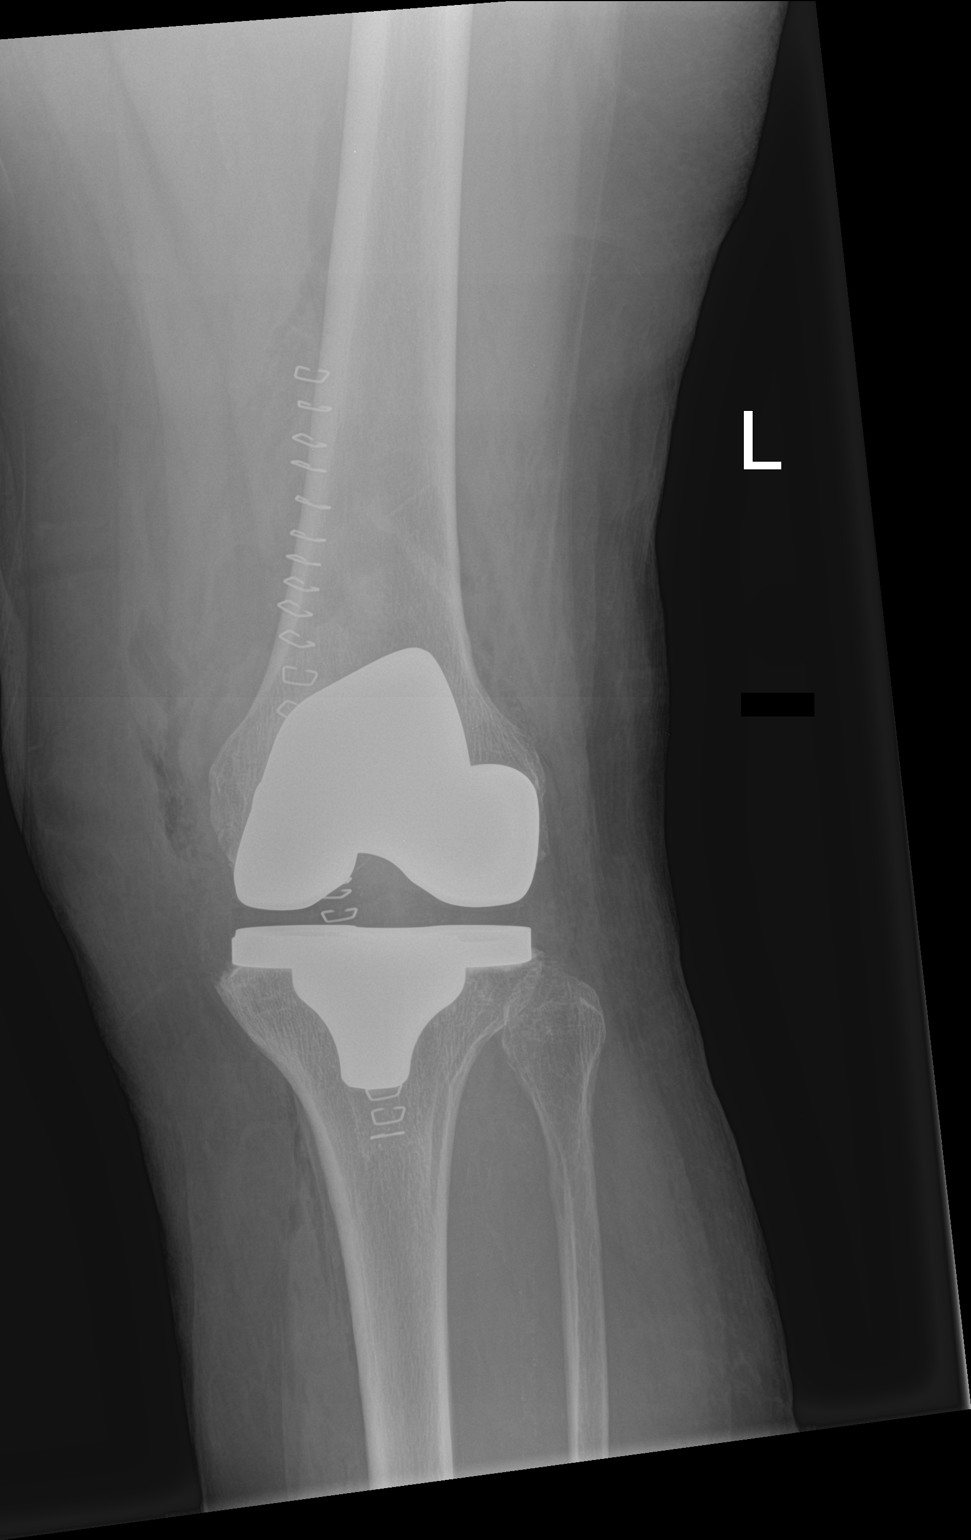
[im 2/2]
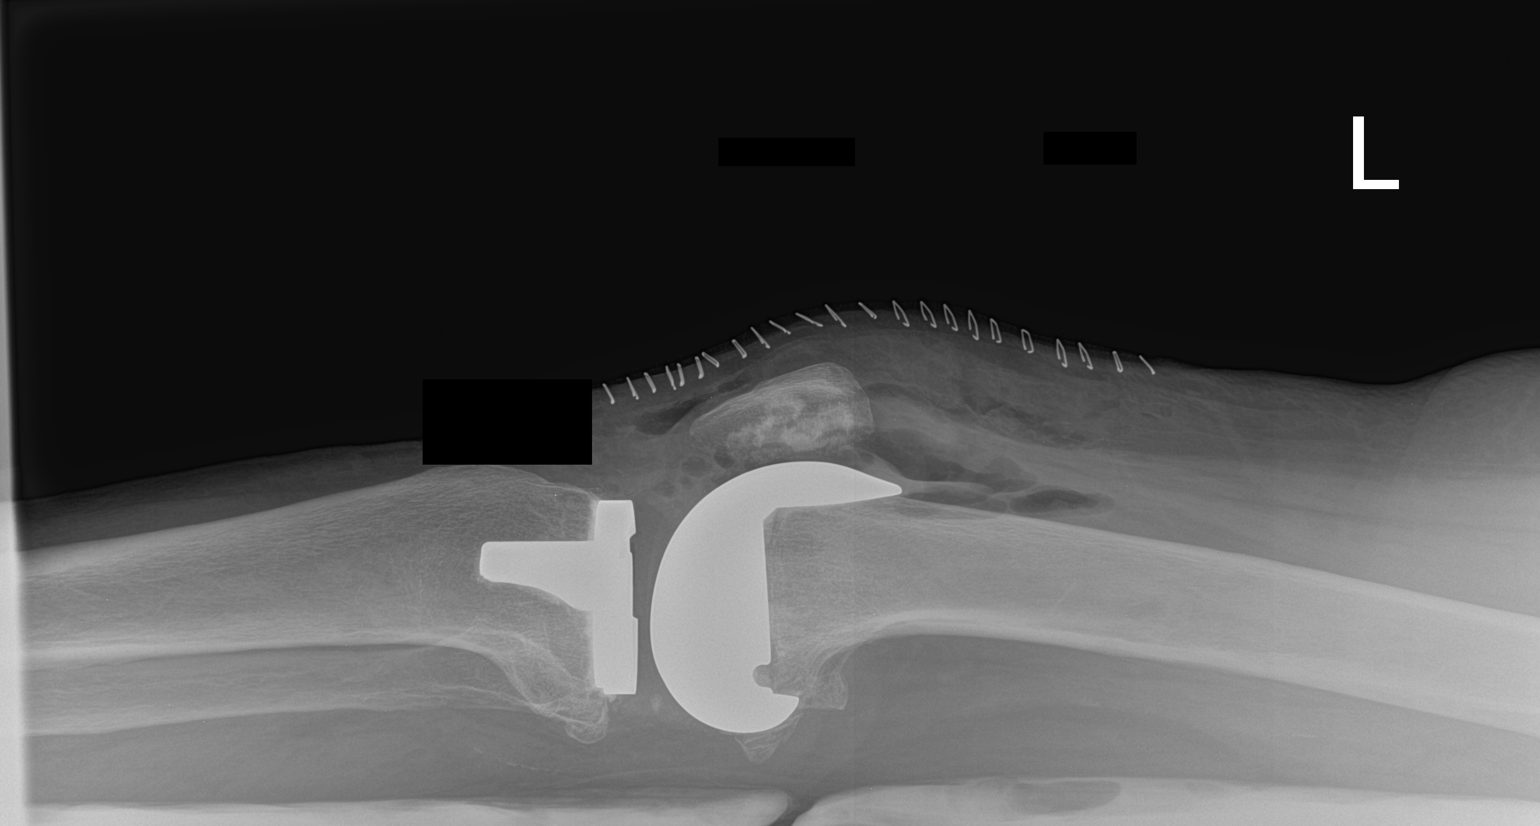

[2 of 2 positions shown; findings below may reference images not displayed]

FINDINGS: Left knee arthroplasty in expected alignment. No periprosthetic
lucency or fracture. There has been patellar resurfacing. Recent
postsurgical change includes air and edema in the soft tissues and
joint space. Anterior skin staples.
IMPRESSION: Left knee arthroplasty without immediate postoperative complication.

## 2021-02-15 SURGERY — ARTHROPLASTY, KNEE, TOTAL
Anesthesia: Monitor Anesthesia Care | Site: Knee | Laterality: Left

## 2021-02-15 MED ORDER — ORAL CARE MOUTH RINSE: 15.0000 mL | Freq: Once | OROMUCOSAL | Status: AC

## 2021-02-15 MED ORDER — POVIDONE-IODINE 10 % EX SWAB: 2.0000 "application " | Freq: Once | CUTANEOUS | Status: DC

## 2021-02-15 MED ORDER — CLINDAMYCIN PHOSPHATE 900 MG/50ML IV SOLN
900.0000 mg | INTRAVENOUS | Status: AC
  Administered 2021-02-15: 900 mg via INTRAVENOUS
  Filled 2021-02-15: qty 50

## 2021-02-15 MED ORDER — PANTOPRAZOLE SODIUM 40 MG PO TBEC
40.0000 mg | DELAYED_RELEASE_TABLET | Freq: Every day | ORAL | Status: DC
  Administered 2021-02-15 – 2021-02-17 (×3): 40 mg via ORAL
  Filled 2021-02-15 (×4): qty 1

## 2021-02-15 MED ORDER — PROPOFOL 500 MG/50ML IV EMUL
INTRAVENOUS | Status: DC | PRN
  Administered 2021-02-15: 75 ug/kg/min via INTRAVENOUS

## 2021-02-15 MED ORDER — FENTANYL CITRATE (PF) 100 MCG/2ML IJ SOLN: 25.0000 ug | INTRAMUSCULAR | Status: DC | PRN

## 2021-02-15 MED ORDER — ONDANSETRON HCL 4 MG PO TABS: 4.0000 mg | ORAL_TABLET | Freq: Four times a day (QID) | ORAL | Status: DC | PRN

## 2021-02-15 MED ORDER — ROPIVACAINE HCL 7.5 MG/ML IJ SOLN
INTRAMUSCULAR | Status: DC | PRN
  Administered 2021-02-15: 20 mL via PERINEURAL

## 2021-02-15 MED ORDER — ALUM & MAG HYDROXIDE-SIMETH 200-200-20 MG/5ML PO SUSP: 30.0000 mL | ORAL | Status: DC | PRN

## 2021-02-15 MED ORDER — MIDAZOLAM HCL 2 MG/2ML IJ SOLN: 1.0000 mg | Freq: Once | INTRAMUSCULAR | Status: AC

## 2021-02-15 MED ORDER — ONDANSETRON HCL 4 MG/2ML IJ SOLN: 4.0000 mg | Freq: Four times a day (QID) | INTRAMUSCULAR | Status: DC | PRN

## 2021-02-15 MED ORDER — ACETAMINOPHEN 325 MG PO TABS
325.0000 mg | ORAL_TABLET | Freq: Four times a day (QID) | ORAL | Status: DC | PRN
Start: 2021-02-16 — End: 2021-02-17

## 2021-02-15 MED ORDER — PHENOL 1.4 % MT LIQD: 1.0000 | OROMUCOSAL | Status: DC | PRN

## 2021-02-15 MED ORDER — BUPIVACAINE IN DEXTROSE 0.75-8.25 % IT SOLN
INTRATHECAL | Status: DC | PRN
  Administered 2021-02-15: 1.8 mL via INTRATHECAL

## 2021-02-15 MED ORDER — CLINDAMYCIN PHOSPHATE 600 MG/50ML IV SOLN
600.0000 mg | Freq: Four times a day (QID) | INTRAVENOUS | Status: AC
  Administered 2021-02-15: 600 mg via INTRAVENOUS
  Filled 2021-02-15: qty 50

## 2021-02-15 MED ORDER — METOCLOPRAMIDE HCL 5 MG PO TABS
5.0000 mg | ORAL_TABLET | Freq: Three times a day (TID) | ORAL | Status: DC | PRN
Start: 2021-02-15 — End: 2021-02-17

## 2021-02-15 MED ORDER — ASCORBIC ACID 500 MG PO TABS
500.0000 mg | ORAL_TABLET | ORAL | Status: DC
  Administered 2021-02-17: 500 mg via ORAL
  Filled 2021-02-15: qty 1

## 2021-02-15 MED ORDER — FENTANYL CITRATE (PF) 100 MCG/2ML IJ SOLN
50.0000 ug | Freq: Once | INTRAMUSCULAR | Status: AC
Start: 2021-02-15 — End: 2021-02-15

## 2021-02-15 MED ORDER — LEVOTHYROXINE SODIUM 100 MCG PO TABS
100.0000 ug | ORAL_TABLET | Freq: Every day | ORAL | Status: DC
  Administered 2021-02-16 – 2021-02-17 (×2): 100 ug via ORAL
  Filled 2021-02-15 (×2): qty 1

## 2021-02-15 MED ORDER — OXYCODONE HCL 5 MG PO TABS
5.0000 mg | ORAL_TABLET | ORAL | Status: DC | PRN
Start: 2021-02-15 — End: 2021-02-17
  Administered 2021-02-15: 5 mg via ORAL
  Administered 2021-02-16: 10 mg via ORAL
  Filled 2021-02-15: qty 1
  Filled 2021-02-15: qty 2

## 2021-02-15 MED ORDER — METOCLOPRAMIDE HCL 5 MG/ML IJ SOLN: 5.0000 mg | Freq: Three times a day (TID) | INTRAMUSCULAR | Status: DC | PRN

## 2021-02-15 MED ORDER — TRANEXAMIC ACID-NACL 1000-0.7 MG/100ML-% IV SOLN
1000.0000 mg | INTRAVENOUS | Status: AC
  Administered 2021-02-15: 1000 mg via INTRAVENOUS
  Filled 2021-02-15: qty 100

## 2021-02-15 MED ORDER — VITAMIN B-12 1000 MCG PO TABS
1000.0000 ug | ORAL_TABLET | ORAL | Status: DC
  Administered 2021-02-17: 1000 ug via ORAL
  Filled 2021-02-15: qty 1

## 2021-02-15 MED ORDER — ADULT MULTIVITAMIN W/MINERALS CH
1.0000 | ORAL_TABLET | ORAL | Status: DC
  Administered 2021-02-17: 1 via ORAL
  Filled 2021-02-15: qty 1

## 2021-02-15 MED ORDER — MENTHOL 3 MG MT LOZG: 1.0000 | LOZENGE | OROMUCOSAL | Status: DC | PRN

## 2021-02-15 MED ORDER — PHENYLEPHRINE HCL-NACL 10-0.9 MG/250ML-% IV SOLN
INTRAVENOUS | Status: DC | PRN
  Administered 2021-02-15: 30 ug/min via INTRAVENOUS

## 2021-02-15 MED ORDER — 0.9 % SODIUM CHLORIDE (POUR BTL) OPTIME
TOPICAL | Status: DC | PRN
  Administered 2021-02-15: 1000 mL

## 2021-02-15 MED ORDER — ZINC GLUCONATE 50 MG PO TABS: 50.0000 mg | ORAL_TABLET | Freq: Every day | ORAL | Status: DC

## 2021-02-15 MED ORDER — MIDAZOLAM HCL 2 MG/2ML IJ SOLN
INTRAMUSCULAR | Status: AC
  Administered 2021-02-15: 1 mg via INTRAVENOUS
  Filled 2021-02-15: qty 2

## 2021-02-15 MED ORDER — METHOCARBAMOL 1000 MG/10ML IJ SOLN
500.0000 mg | Freq: Four times a day (QID) | INTRAVENOUS | Status: DC | PRN
  Filled 2021-02-15: qty 5

## 2021-02-15 MED ORDER — LACTATED RINGERS IV SOLN: INTRAVENOUS | Status: DC

## 2021-02-15 MED ORDER — VITAMIN D 25 MCG (1000 UNIT) PO TABS
1000.0000 [IU] | ORAL_TABLET | ORAL | Status: DC
  Administered 2021-02-17: 1000 [IU] via ORAL
  Filled 2021-02-15: qty 1

## 2021-02-15 MED ORDER — DOCUSATE SODIUM 100 MG PO CAPS
100.0000 mg | ORAL_CAPSULE | Freq: Two times a day (BID) | ORAL | Status: DC
  Administered 2021-02-15 – 2021-02-17 (×4): 100 mg via ORAL
  Filled 2021-02-15 (×5): qty 1

## 2021-02-15 MED ORDER — HYDROMORPHONE HCL 1 MG/ML IJ SOLN
0.5000 mg | INTRAMUSCULAR | Status: DC | PRN
  Administered 2021-02-15 – 2021-02-16 (×2): 1 mg via INTRAVENOUS
  Filled 2021-02-15 (×3): qty 1

## 2021-02-15 MED ORDER — ACETAMINOPHEN 160 MG/5ML PO SOLN: 1000.0000 mg | Freq: Once | ORAL | Status: DC | PRN

## 2021-02-15 MED ORDER — ZINC SULFATE 220 (50 ZN) MG PO CAPS
220.0000 mg | ORAL_CAPSULE | ORAL | Status: DC
  Administered 2021-02-17: 220 mg via ORAL
  Filled 2021-02-15: qty 1

## 2021-02-15 MED ORDER — ONDANSETRON HCL 4 MG/2ML IJ SOLN
INTRAMUSCULAR | Status: DC | PRN
  Administered 2021-02-15: 4 mg via INTRAVENOUS

## 2021-02-15 MED ORDER — SODIUM CHLORIDE 0.9 % IV SOLN: INTRAVENOUS | Status: DC

## 2021-02-15 MED ORDER — OXYCODONE HCL 5 MG/5ML PO SOLN: 5.0000 mg | Freq: Once | ORAL | Status: DC | PRN

## 2021-02-15 MED ORDER — CHLORHEXIDINE GLUCONATE 0.12 % MT SOLN
15.0000 mL | Freq: Once | OROMUCOSAL | Status: AC
  Administered 2021-02-15: 15 mL via OROMUCOSAL
  Filled 2021-02-15: qty 15

## 2021-02-15 MED ORDER — SODIUM CHLORIDE 0.9 % IR SOLN
Status: DC | PRN
  Administered 2021-02-15: 3000 mL

## 2021-02-15 MED ORDER — METHOCARBAMOL 500 MG PO TABS
500.0000 mg | ORAL_TABLET | Freq: Four times a day (QID) | ORAL | Status: DC | PRN
  Administered 2021-02-15: 500 mg via ORAL
  Filled 2021-02-15 (×2): qty 1

## 2021-02-15 MED ORDER — ACETAMINOPHEN 500 MG PO TABS: 1000.0000 mg | ORAL_TABLET | Freq: Once | ORAL | Status: DC | PRN

## 2021-02-15 MED ORDER — OXYCODONE HCL 5 MG PO TABS: 5.0000 mg | ORAL_TABLET | Freq: Once | ORAL | Status: DC | PRN

## 2021-02-15 MED ORDER — ROSUVASTATIN CALCIUM 5 MG PO TABS
10.0000 mg | ORAL_TABLET | ORAL | Status: DC
  Administered 2021-02-16: 10 mg via ORAL
  Filled 2021-02-15 (×2): qty 2

## 2021-02-15 MED ORDER — ACETAMINOPHEN 10 MG/ML IV SOLN: 1000.0000 mg | Freq: Once | INTRAVENOUS | Status: DC | PRN

## 2021-02-15 MED ORDER — FENTANYL CITRATE (PF) 100 MCG/2ML IJ SOLN
INTRAMUSCULAR | Status: AC
  Administered 2021-02-15: 50 ug via INTRAVENOUS
  Filled 2021-02-15: qty 2

## 2021-02-15 MED ORDER — DIPHENHYDRAMINE HCL 12.5 MG/5ML PO ELIX
12.5000 mg | ORAL_SOLUTION | ORAL | Status: DC | PRN
Start: 2021-02-15 — End: 2021-02-17

## 2021-02-15 MED ORDER — OXYCODONE HCL 5 MG PO TABS
10.0000 mg | ORAL_TABLET | ORAL | Status: DC | PRN
  Administered 2021-02-16 (×3): 15 mg via ORAL
  Administered 2021-02-17 (×2): 10 mg via ORAL
  Administered 2021-02-17: 15 mg via ORAL
  Filled 2021-02-15 (×3): qty 3
  Filled 2021-02-15: qty 2
  Filled 2021-02-15: qty 3
  Filled 2021-02-15: qty 2

## 2021-02-15 MED ORDER — MULTIVITAMIN ADULT PO CHEW: 1.0000 | CHEWABLE_TABLET | ORAL | Status: DC

## 2021-02-15 MED ORDER — ASPIRIN 81 MG PO CHEW
81.0000 mg | CHEWABLE_TABLET | Freq: Two times a day (BID) | ORAL | Status: DC
  Administered 2021-02-15 – 2021-02-17 (×4): 81 mg via ORAL
  Filled 2021-02-15 (×5): qty 1

## 2021-02-15 SURGICAL SUPPLY — 69 items
BANDAGE ESMARK 6X9 LF (GAUZE/BANDAGES/DRESSINGS) ×1 IMPLANT
BASEPLATE TIBIAL TRIATHALON 3 (Plate) ×2 IMPLANT
BEARIN INSERT TIBIAL SZ 3 11 (Insert) ×2 IMPLANT
BEARING INSERT TIBIAL SZ 3 11 (Insert) ×1 IMPLANT
BLADE SAG 18X100X1.27 (BLADE) ×2 IMPLANT
BNDG ELASTIC 4X5.8 VLCR STR LF (GAUZE/BANDAGES/DRESSINGS) ×2 IMPLANT
BNDG ELASTIC 6X5.8 VLCR STR LF (GAUZE/BANDAGES/DRESSINGS) ×2 IMPLANT
BNDG ESMARK 6X9 LF (GAUZE/BANDAGES/DRESSINGS) ×2
BOWL SMART MIX CTS (DISPOSABLE) ×2 IMPLANT
CEMENT BONE SIMPLEX SPEEDSET (Cement) ×4 IMPLANT
COVER SURGICAL LIGHT HANDLE (MISCELLANEOUS) ×2 IMPLANT
COVER WAND RF STERILE (DRAPES) ×2 IMPLANT
CUFF TOURN SGL QUICK 34 (TOURNIQUET CUFF) ×1
CUFF TOURN SGL QUICK 42 (TOURNIQUET CUFF) IMPLANT
CUFF TRNQT CYL 34X4.125X (TOURNIQUET CUFF) ×1 IMPLANT
DRAPE EXTREMITY T 121X128X90 (DISPOSABLE) ×2 IMPLANT
DRAPE HALF SHEET 40X57 (DRAPES) ×2 IMPLANT
DRAPE INCISE 23X17 IOBAN STRL (DRAPES) ×1
DRAPE INCISE IOBAN 23X17 STRL (DRAPES) ×1 IMPLANT
DRAPE U-SHAPE 47X51 STRL (DRAPES) ×2 IMPLANT
DRSG PAD ABDOMINAL 8X10 ST (GAUZE/BANDAGES/DRESSINGS) ×2 IMPLANT
DURAPREP 26ML APPLICATOR (WOUND CARE) IMPLANT
ELECT CAUTERY BLADE 6.4 (BLADE) ×2 IMPLANT
ELECT REM PT RETURN 9FT ADLT (ELECTROSURGICAL)
ELECTRODE REM PT RTRN 9FT ADLT (ELECTROSURGICAL) IMPLANT
FACESHIELD WRAPAROUND (MASK) ×8 IMPLANT
FEMORAL PEG DISTAL FIXATION (Orthopedic Implant) ×2 IMPLANT
FEMORAL POSTERIOR STAB SZ4 (Orthopedic Implant) ×2 IMPLANT
GAUZE SPONGE 4X4 12PLY STRL (GAUZE/BANDAGES/DRESSINGS) ×2 IMPLANT
GAUZE XEROFORM 1X8 LF (GAUZE/BANDAGES/DRESSINGS) IMPLANT
GAUZE XEROFORM 5X9 LF (GAUZE/BANDAGES/DRESSINGS) ×2 IMPLANT
GLOVE ORTHO TXT STRL SZ7.5 (GLOVE) ×2 IMPLANT
GLOVE SRG 8 PF TXTR STRL LF DI (GLOVE) ×2 IMPLANT
GLOVE SURG ORTHO 8.0 STRL STRW (GLOVE) ×2 IMPLANT
GLOVE SURG UNDER POLY LF SZ8 (GLOVE) ×2
GOWN STRL REUS W/ TWL LRG LVL3 (GOWN DISPOSABLE) IMPLANT
GOWN STRL REUS W/ TWL XL LVL3 (GOWN DISPOSABLE) ×2 IMPLANT
GOWN STRL REUS W/TWL LRG LVL3 (GOWN DISPOSABLE)
GOWN STRL REUS W/TWL XL LVL3 (GOWN DISPOSABLE) ×2
HANDPIECE INTERPULSE COAX TIP (DISPOSABLE) ×1
IMMOBILIZER KNEE 22 UNIV (SOFTGOODS) ×2 IMPLANT
KIT BASIN OR (CUSTOM PROCEDURE TRAY) ×2 IMPLANT
KIT TURNOVER KIT B (KITS) ×2 IMPLANT
MANIFOLD NEPTUNE II (INSTRUMENTS) ×2 IMPLANT
NEEDLE 18GX1X1/2 (RX/OR ONLY) (NEEDLE) IMPLANT
NS IRRIG 1000ML POUR BTL (IV SOLUTION) ×2 IMPLANT
PACK TOTAL JOINT (CUSTOM PROCEDURE TRAY) ×2 IMPLANT
PAD ARMBOARD 7.5X6 YLW CONV (MISCELLANEOUS) ×2 IMPLANT
PADDING CAST COTTON 6X4 STRL (CAST SUPPLIES) ×2 IMPLANT
PATELLA 32MMX10MM (Knees) ×2 IMPLANT
PIN FLUTED HEDLESS FIX 3.5X1/8 (PIN) ×2 IMPLANT
SET HNDPC FAN SPRY TIP SCT (DISPOSABLE) ×1 IMPLANT
SET PAD KNEE POSITIONER (MISCELLANEOUS) ×2 IMPLANT
STAPLER VISISTAT 35W (STAPLE) IMPLANT
STRIP CLOSURE SKIN 1/2X4 (GAUZE/BANDAGES/DRESSINGS) IMPLANT
SUCTION FRAZIER HANDLE 10FR (MISCELLANEOUS) ×1
SUCTION TUBE FRAZIER 10FR DISP (MISCELLANEOUS) ×1 IMPLANT
SUT MNCRL AB 4-0 PS2 18 (SUTURE) IMPLANT
SUT VIC AB 0 CT1 27 (SUTURE) ×1
SUT VIC AB 0 CT1 27XBRD ANBCTR (SUTURE) ×1 IMPLANT
SUT VIC AB 1 CT1 27 (SUTURE) ×2
SUT VIC AB 1 CT1 27XBRD ANBCTR (SUTURE) ×2 IMPLANT
SUT VIC AB 2-0 CT1 27 (SUTURE) ×2
SUT VIC AB 2-0 CT1 TAPERPNT 27 (SUTURE) ×2 IMPLANT
SYR 50ML LL SCALE MARK (SYRINGE) IMPLANT
TOWEL GREEN STERILE (TOWEL DISPOSABLE) ×2 IMPLANT
TOWEL GREEN STERILE FF (TOWEL DISPOSABLE) ×2 IMPLANT
TRAY CATH 16FR W/PLASTIC CATH (SET/KITS/TRAYS/PACK) IMPLANT
WRAP KNEE MAXI GEL POST OP (GAUZE/BANDAGES/DRESSINGS) ×2 IMPLANT

## 2021-02-15 NOTE — Transfer of Care (Signed)
Immediate Anesthesia Transfer of Care Note  Patient: Katie Weber  Procedure(s) Performed: LEFT TOTAL KNEE ARTHROPLASTY (Left Knee)  Patient Location: PACU  Anesthesia Type:MAC and Spinal  Level of Consciousness: awake, alert  and oriented  Airway & Oxygen Therapy: Patient Spontanous Breathing and Patient connected to face mask oxygen  Post-op Assessment: Report given to RN and Post -op Vital signs reviewed and stable  Post vital signs: Reviewed and stable  Last Vitals:  Vitals Value Taken Time  BP 113/61 02/15/21 1627  Temp    Pulse 58 02/15/21 1628  Resp 16 02/15/21 1628  SpO2 100 % 02/15/21 1628  Vitals shown include unvalidated device data.  Last Pain:  Vitals:   02/15/21 1234  TempSrc:   PainSc: 0-No pain      Patients Stated Pain Goal: 4 (20/25/42 7062)  Complications: No complications documented.

## 2021-02-15 NOTE — H&P (Signed)
TOTAL KNEE ADMISSION H&P  Patient is being admitted for left total knee arthroplasty.  Subjective:  Chief Complaint:left knee pain.  HPI: Katie Weber, 75 y.o. female, has a history of pain and functional disability in the left knee due to arthritis and has failed non-surgical conservative treatments for greater than 12 weeks to includeNSAID's and/or analgesics, corticosteriod injections, flexibility and strengthening excercises, use of assistive devices, weight reduction as appropriate and activity modification.  Onset of symptoms was gradual, starting 5 years ago with gradually worsening course since that time. The patient noted no past surgery on the left knee(s).  Patient currently rates pain in the left knee(s) at 10 out of 10 with activity. Patient has night pain, worsening of pain with activity and weight bearing, pain that interferes with activities of daily living, pain with passive range of motion, crepitus and joint swelling.  Patient has evidence of subchondral sclerosis, periarticular osteophytes and joint space narrowing by imaging studies. There is no active infection.  Patient Active Problem List   Diagnosis Date Noted  . Unilateral primary osteoarthritis, left knee 01/04/2021  . Coronary artery calcification 04/24/2017  . Hyperlipidemia 04/24/2017  . Acute bronchitis 09/29/2014  . SBO (small bowel obstruction) ([place]) 09/27/2014  . Nausea and vomiting 09/27/2014  . Hypothyroidism 09/27/2014  . Leukocytosis 09/27/2014   Past Medical History:  Diagnosis Date  . Anemia    when pt was younger  . Arthritis    arthritis fingers  . Asthma   . Coronary artery calcification 04/24/2017  . Diverticulitis    x2 -last 1'17 tx. outpatient.  . Environmental and seasonal allergies    2014 Possibly related to air freshners.  . 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  . Family history of anesthesia complication  06YIR ago   allergic reaction to atropine  . GERD (gastroesophageal reflux disease)    no meds  . History of hiatal hernia 09/14/2014   seen on xray  . History of kidney stones    '80's lithotripsy-[facility] x1  . History of kidney stones    x1  . Hyperlipidemia   . Hypothyroidism   . Pneumonia 2014  . Pure hypercholesterolemia   . Tremor of left hand     Past Surgical History:  Procedure Laterality Date  . BALLOON DILATION N/A 05/04/2014   Procedure: BALLOON DILATION;  Surgeon: Garlan Fair, MD;  Location: [facility];  Service: Endoscopy;  Laterality: N/A;  . BREAST LUMPECTOMY Right 1972   benign   . BREAST SURGERY Right    '72 -benign tumor  . childbirth- NVD x2     . COLONOSCOPY WITH PROPOFOL N/A 05/04/2014   Procedure: COLONOSCOPY WITH PROPOFOL;  Surgeon: Garlan Fair, MD;  [facility];  Service: Endoscopy;  Laterality: N/A;  . DILATION AND CURETTAGE OF UTERUS     04-20-14 [facility] endometrial polyp removed.  . ESOPHAGOGASTRODUODENOSCOPY (EGD) WITH PROPOFOL N/A 05/04/2014   Procedure: ESOPHAGOGASTRODUODENOSCOPY (EGD) WITH PROPOFOL;  Surgeon: Garlan Fair, MD;  [facility];  Service: Endoscopy;  Laterality: N/A;. Procedure done in [place], [place]- 5 yrs ago.  Marland Kitchen HYSTEROSCOPY WITH D & C N/A 04/20/2014   Procedure: DILATATION AND CURETTAGE /HYSTEROSCOPY;  Surgeon: [name], MD;  Location: [facility];  Service: Gynecology;  Laterality: N/A;  . LITHOTRIPSY    . POLYPECTOMY  2020   Dr. Lear Ng  . TUBAL LIGATION  Current Facility-Administered Medications  Medication Dose Route Frequency Provider Last Rate Last Admin  . [START ON 02/16/2021] clindamycin (CLEOCIN) IVPB 900 mg  900 mg Intravenous On Call to OR Pete Pelt, PA-C      . fentaNYL (SUBLIMAZE) 100 MCG/2ML injection           . lactated ringers infusion   Intravenous Continuous Belinda Block, MD 10 mL/hr at 02/15/21 1246 New Bag at 02/15/21 1246  . midazolam  (VERSED) 2 MG/2ML injection           . povidone-iodine 10 % swab 2 application  2 application Topical Once Pete Pelt, PA-C      . tranexamic acid (CYKLOKAPRON) IVPB 1,000 mg  1,000 mg Intravenous To OR Pete Pelt, PA-C       Allergies  Allergen Reactions  . Contrast Media [Iodinated Diagnostic Agents] Shortness Of Breath and Other (See Comments)    Tightness in chest. Trouble breathing  . Penicillins Hives    .Marland KitchenHas patient had a PCN reaction causing immediate rash, facial/tongue/throat swelling, SOB or lightheadedness with hypotension: No Has patient had a PCN reaction causing severe rash involving mucus membranes or skin necrosis: No Has patient had a PCN reaction that required hospitalization No Has patient had a PCN reaction occurring within the last 10 years: No If all of the above answers are "NO", then may proceed with Cephalosporin use.     Social History   Tobacco Use  . Smoking status: Never Smoker  . Smokeless tobacco: Never Used  Substance Use Topics  . Alcohol use: Yes    Comment: rare social    Family History  Problem Relation Age of Onset  . Cancer Other   . Endometrial cancer Mother   . CAD Father   . Breast cancer Maternal Aunt      Review of Systems  Musculoskeletal: Positive for gait problem and joint swelling.  All other systems reviewed and are negative.   Objective:  Physical Exam Vitals reviewed.  Constitutional:      Appearance: Normal appearance.  HENT:     Head: Normocephalic and atraumatic.  Eyes:     Extraocular Movements: Extraocular movements intact.     Pupils: Pupils are equal, round, and reactive to light.  Cardiovascular:     Rate and Rhythm: Normal rate and regular rhythm.     Pulses: Normal pulses.  Pulmonary:     Effort: Pulmonary effort is normal.  Abdominal:     Palpations: Abdomen is soft.  Musculoskeletal:     Left knee: Effusion, bony tenderness and crepitus present. Decreased range of motion. Tenderness  present over the medial joint line, lateral joint line and patellar tendon. Abnormal alignment and abnormal meniscus.  Neurological:     Mental Status: She is alert and oriented to person, place, and time.  Psychiatric:        Behavior: Behavior normal.     Vital signs in last 24 hours: Temp:  [98.4 F (36.9 C)] 98.4 F (36.9 C) (05/10 1216) Pulse Rate:  [75] 75 (05/10 1216) Resp:  [18] 18 (05/10 1216) BP: (151)/(59) 151/59 (05/10 1216) SpO2:  [98 %] 98 % (05/10 1216) Weight:  [87.1 kg] 87.1 kg (05/10 1216)  Labs:   Estimated body mass index is 36.28 kg/m as calculated from the following:   Height as of this encounter: 5\' 1"  (1.549 m).   Weight as of this encounter: 87.1 kg.   Imaging Review Plain radiographs demonstrate severe degenerative joint  disease of the left knee(s). The overall alignment ismild varus. The bone quality appears to be good for age and reported activity level.      Assessment/Plan:  End stage arthritis, left knee   The patient history, physical examination, clinical judgment of the provider and imaging studies are consistent with end stage degenerative joint disease of the left knee(s) and total knee arthroplasty is deemed medically necessary. The treatment options including medical management, injection therapy arthroscopy and arthroplasty were discussed at length. The risks and benefits of total knee arthroplasty were presented and reviewed. The risks due to aseptic loosening, infection, stiffness, patella tracking problems, thromboembolic complications and other imponderables were discussed. The patient acknowledged the explanation, agreed to proceed with the plan and consent was signed. Patient is being admitted for inpatient treatment for surgery, pain control, PT, OT, prophylactic antibiotics, VTE prophylaxis, progressive ambulation and ADL's and discharge planning. The patient is planning to be discharged home with home health services

## 2021-02-15 NOTE — Brief Op Note (Signed)
02/15/2021  3:55 PM  PATIENT:  Katie Weber  75 y.o. female  PRE-OPERATIVE DIAGNOSIS:  osteoarthritis left knee  POST-OPERATIVE DIAGNOSIS:  osteoarthritis left knee  PROCEDURE:  Procedure(s): LEFT TOTAL KNEE ARTHROPLASTY (Left)  SURGEON:  Surgeon(s) and Role:    Mcarthur Rossetti, MD - Primary  PHYSICIAN ASSISTANT:  Benita Stabile, PA-C  ANESTHESIA:   regional and spinal  EBL:  50 mL   COUNTS:  YES  TOURNIQUET:   Total Tourniquet Time Documented: Thigh (Left) - 52 minutes Total: Thigh (Left) - 52 minutes   DICTATION: .Other Dictation: Dictation Number [id number]  PLAN OF CARE: Admit for overnight observation  PATIENT DISPOSITION:  PACU - hemodynamically stable.   Delay start of Pharmacological VTE agent (>24hrs) due to surgical blood loss or risk of bleeding: no

## 2021-02-15 NOTE — Anesthesia Procedure Notes (Signed)
Anesthesia Regional Block: Adductor canal block   Pre-Anesthetic Checklist: ,, timeout performed, Correct Patient, Correct Site, Correct Laterality, Correct Procedure, Correct Position, site marked, Risks and benefits discussed,  Surgical consent,  Pre-op evaluation,  At surgeon's request and post-op pain management  Laterality: Left and Lower  Prep: chloraprep       Needles:  Injection technique: Single-shot     Needle Length: 9cm  Needle Gauge: 22     Additional Needles: Arrow StimuQuik ECHO Echogenic Stimulating PNB Needle  Procedures:,,,, ultrasound used (permanent image in chart),,,,  Narrative:  Start time: 02/15/2021 1:58 PM End time: 02/15/2021 2:02 PM Injection made incrementally with aspirations every 5 mL.  Performed by: Personally  Anesthesiologist: Oleta Mouse, MD

## 2021-02-15 NOTE — Anesthesia Procedure Notes (Signed)
Procedure Name: MAC Date/Time: 02/15/2021 2:25 PM Performed by: Dorthea Cove, CRNA Pre-anesthesia Checklist: Patient identified, Emergency Drugs available, Suction available, Patient being monitored and Timeout performed Patient Re-evaluated:Patient Re-evaluated prior to induction Oxygen Delivery Method: Nasal cannula Preoxygenation: Pre-oxygenation with 100% oxygen Induction Type: IV induction Placement Confirmation: CO2 detector Dental Injury: Teeth and Oropharynx as per pre-operative assessment

## 2021-02-15 NOTE — Plan of Care (Signed)

## 2021-02-15 NOTE — Anesthesia Procedure Notes (Signed)
Spinal  Patient location during procedure: OR Start time: 02/15/2021 2:21 PM End time: 02/15/2021 2:26 PM Reason for block: surgical anesthesia Staffing Performed: anesthesiologist  Anesthesiologist: Oleta Mouse, MD Preanesthetic Checklist Completed: patient identified, IV checked, risks and benefits discussed, surgical consent, monitors and equipment checked, pre-op evaluation and timeout performed Spinal Block Patient position: sitting Prep: DuraPrep Patient monitoring: heart rate, cardiac monitor, continuous pulse ox and blood pressure Approach: midline Location: L4-5 Injection technique: single-shot Needle Needle type: Pencan  Needle gauge: 24 G Needle length: 9 cm Assessment Sensory level: T6 Events: CSF return

## 2021-02-15 NOTE — Anesthesia Preprocedure Evaluation (Addendum)
Anesthesia Evaluation  Patient identified by MRN, date of birth, ID band Patient awake    Reviewed: Allergy & Precautions, NPO status , Patient's Chart, lab work & pertinent test results  History of Anesthesia Complications Negative for: history of anesthetic complications  Airway Mallampati: II  TM Distance: >3 FB Neck ROM: Full    Dental  (+) Dental Advisory Given   Pulmonary neg shortness of breath, asthma , neg sleep apnea, neg COPD, neg recent URI,  Covid-19 Nucleic Acid Test Results Lab Results      Component                Value               Date                      SARSCOV2NAA              NEGATIVE            02/11/2021              breath sounds clear to auscultation       Cardiovascular (-) hypertension(-) angina(-) Past MI and (-) CHF  Rhythm:Regular     Neuro/Psych negative neurological ROS  negative psych ROS   GI/Hepatic Neg liver ROS, hiatal hernia, GERD  ,  Endo/Other  Hypothyroidism No results found for: HGBA1C   Renal/GU negative Renal ROSLab Results      Component                Value               Date                      CREATININE               0.83                02/11/2021                Musculoskeletal  (+) Arthritis ,   Abdominal   Peds  Hematology negative hematology ROS (+) Lab Results      Component                Value               Date                      WBC                      6.5                 02/11/2021                HGB                      13.2                02/11/2021                HCT                      41.5                02/11/2021                MCV  95.4                02/11/2021                PLT                      285                 02/11/2021            Denies blood thinners   Anesthesia Other Findings   Reproductive/Obstetrics                            Anesthesia Physical Anesthesia Plan  ASA:  II  Anesthesia Plan: MAC, Regional and Spinal   Post-op Pain Management:  Regional for Post-op pain   Induction: Intravenous  PONV Risk Score and Plan: 2 and Propofol infusion and Treatment may vary due to age or medical condition  Airway Management Planned: Nasal Cannula  Additional Equipment: None  Intra-op Plan:   Post-operative Plan:   Informed Consent: I have reviewed the patients History and Physical, chart, labs and discussed the procedure including the risks, benefits and alternatives for the proposed anesthesia with the patient or authorized representative who has indicated his/her understanding and acceptance.     Dental advisory given  Plan Discussed with: CRNA and Surgeon  Anesthesia Plan Comments:         Anesthesia Quick Evaluation

## 2021-02-16 LAB — BASIC METABOLIC PANEL
Anion gap: 5 (ref 5–15)
BUN: 11 mg/dL (ref 8–23)
CO2: 25 mmol/L (ref 22–32)
Calcium: 8.5 mg/dL — ABNORMAL LOW (ref 8.9–10.3)
Chloride: 107 mmol/L (ref 98–111)
Creatinine, Ser: 0.91 mg/dL (ref 0.44–1.00)
GFR, Estimated: >60 mL/min (ref >60)
Glucose, Bld: 137 mg/dL — ABNORMAL HIGH (ref 70–99)
Potassium: 4.7 mmol/L (ref 3.5–5.1)
Sodium: 137 mmol/L (ref 135–145)

## 2021-02-16 LAB — CBC
HCT: 36.7 % (ref 36.0–46.0)
Hemoglobin: 11.8 g/dL — ABNORMAL LOW (ref 12.0–15.0)
MCH: 30.6 pg (ref 26.0–34.0)
MCHC: 32.2 g/dL (ref 30.0–36.0)
MCV: 95.1 fL (ref 80.0–100.0)
Platelets: 272 10*3/uL (ref 150–400)
RBC: 3.86 MIL/uL — ABNORMAL LOW (ref 3.87–5.11)
RDW: 13.3 % (ref 11.5–15.5)
WBC: 13.4 10*3/uL — ABNORMAL HIGH (ref 4.0–10.5)
nRBC: 0 % (ref 0.0–0.2)

## 2021-02-16 MED ORDER — METHOCARBAMOL 500 MG PO TABS: 500.0000 mg | ORAL_TABLET | Freq: Four times a day (QID) | ORAL | 1 refills | Status: DC | PRN

## 2021-02-16 MED ORDER — ASPIRIN 81 MG PO CHEW: 81.0000 mg | CHEWABLE_TABLET | Freq: Two times a day (BID) | ORAL | 0 refills | Status: DC

## 2021-02-16 MED ORDER — OXYCODONE HCL 5 MG PO TABS: 5.0000 mg | ORAL_TABLET | ORAL | 0 refills | Status: DC | PRN

## 2021-02-16 NOTE — Op Note (Signed)
Katie Weber, Katie Weber MEDICAL RECORD NO: [id number] ACCOUNT NO: [id number] DATE OF BIRTH: Nov 26, 1945 FACILITY: [facility] LOCATION: [facility] PHYSICIAN: Lind Guest. [name], MD  Operative Report   DATE OF PROCEDURE: 02/15/2021  PREOPERATIVE DIAGNOSIS:  Primary osteoarthritis and degenerative joint disease, left knee.  POSTOPERATIVE DIAGNOSIS:  Primary osteoarthritis and degenerative joint disease, left knee.  PROCEDURE:  Left total knee arthroplasty.  IMPLANTS:  Stryker Triathlon cemented knee system with size 4 femur, size 3 tibial tray, 11 mm fixed bearing polyethylene insert, size 32 patellar button.  SURGEON:  Jean Rosenthal, M.D.  ASSISTANT:  Erskine Emery, PA-C   ANESTHESIA:   1.  Left lower extremity adductor canal block. 2.  Spinal.  ESTIMATED BLOOD LOSS:  50 mL  TOURNIQUET TIME:  Under one hour.  COMPLICATIONS:  None.  INDICATIONS:  The patient is a 75 year old female well known to me.  She has debilitating arthritis involving both of her knees.  It has been going on for many years now.  She has had multiple steroid injections in those knees.  She has worked on  activity modification and weight loss as well as had to use assistive device to walk and has had therapy for her knees.  At this point, her knee pain is daily and is detrimentally affecting her mobility, her activities of daily living and her quality of  life.  She does wish to proceed with total knee arthroplasty at this point.  We talked about the risk of acute blood loss anemia, nerve or vessel injury, fracture, infection, DVT, and implant failure.  We talked about the goals being decreased pain,  improve mobility and overall improve quality of life.  DESCRIPTION OF PROCEDURE:  After informed consent was obtained, appropriate left knee was marked.  An adductor canal block was obtained in the left lower extremity in the holding room.  She was then brought to the operating room and sat up on the   operating table where spinal anesthesia was obtained.  She was laid in supine position on the operating table.  Foley catheter was placed and a nonsterile tourniquet was placed around her upper left thigh.  Her left thigh, knee, leg, ankle and foot were  prepped and draped with ChloraPrep and sterile drapes.  A timeout was called and she was identified correct patient, correct left knee.  We then used Esmarch to wrap that leg and tourniquet was inflated to 300 mm of pressure.  I then made a direct  midline incision over the patella and carried this proximally and distally, dissected down the knee joint, carried out a medial parapatellar arthrotomy, finding very large joint effusion and significant synovitis in the knee.  I removed the superior  synovium from the knee and the ACL, PCL, medial and lateral meniscus.  We found complete loss of cartilage throughout the knee.  With the knee in a flexed position, we used the extramedullary cutting guide to take 9 mm off the high side,  correcting varus and valgus and neutral slope.  We made this cut without difficulty.  We then went to the femur and used our intramedullary guide for the femur setting this for a left knee at 5 degrees externally rotated for a 10 mm distal femoral cut.   We made this cut without difficulty, and brought the knee back down to full extension and with a 9 mm extension block, she is slightly hyperextended.  We went back to the femur and put our sizing guide based off the epicondylar axis and  Whitesides line.   Based off of this, we chose a size 4 femur.  We put a 4-in-1 cutting block for a size 4 femur, made our anterior and posterior cuts, followed by our chamfer cuts.  We then made a femoral box cut for a size 4 femur.  Attention was then turned back to the  tibia.  We chose a size 3 tibial tray for coverage on the tibial plateau setting the rotation of the tibial tubercle and the femur.  Based off of this, we did our keel punch for a  size 3 tibial tray with a size 3 trial in place, we trialed a 4 left  femur and a 9 mm and then 11 mm polyethylene trial.  We were pleased with stability with 11 mm trial throughout its flexion and extension .  We then made a patellar cut and drilled three holes for a size 32 patellar button.  With all trial  instrumentation in the knee, we put her through several cycles of motion.  We were pleased with range of motion and stability.  We then removed all instrumentation from the knee and irrigated the knee with normal saline solution using pulsatile lavage.   We dried the knee real well and then mixed our cement on the back table.  We then put the knee in a flexed position and cemented our real Stryker Triathlon tibial tray, size 3, followed by cementing a real size 4 left femur.  Once we had removed cement  debris from the knee we placed our 11 mm fixed bearing polyethylene insert and cemented our size 32 patellar button.  We held the knee fully extended and compressed the knee as the cement hardened and cured.  Once that is done so we let the tourniquet  down hemostasis obtained with electrocautery.  We closed the arthrotomy with interrupted #1 Vicryl suture followed by 0 Vicryl to close deep tissue and 2-0 Vicryl to close the subcutaneous tissue.  The skin was closed with staples.  A well-padded sterile  dressing was applied.  She was taken off the operating table and taken to recovery room in stable condition.  All final counts being correct.  No complications noted.  Of note, Benita Stabile, PA-C did assist during the entire case and assistance was crucial  from beginning of the case to the end in facilitating all aspects of the case.   SUJ D: 02/15/2021 3:53:58 pm T: 02/16/2021 3:18:00 am  JOB: [id number]/ [id number]

## 2021-02-16 NOTE — Discharge Instructions (Signed)

## 2021-02-16 NOTE — Progress Notes (Signed)
Foley removed at this time per order.

## 2021-02-16 NOTE — Progress Notes (Signed)
Subjective: 1 Day Post-Op Procedure(s) (LRB): LEFT TOTAL KNEE ARTHROPLASTY (Left) Patient reports pain as moderate.  No complaints this morning.   Objective: Vital signs in last 24 hours: Temp:  [97.8 F (36.6 C)-100 F (37.8 C)] 99 F (37.2 C) (05/11 0322) Pulse Rate:  [55-76] 76 (05/11 0322) Resp:  [10-20] 18 (05/11 0322) BP: (94-151)/(49-101) 108/65 (05/11 0322) SpO2:  [93 %-100 %] 93 % (05/11 0322) Weight:  [87.1 kg] 87.1 kg (05/10 1216)  Intake/Output from previous day: 05/10 0701 - 05/11 0700 In: 1096.1 [I.V.:1096.1] Out: 1325 [Urine:1275; Blood:50] Intake/Output this shift: No intake/output data recorded.  Recent Labs    02/16/21 0306  HGB 11.8*   Recent Labs    02/16/21 0306  WBC 13.4*  RBC 3.86*  HCT 36.7  PLT 272   Recent Labs    02/16/21 0306  NA 137  K 4.7  CL 107  CO2 25  BUN 11  CREATININE 0.91  GLUCOSE 137*  CALCIUM 8.5*   No results for input(s): LABPT, INR in the last 72 hours.  Left lower extremity: Sensation intact distally Intact pulses distally Dorsiflexion/Plantar flexion intact Incision: dressing C/D/I Compartment soft   Assessment/Plan: 1 Day Post-Op Procedure(s) (LRB): LEFT TOTAL KNEE ARTHROPLASTY (Left) Up with therapy Plan discharge to home later today or tomorrow depending on patients progress with PT.      Katie Weber 02/16/2021, 8:03 AM

## 2021-02-16 NOTE — Evaluation (Signed)
Physical Therapy Evaluation Patient Details Name: Katie Weber MRN: [id number] DOB: 03/31/1946 Today's Date: 02/16/2021   History of Present Illness  Pt is a 75yo female admitted for elective L TKA. PMH: diverticulitis, GERD, hiatal hernia PSH: break surgery  Clinical Impression  Pt is s/p TKA resulting in the deficits listed below (see PT Problem List). Pt tolerated OOB mobility well for first time up. Pt diaphoretic with ambulation. PT to see again later today to assess stair negotiation. Pt will benefit from skilled PT to increase their independence and safety with mobility to allow discharge to the venue listed below.      Follow Up Recommendations Home health PT;Supervision/Assistance - 24 hour;Follow surgeon's recommendation for DC plan and follow-up therapies    Equipment Recommendations  Rolling walker with 5" wheels    Recommendations for Other Services       Precautions / Restrictions Precautions Precautions: Knee Precaution Booklet Issued: Yes (comment) Precaution Comments: pt educated on precautions, given handout Restrictions Weight Bearing Restrictions: Yes LLE Weight Bearing: Weight bearing as tolerated      Mobility  Bed Mobility Overal bed mobility: Needs Assistance Bed Mobility: Supine to Sit     Supine to sit: Min guard     General bed mobility comments: verbal cues for long sit technique and then to complete L quad set prior to abducting LE off EOB    Transfers Overall transfer level: Needs assistance Equipment used: Rolling walker (2 wheeled) Transfers: Sit to/from Stand Sit to Stand: Min guard         General transfer comment: verbal cues for safe hand placement, increased time, significant trunk flexion  Ambulation/Gait Ambulation/Gait assistance: Min guard Gait Distance (Feet): 120 Feet Assistive device: Rolling walker (2 wheeled) Gait Pattern/deviations: Step-through pattern;Trunk flexed;Decreased stride length;Antalgic Gait  velocity: slow Gait velocity interpretation: <1.31 ft/sec, indicative of household ambulator General Gait Details: initially step to gait pattern, with verbal cues and initially minA to keep propelling RW forward pt able to transition to step through, no knee buckling, many standing rest breaks, pt sweaty especially at hands, pt encouraged to increased bilat LE WBing and decreased UE WBing  Stairs            Wheelchair Mobility    Modified Rankin (Stroke Patients Only)       Balance Overall balance assessment: Needs assistance Sitting-balance support: Feet supported;No upper extremity supported Sitting balance-Leahy Scale: Good     Standing balance support: No upper extremity supported;During functional activity Standing balance-Leahy Scale: Fair Standing balance comment: pt able to stand and pull up underwear                             Pertinent Vitals/Pain Pain Assessment: 0-10 Pain Score: 8  Pain Location: L knee Pain Descriptors / Indicators: Discomfort;Sore Pain Intervention(s): Premedicated before session    [place] expects to be discharged to:: Private residence Living Arrangements: Spouse/significant other Available Help at Discharge: Family;Available 24 hours/day Type of Home: House Home Access: Stairs to enter Entrance Stairs-Rails: Right;Left;Can reach both Entrance Stairs-Number of Steps: 2 Home Layout: Two level;Able to live on main level with bedroom/bathroom Home Equipment: Tub bench;Walker - 2 wheels;Toilet riser      Prior Function Level of Independence: Independent         Comments: retired     Engineer, manufacturing [employer]   Dominant Hand: Right    Extremity/Trunk Assessment   Upper Extremity Assessment Upper Extremity Assessment: Overall  WFL for tasks assessed    Lower Extremity Assessment Lower Extremity Assessment: LLE deficits/detail LLE Deficits / Details: able to complete quad set, initiate knee flexion     Cervical / Trunk Assessment Cervical / Trunk Assessment: Normal  Communication   Communication: No difficulties  Cognition Arousal/Alertness: Awake/alert Behavior During Therapy: WFL for tasks assessed/performed Overall Cognitive Status: Within Functional Limits for tasks assessed                                        General Comments General comments (skin integrity, edema, etc.): pt with expected swelling at L knee, pt sweaty s/p ambulation - give cold wash cloth    Exercises Total Joint Exercises Ankle Circles/Pumps: AROM;Both;10 reps;Supine Quad Sets: AROM;Left;10 reps;Supine Heel Slides: AROM;AAROM;Left;10 reps;Seated (with wash cloth under L foot to slide on floor) Goniometric ROM: -7-86 deg in sitting   Assessment/Plan    PT Assessment Patient needs continued PT services  PT Problem List Decreased strength;Decreased range of motion;Decreased activity tolerance;Decreased balance;Decreased mobility       PT Treatment Interventions DME instruction;Gait training;Stair training;Functional mobility training;Therapeutic activities;Therapeutic exercise;Balance training;Neuromuscular re-education;Cognitive remediation    PT Goals (Current goals can be found in the Care Plan section)  Acute Rehab PT Goals Patient Stated Goal: get better PT Goal Formulation: With patient Time For Goal Achievement: 03/02/21 Potential to Achieve Goals: Good    Frequency 7X/week   Barriers to discharge        Co-evaluation               AM-PAC PT "6 Clicks" Mobility  Outcome Measure Help needed turning from your back to your side while in a flat bed without using bedrails?: None Help needed moving from lying on your back to sitting on the side of a flat bed without using bedrails?: None Help needed moving to and from a bed to a chair (including a wheelchair)?: None Help needed standing up from a chair using your arms (e.g., wheelchair or bedside chair)?: None Help  needed to walk in hospital room?: A Little Help needed climbing 3-5 steps with a railing? : A Little 6 Click Score: 22    End of Session Equipment Utilized During Treatment: Gait belt Activity Tolerance: Patient tolerated treatment well Patient left: in chair;with call bell/phone within reach Nurse Communication: Mobility status PT Visit Diagnosis: Unsteadiness on feet (R26.81);Difficulty in walking, not elsewhere classified (R26.2)    Time: 8416-6063 PT Time Calculation (min) (ACUTE ONLY): 35 min   Charges:   PT Evaluation $PT Eval Moderate Complexity: 1 Mod PT Treatments $Gait Training: 8-22 mins        Kittie Plater, PT, DPT Acute Rehabilitation Services Pager #: [phone] Office #: [phone]   Berline Lopes 02/16/2021, 10:37 AM

## 2021-02-17 ENCOUNTER — Encounter (HOSPITAL_COMMUNITY): Payer: Self-pay | Admitting: Orthopaedic Surgery

## 2021-02-17 DIAGNOSIS — J45909 Unspecified asthma, uncomplicated: Secondary | ICD-10-CM | POA: Diagnosis present

## 2021-02-17 DIAGNOSIS — Z8701 Personal history of pneumonia (recurrent): Secondary | ICD-10-CM | POA: Diagnosis not present

## 2021-02-17 DIAGNOSIS — M170 Bilateral primary osteoarthritis of knee: Secondary | ICD-10-CM | POA: Diagnosis present

## 2021-02-17 DIAGNOSIS — Z9851 Tubal ligation status: Secondary | ICD-10-CM | POA: Diagnosis not present

## 2021-02-17 DIAGNOSIS — E785 Hyperlipidemia, unspecified: Secondary | ICD-10-CM | POA: Diagnosis present

## 2021-02-17 DIAGNOSIS — Z8249 Family history of ischemic heart disease and other diseases of the circulatory system: Secondary | ICD-10-CM | POA: Diagnosis not present

## 2021-02-17 DIAGNOSIS — E039 Hypothyroidism, unspecified: Secondary | ICD-10-CM | POA: Diagnosis present

## 2021-02-17 DIAGNOSIS — Z87442 Personal history of urinary calculi: Secondary | ICD-10-CM | POA: Diagnosis not present

## 2021-02-17 DIAGNOSIS — M659 Synovitis and tenosynovitis, unspecified: Secondary | ICD-10-CM | POA: Diagnosis present

## 2021-02-17 DIAGNOSIS — Z91041 Radiographic dye allergy status: Secondary | ICD-10-CM | POA: Diagnosis not present

## 2021-02-17 DIAGNOSIS — M19049 Primary osteoarthritis, unspecified hand: Secondary | ICD-10-CM | POA: Diagnosis present

## 2021-02-17 DIAGNOSIS — I2510 Atherosclerotic heart disease of native coronary artery without angina pectoris: Secondary | ICD-10-CM | POA: Diagnosis present

## 2021-02-17 DIAGNOSIS — Z880 Allergy status to penicillin: Secondary | ICD-10-CM | POA: Diagnosis not present

## 2021-02-17 DIAGNOSIS — M25462 Effusion, left knee: Secondary | ICD-10-CM | POA: Diagnosis present

## 2021-02-17 NOTE — Progress Notes (Signed)
Patient discharged home with home health. Discharge instructions given. All concerns addressed, 3 in 1 sent with patient.

## 2021-02-17 NOTE — Progress Notes (Signed)
Physical Therapy Treatment Patient Details Name: Katie Weber MRN: [id number] DOB: 11/12/1945 Today's Date: 02/17/2021    History of Present Illness Pt is a 75yo female admitted for elective L TKA. PMH: diverticulitis, GERD, hiatal hernia PSH: break surgery    PT Comments    Pt supine in bed on arrival.  Pt very nervous of how she is doing and where she should be in her recovery process.  Pt tolerated exercises, increased gt and performed stairs.  She is ready to d/c home from a mobility standpoint.  Pt is just a little anxious which is understandable.     Follow Up Recommendations  Home health PT;Supervision/Assistance - 24 hour;Follow surgeon's recommendation for DC plan and follow-up therapies     Equipment Recommendations  Rolling walker with 5" wheels    Recommendations for Other Services       Precautions / Restrictions Precautions Precautions: Knee Precaution Booklet Issued: Yes (comment) Precaution Comments: pt educated on precautions, given handout Restrictions LLE Weight Bearing: Weight bearing as tolerated    Mobility  Bed Mobility Overal bed mobility: Needs Assistance Bed Mobility: Supine to Sit     Supine to sit: Supervision     General bed mobility comments: Pt utilized hooking method to move LLE to edge of bed.  Increased time and effort but no physical assistance needed.    Transfers Overall transfer level: Needs assistance Equipment used: Rolling walker (2 wheeled) Transfers: Sit to/from Stand Sit to Stand: Supervision         General transfer comment: VCs to push from seated surface.  Ambulation/Gait Ambulation/Gait assistance: Supervision Gait Distance (Feet): 120 Feet Assistive device: Rolling walker (2 wheeled) Gait Pattern/deviations: Step-through pattern;Trunk flexed;Decreased stride length;Antalgic;Decreased stance time - left     General Gait Details: Decreased knee flexion on LLE with swing phase.  Cues for L toe  off.   Stairs Stairs: Yes Stairs assistance: Supervision Stair Management: Step to pattern Number of Stairs: 2 General stair comments: Cues for sequencing and use of hand rail for support.   Wheelchair Mobility    Modified Rankin (Stroke Patients Only)       Balance Overall balance assessment: Needs assistance Sitting-balance support: Feet supported;No upper extremity supported Sitting balance-Leahy Scale: Good     Standing balance support: No upper extremity supported;During functional activity Standing balance-Leahy Scale: Fair Standing balance comment: able to static stand without UE support.                            Cognition Arousal/Alertness: Awake/alert Behavior During Therapy: WFL for tasks assessed/performed Overall Cognitive Status: Within Functional Limits for tasks assessed                                        Exercises Total Joint Exercises Ankle Circles/Pumps: AROM;Both;Supine;20 reps Quad Sets: AROM;Left;10 reps;Supine Short Arc Quad: AAROM;Left;10 reps;Supine Heel Slides: AAROM;Left;10 reps;Supine Hip ABduction/ADduction: AAROM;Left;10 reps;Supine Straight Leg Raises: AAROM;Left;10 reps;Supine Goniometric ROM: 0-74 L knee. General Exercises - Lower Extremity Ankle Circles/Pumps: AROM    General Comments        Pertinent Vitals/Pain Pain Assessment: 0-10 Pain Location: L knee Pain Descriptors / Indicators: Discomfort;Sore Pain Intervention(s): Monitored during session;Repositioned    Home Living                      Prior Function  PT Goals (current goals can now be found in the care plan section) Acute Rehab PT Goals Patient Stated Goal: get better Potential to Achieve Goals: Good Progress towards PT goals: Progressing toward goals    Frequency    7X/week      PT Plan Current plan remains appropriate    Co-evaluation              AM-PAC PT "6 Clicks" Mobility    Outcome Measure  Help needed turning from your back to your side while in a flat bed without using bedrails?: None Help needed moving from lying on your back to sitting on the side of a flat bed without using bedrails?: None Help needed moving to and from a bed to a chair (including a wheelchair)?: None Help needed standing up from a chair using your arms (e.g., wheelchair or bedside chair)?: None Help needed to walk in hospital room?: None Help needed climbing 3-5 steps with a railing? : A Little 6 Click Score: 23    End of Session Equipment Utilized During Treatment: Gait belt Activity Tolerance: Patient tolerated treatment well Patient left: in chair;with call bell/phone within reach Nurse Communication: Mobility status PT Visit Diagnosis: Unsteadiness on feet (R26.81);Difficulty in walking, not elsewhere classified (R26.2)     Time: 1610-9604 PT Time Calculation (min) (ACUTE ONLY): 28 min  Charges:  $Gait Training: 8-22 mins $Therapeutic Exercise: 8-22 mins                     Erasmo Leventhal , PTA Acute Rehabilitation Services Pager [phone] Office [place] 02/17/2021, 11:59 AM

## 2021-02-17 NOTE — Discharge Summary (Signed)
Patient ID: Katie Weber MRN: [id number] DOB/AGE: 1946-03-08 75 y.o.  Admit date: 02/15/2021 Discharge date: 02/17/2021  Admission Diagnoses:  Principal Problem:   Unilateral primary osteoarthritis, left knee Active Problems:   Status post total left knee replacement   Discharge Diagnoses:  Same  Past Medical History:  Diagnosis Date  . Anemia    when pt was younger  . Arthritis    arthritis fingers  . Asthma   . Coronary artery calcification 04/24/2017  . Diverticulitis    x2 -last 1'17 tx. outpatient.  . Environmental and seasonal allergies    2014 Possibly related to air freshners.  . 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  . Family history of anesthesia complication 93XTK ago   allergic reaction to atropine  . GERD (gastroesophageal reflux disease)    no meds  . History of hiatal hernia 09/14/2014   seen on xray  . History of kidney stones    '80's lithotripsy-[facility] x1  . History of kidney stones    x1  . Hyperlipidemia   . Hypothyroidism   . Pneumonia 2014  . Pure hypercholesterolemia   . Tremor of left hand     Surgeries: Procedure(s): LEFT TOTAL KNEE ARTHROPLASTY on 02/15/2021   Consultants:   Discharged Condition: Improved  Hospital Course: Katie Weber is an 75 y.o. female who was admitted 02/15/2021 for operative treatment ofUnilateral primary osteoarthritis, left knee. Patient has severe unremitting pain that affects sleep, daily activities, and work/hobbies. After pre-op clearance the patient was taken to the operating room on 02/15/2021 and underwent  Procedure(s): LEFT TOTAL KNEE ARTHROPLASTY.    Patient was given perioperative antibiotics:  Anti-infectives (From admission, onward)   Start     Dose/Rate Route Frequency Ordered Stop   02/16/21 0600  clindamycin (CLEOCIN) IVPB 900 mg        900 mg 100 mL/hr over 30 Minutes Intravenous On call to O.R. 02/15/21 1208  02/15/21 1432   02/15/21 1630  clindamycin (CLEOCIN) IVPB 600 mg        600 mg 100 mL/hr over 30 Minutes Intravenous Every 6 hours 02/15/21 1621 02/16/21 0429       Patient was given sequential compression devices, early ambulation, and chemoprophylaxis to prevent DVT.  Patient benefited maximally from hospital stay and there were no complications.    Recent vital signs:  Patient Vitals for the past 24 hrs:  BP Temp Temp src Pulse Resp SpO2  02/17/21 0355 124/70 99.3 F (37.4 C) Oral 88 17 (!) 84 %  02/16/21 2054 113/88 98.3 F (36.8 C) Oral 96 16 95 %  02/16/21 1538 125/67 97.6 F (36.4 C) Oral 86 17 90 %  02/16/21 0808 111/72 99.2 F (37.3 C) Oral 88 18 96 %     Recent laboratory studies:  Recent Labs    02/16/21 0306  WBC 13.4*  HGB 11.8*  HCT 36.7  PLT 272  NA 137  K 4.7  CL 107  CO2 25  BUN 11  CREATININE 0.91  GLUCOSE 137*  CALCIUM 8.5*     Discharge Medications:   Allergies as of 02/17/2021      Reactions   Contrast Media [iodinated Diagnostic Agents] Shortness Of Breath, Other (See Comments)   Tightness in chest. Trouble breathing   Penicillins Hives   .Marland KitchenHas patient had a PCN reaction causing immediate rash, facial/tongue/throat swelling, SOB or lightheadedness with hypotension: No Has patient had a  PCN reaction causing severe rash involving mucus membranes or skin necrosis: No Has patient had a PCN reaction that required hospitalization No Has patient had a PCN reaction occurring within the last 10 years: No If all of the above answers are "NO", then may proceed with Cephalosporin use.      Medication List    TAKE these medications   alendronate 70 MG tablet Commonly known as: FOSAMAX Take 70 mg by mouth every Tuesday. Take with a full glass of water on an empty stomach.   aspirin 81 MG chewable tablet Chew 1 tablet (81 mg total) by mouth 2 (two) times daily.   cholecalciferol 25 MCG (1000 UNIT) tablet Commonly known as: VITAMIN D3 Take  1,000 Units by mouth 4 (four) times a week.   levothyroxine 100 MCG tablet Commonly known as: SYNTHROID Take 100 mcg by mouth daily before breakfast.   methocarbamol 500 MG tablet Commonly known as: ROBAXIN Take 1 tablet (500 mg total) by mouth every 6 (six) hours as needed for muscle spasms.   Multivitamin Adult Chew Chew 1 each by mouth 4 (four) times a week.   oxyCODONE 5 MG immediate release tablet Commonly known as: Oxy IR/ROXICODONE Take 1-2 tablets (5-10 mg total) by mouth every 4 (four) hours as needed for moderate pain (pain score 4-6).   rosuvastatin 10 MG tablet Commonly known as: CRESTOR Take 10 mg by mouth 3 (three) times a week.   SALONPAS PAIN RELIEF PATCH EX Apply topically.   vitamin B-12 1000 MCG tablet Commonly known as: CYANOCOBALAMIN Take 1,000 mcg by mouth 4 (four) times a week.   vitamin C 500 MG tablet Commonly known as: ASCORBIC ACID Take 500 mg by mouth 4 (four) times a week.   zinc gluconate 50 MG tablet Take 50 mg by mouth daily.            Durable Medical Equipment  (From admission, onward)         Start     Ordered   02/15/21 1745  DME 3 n 1  Once        02/15/21 1744   02/15/21 1745  DME Walker rolling  Once       Question Answer Comment  Walker: With 5 Inch Wheels   Patient needs a walker to treat with the following condition Status post total left knee replacement      02/15/21 1744          Diagnostic Studies: DG Knee Left Port  Result Date: 02/15/2021 CLINICAL DATA:  Postop. EXAM: PORTABLE LEFT KNEE - 1-2 VIEW COMPARISON:  None. FINDINGS: Left knee arthroplasty in expected alignment. No periprosthetic lucency or fracture. There has been patellar resurfacing. Recent postsurgical change includes air and edema in the soft tissues and joint space. Anterior skin staples. IMPRESSION: Left knee arthroplasty without immediate postoperative complication. Electronically Signed   By: Keith Rake M.D.   On: 02/15/2021 17:11     Disposition: Discharge disposition: 01-Home or [place]    Mcarthur Rossetti, MD Follow up in 2 week(s).   Specialty: Orthopedic Surgery Contact information: [address] [place] [address] [phone]                Signed: Mcarthur Rossetti 02/17/2021, 7:22 AM

## 2021-02-17 NOTE — Progress Notes (Signed)
Late entry for missed note by Kittie Plater, PT.  Katie Weber documented in flowsheets but did not pull information into a progress note.     02/16/21 1400  PT Visit Information  Last PT Received On 02/16/21  Assistance Needed +1  History of Present Illness Pt is a 75yo female admitted for elective L TKA. PMH: diverticulitis, GERD, hiatal hernia PSH: break surgery  Subjective Data  Patient Stated Goal get better  Precautions  Precautions Knee  Precaution Booklet Issued Yes (comment)  Precaution Comments pt educated on precautions, given handout  Restrictions  Weight Bearing Restrictions Yes  LLE Weight Bearing WBAT  Pain Assessment  Pain Assessment 0-10  Pain Score 7  Pain Location L knee  Pain Descriptors / Indicators Discomfort;Sore  Pain Intervention(s) Premedicated before session  Cognition  Arousal/Alertness Awake/alert  Behavior During Therapy WFL for tasks assessed/performed  Overall Cognitive Status Within Functional Limits for tasks assessed  Bed Mobility  Overal bed mobility Needs Assistance  Bed Mobility Supine to Sit;Sit to Supine  Supine to sit Min guard  Sit to supine Min assist  General bed mobility comments verbal cues for long sit technique and then to complete L quad set prior to abducting LE off EOB, minA for L LE management back into bed  Transfers  Overall transfer level Needs assistance  Equipment used Rolling walker (2 wheeled)  Transfers Sit to/from Stand  Sit to Stand Min guard  General transfer comment verbal cues for safe hand placement, increased time, significant trunk flexion  Ambulation/Gait  Ambulation/Gait assistance Min guard  Gait Distance (Feet) 100 Feet  Assistive device Rolling walker (2 wheeled)  Gait Pattern/deviations Step-through pattern;Trunk flexed;Decreased stride length;Antalgic  General Gait Details pt with report of increased stiffness this session, pt with mild anxiety stating "I can't do the whole loop. it's clicking and locking  like it did before surgery."  Gait velocity slow  Gait velocity interpretation <1.31 ft/sec, indicative of household ambulator  Balance  Overall balance assessment Needs assistance  Sitting-balance support Feet supported;No upper extremity supported  Sitting balance-Leahy Scale Good  Standing balance support No upper extremity supported;During functional activity  Standing balance-Leahy Scale Fair  General Comments  General comments (skin integrity, edema, etc.) L knee swelling as expected  Exercises  Exercises Total Joint  Total Joint Exercises  Ankle Circles/Pumps AROM;Both;10 reps;Supine  Quad Sets AROM;Left;10 reps;Supine  Heel Slides AROM;AAROM;Left;10 reps;Seated (with wash cloth under L foot to slide on floor)  PT - End of Session  Equipment Utilized During Treatment Gait belt  Activity Tolerance Patient tolerated treatment well  Patient left in chair;with call bell/phone within reach  Nurse Communication Mobility status   PT - Assessment/Plan  PT Visit Diagnosis Unsteadiness on feet (R26.81);Difficulty in walking, not elsewhere classified (R26.2)  PT Frequency (ACUTE ONLY) 7X/week  Follow Up Recommendations Home health PT;Supervision/Assistance - 24 hour;Follow surgeon's recommendation for DC plan and follow-up therapies  PT equipment Rolling walker with 5" wheels (pt reports she has 2 at home)  AM-PAC PT "6 Clicks" Mobility Outcome Measure (Version 2)  Help needed turning from your back to your side while in a flat bed without using bedrails? 4  Help needed moving from lying on your back to sitting on the side of a flat bed without using bedrails? 4  Help needed moving to and from a bed to a chair (including a wheelchair)? 4  Help needed standing up from a chair using your arms (e.g., wheelchair or bedside chair)? 4  Help needed  to walk in hospital room? 3  Help needed climbing 3-5 steps with a railing?  3  6 Click Score 22  Consider Recommendation of Discharge To: Home  with no services  PT Goal Progression  Progress towards PT goals Progressing toward goals  Acute Rehab PT Goals  PT Goal Formulation With patient  Time For Goal Achievement 03/02/21  Potential to Achieve Goals Good  PT Time Calculation  PT Start Time (ACUTE ONLY) 1347  PT Stop Time (ACUTE ONLY) 1404  PT Time Calculation (min) (ACUTE ONLY) 17 min  PT General Charges  $$ ACUTE PT VISIT 1 Visit  PT Treatments  $Gait Training 8-22 mins  Katie Weber, PT   Acute Rehabilitation Services  Pager [phone] Office [phone] 02/17/2021

## 2021-02-17 NOTE — Progress Notes (Signed)
Physical Therapy Treatment Patient Details Name: Katie Weber MRN: [id number] DOB: Sep 30, 1946 Today's Date: 02/17/2021    History of Present Illness Pt is a 75yo female admitted for elective L TKA. PMH: diverticulitis, GERD, hiatal hernia PSH: break surgery    PT Comments    Pt seated in recliner.  Reviewed HEP and progress gt training.  Pt to d/c home with support from her spouse.     Follow Up Recommendations  Home health PT;Supervision/Assistance - 24 hour;Follow surgeon's recommendation for DC plan and follow-up therapies     Equipment Recommendations  Rolling walker with 5" wheels    Recommendations for Other Services       Precautions / Restrictions Precautions Precautions: Knee Precaution Booklet Issued: Yes (comment) Precaution Comments: pt educated on precautions, given handout Restrictions Weight Bearing Restrictions: Yes LLE Weight Bearing: Weight bearing as tolerated    Mobility  Bed Mobility               General bed mobility comments: Pt in recliner on arrival.    Transfers Overall transfer level: Needs assistance Equipment used: Rolling walker (2 wheeled) Transfers: Sit to/from Stand Sit to Stand: Supervision         General transfer comment: VCs to push from seated surface.  Ambulation/Gait Ambulation/Gait assistance: Supervision Gait Distance (Feet): 200 Feet Assistive device: Rolling walker (2 wheeled) Gait Pattern/deviations: Step-through pattern;Trunk flexed;Decreased stride length;Antalgic;Decreased stance time - left Gait velocity: slow   General Gait Details: Decreased knee flexion on LLE with swing phase.  Cues for L toe off.   Stairs             Wheelchair Mobility    Modified Rankin (Stroke Patients Only)       Balance Overall balance assessment: Needs assistance Sitting-balance support: Feet supported;No upper extremity supported Sitting balance-Leahy Scale: Good       Standing balance-Leahy  Scale: Fair                              Cognition Arousal/Alertness: Awake/alert Behavior During Therapy: WFL for tasks assessed/performed Overall Cognitive Status: Within Functional Limits for tasks assessed                                        Exercises Total Joint Exercises Ankle Circles/Pumps: AROM;Both;Supine;20 reps Quad Sets: AROM;Left;10 reps;Supine Short Arc Quad: AAROM;Left;10 reps;Supine Heel Slides: AAROM;Left;10 reps;Supine Hip ABduction/ADduction: AAROM;Left;10 reps;Supine Straight Leg Raises: AAROM;Left;10 reps;Supine    General Comments        Pertinent Vitals/Pain Pain Assessment: 0-10 Pain Score: 5  Pain Location: L knee Pain Descriptors / Indicators: Discomfort;Sore Pain Intervention(s): Monitored during session;Repositioned    Home Living                      Prior Function            PT Goals (current goals can now be found in the care plan section) Acute Rehab PT Goals Patient Stated Goal: get better Potential to Achieve Goals: Good Progress towards PT goals: Progressing toward goals    Frequency    7X/week      PT Plan Current plan remains appropriate    Co-evaluation              AM-PAC PT "6 Clicks" Mobility   Outcome Measure  Help needed turning from  your back to your side while in a flat bed without using bedrails?: A Little Help needed moving from lying on your back to sitting on the side of a flat bed without using bedrails?: A Little Help needed moving to and from a bed to a chair (including a wheelchair)?: A Little Help needed standing up from a chair using your arms (e.g., wheelchair or bedside chair)?: A Little Help needed to walk in hospital room?: A Little Help needed climbing 3-5 steps with a railing? : A Little 6 Click Score: 18    End of Session Equipment Utilized During Treatment: Gait belt Activity Tolerance: Patient tolerated treatment well Patient left: in  chair;with call bell/phone within reach Nurse Communication: Mobility status PT Visit Diagnosis: Unsteadiness on feet (R26.81);Difficulty in walking, not elsewhere classified (R26.2)     Time: 9326-7124 PT Time Calculation (min) (ACUTE ONLY): 27 min  Charges:  $Gait Training: 8-22 mins $Therapeutic Exercise: 8-22 mins                     Erasmo Leventhal , PTA Acute Rehabilitation Services Pager [phone] Office [phone]     Mahreen Schewe Eli Hose 02/17/2021, 4:41 PM

## 2021-02-17 NOTE — TOC Transition Note (Signed)
Transition of Care [facility]) - CM/SW Discharge Note   Patient Details  Name: Katie Weber MRN: [id number] Date of Birth: [birth year]  [facility]) CM/SW Contact:  Sharin Mons, RN Phone Number: 02/17/2021, 11:31 AM   Clinical Narrative:    Patient will DC to: home Anticipated DC date: 02/17/2021 Family notified: yes, husband Transport by: car     - Status post total left knee replacement, 02/16/2021  Per MD patient ready for DC today once cleared by PT  This afternoon. RN, patient, and patient's husband notified of DC. Home health PT referral made with [facility] preoperatively and accepted, start of care with 48 hrs post discharge. Pt will be provided a 3 in 1/BSC by nurse from floor stock prior to d/c. Pt already has rolling walker @ home. Pt without Rx med concerns. Post hospital f/u noted on AVS. Husband to provide transportation to home.   RNCM will sign off for now as intervention is no longer needed. Please consult [place] again if new needs arise.    Final next level of care: Home w Home Health Services Barriers to Discharge: No Barriers Identified   Patient Goals and CMS Choice Patient states their goals for this hospitalization and ongoing recovery are:: to go home and get better   Choice offered to / list presented to : Patient  Discharge Placement                       Discharge Plan and Services                DME Arranged: 3-N-1 (Pt already has rolling walker @ home) DME Agency: [employer] Date DME Agency Contacted: 02/17/21 Time DME Agency Contacted: 29   HH Arranged: PT [place]: [place] ([place]) Date HH Agency Contacted: 02/17/21 Time [place]: 1118 Representative spoke with at [place]: [place] ([place]) Interventions     Readmission Risk Interventions No flowsheet data found.

## 2021-02-17 NOTE — Progress Notes (Signed)
Patient ID: Katie Weber, female   DOB: [birth year], 75 y.o.   MRN: [id number] Looks good overall this am.  Her left knee is stable.  Vitals are stable.  She is very motivated.  Can go home today after PT.

## 2021-02-21 ENCOUNTER — Encounter (HOSPITAL_COMMUNITY): Payer: Self-pay | Admitting: Orthopaedic Surgery

## 2021-02-21 NOTE — Anesthesia Postprocedure Evaluation (Signed)
Anesthesia Post Note  Patient: Katie Weber  Procedure(s) Performed: LEFT TOTAL KNEE ARTHROPLASTY (Left Knee)     Patient location during evaluation: PACU Anesthesia Type: Regional, MAC and Spinal Level of consciousness: awake and alert Pain management: pain level controlled Vital Signs Assessment: post-procedure vital signs reviewed and stable Respiratory status: spontaneous breathing, nonlabored ventilation, respiratory function stable and patient connected to nasal cannula oxygen Cardiovascular status: stable and blood pressure returned to baseline Postop Assessment: no apparent nausea or vomiting Anesthetic complications: no   No complications documented.  Last Vitals:  Vitals:   02/17/21 0802 02/17/21 1218  BP: 124/63 115/62  Pulse: 87 70  Resp: 17 19  Temp: 37.5 C 37.2 C  SpO2: 98% 93%    Last Pain:  Vitals:   02/17/21 1340  TempSrc:   PainSc: 3                  Daisja Kessinger

## 2021-02-22 ENCOUNTER — Telehealth: Payer: Self-pay | Admitting: Orthopaedic Surgery

## 2021-02-22 NOTE — Telephone Encounter (Signed)
Pt would like to know if its okay to take one oxycodone then tylenol and keep switching back and forth. CB [phone]

## 2021-02-22 NOTE — Telephone Encounter (Signed)
I called pt and advised that this was ok. Pt stated understanding

## 2021-03-01 ENCOUNTER — Inpatient Hospital Stay: Payer: Medicare Other | Admitting: Orthopaedic Surgery

## 2021-03-02 ENCOUNTER — Other Ambulatory Visit: Payer: Self-pay

## 2021-03-02 ENCOUNTER — Encounter: Payer: Self-pay | Admitting: Orthopaedic Surgery

## 2021-03-02 ENCOUNTER — Ambulatory Visit (INDEPENDENT_AMBULATORY_CARE_PROVIDER_SITE_OTHER): Payer: Medicare Other | Admitting: Orthopaedic Surgery

## 2021-03-02 DIAGNOSIS — Z96652 Presence of left artificial knee joint: Secondary | ICD-10-CM

## 2021-03-02 MED ORDER — OXYCODONE HCL 5 MG PO TABS: 5.0000 mg | ORAL_TABLET | Freq: Four times a day (QID) | ORAL | 0 refills | Status: DC | PRN

## 2021-03-02 NOTE — Progress Notes (Signed)
The patient is 2 weeks status post a left total knee arthroplasty.  She is doing well and is ready to transition to outpatient physical therapy.  On exam her staple line looks good so remove the staples in place Steri-Strips.  Calf is soft.  She has almost full extension to 95 degrees flexion of that left operative knee.  We will transition her to outpatient physical therapy hopefully upstairs here at [facility].  I will refill her oxycodone.  All questions and concerns were answered and addressed.  I gave her praise for pushing herself hard with her motion.  We will see what she looks like in 4 weeks with a repeat exam but no x-rays are needed.

## 2021-03-11 ENCOUNTER — Other Ambulatory Visit: Payer: Self-pay | Admitting: Orthopaedic Surgery

## 2021-03-14 ENCOUNTER — Telehealth: Payer: Self-pay

## 2021-03-14 DIAGNOSIS — Z96652 Presence of left artificial knee joint: Secondary | ICD-10-CM

## 2021-03-14 NOTE — Telephone Encounter (Signed)
[facility] called she stated the patient is still waiting to be scheduled for outpatient pt withour office [phone]

## 2021-03-14 NOTE — Addendum Note (Signed)
Addended by: Robyne Peers on: 03/14/2021 02:22 PM   Modules accepted: Orders

## 2021-03-14 NOTE — Telephone Encounter (Signed)
Is this ok?

## 2021-03-14 NOTE — Telephone Encounter (Signed)
The order was missed last week. Any way to get her scheduled asap?

## 2021-03-15 ENCOUNTER — Ambulatory Visit (INDEPENDENT_AMBULATORY_CARE_PROVIDER_SITE_OTHER): Payer: Medicare Other | Admitting: Physical Therapy

## 2021-03-15 ENCOUNTER — Encounter: Payer: Self-pay | Admitting: Physical Therapy

## 2021-03-15 ENCOUNTER — Other Ambulatory Visit: Payer: Self-pay

## 2021-03-15 DIAGNOSIS — M6281 Muscle weakness (generalized): Secondary | ICD-10-CM

## 2021-03-15 DIAGNOSIS — R600 Localized edema: Secondary | ICD-10-CM

## 2021-03-15 DIAGNOSIS — M25562 Pain in left knee: Secondary | ICD-10-CM | POA: Diagnosis not present

## 2021-03-15 DIAGNOSIS — M25662 Stiffness of left knee, not elsewhere classified: Secondary | ICD-10-CM

## 2021-03-15 NOTE — Patient Instructions (Signed)
Access Code: [email or web address] URL: [email or web address] Date: 03/15/2021 Prepared by: Kearney Hard  Exercises Seated Straight Leg Raise with Support - 2-3 x daily - 7 x weekly - 2 sets - 10 reps Supine Bridge - 2-3 x daily - 7 x weekly - 2 sets - 10 reps Prone Quadriceps Stretch with Strap - 2-3 x daily - 7 x weekly - 3 sets - 10 reps Standing Hip Abduction with Counter Support - 2 x daily - 7 x weekly - 2 sets - 10 reps Heel rises with counter support - 2 x daily - 7 x weekly - 2 sets - 10 reps Standing Single Leg Stance with Counter Support - 2 x daily - 7 x weekly

## 2021-03-15 NOTE — Therapy (Signed)
[facility] Therapy [address] [place], [place], [address] Phone: [phone]   Fax:  [phone]  Physical Therapy Evaluation  Patient Details  Name: Katie Weber MRN: [id number] Date of Birth: 08-Nov-1945 Referring Provider (PT): Jean Rosenthal MD   Encounter Date: 03/15/2021   PT End of Session - 03/15/21 1536    Visit Number 1    Number of Visits 16    Date for PT Re-Evaluation 05/13/21    Progress Note Due on Visit 10    PT Start Time 2355    PT Stop Time 1555    PT Time Calculation (min) 40 min    Activity Tolerance Patient tolerated treatment well    Behavior During Therapy [facility] for tasks assessed/performed           Past Medical History:  Diagnosis Date  . Anemia    when pt was younger  . Arthritis    arthritis fingers  . Asthma   . Coronary artery calcification 04/24/2017  . Diverticulitis    x2 -last 1'17 tx. outpatient.  . Environmental and seasonal allergies    2014 Possibly related to air freshners.  . 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  . Family history of anesthesia complication 73UKG ago   allergic reaction to atropine  . GERD (gastroesophageal reflux disease)    no meds  . History of hiatal hernia 09/14/2014   seen on xray  . History of kidney stones    '80's lithotripsy-[facility] x1  . History of kidney stones    x1  . Hyperlipidemia   . Hypothyroidism   . Pneumonia 2014  . Pure hypercholesterolemia   . Tremor of left hand     Past Surgical History:  Procedure Laterality Date  . BALLOON DILATION N/A 05/04/2014   Procedure: BALLOON DILATION;  Surgeon: Garlan Fair, MD;  Location: [facility];  Service: Endoscopy;  Laterality: N/A;  . BREAST LUMPECTOMY Right 1972   benign   . BREAST SURGERY Right    '72 -benign tumor  . childbirth- NVD x2     . COLONOSCOPY WITH PROPOFOL N/A 05/04/2014   Procedure: COLONOSCOPY WITH PROPOFOL;   Surgeon: Garlan Fair, MD;  Location: [facility];  Service: Endoscopy;  Laterality: N/A;  . DILATION AND CURETTAGE OF UTERUS     04-20-14 [facility] hospital endometrial polyp removed.  . ESOPHAGOGASTRODUODENOSCOPY (EGD) WITH PROPOFOL N/A 05/04/2014   Procedure: ESOPHAGOGASTRODUODENOSCOPY (EGD) WITH PROPOFOL;  Surgeon: Garlan Fair, MD;  Location: [facility];  Service: Endoscopy;  Laterality: N/A;. Procedure done in [place], [place]- 5 yrs ago.  [facility] HYSTEROSCOPY WITH D & C N/A 04/20/2014   Procedure: DILATATION AND CURETTAGE /HYSTEROSCOPY;  Surgeon: [name], MD;  Location: [facility];  Service: Gynecology;  Laterality: N/A;  . LITHOTRIPSY    . POLYPECTOMY  2020   Dr. Lear Ng  . TOTAL KNEE ARTHROPLASTY Left 02/15/2021   Procedure: LEFT TOTAL KNEE ARTHROPLASTY;  Surgeon: Mcarthur Rossetti, MD;  Location: [facility];  Service: Orthopedics;  Laterality: Left;  . TUBAL LIGATION      There were no vitals filed for this visit.    Subjective Assessment - 03/15/21 1522    Subjective Pt arriving to [facility] s/p left TKA 02/15/2021, Pt reporting receiving 3 weeks of HHPT. Pt reporting 2/10 pain. Pt has been walking [place]    Pertinent History arthritis, anemia, breast surgery, diverticulitis, GERD, kidney stones,  hyperlipidemia, hypothyrodism, tremor left hand    Patient Stated Goals Walking around neighborhood, playing with grandchildren    Currently in Pain? Yes    Pain Score 2     Pain Location Knee    Pain Orientation Left    Pain Descriptors / Indicators Sore;Aching    Pain Type Surgical pain;Acute pain    Pain Onset 1 to 4 weeks ago    Pain Frequency Intermittent    Aggravating Factors  sleeping    Pain Relieving Factors pain meds, icing    Effect of Pain on Daily Activities difficulty with household chores              [facility] PT Assessment - 03/15/21 0001      Assessment   Medical Diagnosis z96.652 left TKA    Referring Provider (PT) Jean Rosenthal MD    Hand Dominance Right    Prior Therapy HHPT 3 weeks      Precautions   Precautions None      Restrictions   Weight Bearing Restrictions No      Balance Screen   Has the patient fallen in the past 6 months Yes    How many times? 1   fell off ladder   Has the patient had a decrease in activity level because of a fear of falling?  No    Is the patient reluctant to leave their home because of a fear of falling?  No      Home Environment   Living Environment Private residence    Living Arrangements Spouse/significant other    Type of [place] to enter      Prior Function   Level of Independence Independent    Leisure walking, playing with grandchildren      Cognition   Overall Cognitive Status Within Functional Limits for tasks assessed      Observation/Other Assessments   Focus on Therapeutic Outcomes (FOTO)  60 (predicted 70)      Observation/Other Assessments-Edema    Edema Circumferential      Circumferential Edema   Circumferential - Right 43 centimeters    Circumferential - Left  47.5 centimeters      Posture/Postural Control   Posture/Postural Control Postural limitations    Postural Limitations Rounded Shoulders;Forward head      ROM / Strength   AROM / PROM / Strength AROM;PROM;Strength      AROM   AROM Assessment Site Knee    Right/Left Knee Right;Left    Right Knee Extension 0    Right Knee Flexion 135    Left Knee Extension 2    Left Knee Flexion 110      PROM   PROM Assessment Site Knee    Right/Left Knee Left    Left Knee Extension 0    Left Knee Flexion 115      Strength   Strength Assessment Site Knee    Right/Left Knee Left    Left Knee Flexion 5/5    Left Knee Extension 4/5      Palpation   Patella mobility WFL      Transfers   Five time sit to stand comments  9 seconds with no UE support      Ambulation/Gait   Assistive device None    Gait Pattern Step-through pattern;Antalgic                       Objective measurements completed on  examination: See above findings.       [facility] Adult PT Treatment/Exercise - 03/15/21 0001      Exercises   Exercises Knee/Hip      Knee/Hip Exercises: Seated   Other Seated Knee/Hip Exercises seated SLR x 5      Knee/Hip Exercises: Supine   Bridges Strengthening;10 reps    Bridges Limitations hodling 5 seconds    Straight Leg Raises Strengthening;10 reps                  PT Education - 03/15/21 1527    Education Details PT POC, HEP    Person(s) Educated Patient;Spouse    Methods Explanation;Demonstration;Handout;Verbal cues;Tactile cues    Comprehension Verbalized understanding;Returned demonstration            PT Short Term Goals - 03/15/21 1537      PT SHORT TERM GOAL #1   Title Pt will be independent in her initial HEP.    Time 3    Period Weeks    Status New    Target Date 04/08/21             PT Long Term Goals - 03/15/21 1620      PT LONG TERM GOAL #1   Title Pt will be independent in her advanced HEP.    Time 8    Period Weeks    Status New    Target Date 05/13/21      PT LONG TERM GOAL #2   Title Pt will be able to get up and down off the floor with no pain reported.    Time 8    Period Weeks    Status New    Target Date 05/13/21      PT LONG TERM GOAL #3   Title Pt will improve her FOTO to >/=    Time 8    Period Weeks    Status New    Target Date 05/13/21      PT LONG TERM GOAL #4   Title Pt will improve her Single Leg Stance to >/= 15 seconds on left LE.    Time 8    Period Weeks    Status New      PT LONG TERM GOAL #5   Title Pt will be able to climb a flight of stairs with step over step pattern with no UE support    Time 8    Period Weeks    Status New      Additional Long Term Goals   Additional Long Term Goals Yes      PT LONG TERM GOAL #6   Title Pt will improve her left knee flexion to >/= 125 degrees.    Time 8    Period Weeks    Status New                   Plan - 03/15/21 1631    Clinical Impression Statement Pt s/p left total knee replacement on 02/15/2021 by Dr. [name]. Pt arriving reporting 2/10 pain in left knee. Pt has been working with Mazie and has been consistently performing her home exercises. Three additional exercises were added to pt's HEP and pt instructed to continue her standing heel raises, hip abduction and single leg stance. Pt with AROM 2-110 degrees, PROM 0-115 degrees. Pt with mild quad strength deficit of 4/5 with MMT. Pt is amb with no assistive device with step through gait with mild antalgic gait. Skilled PT needed  to progress pt toward her PLOF.    Personal Factors and Comorbidities Comorbidity 3+    Comorbidities arthritis, anemia, breast surgery, diverticulitis, GERD, kidney stones, hyperlipidemia, hypothyrodism, tremor left hand    Examination-Activity Limitations Squat;Stairs;Stand    Examination-Participation Restrictions Community Activity;Other    Stability/Clinical Decision Making Stable/Uncomplicated    Clinical Decision Making Low    Rehab Potential Excellent    PT Frequency 2x / week    PT Duration 8 weeks    PT Treatment/Interventions ADLs/Self Care Home Management;Cryotherapy;Presenter, broadcasting;Therapeutic exercise;Therapeutic activities;Functional mobility training;Stair training;Gait training;Neuromuscular re-education;Patient/family education;Passive range of motion;Manual techniques;Dry needling;Taping;Vasopneumatic Device    PT Next Visit Plan Bike, Leg press, cybex knee extension, SLS, balance, step ups    PT Home Exercise Plan Access Code: [email or web address]  URL: [email or web address]  Date: 03/15/2021  Prepared by: Kearney Hard    Exercises  Seated Straight Leg Raise with Support - 2-3 x daily - 7 x weekly - 2 sets - 10 reps  Supine Bridge - 2-3 x daily - 7 x weekly - 2 sets - 10 reps  Prone Quadriceps Stretch with Strap - 2-3 x  daily - 7 x weekly - 3 sets - 10 reps  Standing Hip Abduction with Counter Support - 2 x daily - 7 x weekly - 2 sets - 10 reps  Heel rises with counter support - 2 x daily - 7 x weekly - 2 sets - 10 reps  Standing Single Leg Stance with Counter Support - 2 x daily - 7 x weekly    Consulted and Agree with Plan of Care Patient           Patient will benefit from skilled therapeutic intervention in order to improve the following deficits and impairments:  Pain,Decreased strength,Decreased balance,Impaired flexibility,Difficulty walking,Increased edema  Visit Diagnosis: Acute pain of left knee  Muscle weakness (generalized)  Stiffness of left knee, not elsewhere classified  Localized edema     Problem List Patient Active Problem List   Diagnosis Date Noted  . 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 Unilateral primary osteoarthritis, left knee 01/04/2021  . Coronary artery calcification 04/24/2017  . Hyperlipidemia 04/24/2017  . Acute bronchitis 09/29/2014  . SBO (small bowel obstruction) ([place]) 09/27/2014  . Nausea and vomiting 09/27/2014  . Hypothyroidism 09/27/2014  . Leukocytosis 09/27/2014    Oretha Caprice, PT, MPT 03/15/2021, 4:42 PM  [facility] Physical Therapy [address] [place], [place], [address] Phone: [phone]   Fax:  [phone]  Name: Michille Mcelrath MRN: [id number] Date of Birth: 1946/04/09

## 2021-03-22 ENCOUNTER — Ambulatory Visit (INDEPENDENT_AMBULATORY_CARE_PROVIDER_SITE_OTHER): Payer: Medicare Other | Admitting: Physical Therapy

## 2021-03-22 ENCOUNTER — Encounter: Payer: Self-pay | Admitting: Physical Therapy

## 2021-03-22 ENCOUNTER — Other Ambulatory Visit: Payer: Self-pay

## 2021-03-22 DIAGNOSIS — M6281 Muscle weakness (generalized): Secondary | ICD-10-CM

## 2021-03-22 DIAGNOSIS — R600 Localized edema: Secondary | ICD-10-CM

## 2021-03-22 DIAGNOSIS — M25562 Pain in left knee: Secondary | ICD-10-CM | POA: Diagnosis not present

## 2021-03-22 DIAGNOSIS — M25662 Stiffness of left knee, not elsewhere classified: Secondary | ICD-10-CM

## 2021-03-22 NOTE — Therapy (Signed)
[facility] Physical Therapy [address] [place], [place], [address] Phone: [phone]   Fax:  [phone]  Physical Therapy Treatment  Patient Details  Name: Katie Weber MRN: [id number] Date of Birth: 1946/01/28 Referring Provider (PT): Jean Rosenthal MD   Encounter Date: 03/22/2021   PT End of Session - 03/22/21 1505     Visit Number 2    Number of Visits 16    Date for PT Re-Evaluation 05/13/21    Progress Note Due on Visit 10    PT Start Time 1435    PT Stop Time 1515    PT Time Calculation (min) 40 min    Activity Tolerance Patient tolerated treatment well    Behavior During Therapy [facility] for tasks assessed/performed             Past Medical History:  Diagnosis Date   Anemia    when pt was younger   Arthritis    arthritis fingers   Asthma    Coronary artery calcification 04/24/2017   Diverticulitis    x2 -last 1'17 tx. outpatient.   Environmental and seasonal allergies    2014 Possibly related to air freshners.   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   Family history of anesthesia complication 24MPN ago   allergic reaction to atropine   GERD (gastroesophageal reflux disease)    no meds   History of hiatal hernia 09/14/2014   seen on xray   History of kidney stones    '80's lithotripsy-[facility] x1   History of kidney stones    x1   Hyperlipidemia    Hypothyroidism    Pneumonia 2014   Pure hypercholesterolemia    Tremor of left hand     Past Surgical History:  Procedure Laterality Date   BALLOON DILATION N/A 05/04/2014   Procedure: BALLOON DILATION;  Surgeon: Garlan Fair, MD;  Location: [facility];  Service: Endoscopy;  Laterality: N/A;   BREAST LUMPECTOMY Right 1972   benign    BREAST SURGERY Right    '72 -benign tumor   childbirth- NVD x2      COLONOSCOPY WITH PROPOFOL N/A 05/04/2014   Procedure: COLONOSCOPY WITH PROPOFOL;  Surgeon: Garlan Fair, MD;  Location: [facility];  Service: Endoscopy;  Laterality: N/A;   DILATION AND CURETTAGE OF UTERUS     04-20-14 [facility] hospital endometrial polyp removed.   ESOPHAGOGASTRODUODENOSCOPY (EGD) WITH PROPOFOL N/A 05/04/2014   Procedure: ESOPHAGOGASTRODUODENOSCOPY (EGD) WITH PROPOFOL;  Surgeon: Garlan Fair, MD;  Location: [facility];  Service: Endoscopy;  Laterality: N/A;. Procedure done in [place], [place]- 5 yrs ago.   HYSTEROSCOPY WITH D & C N/A 04/20/2014   Procedure: DILATATION AND CURETTAGE /HYSTEROSCOPY;  Surgeon: [name], MD;  Location: [facility];  Service: Gynecology;  Laterality: N/A;   LITHOTRIPSY     POLYPECTOMY  2020   Dr. Lear Ng   TOTAL KNEE ARTHROPLASTY Left 02/15/2021   Procedure: LEFT TOTAL KNEE ARTHROPLASTY;  Surgeon: Mcarthur Rossetti, MD;  Location: [facility];  Service: Orthopedics;  Laterality: Left;   TUBAL LIGATION      There were no vitals filed for this visit.   Subjective Assessment - 03/22/21 1440     Subjective Pt states she is doing well. She states she is walking more than before around her neighborhood. No issues with HEP.    Pertinent History arthritis, anemia, breast surgery, diverticulitis, GERD, kidney stones, hyperlipidemia, hypothyrodism,  tremor left hand    Patient Stated Goals Walking around neighborhood, playing with grandchildren    Currently in Pain? No/denies    Pain Score 0-No pain    Pain Onset 1 to 4 weeks ago                [facility] PT Assessment - 03/22/21 0001       AROM   Left Knee Extension 0    Left Knee Flexion 122                           [facility] Adult PT Treatment/Exercise - 03/22/21 0001       Transfers   Five time sit to stand comments  9 seconds with no UE support      Ambulation/Gait   Assistive device None    Gait Pattern Step-through pattern;Lateral trunk lean to right      Posture/Postural Control   Posture/Postural Control Postural limitations    Postural  Limitations Rounded Shoulders;Forward head      Exercises   Exercises Knee/Hip      Knee/Hip Exercises: Stretches   Quad Stretch Limitations prone quad stretch 30s 3x      Knee/Hip Exercises: Standing   Other Standing Knee Exercises STS 2x10    Other Standing Knee Exercises standing heel toe rocking 2x10      Knee/Hip Exercises: Seated   Other Seated Knee/Hip Exercises seated SLR x 5      Knee/Hip Exercises: Supine   Bridges Strengthening;10 reps    Bridges Limitations 3x10 5s hold    Straight Leg Raises Strengthening;10 reps    Straight Leg Raises Limitations 2x10   cued for quad set in between each rep                   PT Education - 03/22/21 1505     Education Details biomechanics, anatomy, exercise progression, DOMS expectations, edema management, thermotherapy, muscle firing, HEP, POC    Person(s) Educated Patient    Methods Demonstration;Explanation;Tactile cues;Verbal cues    Comprehension Verbalized understanding;Returned demonstration;Verbal cues required;Tactile cues required              PT Short Term Goals - 03/15/21 1537       PT SHORT TERM GOAL #1   Title Pt will be independent in her initial HEP.    Time 3    Period Weeks    Status New    Target Date 04/08/21               PT Long Term Goals - 03/15/21 1620       PT LONG TERM GOAL #1   Title Pt will be independent in her advanced HEP.    Time 8    Period Weeks    Status New    Target Date 05/13/21      PT LONG TERM GOAL #2   Title Pt will be able to get up and down off the floor with no pain reported.    Time 8    Period Weeks    Status New    Target Date 05/13/21      PT LONG TERM GOAL #3   Title Pt will improve her FOTO to >/=    Time 8    Period Weeks    Status New    Target Date 05/13/21      PT LONG TERM GOAL #4   Title Pt will improve  her Single Leg Stance to >/= 15 seconds on left LE.    Time 8    Period Weeks    Status New      PT LONG TERM GOAL #5    Title Pt will be able to climb a flight of stairs with step over step pattern with no UE support    Time 8    Period Weeks    Status New      Additional Long Term Goals   Additional Long Term Goals Yes      PT LONG TERM GOAL #6   Title Pt will improve her left knee flexion to >/= 125 degrees.    Time 8    Period Weeks    Status New                   Plan - 03/22/21 1507     Clinical Impression Statement Pt was able to progress exercise at today's session and improved knee flexion to 122. Pt able to progress CKC exercise with STS but required increased VC for equal weightbearing and slow eccentric control. Pt progressed to independent walking and advised to use SPC only when on uneven surfaces or in crowded environments. Pt is progressing very well with L knee ROM. Slight extensor lag still exists with SLR. Plan to progress functional strengthening and isolated strengthening as tolerated. Pt would benefit from continued skilled therapy in order to reach goals and maximize functional L knee strength and ROM for full return to PLOF.    Personal Factors and Comorbidities Comorbidity 3+    Comorbidities arthritis, anemia, breast surgery, diverticulitis, GERD, kidney stones, hyperlipidemia, hypothyrodism, tremor left hand    Examination-Activity Limitations Squat;Stairs;Stand    Examination-Participation Restrictions Community Activity;Other    Stability/Clinical Decision Making Stable/Uncomplicated    Rehab Potential Excellent    PT Frequency 2x / week    PT Duration 8 weeks    PT Treatment/Interventions ADLs/Self Care Home Management;Cryotherapy;Presenter, broadcasting;Therapeutic exercise;Therapeutic activities;Functional mobility training;Stair training;Gait training;Neuromuscular re-education;Patient/family education;Passive range of motion;Manual techniques;Dry needling;Taping;Vasopneumatic Device    PT Next Visit Plan Bike, Leg press, cybex  knee extension, SLS, balance, step ups    PT Home Exercise Plan Access Code: [email or web address]  URL: [email or web address]  Date: 03/15/2021  Prepared by: Kearney Hard    Exercises  Seated Straight Leg Raise with Support - 2-3 x daily - 7 x weekly - 2 sets - 10 reps  Supine Bridge - 2-3 x daily - 7 x weekly - 2 sets - 10 reps  Prone Quadriceps Stretch with Strap - 2-3 x daily - 7 x weekly - 3 sets - 10 reps  Standing Hip Abduction with Counter Support - 2 x daily - 7 x weekly - 2 sets - 10 reps  Heel rises with counter support - 2 x daily - 7 x weekly - 2 sets - 10 reps  Standing Single Leg Stance with Counter Support - 2 x daily - 7 x weekly    Consulted and Agree with Plan of Care Patient             Patient will benefit from skilled therapeutic intervention in order to improve the following deficits and impairments:  Pain, Decreased strength, Decreased balance, Impaired flexibility, Difficulty walking, Increased edema  Visit Diagnosis: Acute pain of left knee  Stiffness of left knee, not elsewhere classified  Muscle weakness (generalized)  Localized edema     Problem List Patient Active Problem List   Diagnosis  Date Noted   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 [place], [place], [address] Phone: [phone]   Fax:  [phone]  Name: Katie Weber MRN: [id number] Date of Birth: 06-10-1946

## 2021-03-25 ENCOUNTER — Ambulatory Visit (INDEPENDENT_AMBULATORY_CARE_PROVIDER_SITE_OTHER): Payer: Medicare Other | Admitting: Rehabilitative and Restorative Service Providers"

## 2021-03-25 ENCOUNTER — Encounter: Payer: Self-pay | Admitting: Rehabilitative and Restorative Service Providers"

## 2021-03-25 ENCOUNTER — Other Ambulatory Visit: Payer: Self-pay

## 2021-03-25 DIAGNOSIS — M25562 Pain in left knee: Secondary | ICD-10-CM | POA: Diagnosis not present

## 2021-03-25 DIAGNOSIS — M25662 Stiffness of left knee, not elsewhere classified: Secondary | ICD-10-CM

## 2021-03-25 DIAGNOSIS — R600 Localized edema: Secondary | ICD-10-CM

## 2021-03-25 DIAGNOSIS — M6281 Muscle weakness (generalized): Secondary | ICD-10-CM

## 2021-03-25 NOTE — Therapy (Signed)
[facility] [address] [place], [place], [address] Phone: [phone]   Fax:  [phone]  Physical Therapy Treatment  Patient Details  Name: Katie Weber MRN: [id number] Date of Birth: Jan 20, 1946 Referring Provider (PT): Jean Rosenthal MD   Encounter Date: 03/25/2021   PT End of Session - 03/25/21 1009     Visit Number 3    Number of Visits 16    Date for PT Re-Evaluation 05/13/21    Progress Note Due on Visit 10    PT Start Time 1010    PT Stop Time 1050    PT Time Calculation (min) 40 min    Activity Tolerance Patient tolerated treatment well    Behavior During Therapy [facility] for tasks assessed/performed             Past Medical History:  Diagnosis Date   Anemia    when pt was younger   Arthritis    arthritis fingers   Asthma    Coronary artery calcification 04/24/2017   Diverticulitis    x2 -last 1'17 tx. outpatient.   Environmental and seasonal allergies    2014 Possibly related to air freshners.   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   Family history of anesthesia complication 34LPF ago   allergic reaction to atropine   GERD (gastroesophageal reflux disease)    no meds   History of hiatal hernia 09/14/2014   seen on xray   History of kidney stones    '80's lithotripsy-[facility] x1   History of kidney stones    x1   Hyperlipidemia    Hypothyroidism    Pneumonia 2014   Pure hypercholesterolemia    Tremor of left hand     Past Surgical History:  Procedure Laterality Date   BALLOON DILATION N/A 05/04/2014   Procedure: BALLOON DILATION;  Surgeon: Garlan Fair, MD;  Location: [facility];  Service: Endoscopy;  Laterality: N/A;   BREAST LUMPECTOMY Right 1972   benign    BREAST SURGERY Right    '72 -benign tumor   childbirth- NVD x2      COLONOSCOPY WITH PROPOFOL N/A 05/04/2014   Procedure: COLONOSCOPY WITH PROPOFOL;  Surgeon: Garlan Fair, MD;  Location: [facility];  Service: Endoscopy;  Laterality: N/A;   DILATION AND CURETTAGE OF UTERUS     04-20-14 [facility] endometrial polyp removed.   ESOPHAGOGASTRODUODENOSCOPY (EGD) WITH PROPOFOL N/A 05/04/2014   Procedure: ESOPHAGOGASTRODUODENOSCOPY (EGD) WITH PROPOFOL;  Surgeon: Garlan Fair, MD;  Location: [facility];  Service: Endoscopy;  Laterality: N/A;. Procedure done in [place], [place]- 5 yrs ago.   HYSTEROSCOPY WITH D & C N/A 04/20/2014   Procedure: DILATATION AND CURETTAGE /HYSTEROSCOPY;  Surgeon: [name], MD;  Location: [facility];  Service: Gynecology;  Laterality: N/A;   LITHOTRIPSY     POLYPECTOMY  2020   Dr. Lear Ng   TOTAL KNEE ARTHROPLASTY Left 02/15/2021   Procedure: LEFT TOTAL KNEE ARTHROPLASTY;  Surgeon: Mcarthur Rossetti, MD;  Location: [place];  Service: Orthopedics;  Laterality: Left;   TUBAL LIGATION      There were no vitals filed for this visit.   Subjective Assessment - 03/25/21 1016     Subjective Pt. stated no pain upon arrival today.  Stiffness reported this morning .  Walking independently all the time.    Pertinent History arthritis, anemia, breast surgery, diverticulitis, GERD, kidney stones, hyperlipidemia, hypothyrodism, tremor left  hand    Patient Stated Goals Walking around neighborhood, playing with grandchildren    Currently in Pain? No/denies    Pain Onset 1 to 4 weeks ago                               [facility] Adult PT Treatment/Exercise - 03/25/21 0001       Neuro Re-ed    Neuro Re-ed Details  heel/toe raises alternating 20x, retro step 20 x Lt leg posterior      Knee/Hip Exercises: Stretches   Gastroc Stretch 30 seconds;3 reps;Both   incline board     Knee/Hip Exercises: Aerobic   Recumbent Bike Lvl 1 full revolutions 6 mins seat 3      Knee/Hip Exercises: Machines for Strengthening   Total Gym Leg Press 2 x 10 single leg 43 lbs, performed bilateral      Knee/Hip Exercises:  Standing   Lateral Step Up Step Height: 4";15 reps;Left   eccentric lowering focus     Knee/Hip Exercises: Seated   Other Seated Knee/Hip Exercises seated SLR bilateral 2 x 10 slow control focus    Sit to Sand 10 reps;without UE support   18 inch chair, eccentric lowering                     PT Short Term Goals - 03/15/21 1537       PT SHORT TERM GOAL #1   Title Pt will be independent in her initial HEP.    Time 3    Period Weeks    Status New    Target Date 04/08/21               PT Long Term Goals - 03/15/21 1620       PT LONG TERM GOAL #1   Title Pt will be independent in her advanced HEP.    Time 8    Period Weeks    Status New    Target Date 05/13/21      PT LONG TERM GOAL #2   Title Pt will be able to get up and down off the floor with no pain reported.    Time 8    Period Weeks    Status New    Target Date 05/13/21      PT LONG TERM GOAL #3   Title Pt will improve her FOTO to >/=    Time 8    Period Weeks    Status New    Target Date 05/13/21      PT LONG TERM GOAL #4   Title Pt will improve her Single Leg Stance to >/= 15 seconds on left LE.    Time 8    Period Weeks    Status New      PT LONG TERM GOAL #5   Title Pt will be able to climb a flight of stairs with step over step pattern with no UE support    Time 8    Period Weeks    Status New      Additional Long Term Goals   Additional Long Term Goals Yes      PT LONG TERM GOAL #6   Title Pt will improve her left knee flexion to >/= 125 degrees.    Time 8    Period Weeks    Status New  Plan - 03/25/21 1042     Clinical Impression Statement Overall active mobility continued to show good progress at this time.  Continued emphasis on quad recruitment improvement and strengthening as well as continued inclusion of weight shift control for balance intervention.  Inedepent ambulation continued.    Personal Factors and Comorbidities Comorbidity 3+     Comorbidities arthritis, anemia, breast surgery, diverticulitis, GERD, kidney stones, hyperlipidemia, hypothyrodism, tremor left hand    Examination-Activity Limitations Squat;Stairs;Stand    Examination-Participation Restrictions Community Activity;Other    Stability/Clinical Decision Making Stable/Uncomplicated    Rehab Potential Excellent    PT Frequency 2x / week    PT Duration 8 weeks    PT Treatment/Interventions ADLs/Self Care Home Management;Cryotherapy;Presenter, broadcasting;Therapeutic exercise;Therapeutic activities;Functional mobility training;Stair training;Gait training;Neuromuscular re-education;Patient/family education;Passive range of motion;Manual techniques;Dry needling;Taping;Vasopneumatic Device    PT Next Visit Plan Continue quad strengthening, balance interveniton static and dynamic    PT Home Exercise Plan Access Code: [email or web address]  URL: [email or web address]  Date: 03/15/2021  Prepared by: Kearney Hard    Exercises  Seated Straight Leg Raise with Support - 2-3 x daily - 7 x weekly - 2 sets - 10 reps  Supine Bridge - 2-3 x daily - 7 x weekly - 2 sets - 10 reps  Prone Quadriceps Stretch with Strap - 2-3 x daily - 7 x weekly - 3 sets - 10 reps  Standing Hip Abduction with Counter Support - 2 x daily - 7 x weekly - 2 sets - 10 reps  Heel rises with counter support - 2 x daily - 7 x weekly - 2 sets - 10 reps  Standing Single Leg Stance with Counter Support - 2 x daily - 7 x weekly    Consulted and Agree with Plan of Care Patient             Patient will benefit from skilled therapeutic intervention in order to improve the following deficits and impairments:  Pain, Decreased strength, Decreased balance, Impaired flexibility, Difficulty walking, Increased edema  Visit Diagnosis: Acute pain of left knee  Stiffness of left knee, not elsewhere classified  Muscle weakness (generalized)  Localized edema     Problem  List Patient Active Problem List   Diagnosis Date Noted   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 [place], [place], [address] Phone: [phone]   Fax:  [phone]  Name: Katie Weber MRN: [id number] Date of Birth: [birth year]

## 2021-03-29 ENCOUNTER — Other Ambulatory Visit: Payer: Self-pay

## 2021-03-29 ENCOUNTER — Encounter: Payer: Self-pay | Admitting: Physical Therapy

## 2021-03-29 ENCOUNTER — Ambulatory Visit (INDEPENDENT_AMBULATORY_CARE_PROVIDER_SITE_OTHER): Payer: Medicare Other | Admitting: Physical Therapy

## 2021-03-29 DIAGNOSIS — R600 Localized edema: Secondary | ICD-10-CM

## 2021-03-29 DIAGNOSIS — M25562 Pain in left knee: Secondary | ICD-10-CM | POA: Diagnosis not present

## 2021-03-29 DIAGNOSIS — M25662 Stiffness of left knee, not elsewhere classified: Secondary | ICD-10-CM | POA: Diagnosis not present

## 2021-03-29 DIAGNOSIS — M6281 Muscle weakness (generalized): Secondary | ICD-10-CM | POA: Diagnosis not present

## 2021-03-29 NOTE — Therapy (Signed)
[facility] [address] [place], [place], [address] Phone: [phone]   Fax:  [phone]  Physical Therapy Treatment  Patient Details  Name: Katie Weber MRN: [id number] Date of Birth: 05/29/1946 Referring Provider (PT): Jean Rosenthal MD   Encounter Date: 03/29/2021   PT End of Session - 03/29/21 1431     Visit Number 4    Number of Visits 16    Date for PT Re-Evaluation 05/13/21    Progress Note Due on Visit 10    PT Start Time 1430    PT Stop Time 1510    PT Time Calculation (min) 40 min    Activity Tolerance Patient tolerated treatment well    Behavior During Therapy [facility] for tasks assessed/performed             Past Medical History:  Diagnosis Date   Anemia    when pt was younger   Arthritis    arthritis fingers   Asthma    Coronary artery calcification 04/24/2017   Diverticulitis    x2 -last 1'17 tx. outpatient.   Environmental and seasonal allergies    2014 Possibly related to air freshners.   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   Family history of anesthesia complication 54YHC ago   allergic reaction to atropine   GERD (gastroesophageal reflux disease)    no meds   History of hiatal hernia 09/14/2014   seen on xray   History of kidney stones    '80's lithotripsy-[facility] x1   History of kidney stones    x1   Hyperlipidemia    Hypothyroidism    Pneumonia 2014   Pure hypercholesterolemia    Tremor of left hand     Past Surgical History:  Procedure Laterality Date   BALLOON DILATION N/A 05/04/2014   Procedure: BALLOON DILATION;  Surgeon: Garlan Fair, MD;  [facility]: [facility];  Service: Endoscopy;  Laterality: N/A;   BREAST LUMPECTOMY Right 1972   benign    BREAST SURGERY Right    '72 -benign tumor   childbirth- NVD x2      COLONOSCOPY WITH PROPOFOL N/A 05/04/2014   Procedure: COLONOSCOPY WITH PROPOFOL;  Surgeon: Garlan Fair, MD;  Location: [facility];  Service: Endoscopy;  Laterality: N/A;   DILATION AND CURETTAGE OF UTERUS     04-20-14 [facility] hospital endometrial polyp removed.   ESOPHAGOGASTRODUODENOSCOPY (EGD) WITH PROPOFOL N/A 05/04/2014   Procedure: ESOPHAGOGASTRODUODENOSCOPY (EGD) WITH PROPOFOL;  Surgeon: Garlan Fair, MD;  [facility];  Service: Endoscopy;  Laterality: N/A;. Procedure done in [place], [place]- 5 yrs ago.   HYSTEROSCOPY WITH D & C N/A 04/20/2014   Procedure: DILATATION AND CURETTAGE /HYSTEROSCOPY;  Surgeon: [name], MD;  Location: [facility];  Service: Gynecology;  Laterality: N/A;   LITHOTRIPSY     POLYPECTOMY  2020   Dr. Lear Ng   TOTAL KNEE ARTHROPLASTY Left 02/15/2021   Procedure: LEFT TOTAL KNEE ARTHROPLASTY;  Surgeon: Mcarthur Rossetti, MD;  Location: [facility];  Service: Orthopedics;  Laterality: Left;   TUBAL LIGATION      There were no vitals filed for this visit.   Subjective Assessment - 03/29/21 1428     Subjective Pt arriving today reporting stiffness especially first thing in the morning.    Pertinent History arthritis, anemia, breast surgery, diverticulitis, GERD, kidney stones, hyperlipidemia, hypothyrodism, tremor left hand    Patient Stated Goals Walking  around neighborhood, playing with grandchildren    Currently in Pain? No/denies                               [facility] Adult PT Treatment/Exercise - 03/29/21 0001       Neuro Re-ed    Neuro Re-ed Details  heel/toe raises x 20, vector with Left LE as stance x 10, side stepping, braiding 30 feet x 4 each exercise      Exercises   Exercises Knee/Hip      Knee/Hip Exercises: Stretches   Gastroc Stretch 30 seconds;3 reps;Both   incline board     Knee/Hip Exercises: Aerobic   Recumbent Bike UBE / bike with only LE's L4 x 8 minutes      Knee/Hip Exercises: Machines for Strengthening   Cybex Knee Extension 10# 3x10 slow eccentric control    Total Gym Leg  Press 50# Left LE only 2x10, bilateral LE"s 87#      Knee/Hip Exercises: Standing   Lateral Step Up Step Height: 4";15 reps;Left   eccentric lowering focus     Knee/Hip Exercises: Seated   Other Seated Knee/Hip Exercises seated SLR bilateral 2 x 10 slow control focus                      PT Short Term Goals - 03/29/21 1432       PT SHORT TERM GOAL #1   Title Pt will be independent in her initial HEP.    Status On-going               PT Long Term Goals - 03/29/21 1432       PT LONG TERM GOAL #1   Title Pt will be independent in her advanced HEP.    Status On-going      PT LONG TERM GOAL #2   Title Pt will be able to get up and down off the floor with no pain reported.    Status On-going      PT LONG TERM GOAL #3   Title Pt will improve her FOTO to >/=    Status On-going      PT LONG TERM GOAL #4   Title Pt will improve her Single Leg Stance to >/= 15 seconds on left LE.    Status On-going      PT LONG TERM GOAL #5   Title Pt will be able to climb a flight of stairs with step over step pattern with no UE support    Status On-going      PT LONG TERM GOAL #6   Title Pt will improve her left knee flexion to >/= 125 degrees.    Status On-going                   Plan - 03/29/21 1444     Clinical Impression Statement Pt arriving today reporting no pain only stiffness. Treatment focusing on strengthening and ROM. Continue to progress functional mobility,dynamic balance, gait and strengthening to maximize function.    Personal Factors and Comorbidities Comorbidity 3+    Comorbidities arthritis, anemia, breast surgery, diverticulitis, GERD, kidney stones, hyperlipidemia, hypothyrodism, tremor left hand    Examination-Activity Limitations Squat;Stairs;Stand    Examination-Participation Restrictions Community Activity;Other    Stability/Clinical Decision Making Stable/Uncomplicated    Rehab Potential Excellent    PT Frequency 2x / week    PT  Duration 8 weeks  PT Treatment/Interventions ADLs/Self Care Home Management;Cryotherapy;Presenter, broadcasting;Therapeutic exercise;Therapeutic activities;Functional mobility training;Stair training;Gait training;Neuromuscular re-education;Patient/family education;Passive range of motion;Manual techniques;Dry needling;Taping;Vasopneumatic Device    PT Next Visit Plan Continue quad strengthening, balance interveniton static and dynamic, gait.    PT Home Exercise Plan Access Code: [email or web address]  URL: [email or web address]  Date: 03/15/2021  Prepared by: Kearney Hard    Exercises  Seated Straight Leg Raise with Support - 2-3 x daily - 7 x weekly - 2 sets - 10 reps  Supine Bridge - 2-3 x daily - 7 x weekly - 2 sets - 10 reps  Prone Quadriceps Stretch with Strap - 2-3 x daily - 7 x weekly - 3 sets - 10 reps  Standing Hip Abduction with Counter Support - 2 x daily - 7 x weekly - 2 sets - 10 reps  Heel rises with counter support - 2 x daily - 7 x weekly - 2 sets - 10 reps  Standing Single Leg Stance with Counter Support - 2 x daily - 7 x weekly    Consulted and Agree with Plan of Care Patient             Patient will benefit from skilled therapeutic intervention in order to improve the following deficits and impairments:  Pain, Decreased strength, Decreased balance, Impaired flexibility, Difficulty walking, Increased edema  Visit Diagnosis: Acute pain of left knee  Stiffness of left knee, not elsewhere classified  Muscle weakness (generalized)  Localized edema     Problem List Patient Active Problem List   Diagnosis Date Noted   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 [place], [place], [address] Phone: [phone]   Fax:  [phone]  Name: Katie Weber MRN: [id number] Date of Birth: 1945/11/28

## 2021-04-01 ENCOUNTER — Encounter: Payer: Self-pay | Admitting: Rehabilitative and Restorative Service Providers"

## 2021-04-01 ENCOUNTER — Other Ambulatory Visit: Payer: Self-pay

## 2021-04-01 ENCOUNTER — Ambulatory Visit (INDEPENDENT_AMBULATORY_CARE_PROVIDER_SITE_OTHER): Payer: Medicare Other | Admitting: Rehabilitative and Restorative Service Providers"

## 2021-04-01 DIAGNOSIS — M6281 Muscle weakness (generalized): Secondary | ICD-10-CM

## 2021-04-01 DIAGNOSIS — R600 Localized edema: Secondary | ICD-10-CM

## 2021-04-01 DIAGNOSIS — M25562 Pain in left knee: Secondary | ICD-10-CM

## 2021-04-01 DIAGNOSIS — M25662 Stiffness of left knee, not elsewhere classified: Secondary | ICD-10-CM

## 2021-04-01 NOTE — Therapy (Addendum)
[facility] [address] [place], [place], [address] Phone: [phone]   Fax:  [phone]  Physical Therapy Treatment/Progress note  Patient Details  Name: Katie Weber MRN: [id number] Date of Birth: [birth year] Referring Provider (PT): Jean Rosenthal MD   Encounter Date: 04/01/2021 Progress Note Reporting Period 03/15/2021  to 04/01/2021  See note below for Objective Data and Assessment of Progress/Goals.       PT End of Session - 04/01/21 1425     Visit Number 5    Number of Visits 16    Date for PT Re-Evaluation 05/13/21    Progress Note Due on Visit 15    PT Start Time 1421    PT Stop Time 1501    PT Time Calculation (min) 40 min    Activity Tolerance Patient tolerated treatment well    Behavior During Therapy [facility] for tasks assessed/performed             Past Medical History:  Diagnosis Date   Anemia    when pt was younger   Arthritis    arthritis fingers   Asthma    Coronary artery calcification 04/24/2017   Diverticulitis    x2 -last 1'17 tx. outpatient.   Environmental and seasonal allergies    2014 Possibly related to air freshners.   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   Family history of anesthesia complication 19QQI ago   allergic reaction to atropine   GERD (gastroesophageal reflux disease)    no meds   History of hiatal hernia 09/14/2014   seen on xray   History of kidney stones    '80's lithotripsy-[facility] x1   History of kidney stones    x1   Hyperlipidemia    Hypothyroidism    Pneumonia 2014   Pure hypercholesterolemia    Tremor of left hand     Past Surgical History:  Procedure Laterality Date   BALLOON DILATION N/A 05/04/2014   Procedure: BALLOON DILATION;  Surgeon: Garlan Fair, MD;  Location: [facility];  Service: Endoscopy;  Laterality: N/A;   BREAST LUMPECTOMY Right 1972   benign    BREAST SURGERY Right     '72 -benign tumor   childbirth- NVD x2      COLONOSCOPY WITH PROPOFOL N/A 05/04/2014   Procedure: COLONOSCOPY WITH PROPOFOL;  Surgeon: Garlan Fair, MD;  Location: [facility];  Service: Endoscopy;  Laterality: N/A;   DILATION AND CURETTAGE OF UTERUS     04-20-14 [facility] endometrial polyp removed.   ESOPHAGOGASTRODUODENOSCOPY (EGD) WITH PROPOFOL N/A 05/04/2014   Procedure: ESOPHAGOGASTRODUODENOSCOPY (EGD) WITH PROPOFOL;  Surgeon: Garlan Fair, MD;  Location: [facility];  Service: Endoscopy;  Laterality: N/A;. Procedure done in [place], [place]- 5 yrs ago.   HYSTEROSCOPY WITH D & C N/A 04/20/2014   Procedure: DILATATION AND CURETTAGE /HYSTEROSCOPY;  Surgeon: [name], MD;  Location: [facility];  Service: Gynecology;  Laterality: N/A;   LITHOTRIPSY     POLYPECTOMY  2020   Dr. Lear Ng   TOTAL KNEE ARTHROPLASTY Left 02/15/2021   Procedure: LEFT TOTAL KNEE ARTHROPLASTY;  Surgeon: Mcarthur Rossetti, MD;  Location: [facility];  Service: Orthopedics;  Laterality: Left;   TUBAL LIGATION      There were no vitals filed for this visit.   Subjective Assessment - 04/01/21 1424     Subjective Pt. indicated feeling a little pain and sore after some yard work.  Pertinent History arthritis, anemia, breast surgery, diverticulitis, GERD, kidney stones, hyperlipidemia, hypothyrodism, tremor left hand    Patient Stated Goals Walking around neighborhood, playing with grandchildren    Currently in Pain? Yes    Pain Score 4     Pain Location Knee    Pain Orientation Left;Medial    Pain Descriptors / Indicators Aching;Sore    Pain Type Surgical pain    Pain Onset More than a month ago    Pain Frequency Intermittent    Aggravating Factors  yard work activity    Pain Relieving Factors pain meds                [facility] PT Assessment - 04/01/21 0001       Assessment   Medical Diagnosis z96.652 left TKA    Referring Provider (PT) Jean Rosenthal MD       Observation/Other Assessments   Focus on Therapeutic Outcomes (FOTO)  update 72%      AROM   Left Knee Extension 0    Left Knee Flexion 123      Strength   Right/Left Knee Right    Right Knee Flexion 5/5    Right Knee Extension 5/5   47.9, 55.4 lb   Left Knee Flexion 5/5    Left Knee Extension 5/5   56.2, 56 lbs     Ambulation/Gait   Gait Comments Independent ambulation all times now                           [facility] Adult PT Treatment/Exercise - 04/01/21 0001       Knee/Hip Exercises: Stretches   Gastroc Stretch 30 seconds;3 reps;Both      Knee/Hip Exercises: Aerobic   Recumbent Bike Lvl 2 6 mins seat 4      Knee/Hip Exercises: Machines for Strengthening   Cybex Knee Extension 10# 3x10 slow eccentric control Lt only (two legs up)    Total Gym Leg Press single leg 3 x 10 50 lbs bilateral      Knee/Hip Exercises: Standing   Lateral Step Up Step Height: 4";2 sets;15 reps;Both   eccentric lowering     Knee/Hip Exercises: Seated   Sit to Sand 2 sets;10 reps;without UE support   slow eccentric lowering, 18 inch table                     PT Short Term Goals - 03/29/21 1432       PT SHORT TERM GOAL #1   Title Pt will be independent in her initial HEP.    Status On-going               PT Long Term Goals - 04/01/21 1446       PT LONG TERM GOAL #1   Title Pt will be independent in her advanced HEP.    Status On-going      PT LONG TERM GOAL #2   Title Pt will be able to get up and down off the floor with no pain reported.    Status On-going      PT LONG TERM GOAL #3   Title Pt will improve her FOTO to >/= 70    Status Achieved      PT LONG TERM GOAL #4   Title Pt will improve her Single Leg Stance to >/= 15 seconds on left LE.    Status On-going      PT LONG TERM  GOAL #5   Title Pt will be able to climb a flight of stairs with step over step pattern with no UE support    Status On-going      PT LONG TERM GOAL #6   Title Pt  will improve her left knee flexion to >/= 125 degrees.    Status On-going                   Plan - 04/01/21 1431     Clinical Impression Statement Mild tightness upon arrival that improved c repetitive movement as well as improved symptoms c movement.  Yard work standing and bending produced mild soreness complaints since performing but overall doing fairly well compared to evaluation.  Continued skilled PT services for strength, mobility and movement coordination improvements warranted.  Objective data updated to show good progress on strength, mobility and FOTO outcome reassessment.    Personal Factors and Comorbidities Comorbidity 3+    Comorbidities arthritis, anemia, breast surgery, diverticulitis, GERD, kidney stones, hyperlipidemia, hypothyrodism, tremor left hand    Examination-Activity Limitations Squat;Stairs;Stand    Examination-Participation Restrictions Community Activity;Other    Stability/Clinical Decision Making Stable/Uncomplicated    Rehab Potential Excellent    PT Frequency 2x / week    PT Duration 8 weeks    PT Treatment/Interventions ADLs/Self Care Home Management;Cryotherapy;Presenter, broadcasting;Therapeutic exercise;Therapeutic activities;Functional mobility training;Stair training;Gait training;Neuromuscular re-education;Patient/family education;Passive range of motion;Manual techniques;Dry needling;Taping;Vasopneumatic Device    PT Next Visit Plan Slow and steady progression on progressive resistance intervention, balance control.    PT Home Exercise Plan Access Code: [email or web address]  URL: [email or web address]  Date: 03/15/2021  Prepared by: Kearney Hard    Exercises  Seated Straight Leg Raise with Support - 2-3 x daily - 7 x weekly - 2 sets - 10 reps  Supine Bridge - 2-3 x daily - 7 x weekly - 2 sets - 10 reps  Prone Quadriceps Stretch with Strap - 2-3 x daily - 7 x weekly - 3 sets - 10 reps  Standing Hip  Abduction with Counter Support - 2 x daily - 7 x weekly - 2 sets - 10 reps  Heel rises with counter support - 2 x daily - 7 x weekly - 2 sets - 10 reps  Standing Single Leg Stance with Counter Support - 2 x daily - 7 x weekly    Consulted and Agree with Plan of Care Patient             Patient will benefit from skilled therapeutic intervention in order to improve the following deficits and impairments:  Pain, Decreased strength, Decreased balance, Impaired flexibility, Difficulty walking, Increased edema  Visit Diagnosis: Acute pain of left knee  Stiffness of left knee, not elsewhere classified  Muscle weakness (generalized)  Localized edema     Problem List Patient Active Problem List   Diagnosis Date Noted   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 [place], [place], [address] Phone: [phone]   Fax:  [phone]  Name: Esmae Donathan MRN: [id number] Date of Birth: 01/29/1946

## 2021-04-04 ENCOUNTER — Encounter: Payer: Self-pay | Admitting: Orthopaedic Surgery

## 2021-04-04 ENCOUNTER — Other Ambulatory Visit: Payer: Self-pay

## 2021-04-04 ENCOUNTER — Ambulatory Visit (INDEPENDENT_AMBULATORY_CARE_PROVIDER_SITE_OTHER): Payer: Medicare Other | Admitting: Rehabilitative and Restorative Service Providers"

## 2021-04-04 ENCOUNTER — Ambulatory Visit (INDEPENDENT_AMBULATORY_CARE_PROVIDER_SITE_OTHER): Payer: Medicare Other | Admitting: Orthopaedic Surgery

## 2021-04-04 ENCOUNTER — Encounter: Payer: Self-pay | Admitting: Rehabilitative and Restorative Service Providers"

## 2021-04-04 DIAGNOSIS — R600 Localized edema: Secondary | ICD-10-CM | POA: Diagnosis not present

## 2021-04-04 DIAGNOSIS — M6281 Muscle weakness (generalized): Secondary | ICD-10-CM

## 2021-04-04 DIAGNOSIS — M25662 Stiffness of left knee, not elsewhere classified: Secondary | ICD-10-CM | POA: Diagnosis not present

## 2021-04-04 DIAGNOSIS — M25562 Pain in left knee: Secondary | ICD-10-CM

## 2021-04-04 DIAGNOSIS — Z96652 Presence of left artificial knee joint: Secondary | ICD-10-CM

## 2021-04-04 NOTE — Therapy (Signed)
[facility] Therapy [address] [place], [place], [address] Phone: [phone]   Fax:  [phone]  Physical Therapy Treatment  Patient Details  Name: Katie Weber MRN: [id number] Date of Birth: 1946/07/15 Referring Provider (PT): Jean Rosenthal MD   Encounter Date: 04/04/2021   PT End of Session - 04/04/21 1352     Visit Number 6    Number of Visits 16    Date for PT Re-Evaluation 05/13/21    Progress Note Due on Visit 15    PT Start Time 1346    PT Stop Time 1425    PT Time Calculation (min) 39 min    Activity Tolerance Patient tolerated treatment well    Behavior During Therapy [facility] for tasks assessed/performed             Past Medical History:  Diagnosis Date   Anemia    when pt was younger   Arthritis    arthritis fingers   Asthma    Coronary artery calcification 04/24/2017   Diverticulitis    x2 -last 1'17 tx. outpatient.   Environmental and seasonal allergies    2014 Possibly related to air freshners.   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   Family history of anesthesia complication 27OZD ago   allergic reaction to atropine   GERD (gastroesophageal reflux disease)    no meds   History of hiatal hernia 09/14/2014   seen on xray   History of kidney stones    '80's lithotripsy-[facility] x1   History of kidney stones    x1   Hyperlipidemia    Hypothyroidism    Pneumonia 2014   Pure hypercholesterolemia    Tremor of left hand     Past Surgical History:  Procedure Laterality Date   BALLOON DILATION N/A 05/04/2014   Procedure: BALLOON DILATION;  Surgeon: Garlan Fair, MD;  [facility];  Service: Endoscopy;  Laterality: N/A;   BREAST LUMPECTOMY Right 1972   benign    BREAST SURGERY Right    '72 -benign tumor   childbirth- NVD x2      COLONOSCOPY WITH PROPOFOL N/A 05/04/2014   Procedure: COLONOSCOPY WITH PROPOFOL;  Surgeon: Garlan Fair, MD;  [facility];  Service: Endoscopy;  Laterality: N/A;   DILATION AND CURETTAGE OF UTERUS     04-20-14 [facility] hospital endometrial polyp removed.   ESOPHAGOGASTRODUODENOSCOPY (EGD) WITH PROPOFOL N/A 05/04/2014   Procedure: ESOPHAGOGASTRODUODENOSCOPY (EGD) WITH PROPOFOL;  Surgeon: Garlan Fair, MD;  [facility];  Service: Endoscopy;  Laterality: N/A;. Procedure done in [place], [place]- 5 yrs ago.   HYSTEROSCOPY WITH D & C N/A 04/20/2014   Procedure: DILATATION AND CURETTAGE /HYSTEROSCOPY;  Surgeon: [name], MD;  Location: [facility];  Service: Gynecology;  Laterality: N/A;   LITHOTRIPSY     POLYPECTOMY  2020   Dr. Lear Ng   TOTAL KNEE ARTHROPLASTY Left 02/15/2021   Procedure: LEFT TOTAL KNEE ARTHROPLASTY;  Surgeon: Mcarthur Rossetti, MD;  Location: [facility];  Service: Orthopedics;  Laterality: Left;   TUBAL LIGATION      There were no vitals filed for this visit.   Subjective Assessment - 04/04/21 1351     Subjective Pt. indicated good MD visit and indicated feeling Lt knee doing well.  Rt knee hurting some (plan for fall replacement)    Pertinent History arthritis, anemia, breast surgery, diverticulitis, GERD, kidney stones, hyperlipidemia, hypothyrodism,  tremor left hand    Patient Stated Goals Walking around neighborhood, playing with grandchildren    Currently in Pain? No/denies    Pain Score 0-No pain    Pain Onset More than a month ago                               [facility] Adult PT Treatment/Exercise - 04/04/21 0001       Neuro Re-ed    Neuro Re-ed Details  3 point slider x 6 bilateral,      Knee/Hip Exercises: Stretches   Gastroc Stretch 30 seconds;3 reps;Both   incline board     Knee/Hip Exercises: Aerobic   Recumbent Bike Lvl 3 10 mins seat 3      Knee/Hip Exercises: Machines for Strengthening   Cybex Knee Extension 10# 3x10 slow eccentric control Lt only (two legs up)    Cybex Knee Flexion single  leg Lt 3 x 10 15 lbs    Total Gym Leg Press single leg 3 x 10 50 lbs bilateral      Knee/Hip Exercises: Standing   Lateral Step Up Step Height: 6";15 reps;2 sets;Both                    PT Education - 04/04/21 1424     Education Details POC discussion (transition to 1x/week)    Person(s) Educated Patient    Methods Explanation    Comprehension Verbalized understanding              PT Short Term Goals - 04/04/21 1424       PT SHORT TERM GOAL #1   Title Pt will be independent in her initial HEP.    Status Achieved               PT Long Term Goals - 04/01/21 1446       PT LONG TERM GOAL #1   Title Pt will be independent in her advanced HEP.    Status On-going      PT LONG TERM GOAL #2   Title Pt will be able to get up and down off the floor with no pain reported.    Status On-going      PT LONG TERM GOAL #3   Title Pt will improve her FOTO to >/= 70    Status Achieved      PT LONG TERM GOAL #4   Title Pt will improve her Single Leg Stance to >/= 15 seconds on left LE.    Status On-going      PT LONG TERM GOAL #5   Title Pt will be able to climb a flight of stairs with step over step pattern with no UE support    Status On-going      PT LONG TERM GOAL #6   Title Pt will improve her left knee flexion to >/= 125 degrees.    Status On-going                   Plan - 04/04/21 1415     Clinical Impression Statement Pt. has continued to make good progress in symptom relief, mobilty gains and strength gains, with Lt leg improvement starting to be greater than Rt leg presentation(also limited by knee pain).  Pt. to benefit from dynamic stability and movement coordination improvements c quad strength gains.  Overall progress indicated Pt. acceptable for reduced frequency to 1x/week  Personal Factors and Comorbidities Comorbidity 3+    Comorbidities arthritis, anemia, breast surgery, diverticulitis, GERD, kidney stones, hyperlipidemia,  hypothyrodism, tremor left hand    Examination-Activity Limitations Squat;Stairs;Stand    Examination-Participation Restrictions Community Activity;Other    Stability/Clinical Decision Making Stable/Uncomplicated    Rehab Potential Excellent    PT Frequency 1x / week    PT Duration 8 weeks    PT Treatment/Interventions ADLs/Self Care Home Management;Cryotherapy;Presenter, broadcasting;Therapeutic exercise;Therapeutic activities;Functional mobility training;Stair training;Gait training;Neuromuscular re-education;Patient/family education;Passive range of motion;Manual techniques;Dry needling;Taping;Vasopneumatic Device    PT Next Visit Plan progression on progressive resistance intervention, balance control with transition to 1x/week    PT Home Exercise Plan Access Code: [email or web address]  URL: [email or web address]  Date: 03/15/2021  Prepared by: Kearney Hard    Exercises  Seated Straight Leg Raise with Support - 2-3 x daily - 7 x weekly - 2 sets - 10 reps  Supine Bridge - 2-3 x daily - 7 x weekly - 2 sets - 10 reps  Prone Quadriceps Stretch with Strap - 2-3 x daily - 7 x weekly - 3 sets - 10 reps  Standing Hip Abduction with Counter Support - 2 x daily - 7 x weekly - 2 sets - 10 reps  Heel rises with counter support - 2 x daily - 7 x weekly - 2 sets - 10 reps  Standing Single Leg Stance with Counter Support - 2 x daily - 7 x weekly    Consulted and Agree with Plan of Care Patient             Patient will benefit from skilled therapeutic intervention in order to improve the following deficits and impairments:  Pain, Decreased strength, Decreased balance, Impaired flexibility, Difficulty walking, Increased edema  Visit Diagnosis: Acute pain of left knee  Stiffness of left knee, not elsewhere classified  Muscle weakness (generalized)  Localized edema     Problem List Patient Active Problem List   Diagnosis Date Noted   Status post total  left knee replacement 02/15/2021   Unilateral primary osteoarthritis, left knee 01/04/2021   Coronary artery calcification 04/24/2017   Hyperlipidemia 04/24/2017   Acute bronchitis 09/29/2014   SBO (small bowel obstruction) ([place]) 09/27/2014   Nausea and vomiting 09/27/2014   Hypothyroidism 09/27/2014   Leukocytosis 09/27/2014   Scot Jun, PT, DPT, OCS, ATC 04/04/21  2:25 PM    [facility] [address] [place], [place], [address] Phone: [phone]   Fax:  [phone]  Name: Katie Weber MRN: [id number] Date of Birth: 10/21/1945

## 2021-04-04 NOTE — Progress Notes (Signed)
The patient is now 6 weeks status post a left total knee arthroplasty.  She is definitely 60 recommendations in terms of her getting her motion back.  Her motion today is almost full and she is doing great.  Her knee is straight on exam with the left knee.  Her right knee does have known varus malalignment and she is hoping for [place] to replace her right knee in the fall of this year.  She has been diligent with her home therapy and outpatient physical therapy here and has done well with that.  She is very pleased with her therapist.  The right knee has varus malalignment and patellofemoral crepitation with medial joint line tenderness.  The left operative knee shows well-healed surgical incisions.  There is minimal swelling and warmth which is to be expected.  The knee is ligamentously stable with excellent range of motion.  At this point she will continue increase her activities as comfort allows.  We will see her back at the end of August.  At that visit I like a standing AP and lateral both knees.  We will work at that point on getting her scheduled for a right total knee arthroplasty.

## 2021-04-07 ENCOUNTER — Encounter: Payer: Medicare Other | Admitting: Rehabilitative and Restorative Service Providers"

## 2021-04-08 ENCOUNTER — Encounter: Payer: Medicare Other | Admitting: Rehabilitative and Restorative Service Providers"

## 2021-04-12 ENCOUNTER — Ambulatory Visit (INDEPENDENT_AMBULATORY_CARE_PROVIDER_SITE_OTHER): Payer: Medicare Other | Admitting: Physical Therapy

## 2021-04-12 ENCOUNTER — Other Ambulatory Visit: Payer: Self-pay

## 2021-04-12 ENCOUNTER — Encounter: Payer: Self-pay | Admitting: Physical Therapy

## 2021-04-12 DIAGNOSIS — M6281 Muscle weakness (generalized): Secondary | ICD-10-CM

## 2021-04-12 DIAGNOSIS — R600 Localized edema: Secondary | ICD-10-CM | POA: Diagnosis not present

## 2021-04-12 DIAGNOSIS — M25562 Pain in left knee: Secondary | ICD-10-CM | POA: Diagnosis not present

## 2021-04-12 DIAGNOSIS — M25662 Stiffness of left knee, not elsewhere classified: Secondary | ICD-10-CM | POA: Diagnosis not present

## 2021-04-12 NOTE — Therapy (Signed)
[facility] [address] [place], [place], [address] Phone: [phone]   Fax:  [phone]  Physical Therapy Treatment  Patient Details  Name: Katie Weber MRN: [id number] Date of Birth: 1946-01-15 Referring Provider (PT): Jean Rosenthal MD   Encounter Date: 04/12/2021   PT End of Session - 04/12/21 1544     Visit Number 7    Number of Visits 16    Date for PT Re-Evaluation 05/13/21    Progress Note Due on Visit 15    PT Start Time 1515    PT Stop Time 1553    PT Time Calculation (min) 38 min    Activity Tolerance Patient tolerated treatment well    Behavior During Therapy [facility] for tasks assessed/performed             Past Medical History:  Diagnosis Date   Anemia    when pt was younger   Arthritis    arthritis fingers   Asthma    Coronary artery calcification 04/24/2017   Diverticulitis    x2 -last 1'17 tx. outpatient.   Environmental and seasonal allergies    2014 Possibly related to air freshners.   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   Family history of anesthesia complication 02VOZ ago   allergic reaction to atropine   GERD (gastroesophageal reflux disease)    no meds   History of hiatal hernia 09/14/2014   seen on xray   History of kidney stones    '80's lithotripsy-[facility] x1   History of kidney stones    x1   Hyperlipidemia    Hypothyroidism    Pneumonia 2014   Pure hypercholesterolemia    Tremor of left hand     Past Surgical History:  Procedure Laterality Date   BALLOON DILATION N/A 05/04/2014   Procedure: BALLOON DILATION;  Surgeon: Garlan Fair, MD;  Location: [facility];  Service: Endoscopy;  Laterality: N/A;   BREAST LUMPECTOMY Right 1972   benign    BREAST SURGERY Right    '72 -benign tumor   childbirth- NVD x2      COLONOSCOPY WITH PROPOFOL N/A 05/04/2014   Procedure: COLONOSCOPY WITH PROPOFOL;  Surgeon: Garlan Fair, MD;  [facility];  Service: Endoscopy;  Laterality: N/A;   DILATION AND CURETTAGE OF UTERUS     04-20-14 [facility] hospital endometrial polyp removed.   ESOPHAGOGASTRODUODENOSCOPY (EGD) WITH PROPOFOL N/A 05/04/2014   Procedure: ESOPHAGOGASTRODUODENOSCOPY (EGD) WITH PROPOFOL;  Surgeon: Garlan Fair, MD;  [facility];  Service: Endoscopy;  Laterality: N/A;. Procedure done in [place], [place]- 5 yrs ago.   HYSTEROSCOPY WITH D & C N/A 04/20/2014   Procedure: DILATATION AND CURETTAGE /HYSTEROSCOPY;  Surgeon: [name], MD;  Location: [facility];  Service: Gynecology;  Laterality: N/A;   LITHOTRIPSY     POLYPECTOMY  2020   Dr. Lear Ng   TOTAL KNEE ARTHROPLASTY Left 02/15/2021   Procedure: LEFT TOTAL KNEE ARTHROPLASTY;  Surgeon: Mcarthur Rossetti, MD;  Location: [facility];  Service: Orthopedics;  Laterality: Left;   TUBAL LIGATION      There were no vitals filed for this visit.   Subjective Assessment - 04/12/21 1542     Subjective Pt arriving today with no pain.  Pt reporting less  compliance with her HEP this week due to having a cat in medical distress. Pt has been walking more and reporting she is going to  schedule her right knee replacement in October.    Pertinent History arthritis, anemia, breast surgery, diverticulitis, GERD, kidney stones, hyperlipidemia, hypothyrodism, tremor left hand    Patient Stated Goals Walking around neighborhood, playing with grandchildren    Currently in Pain? No/denies                [facility] PT Assessment - 04/12/21 0001       Assessment   Medical Diagnosis z96.652 left TKA    Referring Provider (PT) Jean Rosenthal MD      AROM   Left Knee Extension 0    Left Knee Flexion 124                           [facility] Adult PT Treatment/Exercise - 04/12/21 0001       Neuro Re-ed    Neuro Re-ed Details  3 point slider x 6 bilateral,      Exercises   Exercises Knee/Hip      Knee/Hip  Exercises: Stretches   Gastroc Stretch 30 seconds;3 reps;Both   incline board     Knee/Hip Exercises: Aerobic   Recumbent Bike Lvl 3 10 mins seat 3      Knee/Hip Exercises: Machines for Strengthening   Cybex Knee Extension 10# 3x10 slow eccentric control Lt only (two legs up)    Cybex Knee Flexion single leg Lt 3 x 10 15 lbs    Total Gym Leg Press single leg 3 x 10 56 lbs bilateral      Knee/Hip Exercises: Standing   Lateral Step Up Step Height: 6";15 reps;2 sets;Both    Functional Squat 10 reps    Functional Squat Limitations at sink with UE support    Other Standing Knee Exercises TRX: x15  deep squats                      PT Short Term Goals - 04/04/21 1424       PT SHORT TERM GOAL #1   Title Pt will be independent in her initial HEP.    Status Achieved               PT Long Term Goals - 04/12/21 1550       PT LONG TERM GOAL #1   Title Pt will be independent in her advanced HEP.    Status On-going      PT LONG TERM GOAL #2   Title Pt will be able to get up and down off the floor with no pain reported.    Status On-going      PT LONG TERM GOAL #3   Title Pt will improve her FOTO to >/= 70    Baseline 72%    Status Achieved      PT LONG TERM GOAL #4   Title Pt will improve her Single Leg Stance to >/= 15 seconds on left LE.    Status On-going      PT LONG TERM GOAL #5   Title Pt will be able to climb a flight of stairs with step over step pattern with no UE support    Status On-going      PT LONG TERM GOAL #6   Title Pt will improve her left knee flexion to >/= 125 degrees.    Status On-going                   Plan - 04/12/21 6629  Clinical Impression Statement Pt is making great progress with AROM measured today to 124 degrees left knee flexion. Next visit pt will need to practice getting up and down off the floor. Continue to progress with strengtheing and dynamic balance.    Personal Factors and Comorbidities Comorbidity 3+     Comorbidities arthritis, anemia, breast surgery, diverticulitis, GERD, kidney stones, hyperlipidemia, hypothyrodism, tremor left hand    Examination-Activity Limitations Squat;Stairs;Stand    Examination-Participation Restrictions Community Activity;Other    Stability/Clinical Decision Making Stable/Uncomplicated    Rehab Potential Excellent    PT Frequency 1x / week    PT Duration 8 weeks    PT Treatment/Interventions ADLs/Self Care Home Management;Cryotherapy;Presenter, broadcasting;Therapeutic exercise;Therapeutic activities;Functional mobility training;Stair training;Gait training;Neuromuscular re-education;Patient/family education;Passive range of motion;Manual techniques;Dry needling;Taping;Vasopneumatic Device    PT Next Visit Plan prative getting on/off floor,   progression on progressive resistance intervention, balance control with transition to 1x/week    PT Home Exercise Plan Access Code: [email or web address]  URL: [email or web address]  Date: 03/15/2021  Prepared by: Kearney Hard    Exercises  Seated Straight Leg Raise with Support - 2-3 x daily - 7 x weekly - 2 sets - 10 reps  Supine Bridge - 2-3 x daily - 7 x weekly - 2 sets - 10 reps  Prone Quadriceps Stretch with Strap - 2-3 x daily - 7 x weekly - 3 sets - 10 reps  Standing Hip Abduction with Counter Support - 2 x daily - 7 x weekly - 2 sets - 10 reps  Heel rises with counter support - 2 x daily - 7 x weekly - 2 sets - 10 reps  Standing Single Leg Stance with Counter Support - 2 x daily - 7 x weekly    Consulted and Agree with Plan of Care Patient             Patient will benefit from skilled therapeutic intervention in order to improve the following deficits and impairments:  Pain, Decreased strength, Decreased balance, Impaired flexibility, Difficulty walking, Increased edema  Visit Diagnosis: Acute pain of left knee  Stiffness of left knee, not elsewhere classified  Muscle  weakness (generalized)  Localized edema     Problem List Patient Active Problem List   Diagnosis Date Noted   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

## 2021-04-15 ENCOUNTER — Encounter: Payer: Medicare Other | Admitting: Rehabilitative and Restorative Service Providers"

## 2021-04-19 ENCOUNTER — Other Ambulatory Visit: Payer: Self-pay | Admitting: Orthopaedic Surgery

## 2021-04-20 NOTE — Telephone Encounter (Signed)
ok 

## 2021-04-21 ENCOUNTER — Ambulatory Visit (INDEPENDENT_AMBULATORY_CARE_PROVIDER_SITE_OTHER): Payer: Medicare Other | Admitting: Physical Therapy

## 2021-04-21 ENCOUNTER — Other Ambulatory Visit: Payer: Self-pay

## 2021-04-21 DIAGNOSIS — M6281 Muscle weakness (generalized): Secondary | ICD-10-CM

## 2021-04-21 DIAGNOSIS — M25562 Pain in left knee: Secondary | ICD-10-CM | POA: Diagnosis not present

## 2021-04-21 DIAGNOSIS — M25662 Stiffness of left knee, not elsewhere classified: Secondary | ICD-10-CM | POA: Diagnosis not present

## 2021-04-21 DIAGNOSIS — R600 Localized edema: Secondary | ICD-10-CM

## 2021-04-21 NOTE — Therapy (Addendum)
[facility] [address] [place], [place], [address] Phone: [phone]   Fax:  [phone]  Physical Therapy Treatment /Discharge  Patient Details  Name: Katie Weber MRN: [id number] Date of Birth: Nov 05, 1945 Referring Provider (PT): Jean Rosenthal MD   Encounter Date: 04/21/2021   PT End of Session - 04/21/21 1609     Visit Number 8    Number of Visits 16    Date for PT Re-Evaluation 05/13/21    Progress Note Due on Visit 15    PT Start Time 1515    PT Stop Time 1600    PT Time Calculation (min) 45 min    Activity Tolerance Patient tolerated treatment well    Behavior During Therapy [facility] for tasks assessed/performed             Past Medical History:  Diagnosis Date   Anemia    when pt was younger   Arthritis    arthritis fingers   Asthma    Coronary artery calcification 04/24/2017   Diverticulitis    x2 -last 1'17 tx. outpatient.   Environmental and seasonal allergies    2014 Possibly related to air freshners.   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   Family history of anesthesia complication 76OTL ago   allergic reaction to atropine   GERD (gastroesophageal reflux disease)    no meds   History of hiatal hernia 09/14/2014   seen on xray   History of kidney stones    '80's lithotripsy-[facility] x1   History of kidney stones    x1   Hyperlipidemia    Hypothyroidism    Pneumonia 2014   Pure hypercholesterolemia    Tremor of left hand     Past Surgical History:  Procedure Laterality Date   BALLOON DILATION N/A 05/04/2014   Procedure: BALLOON DILATION;  Surgeon: Garlan Fair, MD;  [facility];  Service: Endoscopy;  Laterality: N/A;   BREAST LUMPECTOMY Right 1972   benign    BREAST SURGERY Right    '72 -benign tumor   childbirth- NVD x2      COLONOSCOPY WITH PROPOFOL N/A 05/04/2014   Procedure: COLONOSCOPY WITH PROPOFOL;   Surgeon: Garlan Fair, MD;  [facility];  Service: Endoscopy;  Laterality: N/A;   DILATION AND CURETTAGE OF UTERUS     04-20-14 [facility] hospital endometrial polyp removed.   ESOPHAGOGASTRODUODENOSCOPY (EGD) WITH PROPOFOL N/A 05/04/2014   Procedure: ESOPHAGOGASTRODUODENOSCOPY (EGD) WITH PROPOFOL;  Surgeon: Garlan Fair, MD;  [facility];  Service: Endoscopy;  Laterality: N/A;. Procedure done in [place], [place]- 5 yrs ago.   HYSTEROSCOPY WITH D & C N/A 04/20/2014   Procedure: DILATATION AND CURETTAGE /HYSTEROSCOPY;  Surgeon: [name], MD;  Location: [facility];  Service: Gynecology;  Laterality: N/A;   LITHOTRIPSY     POLYPECTOMY  2020   Dr. Lear Ng   TOTAL KNEE ARTHROPLASTY Left 02/15/2021   Procedure: LEFT TOTAL KNEE ARTHROPLASTY;  Surgeon: Mcarthur Rossetti, MD;  Location: [facility];  Service: Orthopedics;  Laterality: Left;   TUBAL LIGATION      There were no vitals filed for this visit.   Subjective Assessment - 04/21/21 1606     Subjective Denies pain today, some minor stiffness in her knee, she is concerned about getting up and down from floor    Pertinent History arthritis, anemia, breast surgery, diverticulitis, GERD, kidney stones, hyperlipidemia, hypothyrodism, tremor  left hand    Patient Stated Goals Walking around neighborhood, playing with grandchildren    Pain Onset More than a month ago              [facility] Adult PT Treatment/Exercise - 04/21/21 0001       Therapeutic Activites    Therapeutic Activities Other Therapeutic Activities    Other Therapeutic Activities worked on getting up/down from floor with cues and demo for technique, 4 reps performed      Neuro Re-ed    Neuro Re-ed Details  tandem balance 20 sec X 2 bilat, fwd walking on foam beam 4 round trips      Knee/Hip Exercises: Aerobic   Nustep L6 X 6 min      Knee/Hip Exercises: Machines for Strengthening   Cybex Knee Extension 10# 3X10 up with both, down with Lt     Cybex Knee Flexion single leg Lt 3 x 10 20 lbs,  then 35 lbs DL 2X10    Total Gym Leg Press single leg 3 x 10 62# lbs, then 100 lbs DL 3X10      Knee/Hip Exercises: Standing   Other Standing Knee Exercises TRX squats 2X10                      PT Short Term Goals - 04/04/21 1424       PT SHORT TERM GOAL #1   Title Pt will be independent in her initial HEP.    Status Achieved               PT Long Term Goals - 04/12/21 1550       PT LONG TERM GOAL #1   Title Pt will be independent in her advanced HEP.    Status On-going      PT LONG TERM GOAL #2   Title Pt will be able to get up and down off the floor with no pain reported.    Status On-going      PT LONG TERM GOAL #3   Title Pt will improve her FOTO to >/= 70    Baseline 72%    Status Achieved      PT LONG TERM GOAL #4   Title Pt will improve her Single Leg Stance to >/= 15 seconds on left LE.    Status On-going      PT LONG TERM GOAL #5   Title Pt will be able to climb a flight of stairs with step over step pattern with no UE support    Status On-going      PT LONG TERM GOAL #6   Title Pt will improve her left knee flexion to >/= 125 degrees.    Status On-going                   Plan - 04/21/21 1613     Clinical Impression Statement She is overall doing excellent and was able to get up/down from floor after education for proper technique. She has one more visit scheduled and will likely be ready for discharge then as long as she feels confident and is pleased with her functional level.    Personal Factors and Comorbidities Comorbidity 3+    Comorbidities arthritis, anemia, breast surgery, diverticulitis, GERD, kidney stones, hyperlipidemia, hypothyrodism, tremor left hand    Examination-Activity Limitations Squat;Stairs;Stand    Examination-Participation Restrictions Community Activity;Other    Stability/Clinical Decision Making Stable/Uncomplicated    Rehab Potential Excellent  PT Frequency 1x / week    PT Duration 8 weeks    PT Treatment/Interventions ADLs/Self Care Home Management;Cryotherapy;Presenter, broadcasting;Therapeutic exercise;Therapeutic activities;Functional mobility training;Stair training;Gait training;Neuromuscular re-education;Patient/family education;Passive range of motion;Manual techniques;Dry needling;Taping;Vasopneumatic Device    PT Next Visit Plan likely DC and final HEP    PT Home Exercise Plan Access Code: [email or web address]  URL: [email or web address]  Date: 03/15/2021  Prepared by: Kearney Hard    Exercises  Seated Straight Leg Raise with Support - 2-3 x daily - 7 x weekly - 2 sets - 10 reps  Supine Bridge - 2-3 x daily - 7 x weekly - 2 sets - 10 reps  Prone Quadriceps Stretch with Strap - 2-3 x daily - 7 x weekly - 3 sets - 10 reps  Standing Hip Abduction with Counter Support - 2 x daily - 7 x weekly - 2 sets - 10 reps  Heel rises with counter support - 2 x daily - 7 x weekly - 2 sets - 10 reps  Standing Single Leg Stance with Counter Support - 2 x daily - 7 x weekly    Consulted and Agree with Plan of Care Patient             Patient will benefit from skilled therapeutic intervention in order to improve the following deficits and impairments:  Pain, Decreased strength, Decreased balance, Impaired flexibility, Difficulty walking, Increased edema  Visit Diagnosis: Acute pain of left knee  Stiffness of left knee, not elsewhere classified  Muscle weakness (generalized)  Localized edema     Problem List Patient Active Problem List   Diagnosis Date Noted   Status post total left knee replacement 02/15/2021   Unilateral primary osteoarthritis, left knee 01/04/2021   Coronary artery calcification 04/24/2017   Hyperlipidemia 04/24/2017   Acute bronchitis 09/29/2014   SBO (small bowel obstruction) ([place]) 09/27/2014   Nausea and vomiting 09/27/2014   Hypothyroidism 09/27/2014    Leukocytosis 09/27/2014    Marrianne Mood Nas Wafer,PT,DPT 04/21/2021, 4:15 PM  PHYSICAL THERAPY DISCHARGE SUMMARY  Visits from Start of Care: 8  Current functional level related to goals / functional outcomes: See note   Remaining deficits: See note   Education / Equipment: HEP   Patient agrees to discharge. Patient goals were partially met. Patient is being discharged due to not returning since the last visit.  Scot Jun, PT, DPT, OCS, ATC 07/07/21  9:10 AM     [facility] [address] [place], [place], [address] Phone: [phone]   Fax:  [phone]  Name: Breanah Faddis MRN: [id number] Date of Birth: Apr 18, 1946

## 2021-04-25 ENCOUNTER — Other Ambulatory Visit: Payer: Self-pay

## 2021-04-25 ENCOUNTER — Emergency Department (HOSPITAL_COMMUNITY): Payer: Medicare Other

## 2021-04-25 ENCOUNTER — Emergency Department (HOSPITAL_COMMUNITY)
Admission: EM | Admit: 2021-04-25 | Discharge: 2021-04-26 | Disposition: A | Discharge status: Home / Self Care | Payer: Medicare Other | Attending: Emergency Medicine | Admitting: Emergency Medicine

## 2021-04-25 ENCOUNTER — Encounter (HOSPITAL_COMMUNITY): Payer: Self-pay | Admitting: Emergency Medicine

## 2021-04-25 DIAGNOSIS — Z79899 Other long term (current) drug therapy: Secondary | ICD-10-CM | POA: Diagnosis not present

## 2021-04-25 DIAGNOSIS — Z7982 Long term (current) use of aspirin: Secondary | ICD-10-CM | POA: Diagnosis not present

## 2021-04-25 DIAGNOSIS — K5732 Diverticulitis of large intestine without perforation or abscess without bleeding: Secondary | ICD-10-CM | POA: Insufficient documentation

## 2021-04-25 DIAGNOSIS — R1084 Generalized abdominal pain: Secondary | ICD-10-CM | POA: Diagnosis present

## 2021-04-25 DIAGNOSIS — M549 Dorsalgia, unspecified: Secondary | ICD-10-CM | POA: Insufficient documentation

## 2021-04-25 DIAGNOSIS — K219 Gastro-esophageal reflux disease without esophagitis: Secondary | ICD-10-CM | POA: Insufficient documentation

## 2021-04-25 DIAGNOSIS — K5792 Diverticulitis of intestine, part unspecified, without perforation or abscess without bleeding: Secondary | ICD-10-CM

## 2021-04-25 DIAGNOSIS — Z96652 Presence of left artificial knee joint: Secondary | ICD-10-CM | POA: Diagnosis not present

## 2021-04-25 DIAGNOSIS — E039 Hypothyroidism, unspecified: Secondary | ICD-10-CM | POA: Insufficient documentation

## 2021-04-25 DIAGNOSIS — J45909 Unspecified asthma, uncomplicated: Secondary | ICD-10-CM | POA: Insufficient documentation

## 2021-04-25 LAB — URINALYSIS, ROUTINE W REFLEX MICROSCOPIC
Bilirubin Urine: NEGATIVE
Glucose, UA: NEGATIVE mg/dL
Ketones, ur: NEGATIVE mg/dL
Nitrite: NEGATIVE
Protein, ur: NEGATIVE mg/dL
Specific Gravity, Urine: 1.028 (ref 1.005–1.030)
pH: 5 (ref 5.0–8.0)

## 2021-04-25 LAB — COMPREHENSIVE METABOLIC PANEL
ALT: 18 U/L (ref 0–44)
AST: 22 U/L (ref 15–41)
Albumin: 3.5 g/dL (ref 3.5–5.0)
Alkaline Phosphatase: 73 U/L (ref 38–126)
Anion gap: 11 (ref 5–15)
BUN: 8 mg/dL (ref 8–23)
CO2: 18 mmol/L — ABNORMAL LOW (ref 22–32)
Calcium: 9.3 mg/dL (ref 8.9–10.3)
Chloride: 107 mmol/L (ref 98–111)
Creatinine, Ser: 0.78 mg/dL (ref 0.44–1.00)
GFR, Estimated: >60 mL/min (ref >60)
Glucose, Bld: 118 mg/dL — ABNORMAL HIGH (ref 70–99)
Potassium: 3.7 mmol/L (ref 3.5–5.1)
Sodium: 136 mmol/L (ref 135–145)
Total Bilirubin: 0.8 mg/dL (ref 0.3–1.2)
Total Protein: 7.3 g/dL (ref 6.5–8.1)

## 2021-04-25 LAB — CBC WITH DIFFERENTIAL/PLATELET
Abs Immature Granulocytes: 0.05 10*3/uL (ref 0.00–0.07)
Basophils Absolute: 0.1 10*3/uL (ref 0.0–0.1)
Basophils Relative: 0 %
Eosinophils Absolute: 0.1 10*3/uL (ref 0.0–0.5)
Eosinophils Relative: 1 %
HCT: 38.6 % (ref 36.0–46.0)
Hemoglobin: 11.9 g/dL — ABNORMAL LOW (ref 12.0–15.0)
Immature Granulocytes: 0 %
Lymphocytes Relative: 12 %
Lymphs Abs: 1.6 10*3/uL (ref 0.7–4.0)
MCH: 26.6 pg (ref 26.0–34.0)
MCHC: 30.8 g/dL (ref 30.0–36.0)
MCV: 86.2 fL (ref 80.0–100.0)
Monocytes Absolute: 1.1 10*3/uL — ABNORMAL HIGH (ref 0.1–1.0)
Monocytes Relative: 8 %
Neutro Abs: 10.7 10*3/uL — ABNORMAL HIGH (ref 1.7–7.7)
Neutrophils Relative %: 79 %
Platelets: 309 10*3/uL (ref 150–400)
RBC: 4.48 MIL/uL (ref 3.87–5.11)
RDW: 14.3 % (ref 11.5–15.5)
WBC: 13.7 10*3/uL — ABNORMAL HIGH (ref 4.0–10.5)
nRBC: 0 % (ref 0.0–0.2)

## 2021-04-25 LAB — LIPASE, BLOOD: Lipase: 36 U/L (ref 11–51)

## 2021-04-25 LAB — LACTIC ACID, PLASMA: Lactic Acid, Venous: 1.5 mmol/L (ref 0.5–1.9)

## 2021-04-25 IMAGING — CT CT ABD-PELV W/O CM
2 of 4 series · 16 of 46 positions shown, 18 images · non-contrast
Comparison: CT [DATE]

CLINICAL DATA: Fever and abdominal pain. Lower abdominal and back
pain for 3 days.

EXAM:
CT ABDOMEN AND PELVIS WITHOUT CONTRAST
TECHNIQUE: Multidetector CT imaging of the abdomen and pelvis was performed
following the standard protocol without IV contrast.

[Series 3: a/p w/o 5mm · axial · non-contrast · 0.81mm/px · z∈[-432,-58]mm · 13 of 83 slices shown, 15 images]
[im 4/83  soft-tissue]
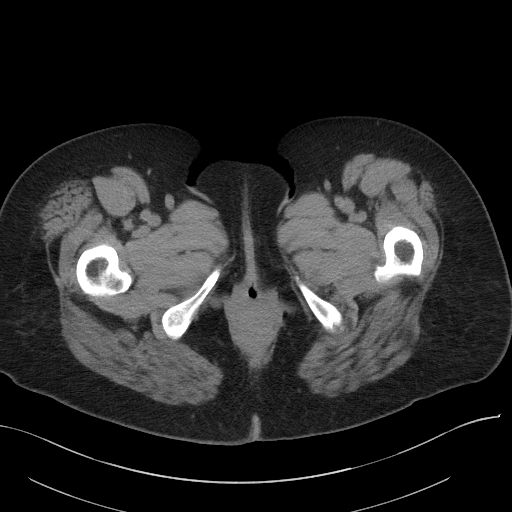
[im 4/83  bone]
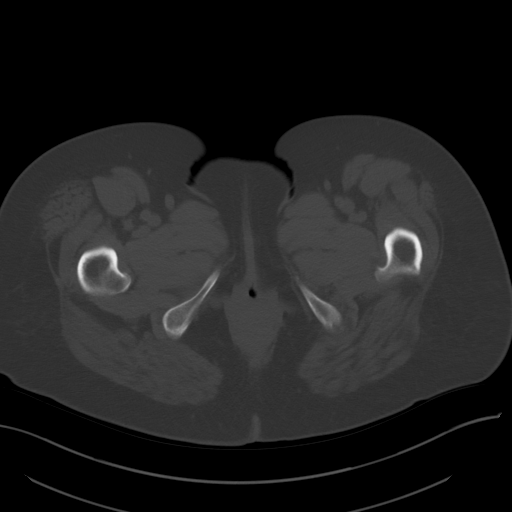
[im 11/83  soft-tissue]
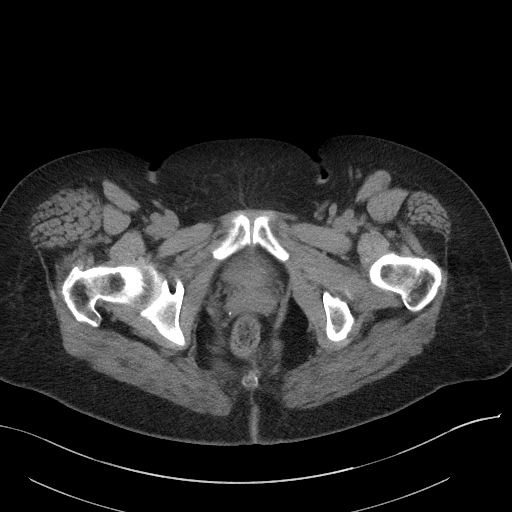
[im 18/83  soft-tissue]
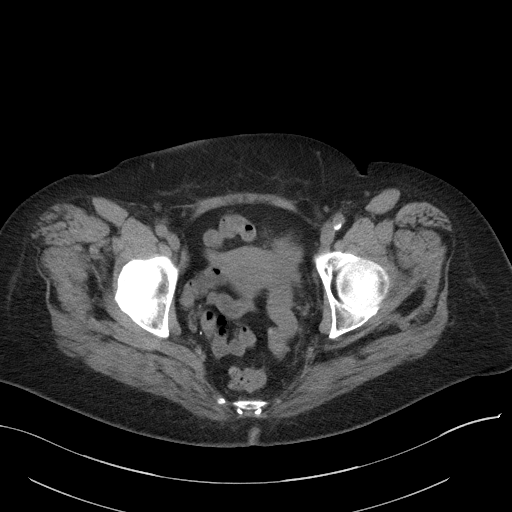
[im 24/83  soft-tissue]
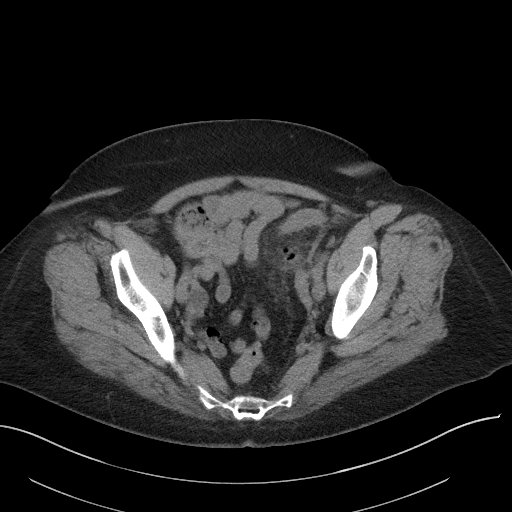
[im 28/83  soft-tissue]
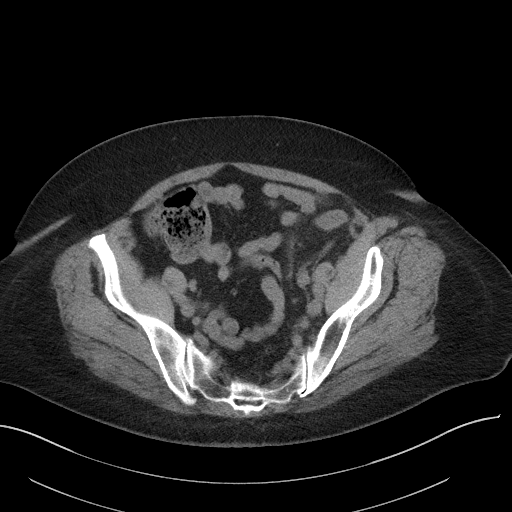
[im 35/83  soft-tissue]
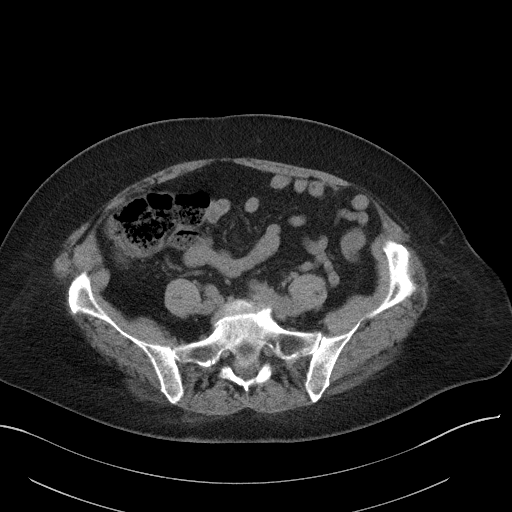
[im 42/83  soft-tissue]
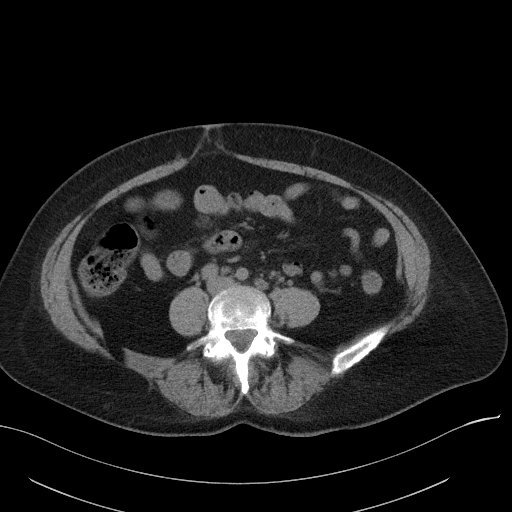
[im 48/83  soft-tissue]
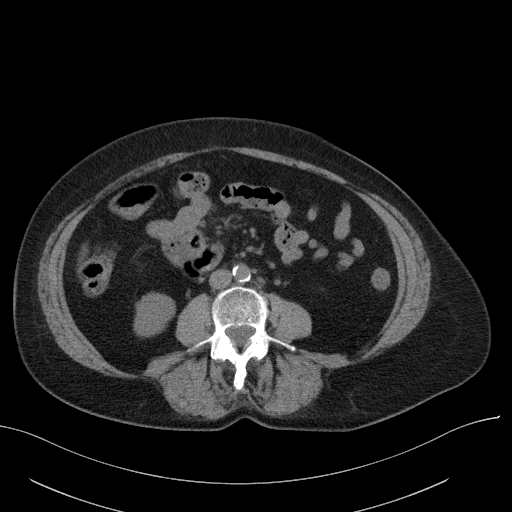
[im 55/83  soft-tissue]
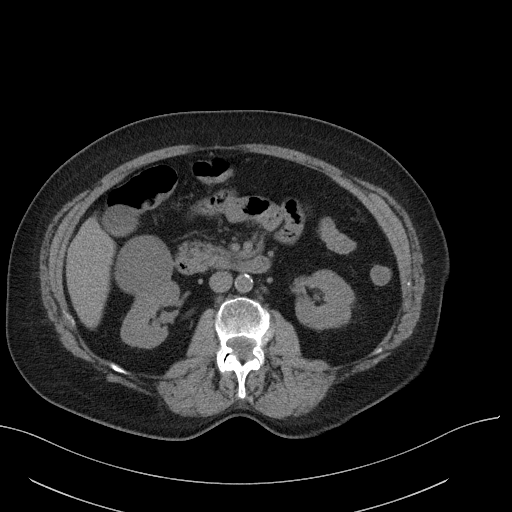
[im 55/83  bone]
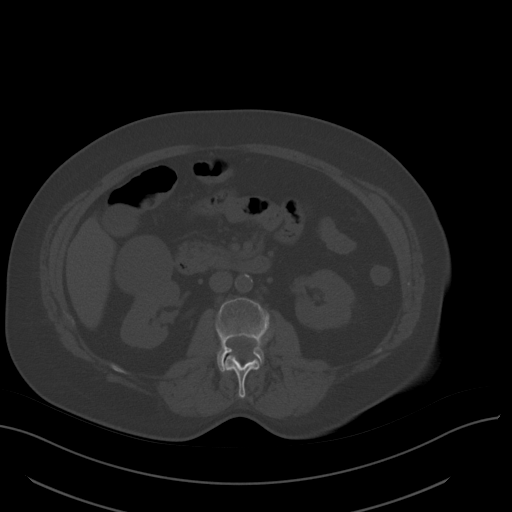
[im 59/83  soft-tissue]
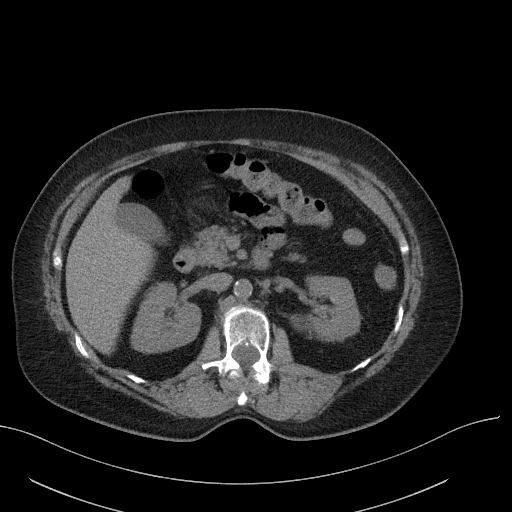
[im 65/83  soft-tissue]
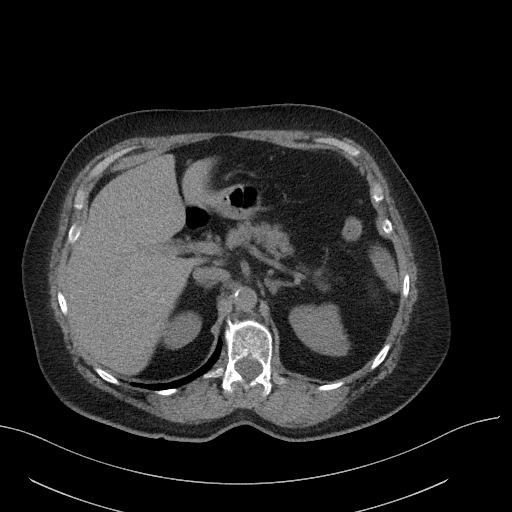
[im 72/83  soft-tissue]
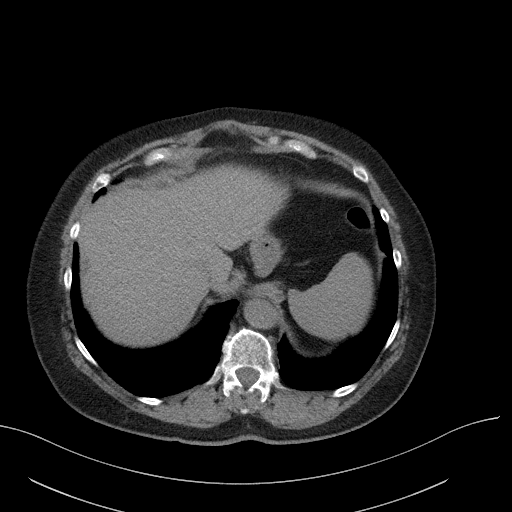
[im 79/83  soft-tissue]
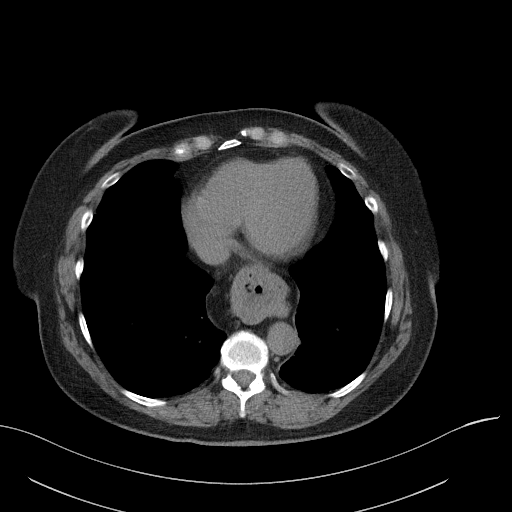

[Series 6: a/p w/o cor · coronal · non-contrast · 0.81mm/px · 3 of 150 slices shown]
[im 50/150  soft-tissue]
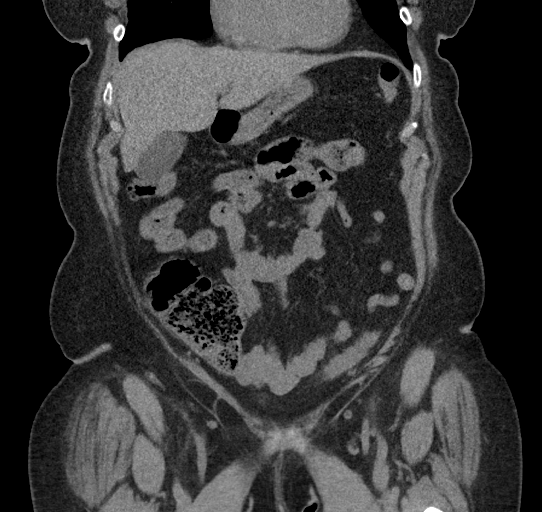
[im 67/150  soft-tissue]
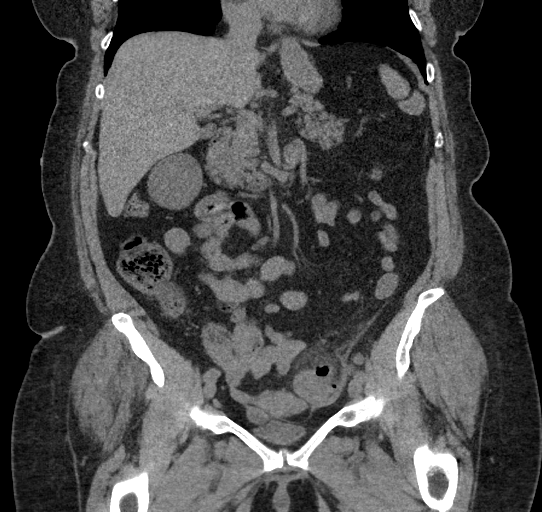
[im 83/150  soft-tissue]
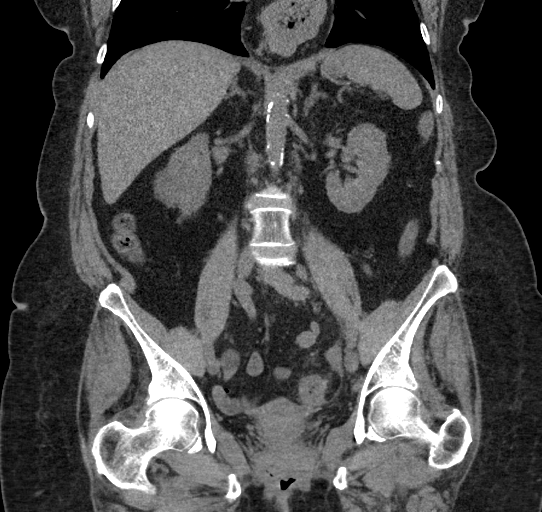

[16 of 46 positions shown; findings below may reference images not displayed]

FINDINGS: Lower chest: No acute airspace disease or pleural effusion.
Moderate-sized hiatal hernia with wall thickening of the herniated
stomach. Heart is normal in size.

Hepatobiliary: No focal hepatic lesion on this unenhanced exam.
Gallbladder physiologically distended, no calcified stone. No
biliary dilatation.

Pancreas: No ductal dilatation or inflammation.

Spleen: Normal in size without focal abnormality. Splenule
inferiorly.

Adrenals/Urinary Tract: Normal adrenal glands. No renal calculi or
hydronephrosis. No perinephric edema. 5.2 cm exophytic cyst arises
from the anterior lower right kidney. Urinary bladder is near
completely empty.

Stomach/Bowel: Acute diverticulitis of the proximal sigmoid colon.
Inflamed diverticulum, series 3, image 61 with fat stranding and
inflammation. No perforation or abscess. Trace adjacent non
organized free fluid. Multiple additional noninflamed colonic
diverticula. Moderate stool in the right colon. Remainder of the
colon is decompressed. Normal appendix. No small bowel obstruction
or inflammation. Moderate-sized hiatal hernia with mild wall
thickening of the herniated stomach.

Vascular/Lymphatic: Aortic atherosclerosis. No aortic aneurysm.
There multiple prominent distal retroperitoneal lymph nodes are
likely reactive, but nonspecific largest in the left lower
periaortic station measures 9 mm, series 3, image 41. This is
slightly more prominent than on prior exam.

Reproductive: Normal for age uterine atrophy.  No adnexal mass.

Other: Inflammatory change in the left lower quadrant related to
diverticulitis with trace non organized free fluid. No abscess or
perforation. No free air. Small to moderate-sized fat containing
umbilical hernia. Minimal fat in the right inguinal canal.

Musculoskeletal: Lumbar spondylosis. Degenerative disc disease and
facet hypertrophy with resultant grade 1 anterolisthesis at L5-S1.
IMPRESSION: 1. Acute uncomplicated diverticulitis of the proximal sigmoid colon.
No perforation or abscess.
2. Moderate-sized hiatal hernia with mild wall thickening of the
herniated stomach.
3. Fat containing umbilical hernia.

Aortic Atherosclerosis ([TF]-[TF]).

## 2021-04-25 NOTE — ED Provider Notes (Signed)
Emergency Medicine Provider Triage Evaluation Note  Khadija Thier , a 75 y.o. female  was evaluated in triage.  Pt complains of lower abd pain and back pain onset 3 days ago.  Pt has had assocaited painful bowel movements x2 today.  No dysuria.  Fevers to 100.4 at home.  Pain improved with oxycodone PTA.  Family hx of endometrial cancer, but no personal diagnosis of same.  Review of Systems  Positive: Fever, chills, abd pain Negative: Vomiting, dysuria  Physical Exam  BP (!) 148/103 (BP Location: Left Arm)   Pulse (!) 104   Temp 99.6 F (37.6 C) (Oral)   Resp 19   SpO2 97%   Gen:   Awake, no distress   Resp:  Normal effort  MSK:   Moves extremities without difficulty  Other:  Abd soft, minimally tender, no rebound or guarding  Medical Decision Making  Medically screening exam initiated at 10:25 PM.  Appropriate orders placed.  Myrtie Leuthold was informed that the remainder of the evaluation will be completed by another provider, this initial triage assessment does not replace that evaluation, and the importance of remaining in the ED until their evaluation is complete.  Abd pain.  No bloody diarrhea.  Labs and imaging pending.    Saleem Coccia, Gwenlyn Perking 04/25/21 2228    Elnora Morrison, MD 04/26/21 0004

## 2021-04-25 NOTE — ED Triage Notes (Signed)
Patient reports pain across lower abdomen /low back pain onset 3 days ago , denies emesis or diarrhea , low grade fever this evening , pain increases when moving her bowels .

## 2021-04-26 ENCOUNTER — Encounter: Payer: Medicare Other | Admitting: Physical Therapy

## 2021-04-26 DIAGNOSIS — K5732 Diverticulitis of large intestine without perforation or abscess without bleeding: Secondary | ICD-10-CM | POA: Diagnosis not present

## 2021-04-26 MED ORDER — HYDROMORPHONE HCL 1 MG/ML IJ SOLN
0.5000 mg | Freq: Once | INTRAMUSCULAR | Status: AC
Start: 2021-04-26 — End: 2021-04-26
  Administered 2021-04-26: 0.5 mg via INTRAVENOUS
  Filled 2021-04-26: qty 1

## 2021-04-26 MED ORDER — METRONIDAZOLE 500 MG/100ML IV SOLN
500.0000 mg | Freq: Once | INTRAVENOUS | Status: AC
  Administered 2021-04-26: 500 mg via INTRAVENOUS
  Filled 2021-04-26: qty 100

## 2021-04-26 MED ORDER — HYDROCODONE-ACETAMINOPHEN 5-325 MG PO TABS: 1.0000 | ORAL_TABLET | Freq: Four times a day (QID) | ORAL | 0 refills | Status: DC | PRN

## 2021-04-26 MED ORDER — CIPROFLOXACIN IN D5W 400 MG/200ML IV SOLN
400.0000 mg | Freq: Once | INTRAVENOUS | Status: AC
  Administered 2021-04-26: 400 mg via INTRAVENOUS
  Filled 2021-04-26: qty 200

## 2021-04-26 MED ORDER — ONDANSETRON HCL 4 MG/2ML IJ SOLN
4.0000 mg | Freq: Once | INTRAMUSCULAR | Status: AC
  Administered 2021-04-26: 4 mg via INTRAVENOUS
  Filled 2021-04-26: qty 2

## 2021-04-26 MED ORDER — CIPROFLOXACIN HCL 500 MG PO TABS: 500.0000 mg | ORAL_TABLET | Freq: Two times a day (BID) | ORAL | 0 refills | Status: DC

## 2021-04-26 MED ORDER — ONDANSETRON 4 MG PO TBDP: ORAL_TABLET | ORAL | 0 refills | Status: DC

## 2021-04-26 MED ORDER — METRONIDAZOLE 500 MG PO TABS
500.0000 mg | ORAL_TABLET | Freq: Four times a day (QID) | ORAL | 0 refills | Status: DC
Start: 2021-04-26 — End: 2021-08-02

## 2021-04-26 NOTE — Discharge Instructions (Addendum)
Follow-up with your family doctor Thursday or Friday for recheck.  If you get worse by having severe pain or continuing to throw up and come back to the emergency department

## 2021-04-26 NOTE — ED Provider Notes (Signed)
[facility] EMERGENCY DEPARTMENT Provider Note   CSN: [id number] Arrival date & time: 04/25/21  2215     History Chief Complaint  Patient presents with   Abdominal Pain   Back Pain    Katie Weber is a 75 y.o. female.  Patient complains of lower abdominal pain and nausea.  No fever no chills  The history is provided by the patient and medical records. No language interpreter was used.  Abdominal Pain Pain location:  Generalized Pain quality: aching   Pain radiates to:  Does not radiate Pain severity:  Moderate Onset quality:  Sudden Timing:  Constant Progression:  Worsening Chronicity:  New Context: not alcohol use   Relieved by:  Nothing Worsened by:  Nothing Associated symptoms: no chest pain, no cough, no diarrhea, no fatigue and no hematuria   Back Pain Associated symptoms: abdominal pain   Associated symptoms: no chest pain and no headaches       Past Medical History:  Diagnosis Date   Anemia    when pt was younger   Arthritis    arthritis fingers   Asthma    Coronary artery calcification 04/24/2017   Diverticulitis    x2 -last 1'17 tx. outpatient.   Environmental and seasonal allergies    2014 Possibly related to air freshners.   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   Unilateral primary osteoarthritis, left knee 01/04/2021   Coronary artery calcification 04/24/2017    Hyperlipidemia 04/24/2017   Acute bronchitis 09/29/2014   SBO (small bowel obstruction) ([place]) 09/27/2014   Nausea and vomiting 09/27/2014   Hypothyroidism 09/27/2014   Leukocytosis 09/27/2014    Past Surgical History:  Procedure Laterality Date   BALLOON DILATION N/A 05/04/2014   Procedure: BALLOON DILATION;  Surgeon: Garlan Fair, MD;  Location: [facility];  Service: Endoscopy;  Laterality: N/A;   BREAST LUMPECTOMY Right 1972   benign    BREAST SURGERY Right    '72 -benign tumor   childbirth- NVD x2      COLONOSCOPY WITH PROPOFOL N/A 05/04/2014   Procedure: COLONOSCOPY WITH PROPOFOL;  Surgeon: Garlan Fair, MD;  Location: [facility];  Service: Endoscopy;  Laterality: N/A;   DILATION AND CURETTAGE OF UTERUS     04-20-14 [facility] hospital endometrial polyp removed.   ESOPHAGOGASTRODUODENOSCOPY (EGD) WITH PROPOFOL N/A 05/04/2014   Procedure: ESOPHAGOGASTRODUODENOSCOPY (EGD) WITH PROPOFOL;  Surgeon: Garlan Fair, MD;  Location: [facility];  Service: Endoscopy;  Laterality: N/A;. Procedure done in [place], [place]- 5 yrs ago.   HYSTEROSCOPY WITH D & C N/A 04/20/2014   Procedure: DILATATION AND CURETTAGE /HYSTEROSCOPY;  Surgeon: [name], MD;  Location: [facility];  Service: Gynecology;  Laterality: N/A;   LITHOTRIPSY     POLYPECTOMY  2020   Dr. Lear Ng   TOTAL  KNEE ARTHROPLASTY Left 02/15/2021   Procedure: LEFT TOTAL KNEE ARTHROPLASTY;  Surgeon: Mcarthur Rossetti, MD;  Location: [place];  Service: Orthopedics;  Laterality: Left;   TUBAL LIGATION       OB History   No obstetric history on file.     Family History  Problem Relation Age of Onset   Cancer Other    Endometrial cancer Mother    CAD Father    Breast cancer Maternal Aunt     Social History   Tobacco Use   Smoking status: Never   Smokeless tobacco: Never  Vaping Use   Vaping Use: Never used  Substance Use Topics   Alcohol use: Yes    Comment: rare social   Drug use: No     Home Medications Prior to Admission medications   Medication Sig Start Date End Date Taking? Authorizing Provider  alendronate (FOSAMAX) 70 MG tablet Take 70 mg by mouth every Tuesday. Take with a full glass of water on an empty stomach.   Yes [provider]  cholecalciferol (VITAMIN D3) 25 MCG (1000 UNIT) tablet Take 1,000 Units by mouth 3 (three) times a week. No specific days   Yes [provider]  ciprofloxacin (CIPRO) 500 MG tablet Take 1 tablet (500 mg total) by mouth 2 (two) times daily. One po bid x 7 days 04/26/21  Yes Milton Ferguson, MD  clobetasol cream (TEMOVATE) 4.49 % Apply 1 application topically 2 (two) times daily as needed (vaginal itching).   Yes [provider]  HYDROcodone-acetaminophen (NORCO/VICODIN) 5-325 MG tablet Take 1 tablet by mouth every 6 (six) hours as needed for moderate pain. 04/26/21  Yes Milton Ferguson, MD  levothyroxine (SYNTHROID, LEVOTHROID) 100 MCG tablet Take 100 mcg by mouth daily before breakfast.   Yes [provider]  metroNIDAZOLE (FLAGYL) 500 MG tablet Take 1 tablet (500 mg total) by mouth 4 (four) times daily. One po bid x 7 days 04/26/21  Yes Milton Ferguson, MD  ondansetron (ZOFRAN ODT) 4 MG disintegrating tablet 4mg  ODT q4 hours prn nausea/vomit 04/26/21  Yes Milton Ferguson, MD  rosuvastatin (CRESTOR) 10 MG tablet Take 10 mg by mouth as directed. 10mg  two to three times weekly   Yes [provider]  vitamin B-12 (CYANOCOBALAMIN) 1000 MCG tablet Take 1,000 mcg by mouth 3 (three) times a week. No specific days   Yes [provider]  vitamin C (ASCORBIC ACID) 500 MG tablet Take 500 mg by mouth 3 (three) times a week. No specific days   Yes [provider]  zinc gluconate 50 MG tablet Take 50 mg by mouth once a week. No specific days   Yes [provider]  aspirin 81 MG chewable tablet Chew 1 tablet (81 mg total) by mouth 2 (two) times daily. Patient not taking: Reported on  04/26/2021 02/16/21   Mcarthur Rossetti, MD  methocarbamol (ROBAXIN) 500 MG tablet Take 1 tablet (500 mg total) by mouth every 6 (six) hours as needed for muscle spasms. Patient not taking: Reported on 04/26/2021 02/16/21   Mcarthur Rossetti, MD  oxyCODONE (OXY IR/ROXICODONE) 5 MG immediate release tablet Take 1-2 tablets (5-10 mg total) by mouth every 6 (six) hours as needed for moderate pain (pain score 4-6). Patient not taking: Reported on 04/26/2021 03/02/21   Mcarthur Rossetti, MD    Allergies    Contrast media [iodinated diagnostic agents] and Penicillins  Review of Systems   Review of Systems  Constitutional:  Negative for appetite change  and fatigue.  HENT:  Negative for congestion, ear discharge and sinus pressure.   Eyes:  Negative for discharge.  Respiratory:  Negative for cough.   Cardiovascular:  Negative for chest pain.  Gastrointestinal:  Positive for abdominal pain. Negative for diarrhea.  Genitourinary:  Negative for frequency and hematuria.  Musculoskeletal:  Positive for back pain.  Skin:  Negative for rash.  Neurological:  Negative for seizures and headaches.  Psychiatric/Behavioral:  Negative for hallucinations.    Physical Exam Updated Vital Signs BP (!) 97/57   Pulse (!) 58   Temp 98.1 F (36.7 C) (Oral)   Resp 12   SpO2 97%   Physical Exam Vitals and nursing note reviewed.  Constitutional:      Appearance: She is well-developed.  HENT:     Head: Normocephalic.     Mouth/Throat:     Mouth: Mucous membranes are moist.  Eyes:     General: No scleral icterus.    Conjunctiva/sclera: Conjunctivae normal.  Neck:     Thyroid: No thyromegaly.  Cardiovascular:     Rate and Rhythm: Normal rate and regular rhythm.     Heart sounds: No murmur heard.   No friction rub. No gallop.  Pulmonary:     Breath sounds: No stridor. No wheezing or rales.  Chest:     Chest wall: No tenderness.  Abdominal:     General: There is no distension.      Tenderness: There is abdominal tenderness. There is no rebound.  Musculoskeletal:        General: Normal range of motion.     Cervical back: Neck supple.  Lymphadenopathy:     Cervical: No cervical adenopathy.  Skin:    Findings: No erythema or rash.  Neurological:     Mental Status: She is alert and oriented to person, place, and time.     Motor: No abnormal muscle tone.     Coordination: Coordination normal.  Psychiatric:        Behavior: Behavior normal.    ED Results / Procedures / Treatments   Labs (all labs ordered are listed, but only abnormal results are displayed) Labs Reviewed  CBC WITH DIFFERENTIAL/PLATELET - Abnormal; Notable for the following components:      Result Value   WBC 13.7 (*)    Hemoglobin 11.9 (*)    Neutro Abs 10.7 (*)    Monocytes Absolute 1.1 (*)    All other components within normal limits  COMPREHENSIVE METABOLIC PANEL - Abnormal; Notable for the following components:   CO2 18 (*)    Glucose, Bld 118 (*)    All other components within normal limits  URINALYSIS, ROUTINE W REFLEX MICROSCOPIC - Abnormal; Notable for the following components:   APPearance HAZY (*)    Hgb urine dipstick SMALL (*)    Leukocytes,Ua SMALL (*)    Bacteria, UA FEW (*)    All other components within normal limits  LIPASE, BLOOD  LACTIC ACID, PLASMA  LACTIC ACID, PLASMA    EKG None  Radiology CT ABDOMEN PELVIS WO CONTRAST  Result Date: 04/25/2021 CLINICAL DATA:  Fever and abdominal pain. Lower abdominal and back pain for 3 days. EXAM: CT ABDOMEN AND PELVIS WITHOUT CONTRAST TECHNIQUE: Multidetector CT imaging of the abdomen and pelvis was performed following the standard protocol without IV contrast. COMPARISON:  CT 10/16/2015 FINDINGS: Lower chest: No acute airspace disease or pleural effusion. Moderate-sized hiatal hernia with wall thickening of the herniated stomach. Heart is normal in size. Hepatobiliary: No  focal hepatic lesion on this unenhanced exam. Gallbladder  physiologically distended, no calcified stone. No biliary dilatation. Pancreas: No ductal dilatation or inflammation. Spleen: Normal in size without focal abnormality. Splenule inferiorly. Adrenals/Urinary Tract: Normal adrenal glands. No renal calculi or hydronephrosis. No perinephric edema. 5.2 cm exophytic cyst arises from the anterior lower right kidney. Urinary bladder is near completely empty. Stomach/Bowel: Acute diverticulitis of the proximal sigmoid colon. Inflamed diverticulum, series 3, image 61 with fat stranding and inflammation. No perforation or abscess. Trace adjacent non organized free fluid. Multiple additional noninflamed colonic diverticula. Moderate stool in the right colon. Remainder of the colon is decompressed. Normal appendix. No small bowel obstruction or inflammation. Moderate-sized hiatal hernia with mild wall thickening of the herniated stomach. Vascular/Lymphatic: Aortic atherosclerosis. No aortic aneurysm. There multiple prominent distal retroperitoneal lymph nodes are likely reactive, but nonspecific largest in the left lower periaortic station measures 9 mm, series 3, image 41. This is slightly more prominent than on prior exam. Reproductive: Normal for age uterine atrophy.  No adnexal mass. Other: Inflammatory change in the left lower quadrant related to diverticulitis with trace non organized free fluid. No abscess or perforation. No free air. Small to moderate-sized fat containing umbilical hernia. Minimal fat in the right inguinal canal. Musculoskeletal: Lumbar spondylosis. Degenerative disc disease and facet hypertrophy with resultant grade 1 anterolisthesis at L5-S1. IMPRESSION: 1. Acute uncomplicated diverticulitis of the proximal sigmoid colon. No perforation or abscess. 2. Moderate-sized hiatal hernia with mild wall thickening of the herniated stomach. 3. Fat containing umbilical hernia. Aortic Atherosclerosis (ICD10-I70.0). Electronically Signed   By: Keith Rake M.D.    On: 04/25/2021 23:24    Procedures Procedures   Medications Ordered in ED Medications  ciprofloxacin (CIPRO) IVPB 400 mg (0 mg Intravenous Stopped 04/26/21 1311)  metroNIDAZOLE (FLAGYL) IVPB 500 mg (500 mg Intravenous New Bag/Given 04/26/21 1312)  HYDROmorphone (DILAUDID) injection 0.5 mg (0.5 mg Intravenous Given 04/26/21 1200)  ondansetron (ZOFRAN) injection 4 mg (4 mg Intravenous Given 04/26/21 1159)    ED Course  I have reviewed the triage vital signs and the nursing notes.  Pertinent labs & imaging results that were available during my care of the patient were reviewed by me and considered in my medical decision making (see chart for details).    MDM Rules/Calculators/A&P                          Patient with uncomplicated diverticulitis.  She is having minimal tenderness no fever no vomiting.  We will treat her with antibiotics as an outpatient with Cipro Flagyl Zofran and Vicodin and she will follow-up with her PCP Final Clinical Impression(s) / ED Diagnoses Final diagnoses:  Diverticulitis    Rx / DC Orders ED Discharge Orders          Ordered    ciprofloxacin (CIPRO) 500 MG tablet  2 times daily        04/26/21 1439    metroNIDAZOLE (FLAGYL) 500 MG tablet  4 times daily        04/26/21 1439    HYDROcodone-acetaminophen (NORCO/VICODIN) 5-325 MG tablet  Every 6 hours PRN        04/26/21 1439    ondansetron (ZOFRAN ODT) 4 MG disintegrating tablet        04/26/21 1439             Milton Ferguson, MD 04/26/21 1443

## 2021-05-10 ENCOUNTER — Encounter: Payer: Medicare Other | Admitting: Physical Therapy

## 2021-06-06 ENCOUNTER — Ambulatory Visit (INDEPENDENT_AMBULATORY_CARE_PROVIDER_SITE_OTHER): Payer: Medicare Other | Admitting: Orthopaedic Surgery

## 2021-06-06 ENCOUNTER — Ambulatory Visit: Payer: Self-pay

## 2021-06-06 ENCOUNTER — Ambulatory Visit (INDEPENDENT_AMBULATORY_CARE_PROVIDER_SITE_OTHER): Payer: Medicare Other

## 2021-06-06 ENCOUNTER — Other Ambulatory Visit: Payer: Self-pay

## 2021-06-06 ENCOUNTER — Encounter: Payer: Self-pay | Admitting: Orthopaedic Surgery

## 2021-06-06 DIAGNOSIS — M1711 Unilateral primary osteoarthritis, right knee: Secondary | ICD-10-CM | POA: Diagnosis not present

## 2021-06-06 DIAGNOSIS — Z96652 Presence of left artificial knee joint: Secondary | ICD-10-CM

## 2021-06-06 NOTE — Progress Notes (Signed)
The patient is well-known to me.  She is now over 3 months status post a left total knee arthroplasty to treat severe left knee osteoarthritis.  She has well-documented severe end-stage arthritis of her right knee.  At this point she does wish to proceed with a knee replacement for her right knee in October.  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  She says her left knee is done well.  2 views of the left knee today show well-seated total knee arthroplasty with no complicating features.  She has had no acute change in her medical status other than recently dealing with COVID.  She had also had an episode of diverticulitis.  Examination of her left operative knee shows excellent and full range of motion.  It is ligamentously stable and the swelling is minimal.  The incision is well-healed and her calf is soft.  Her right knee shows obvious varus malalignment.  There is patellofemoral crepitation and global tenderness throughout the arc of motion.  The right knee is loosely stable but very painful.  At this point given the failure of all conservative treatment for the right knee combined with her plain films of the right knee showing severe end-stage arthritis, I agree with proceeding with a right total knee arthroplasty for the treating this osteoarthritis in October of this year.  She will continue to rehabilitate her left knee with strengthening and we will see her at the time of surgery for right total knee arthroplasty.  Having had this done before she is fully aware of the intraoperative and postoperative course as well as the risk and benefits of surgery.

## 2021-06-11 ENCOUNTER — Emergency Department (HOSPITAL_COMMUNITY): Payer: Medicare Other

## 2021-06-11 ENCOUNTER — Emergency Department (HOSPITAL_COMMUNITY)
Admission: EM | Admit: 2021-06-11 | Discharge: 2021-06-12 | Disposition: A | Discharge status: Home / Self Care | Payer: Medicare Other | Attending: Emergency Medicine | Admitting: Emergency Medicine

## 2021-06-11 ENCOUNTER — Other Ambulatory Visit: Payer: Self-pay

## 2021-06-11 ENCOUNTER — Encounter (HOSPITAL_COMMUNITY): Payer: Self-pay | Admitting: Emergency Medicine

## 2021-06-11 DIAGNOSIS — Z7982 Long term (current) use of aspirin: Secondary | ICD-10-CM | POA: Insufficient documentation

## 2021-06-11 DIAGNOSIS — M549 Dorsalgia, unspecified: Secondary | ICD-10-CM | POA: Insufficient documentation

## 2021-06-11 DIAGNOSIS — Z79899 Other long term (current) drug therapy: Secondary | ICD-10-CM | POA: Insufficient documentation

## 2021-06-11 DIAGNOSIS — R110 Nausea: Secondary | ICD-10-CM | POA: Insufficient documentation

## 2021-06-11 DIAGNOSIS — Z96652 Presence of left artificial knee joint: Secondary | ICD-10-CM | POA: Diagnosis not present

## 2021-06-11 DIAGNOSIS — R1013 Epigastric pain: Secondary | ICD-10-CM | POA: Insufficient documentation

## 2021-06-11 DIAGNOSIS — I2510 Atherosclerotic heart disease of native coronary artery without angina pectoris: Secondary | ICD-10-CM | POA: Diagnosis not present

## 2021-06-11 DIAGNOSIS — E039 Hypothyroidism, unspecified: Secondary | ICD-10-CM | POA: Insufficient documentation

## 2021-06-11 DIAGNOSIS — R079 Chest pain, unspecified: Secondary | ICD-10-CM

## 2021-06-11 IMAGING — CR DG CHEST 2V
2 series · 2 of 2 positions shown · non-contrast
Comparison: CT [DATE] and radiograph [DATE].

CLINICAL DATA: Chest pain

EXAM:
CHEST - 2 VIEW

[chest pa]
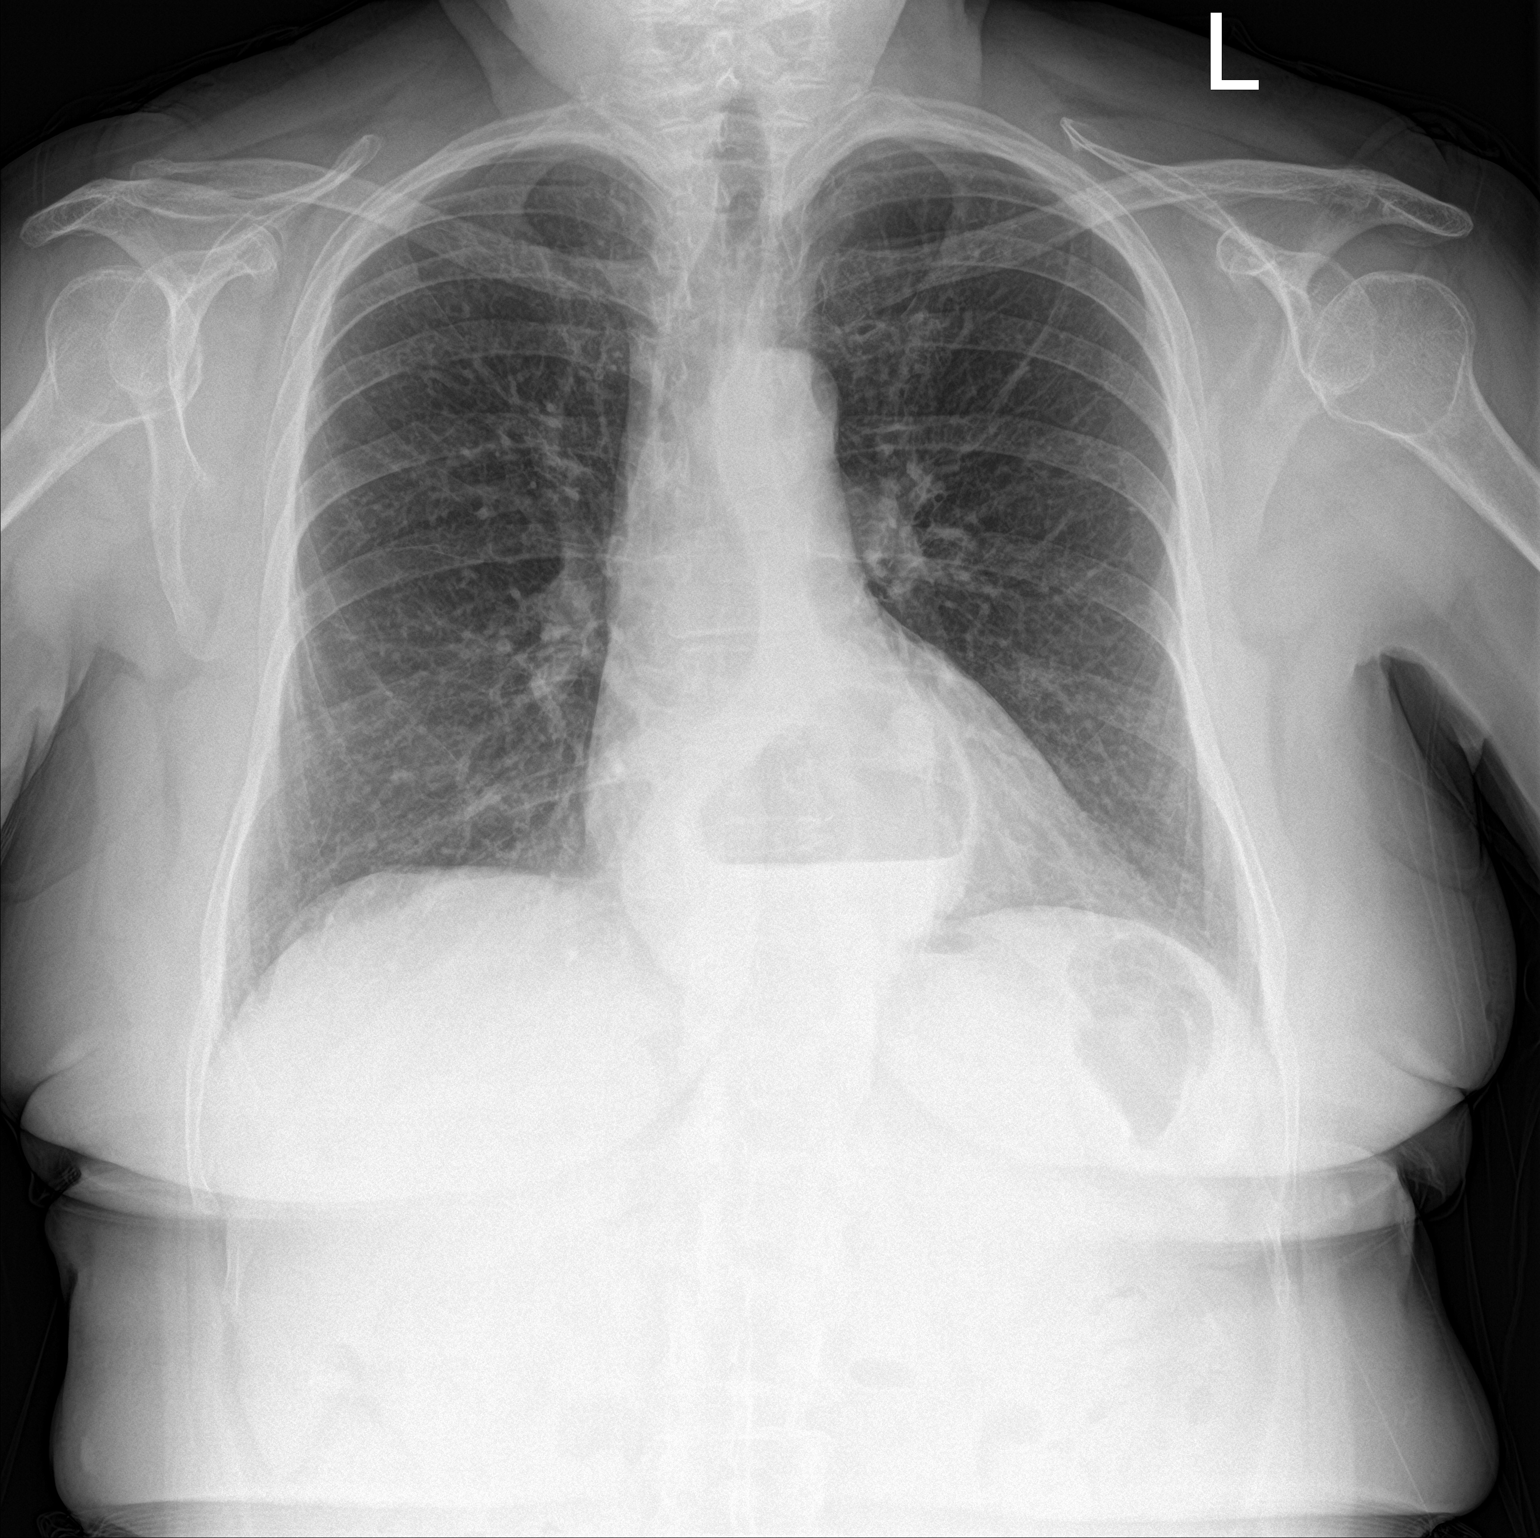

[chest lat]
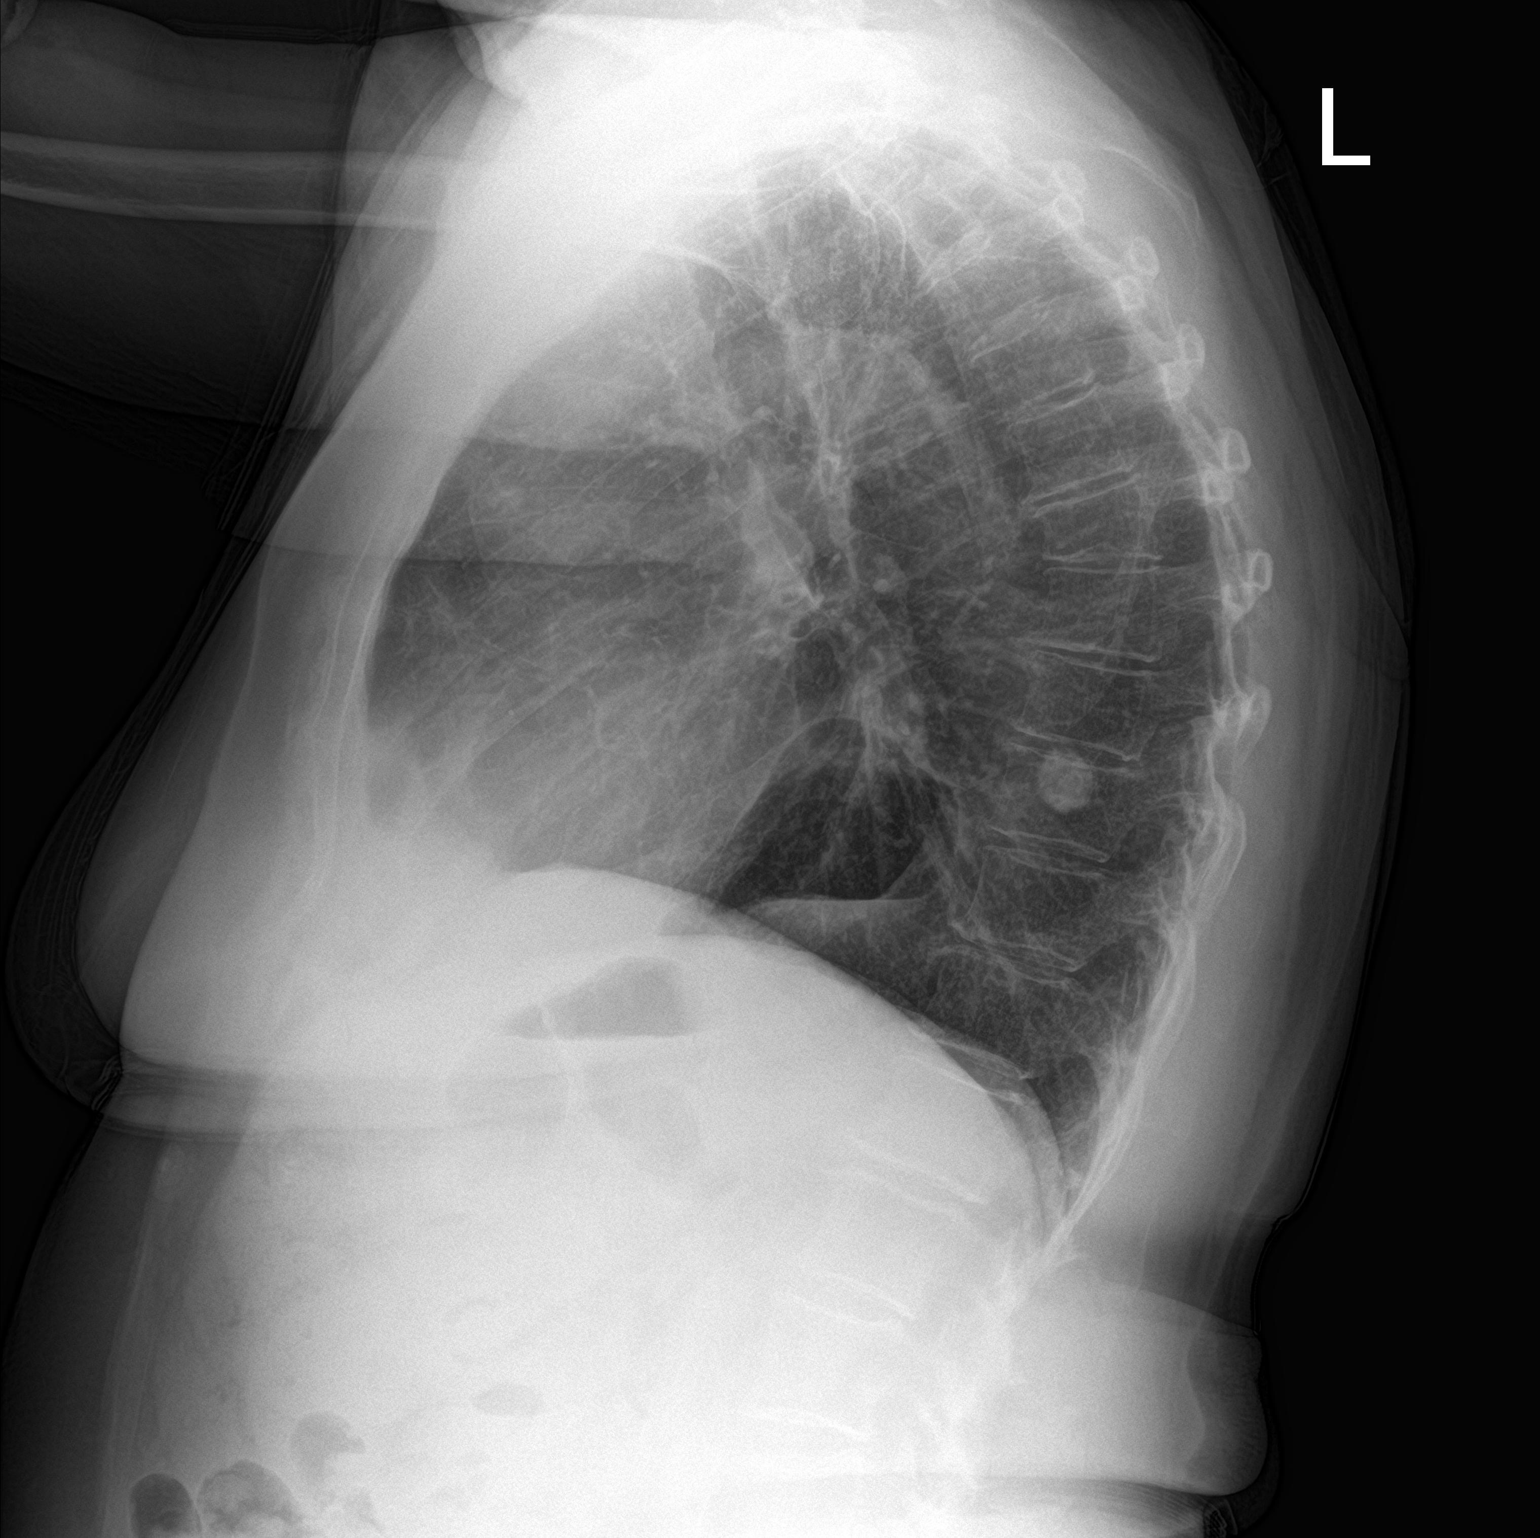

[2 of 2 positions shown; findings below may reference images not displayed]

FINDINGS: The heart size and mediastinal contours are within normal limits.
Moderate size hiatal hernia. Chronic senescent lung changes. No
focal consolidation. No pleural effusion. No pneumothorax. 1.6 cm
left lower lobe calcified granuloma. The visualized skeletal
structures are unremarkable.
IMPRESSION: No active cardiopulmonary disease.

## 2021-06-11 MED ORDER — OXYCODONE-ACETAMINOPHEN 5-325 MG PO TABS
1.0000 | ORAL_TABLET | Freq: Once | ORAL | Status: AC
  Administered 2021-06-11: 1 via ORAL
  Filled 2021-06-11: qty 1

## 2021-06-11 NOTE — ED Provider Notes (Signed)
MSE was initiated and I personally evaluated the patient and placed orders (if any) at  11:20 PM on June 11, 2021.  Patient to ED with sudden onset lower central, sharp chest pain, radiates through to back, that started tonight after eating dinner. She reports no history of similar symptoms. "It's like if I could belch it would feel better". No SOB. No h/o CAD.   Today's Vitals   06/11/21 2312 06/11/21 2313 06/11/21 2319  BP:   (!) 148/83  Pulse:   76  Resp:   16  Temp:   98.4 F (36.9 C)  TempSrc:   Oral  SpO2:   97%  Weight:  79.4 kg   Height:  '5\' 3"'$  (1.6 m)   PainSc: 7      Body mass index is 31 kg/m.  RRR Lungs clear No epigastric tenderness.  The patient appears stable so that the remainder of the MSE may be completed by another provider.   Charlann Lange, PA-C 06/11/21 2323    Maudie Flakes, MD 06/12/21 [phone]

## 2021-06-11 NOTE — ED Triage Notes (Signed)
Pt reports substernal chest pain that radiates around to her back which started after dinner around 8:30pm  Pt states she took Tums and got no relief.  COVID in August.  Pt states "if I could burp I would feel OK."

## 2021-06-12 DIAGNOSIS — R079 Chest pain, unspecified: Secondary | ICD-10-CM | POA: Diagnosis not present

## 2021-06-12 LAB — CBC WITH DIFFERENTIAL/PLATELET
Abs Immature Granulocytes: 0.04 10*3/uL (ref 0.00–0.07)
Basophils Absolute: 0.1 10*3/uL (ref 0.0–0.1)
Basophils Relative: 1 %
Eosinophils Absolute: 0.2 10*3/uL (ref 0.0–0.5)
Eosinophils Relative: 1 %
HCT: 41.4 % (ref 36.0–46.0)
Hemoglobin: 12.7 g/dL (ref 12.0–15.0)
Immature Granulocytes: 0 %
Lymphocytes Relative: 17 %
Lymphs Abs: 2.2 10*3/uL (ref 0.7–4.0)
MCH: 25.9 pg — ABNORMAL LOW (ref 26.0–34.0)
MCHC: 30.7 g/dL (ref 30.0–36.0)
MCV: 84.5 fL (ref 80.0–100.0)
Monocytes Absolute: 1 10*3/uL (ref 0.1–1.0)
Monocytes Relative: 8 %
Neutro Abs: 9.6 10*3/uL — ABNORMAL HIGH (ref 1.7–7.7)
Neutrophils Relative %: 73 %
Platelets: 341 10*3/uL (ref 150–400)
RBC: 4.9 MIL/uL (ref 3.87–5.11)
RDW: 17.7 % — ABNORMAL HIGH (ref 11.5–15.5)
WBC: 13 10*3/uL — ABNORMAL HIGH (ref 4.0–10.5)
nRBC: 0 % (ref 0.0–0.2)

## 2021-06-12 LAB — COMPREHENSIVE METABOLIC PANEL
ALT: 25 U/L (ref 0–44)
AST: 45 U/L — ABNORMAL HIGH (ref 15–41)
Albumin: 4 g/dL (ref 3.5–5.0)
Alkaline Phosphatase: 62 U/L (ref 38–126)
Anion gap: 9 (ref 5–15)
BUN: 14 mg/dL (ref 8–23)
CO2: 25 mmol/L (ref 22–32)
Calcium: 9.7 mg/dL (ref 8.9–10.3)
Chloride: 103 mmol/L (ref 98–111)
Creatinine, Ser: 1.36 mg/dL — ABNORMAL HIGH (ref 0.44–1.00)
GFR, Estimated: 41 mL/min — ABNORMAL LOW (ref >60)
Glucose, Bld: 119 mg/dL — ABNORMAL HIGH (ref 70–99)
Potassium: 4.4 mmol/L (ref 3.5–5.1)
Sodium: 137 mmol/L (ref 135–145)
Total Bilirubin: 0.9 mg/dL (ref 0.3–1.2)
Total Protein: 7.7 g/dL (ref 6.5–8.1)

## 2021-06-12 LAB — TROPONIN I (HIGH SENSITIVITY)
Troponin I (High Sensitivity): 4 ng/L (ref <18)
Troponin I (High Sensitivity): 4 ng/L (ref <18)

## 2021-06-12 NOTE — ED Provider Notes (Signed)
[facility] EMERGENCY DEPARTMENT Provider Note   CSN: [id number] Arrival date & time: 06/11/21  2305     History Chief Complaint  Patient presents with   Chest Pain    Katie Weber is a 75 y.o. female.  Patient to ED for evaluation of sharp pain in the epigastric/low center chest, radiates to the back, started tonight after eating dinner. It is constant, associated with nausea without vomiting. No history of similar symptoms in the past. No SOB, cough, recent fever. No history of heart disease. She tried taking a TUMs without relief. "Feels like if I belched it would feel better".   The history is provided by the patient and the spouse. No language interpreter was used.  Chest Pain Associated symptoms: back pain and nausea   Associated symptoms: no cough, no dizziness, no fever, no shortness of breath, no vomiting and no weakness       Past Medical History:  Diagnosis Date   Anemia    when pt was younger   Arthritis    arthritis fingers   Asthma    Coronary artery calcification 04/24/2017   Diverticulitis    x2 -last 1'17 tx. outpatient.   Environmental and seasonal allergies    2014 Possibly related to air freshners.   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/15/2021   Unilateral primary osteoarthritis, left knee 01/04/2021   Coronary  artery calcification 04/24/2017   Hyperlipidemia 04/24/2017   Acute bronchitis 09/29/2014   SBO (small bowel obstruction) ([place]) 09/27/2014   Nausea and vomiting 09/27/2014   Hypothyroidism 09/27/2014   Leukocytosis 09/27/2014    Past Surgical History:  Procedure Laterality Date   BALLOON DILATION N/A 05/04/2014   Procedure: BALLOON DILATION;  Surgeon: Garlan Fair, MD;  Location: [facility];  Service: Endoscopy;  Laterality: N/A;   BREAST LUMPECTOMY Right 1972   benign    BREAST SURGERY Right    '72 -benign tumor   childbirth- NVD x2      COLONOSCOPY WITH PROPOFOL N/A 05/04/2014   Procedure: COLONOSCOPY WITH PROPOFOL;  Surgeon: Garlan Fair, MD;  Location: [facility];  Service: Endoscopy;  Laterality: N/A;   DILATION AND CURETTAGE OF UTERUS     04-20-14 [facility] hospital endometrial polyp removed.   ESOPHAGOGASTRODUODENOSCOPY (EGD) WITH PROPOFOL N/A 05/04/2014   Procedure: ESOPHAGOGASTRODUODENOSCOPY (EGD) WITH PROPOFOL;  Surgeon: Garlan Fair, MD;  Location: [facility];  Service: Endoscopy;  Laterality: N/A;. Procedure done in [place], [place]- 5 yrs ago.   HYSTEROSCOPY WITH D & C N/A 04/20/2014   Procedure: DILATATION AND CURETTAGE /HYSTEROSCOPY;  Surgeon: [name], MD;  Location: [facility];  Service: Gynecology;  Laterality: N/A;   LITHOTRIPSY     POLYPECTOMY  2020   Dr. Lear Ng  TOTAL KNEE ARTHROPLASTY Left 02/15/2021   Procedure: LEFT TOTAL KNEE ARTHROPLASTY;  Surgeon: Mcarthur Rossetti, MD;  Location: [facility];  Service: Orthopedics;  Laterality: Left;   TUBAL LIGATION       OB History   No obstetric history on file.     Family History  Problem Relation Age of Onset   Cancer Other    Endometrial cancer Mother    CAD Father    Breast cancer Maternal Aunt     Social History   Tobacco Use   Smoking status: Never   Smokeless tobacco: Never  Vaping Use   Vaping Use: Never used  Substance Use Topics   Alcohol use: Yes    Comment: rare  social   Drug use: No    Home Medications Prior to Admission medications   Medication Sig Start Date End Date Taking? Authorizing Provider  alendronate (FOSAMAX) 70 MG tablet Take 70 mg by mouth every Tuesday. Take with a full glass of water on an empty stomach.    [provider]  aspirin 81 MG chewable tablet Chew 1 tablet (81 mg total) by mouth 2 (two) times daily. Patient not taking: Reported on 04/26/2021 02/16/21   Mcarthur Rossetti, MD  cholecalciferol (VITAMIN D3) 25 MCG (1000 UNIT) tablet Take 1,000 Units by mouth 3 (three) times a week. No specific days    [provider]  ciprofloxacin (CIPRO) 500 MG tablet Take 1 tablet (500 mg total) by mouth 2 (two) times daily. One po bid x 7 days 04/26/21   Milton Ferguson, MD  clobetasol cream (TEMOVATE) [id number] % Apply 1 application topically 2 (two) times daily as needed (vaginal itching).    [provider]  HYDROcodone-acetaminophen (NORCO/VICODIN) 5-325 MG tablet Take 1 tablet by mouth every 6 (six) hours as needed for moderate pain. 04/26/21   Milton Ferguson, MD  levothyroxine (SYNTHROID, LEVOTHROID) 100 MCG tablet Take 100 mcg by mouth daily before breakfast.    [provider]  methocarbamol (ROBAXIN) 500 MG tablet Take 1 tablet (500 mg total) by mouth every 6 (six) hours as needed for muscle spasms. Patient not taking: Reported on 04/26/2021 02/16/21   Mcarthur Rossetti, MD  metroNIDAZOLE (FLAGYL) 500 MG tablet Take 1 tablet (500 mg total) by mouth 4 (four) times daily. One po bid x 7 days 04/26/21   Milton Ferguson, MD  ondansetron (ZOFRAN ODT) 4 MG disintegrating tablet '4mg'$  ODT q4 hours prn nausea/vomit 04/26/21   Milton Ferguson, MD  oxyCODONE (OXY IR/ROXICODONE) 5 MG immediate release tablet Take 1-2 tablets (5-10 mg total) by mouth every 6 (six) hours as needed for moderate pain (pain score 4-6). Patient not taking: Reported on 04/26/2021 03/02/21   Mcarthur Rossetti, MD  rosuvastatin (CRESTOR)  10 MG tablet Take 10 mg by mouth as directed. '10mg'$  two to three times weekly    [provider]  vitamin B-12 (CYANOCOBALAMIN) 1000 MCG tablet Take 1,000 mcg by mouth 3 (three) times a week. No specific days    [provider]  vitamin C (ASCORBIC ACID) 500 MG tablet Take 500 mg by mouth 3 (three) times a week. No specific days    [provider]  zinc gluconate 50 MG tablet Take 50 mg by mouth once a week. No specific days    [provider]    Allergies    Contrast media [iodinated diagnostic agents] and Penicillins  Review of Systems   Review of Systems  Constitutional:  Negative for chills  and fever.  HENT: Negative.    Respiratory: Negative.  Negative for cough and shortness of breath.   Cardiovascular:  Positive for chest pain.  Gastrointestinal:  Positive for nausea. Negative for diarrhea and vomiting.  Musculoskeletal:  Positive for back pain.  Skin: Negative.   Neurological: Negative.  Negative for dizziness, weakness and light-headedness.   Physical Exam Updated Vital Signs BP 139/77 (BP Location: Right Arm)   Pulse 66   Temp 98.4 F (36.9 C) (Oral)   Resp 18   Ht '5\' 3"'$  (1.6 m)   Wt 79.4 kg   SpO2 98%   BMI 31.00 kg/m   Physical Exam Vitals and nursing note reviewed.  Constitutional:      Appearance: She is well-developed.  HENT:     Head: Normocephalic.  Cardiovascular:     Rate and Rhythm: Normal rate and regular rhythm.     Heart sounds: No murmur heard. Pulmonary:     Effort: Pulmonary effort is normal.     Breath sounds: Normal breath sounds. No wheezing, rhonchi or rales.  Chest:     Chest wall: No tenderness.  Abdominal:     General: Bowel sounds are normal.     Palpations: Abdomen is soft.     Tenderness: There is abdominal tenderness (Epigastric). There is no guarding or rebound.  Musculoskeletal:        General: Normal range of motion.     Cervical back: Normal range of motion and neck supple.  Skin:     General: Skin is warm and dry.  Neurological:     General: No focal deficit present.     Mental Status: She is alert and oriented to person, place, and time.    ED Results / Procedures / Treatments   Labs (all labs ordered are listed, but only abnormal results are displayed) Labs Reviewed  CBC WITH DIFFERENTIAL/PLATELET - Abnormal; Notable for the following components:      Result Value   WBC 13.0 (*)    MCH 25.9 (*)    RDW 17.7 (*)    Neutro Abs 9.6 (*)    All other components within normal limits  COMPREHENSIVE METABOLIC PANEL - Abnormal; Notable for the following components:   Glucose, Bld 119 (*)    Creatinine, Ser 1.36 (*)    AST 45 (*)    GFR, Estimated 41 (*)    All other components within normal limits  TROPONIN I (HIGH SENSITIVITY)  TROPONIN I (HIGH SENSITIVITY)   Results for orders placed or performed during the hospital encounter of 06/11/21  CBC with Differential  Result Value Ref Range   WBC 13.0 (H) 4.0 - 10.5 K/uL   RBC 4.90 3.87 - 5.11 MIL/uL   Hemoglobin 12.7 12.0 - 15.0 g/dL   HCT 41.4 36.0 - 46.0 %   MCV 84.5 80.0 - 100.0 fL   MCH 25.9 (L) 26.0 - 34.0 pg   MCHC 30.7 30.0 - 36.0 g/dL   RDW 17.7 (H) 11.5 - 15.5 %   Platelets 341 150 - 400 K/uL   nRBC 0.0 0.0 - 0.2 %   Neutrophils Relative % 73 %   Neutro Abs 9.6 (H) 1.7 - 7.7 K/uL   Lymphocytes Relative 17 %   Lymphs Abs 2.2 0.7 - 4.0 K/uL   Monocytes Relative 8 %   Monocytes Absolute 1.0 0.1 - 1.0 K/uL   Eosinophils Relative 1 %   Eosinophils Absolute 0.2 0.0 - 0.5 K/uL   Basophils Relative 1 %  Basophils Absolute 0.1 0.0 - 0.1 K/uL   Immature Granulocytes 0 %   Abs Immature Granulocytes 0.04 0.00 - 0.07 K/uL  Comprehensive metabolic panel  Result Value Ref Range   Sodium 137 135 - 145 mmol/L   Potassium 4.4 3.5 - 5.1 mmol/L   Chloride 103 98 - 111 mmol/L   CO2 25 22 - 32 mmol/L   Glucose, Bld 119 (H) 70 - 99 mg/dL   BUN 14 8 - 23 mg/dL   Creatinine, Ser 1.36 (H) 0.44 - 1.00 mg/dL    Calcium 9.7 8.9 - 10.3 mg/dL   Total Protein 7.7 6.5 - 8.1 g/dL   Albumin 4.0 3.5 - 5.0 g/dL   AST 45 (H) 15 - 41 U/L   ALT 25 0 - 44 U/L   Alkaline Phosphatase 62 38 - 126 U/L   Total Bilirubin 0.9 0.3 - 1.2 mg/dL   GFR, Estimated 41 (L) >60 mL/min   Anion gap 9 5 - 15  Troponin I (High Sensitivity)  Result Value Ref Range   Troponin I (High Sensitivity) 4 <18 ng/L  Troponin I (High Sensitivity)  Result Value Ref Range   Troponin I (High Sensitivity) 4 <18 ng/L    EKG EKG Interpretation  Date/Time:  Sunday June 12 2021 02:46:17 EDT Ventricular Rate:  67 PR Interval:  134 QRS Duration: 82 QT Interval:  402 QTC Calculation: 424 R Axis:   37 Text Interpretation: Normal sinus rhythm Normal ECG Confirmed by Ripley Fraise [phone]) on 06/12/2021 4:15:46 AM  Radiology DG Chest 2 View  Result Date: 06/11/2021 CLINICAL DATA:  Chest pain EXAM: CHEST - 2 VIEW COMPARISON:  CT April 02, 2017 and radiograph September 29, 2014. FINDINGS: The heart size and mediastinal contours are within normal limits. Moderate size hiatal hernia. Chronic senescent lung changes. No focal consolidation. No pleural effusion. No pneumothorax. 1.6 cm left lower lobe calcified granuloma. The visualized skeletal structures are unremarkable. IMPRESSION: No active cardiopulmonary disease. Electronically Signed   By: Dahlia Bailiff M.D.   On: 06/11/2021 23:47    Procedures Procedures   Medications Ordered in ED Medications  oxyCODONE-acetaminophen (PERCOCET/ROXICET) 5-325 MG per tablet 1 tablet (1 tablet Oral Given 06/11/21 2330)    ED Course  I have reviewed the triage vital signs and the nursing notes.  Pertinent labs & imaging results that were available during my care of the patient were reviewed by me and considered in my medical decision making (see chart for details).    MDM Rules/Calculators/A&P                           Patient to ED with sharp, constant epigastric vs low center chest pain as  further described in the HPI.   Well appearing, VSS. EKG has no acute changes. CXR clear. Labs pending.   Troponin x 2 negative. Mild WBCs of 13, Cr 1.36, normal BUN. Labs otherwise unremarkable.   On re-examination, the patient reports her pain resolved completely during her wait to be seen. No further symptoms. She is ambulated without recurrent pain.   She has been seen by Dr. Christy Gentles and felt appropriate for discharge home.   Final Clinical Impression(s) / ED Diagnoses Final diagnoses:  None   Nonspecific check pain  Rx / DC Orders ED Discharge Orders     None        Charlann Lange, PA-C 06/12/21 0449    Ripley Fraise, MD 06/12/21 0730

## 2021-06-12 NOTE — Discharge Instructions (Addendum)
Continue your regular medications as prescribed. Follow up with your doctor for recheck next week.   Return to the ED With any new or concerning symptoms at any time.

## 2021-07-27 ENCOUNTER — Other Ambulatory Visit: Payer: Self-pay | Admitting: Physician Assistant

## 2021-07-27 DIAGNOSIS — M1712 Unilateral primary osteoarthritis, left knee: Secondary | ICD-10-CM

## 2021-08-03 ENCOUNTER — Other Ambulatory Visit: Payer: Self-pay

## 2021-08-04 NOTE — Pre-Procedure Instructions (Signed)
Surgical Instructions    Your procedure is scheduled on Tuesday, November 1st.  Report to [facility] Main Entrance "A" at 12:30 P.M., then check in with the Admitting office.  Call this number if you have problems the morning of surgery:  [phone]   If you have any questions prior to your surgery date call [phone]: Open Monday-Friday 8am-4pm    Remember:  Do not eat after midnight the night before your surgery  You may drink clear liquids until 11:30 a.m. the morning of your surgery.   Clear liquids allowed are: Water, Non-Citrus Juices (without pulp), Carbonated Beverages, Clear Tea, Black Coffee Only, and Gatorade   Enhanced Recovery after Surgery for Orthopedics Enhanced Recovery after Surgery is a protocol used to improve the stress on your body and your recovery after surgery.  Patient Instructions  The day of surgery (if you do NOT have diabetes):  Drink ONE (1) Pre-Surgery Clear Ensure by 11:30 am the morning of surgery   This drink was given to you during your hospital  pre-op appointment visit. Nothing else to drink after completing the  Pre-Surgery Clear Ensure.         If you have questions, please contact your surgeon's office.     Take these medicines the morning of surgery with A SIP OF WATER  levothyroxine (SYNTHROID, LEVOTHROID) rosuvastatin (CRESTOR)   As of today, STOP taking any Aspirin (unless otherwise instructed by your surgeon) Aleve, Naproxen, Ibuprofen, Motrin, Advil, Goody's, BC's, all herbal medications, fish oil, and all vitamins.                     Do NOT Smoke (Tobacco/Vaping) or drink Alcohol 24 hours prior to your procedure.  If you use a CPAP at night, you may bring all equipment for your overnight stay.   Contacts, glasses, piercing's, hearing aid's, dentures or partials may not be worn into surgery, please bring cases for these belongings.    For patients admitted to the hospital, discharge time will be determined by your  treatment team.   Patients discharged the day of surgery will not be allowed to drive home, and someone needs to stay with them for 24 hours.  NO VISITORS WILL BE ALLOWED IN PRE-OP WHERE PATIENTS GET READY FOR SURGERY.  ONLY 1 SUPPORT PERSON MAY BE PRESENT IN THE WAITING ROOM WHILE YOU ARE IN SURGERY.  IF YOU ARE TO BE ADMITTED, ONCE YOU ARE IN YOUR ROOM YOU WILL BE ALLOWED TWO (2) VISITORS.  Minor children may have two parents present. Special consideration for safety and communication needs will be reviewed on a case by case basis.   Special instructions:   [facility]- Preparing For Surgery  Before surgery, you can play an important role. Because skin is not sterile, your skin needs to be as free of germs as possible. You can reduce the number of germs on your skin by washing with CHG (chlorahexidine gluconate) Soap before surgery.  CHG is an antiseptic cleaner which kills germs and bonds with the skin to continue killing germs even after washing.    Oral Hygiene is also important to reduce your risk of infection.  Remember - BRUSH YOUR TEETH THE MORNING OF SURGERY WITH YOUR REGULAR TOOTHPASTE  Please do not use if you have an allergy to CHG or antibacterial soaps. If your skin becomes reddened/irritated stop using the CHG.  Do not shave (including legs and underarms) for at least 48 hours prior to first CHG shower. It  is OK to shave your face.  Please follow these instructions carefully.   Shower the NIGHT BEFORE SURGERY and the MORNING OF SURGERY  If you chose to wash your hair, wash your hair first as usual with your normal shampoo.  After you shampoo, rinse your hair and body thoroughly to remove the shampoo.  Use CHG Soap as you would any other liquid soap. You can apply CHG directly to the skin and wash gently with a scrungie or a clean washcloth.   Apply the CHG Soap to your body ONLY FROM THE NECK DOWN.  Do not use on open wounds or open sores. Avoid contact with your eyes,  ears, mouth and genitals (private parts). Wash Face and genitals (private parts)  with your normal soap.   Wash thoroughly, paying special attention to the area where your surgery will be performed.  Thoroughly rinse your body with warm water from the neck down.  DO NOT shower/wash with your normal soap after using and rinsing off the CHG Soap.  Pat yourself dry with a CLEAN TOWEL.  Wear CLEAN PAJAMAS to bed the night before surgery  Place CLEAN SHEETS on your bed the night before your surgery  DO NOT SLEEP WITH PETS.   Day of Surgery: Shower with CHG soap. Do not wear jewelry, make up, nail polish, gel polish, artificial nails, or any other type of covering on natural nails including finger and toenails. If patients have artificial nails, gel coating, etc. that need to be removed by a nail salon please have this removed prior to surgery. Surgery may need to be canceled/delayed if the surgeon/ anesthesia feels like the patient is unable to be adequately monitored. Do not wear lotions, powders, perfumes, or deodorant. Do not shave 48 hours prior to surgery.   Do not bring valuables to the hospital. [facility] is not responsible for any belongings or valuables. Wear Clean/Comfortable clothing the morning of surgery Remember to brush your teeth WITH YOUR REGULAR TOOTHPASTE.   Please read over the following fact sheets that you were given.   3 days prior to your procedure or After your COVID test   You are not required to quarantine however you are required to wear a well-fitting mask when you are out and around people not in your household. If your mask becomes wet or soiled, replace with a new one.   Wash your hands often with soap and water for 20 seconds or clean your hands with an alcohol-based hand sanitizer that contains at least 60% alcohol.   Do not share personal items.   Notify your provider:  o if you are in close contact with someone who has COVID  o or if you  develop a fever of 100.4 or greater, sneezing, cough, sore throat, shortness of breath or body aches.

## 2021-08-05 ENCOUNTER — Other Ambulatory Visit: Payer: Self-pay

## 2021-08-05 ENCOUNTER — Encounter (HOSPITAL_COMMUNITY)
Admission: RE | Admit: 2021-08-05 | Discharge: 2021-08-05 | Disposition: A | Discharge status: Home / Self Care | Payer: Medicare Other | Source: Ambulatory Visit | Attending: Orthopaedic Surgery | Admitting: Orthopaedic Surgery

## 2021-08-05 ENCOUNTER — Encounter (HOSPITAL_COMMUNITY): Payer: Self-pay

## 2021-08-05 VITALS — BP 146/78 | HR 75 | Temp 97.8°F | Resp 18 | Ht 63.5 in | Wt 183.8 lb

## 2021-08-05 DIAGNOSIS — M1712 Unilateral primary osteoarthritis, left knee: Secondary | ICD-10-CM | POA: Insufficient documentation

## 2021-08-05 DIAGNOSIS — E7800 Pure hypercholesterolemia, unspecified: Secondary | ICD-10-CM | POA: Diagnosis not present

## 2021-08-05 DIAGNOSIS — Z01812 Encounter for preprocedural laboratory examination: Secondary | ICD-10-CM | POA: Insufficient documentation

## 2021-08-05 DIAGNOSIS — Z01818 Encounter for other preprocedural examination: Secondary | ICD-10-CM

## 2021-08-05 LAB — BASIC METABOLIC PANEL
Anion gap: 5 (ref 5–15)
BUN: 12 mg/dL (ref 8–23)
CO2: 25 mmol/L (ref 22–32)
Calcium: 9 mg/dL (ref 8.9–10.3)
Chloride: 104 mmol/L (ref 98–111)
Creatinine, Ser: 0.72 mg/dL (ref 0.44–1.00)
GFR, Estimated: >60 mL/min (ref >60)
Glucose, Bld: 99 mg/dL (ref 70–99)
Potassium: 4.1 mmol/L (ref 3.5–5.1)
Sodium: 134 mmol/L — ABNORMAL LOW (ref 135–145)

## 2021-08-05 LAB — TYPE AND SCREEN
ABO/RH(D): A POS
Antibody Screen: NEGATIVE

## 2021-08-05 LAB — CBC
HCT: 40.4 % (ref 36.0–46.0)
Hemoglobin: 12.3 g/dL (ref 12.0–15.0)
MCH: 27 pg (ref 26.0–34.0)
MCHC: 30.4 g/dL (ref 30.0–36.0)
MCV: 88.6 fL (ref 80.0–100.0)
Platelets: 309 10*3/uL (ref 150–400)
RBC: 4.56 MIL/uL (ref 3.87–5.11)
RDW: 16.9 % — ABNORMAL HIGH (ref 11.5–15.5)
WBC: 7.7 10*3/uL (ref 4.0–10.5)
nRBC: 0 % (ref 0.0–0.2)

## 2021-08-05 LAB — SURGICAL PCR SCREEN
MRSA, PCR: NEGATIVE
Staphylococcus aureus: NEGATIVE

## 2021-08-05 NOTE — Progress Notes (Addendum)
PCP - Dr. Leeroy Cha Cardiologist - denies  Chest x-ray -06/11/21  EKG - 06/14/21 Stress Test - 07/24/17 ECHO - denies Cardiac Cath - denies  Sleep Study - denies CPAP - denies  Blood Thinner Instructions: n/a Aspirin Instructions: n/a  ERAS Protcol -Clear liquids until 1130 DOS PRE-SURGERY Ensure or G2- (1) Pre-surgical ensure   COVID TEST- Pt stated she tested positive on 05/12/21 at [facility] [place]. Patient was started on Paxlovid for treatment. LVM with medical records at [facility] to fax that documentation to [facility].  Based on the guidelines the pt is in the 90 day window to not retest. Therefore, the pt can still have the scheduled procedure. These are the guidelines as follows:  Guidance: Patient previously tested + COVID; now past 90 day window seeking elective surgery (asymptomatic)  Retest patient If negative, proceed with surgery If positive, postpone surgery for 10 days from positive test Patient to quarantine for the (10 days) Do not retest again prior to surgery (even if scheduled a couple of weeks out) Use standard precautions for surgery    Anesthesia review: No  Patient denies shortness of breath, fever, cough and chest pain at PAT appointment   All instructions explained to the patient, with a verbal understanding of the material. Patient agrees to go over the instructions while at home for a better understanding. Patient also instructed to self quarantine after being tested for COVID-19. The opportunity to ask questions was provided.

## 2021-08-08 ENCOUNTER — Telehealth: Payer: Self-pay | Admitting: Orthopaedic Surgery

## 2021-08-08 NOTE — Telephone Encounter (Signed)
Pt asking to be referred to [facility] for P.T. (with Melida Gimenez if possible). Pt used this facility for her last surg in May and would like to use them again. Pt stated she is sch'd for surg tomorrow and told surg scheduler [facility] but was called by another facility instead who she does not want caring for her. Left vm with them to return phone call so we can get the fax number to send the orders to. The best phone number for them is [phone].

## 2021-08-09 ENCOUNTER — Encounter (HOSPITAL_COMMUNITY)
Admission: RE | Disposition: A | Discharge status: Home / Self Care | Payer: Self-pay | Source: Home / Self Care | Attending: Orthopaedic Surgery

## 2021-08-09 ENCOUNTER — Observation Stay (HOSPITAL_COMMUNITY): Payer: Medicare Other

## 2021-08-09 ENCOUNTER — Ambulatory Visit (HOSPITAL_COMMUNITY): Payer: Medicare Other | Admitting: Anesthesiology

## 2021-08-09 ENCOUNTER — Encounter (HOSPITAL_COMMUNITY): Payer: Self-pay | Admitting: Orthopaedic Surgery

## 2021-08-09 ENCOUNTER — Other Ambulatory Visit: Payer: Self-pay

## 2021-08-09 ENCOUNTER — Observation Stay (HOSPITAL_COMMUNITY)
Admission: RE | Admit: 2021-08-09 | Discharge: 2021-08-11 | Disposition: A | Discharge status: Home / Self Care | Payer: Medicare Other | Attending: Orthopaedic Surgery | Admitting: Orthopaedic Surgery

## 2021-08-09 DIAGNOSIS — E039 Hypothyroidism, unspecified: Secondary | ICD-10-CM | POA: Insufficient documentation

## 2021-08-09 DIAGNOSIS — Z96651 Presence of right artificial knee joint: Secondary | ICD-10-CM

## 2021-08-09 DIAGNOSIS — J45909 Unspecified asthma, uncomplicated: Secondary | ICD-10-CM | POA: Diagnosis not present

## 2021-08-09 DIAGNOSIS — M1711 Unilateral primary osteoarthritis, right knee: Principal | ICD-10-CM | POA: Diagnosis present

## 2021-08-09 DIAGNOSIS — Z96652 Presence of left artificial knee joint: Secondary | ICD-10-CM | POA: Insufficient documentation

## 2021-08-09 DIAGNOSIS — I2510 Atherosclerotic heart disease of native coronary artery without angina pectoris: Secondary | ICD-10-CM | POA: Insufficient documentation

## 2021-08-09 DIAGNOSIS — M179 Osteoarthritis of knee, unspecified: Secondary | ICD-10-CM | POA: Diagnosis present

## 2021-08-09 DIAGNOSIS — Z79899 Other long term (current) drug therapy: Secondary | ICD-10-CM | POA: Insufficient documentation

## 2021-08-09 DIAGNOSIS — G8929 Other chronic pain: Secondary | ICD-10-CM | POA: Diagnosis not present

## 2021-08-09 HISTORY — PX: TOTAL KNEE ARTHROPLASTY: SHX125

## 2021-08-09 IMAGING — DX DG KNEE 1-2V PORT*R*
4 series · 4 of 4 positions shown · non-contrast
Comparison: [DATE]

CLINICAL DATA: Right knee replacement

EXAM:
PORTABLE RIGHT KNEE - 1-2 VIEW

[knee ap]
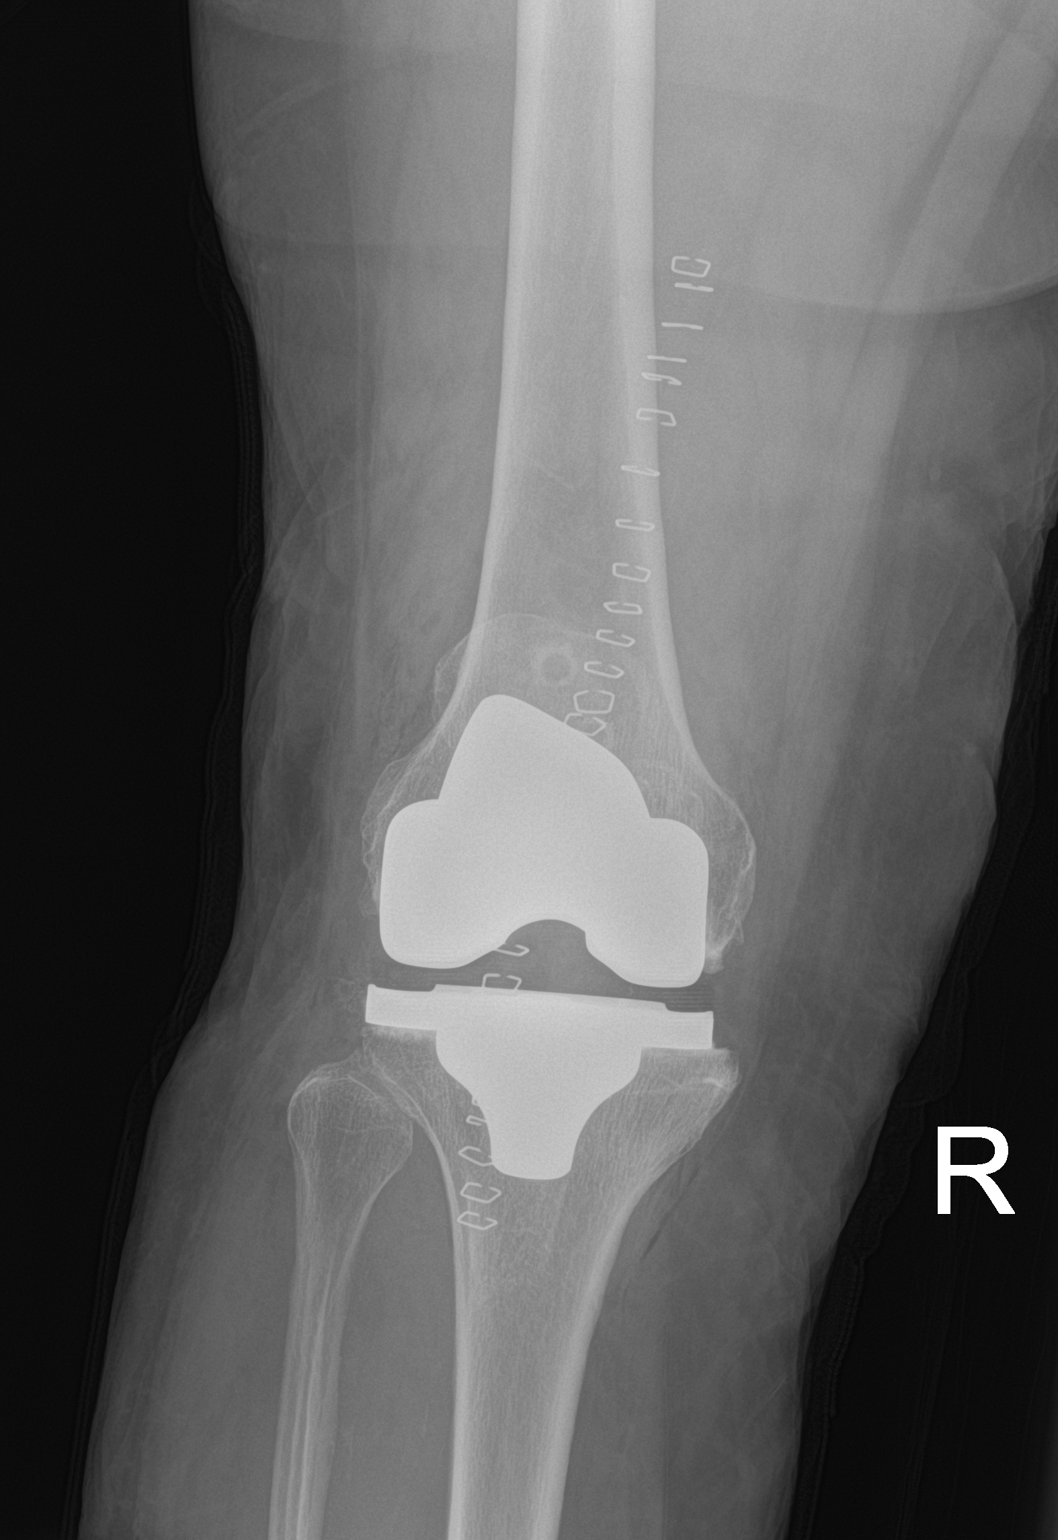

[knee lat]
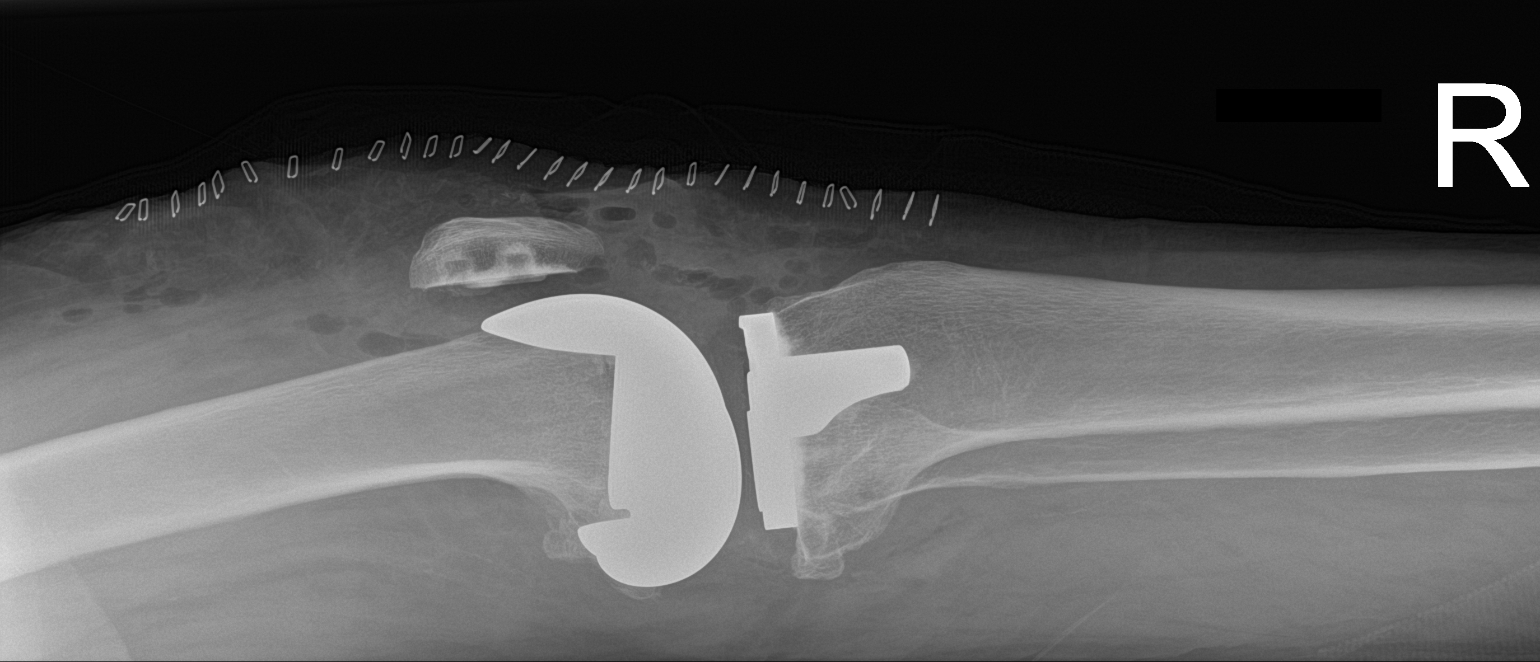

[knee obl (1 of 2)]
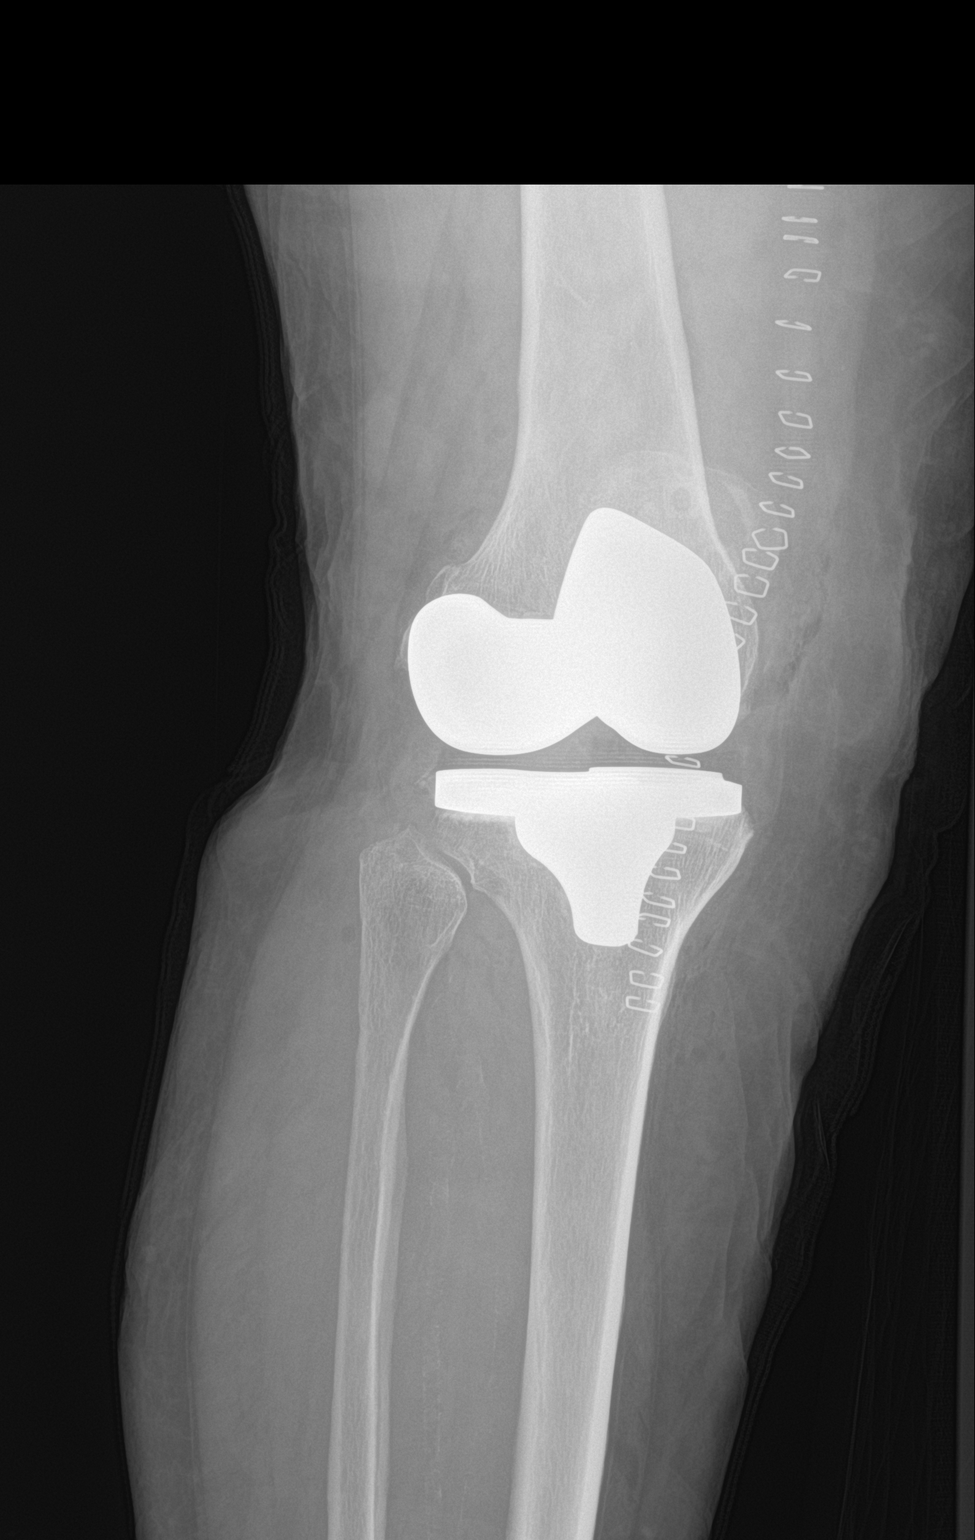

[knee obl (2 of 2)]
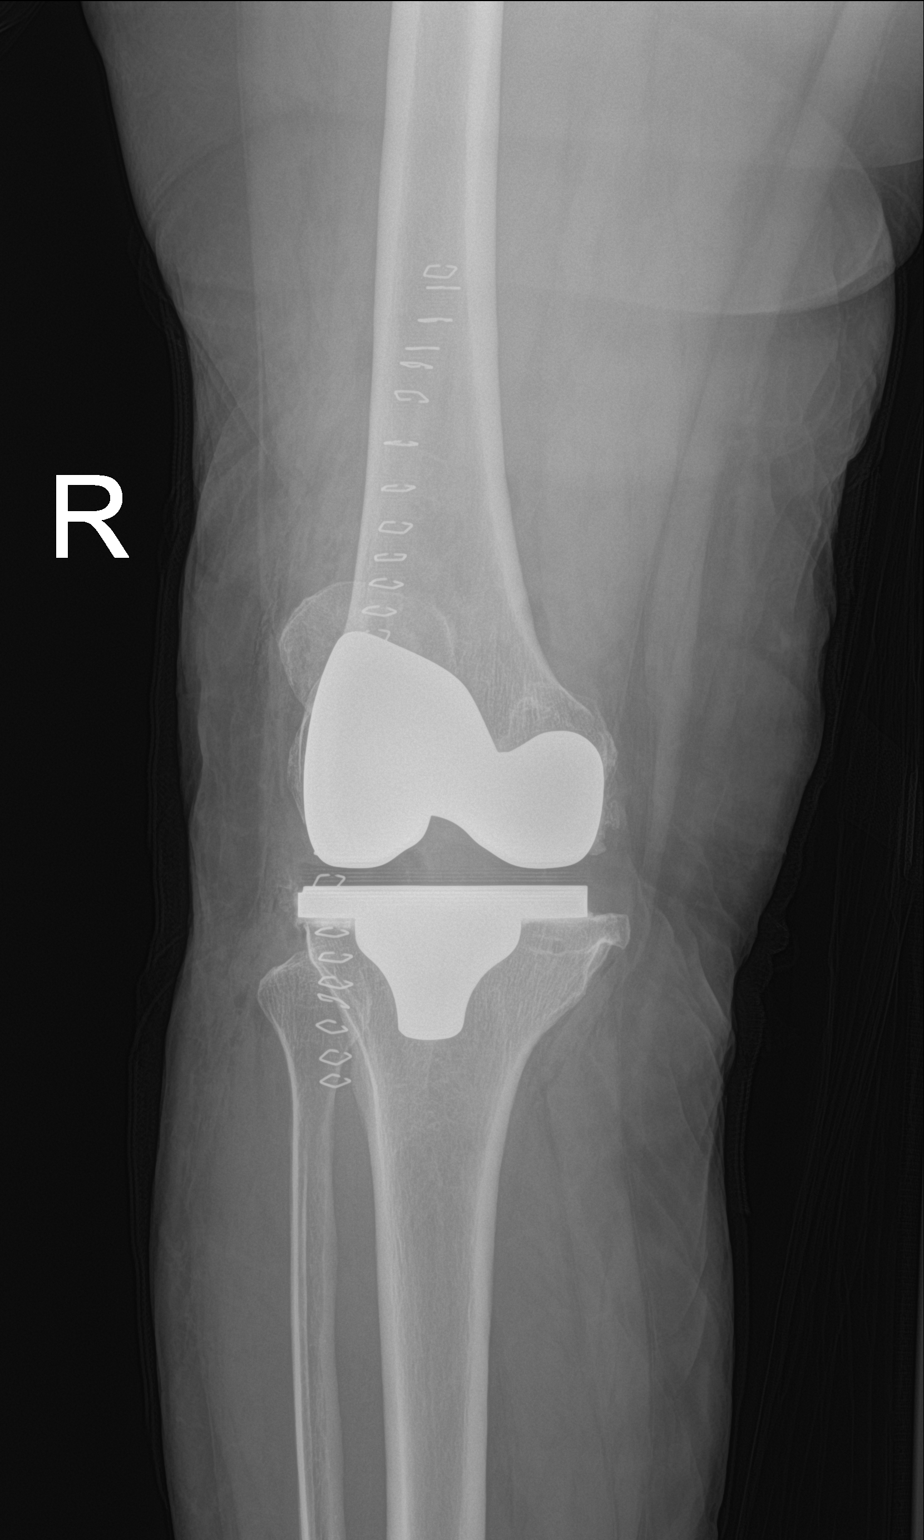

[4 of 4 positions shown; findings below may reference images not displayed]

FINDINGS: Changes of right knee replacement. No hardware or bony complicating
feature. Soft tissue and joint space gas.
IMPRESSION: Right knee replacement.  No visible complicating feature.

## 2021-08-09 SURGERY — ARTHROPLASTY, KNEE, TOTAL
Anesthesia: Spinal | Site: Knee | Laterality: Right

## 2021-08-09 MED ORDER — CLINDAMYCIN PHOSPHATE 600 MG/50ML IV SOLN
600.0000 mg | Freq: Four times a day (QID) | INTRAVENOUS | Status: AC
  Administered 2021-08-09 – 2021-08-10 (×2): 600 mg via INTRAVENOUS
  Filled 2021-08-09 (×2): qty 50

## 2021-08-09 MED ORDER — MIDAZOLAM HCL 2 MG/2ML IJ SOLN
INTRAMUSCULAR | Status: AC
  Filled 2021-08-09: qty 2

## 2021-08-09 MED ORDER — DIPHENHYDRAMINE HCL 12.5 MG/5ML PO ELIX: 12.5000 mg | ORAL_SOLUTION | ORAL | Status: DC | PRN

## 2021-08-09 MED ORDER — PANTOPRAZOLE SODIUM 40 MG PO TBEC
40.0000 mg | DELAYED_RELEASE_TABLET | Freq: Every day | ORAL | Status: DC
  Administered 2021-08-09 – 2021-08-11 (×3): 40 mg via ORAL
  Filled 2021-08-09 (×3): qty 1

## 2021-08-09 MED ORDER — ONDANSETRON HCL 4 MG/2ML IJ SOLN: 4.0000 mg | Freq: Once | INTRAMUSCULAR | Status: DC | PRN

## 2021-08-09 MED ORDER — ONDANSETRON HCL 4 MG PO TABS: 4.0000 mg | ORAL_TABLET | Freq: Four times a day (QID) | ORAL | Status: DC | PRN

## 2021-08-09 MED ORDER — FENTANYL CITRATE (PF) 250 MCG/5ML IJ SOLN
INTRAMUSCULAR | Status: AC
  Filled 2021-08-09: qty 5

## 2021-08-09 MED ORDER — ASPIRIN 81 MG PO CHEW
81.0000 mg | CHEWABLE_TABLET | Freq: Two times a day (BID) | ORAL | Status: DC
  Administered 2021-08-09 – 2021-08-11 (×4): 81 mg via ORAL
  Filled 2021-08-09 (×4): qty 1

## 2021-08-09 MED ORDER — OXYCODONE HCL 5 MG PO TABS: 5.0000 mg | ORAL_TABLET | Freq: Once | ORAL | Status: DC | PRN

## 2021-08-09 MED ORDER — TRANEXAMIC ACID-NACL 1000-0.7 MG/100ML-% IV SOLN
1000.0000 mg | INTRAVENOUS | Status: AC
  Administered 2021-08-09: 1000 mg via INTRAVENOUS

## 2021-08-09 MED ORDER — MIDAZOLAM HCL 2 MG/2ML IJ SOLN: 1.0000 mg | Freq: Once | INTRAMUSCULAR | Status: AC

## 2021-08-09 MED ORDER — FENTANYL CITRATE (PF) 100 MCG/2ML IJ SOLN: 50.0000 ug | Freq: Once | INTRAMUSCULAR | Status: AC

## 2021-08-09 MED ORDER — SODIUM CHLORIDE 0.9 % IV SOLN: INTRAVENOUS | Status: DC

## 2021-08-09 MED ORDER — ROPIVACAINE HCL 5 MG/ML IJ SOLN
INTRAMUSCULAR | Status: DC | PRN
  Administered 2021-08-09: 30 mL via PERINEURAL

## 2021-08-09 MED ORDER — VITAMIN B-12 1000 MCG PO TABS
1000.0000 ug | ORAL_TABLET | Freq: Every day | ORAL | Status: DC
Start: 2021-08-10 — End: 2021-08-11
  Administered 2021-08-10 – 2021-08-11 (×2): 1000 ug via ORAL
  Filled 2021-08-09 (×2): qty 1

## 2021-08-09 MED ORDER — BUPIVACAINE IN DEXTROSE 0.75-8.25 % IT SOLN
INTRATHECAL | Status: DC | PRN
  Administered 2021-08-09: 1.6 mL via INTRATHECAL

## 2021-08-09 MED ORDER — MENTHOL 3 MG MT LOZG: 1.0000 | LOZENGE | OROMUCOSAL | Status: DC | PRN

## 2021-08-09 MED ORDER — MIDAZOLAM HCL 2 MG/2ML IJ SOLN
INTRAMUSCULAR | Status: AC
  Administered 2021-08-09: 1 mg via INTRAVENOUS
  Filled 2021-08-09: qty 2

## 2021-08-09 MED ORDER — 0.9 % SODIUM CHLORIDE (POUR BTL) OPTIME
TOPICAL | Status: DC | PRN
  Administered 2021-08-09: 1000 mL

## 2021-08-09 MED ORDER — CLINDAMYCIN PHOSPHATE 900 MG/50ML IV SOLN
900.0000 mg | INTRAVENOUS | Status: AC
  Administered 2021-08-09: 900 mg via INTRAVENOUS

## 2021-08-09 MED ORDER — OXYCODONE HCL 5 MG PO TABS
5.0000 mg | ORAL_TABLET | ORAL | Status: DC | PRN
  Administered 2021-08-09: 5 mg via ORAL
  Administered 2021-08-10 – 2021-08-11 (×4): 10 mg via ORAL
  Administered 2021-08-11: 5 mg via ORAL
  Administered 2021-08-11: 10 mg via ORAL
  Filled 2021-08-09 (×3): qty 2
  Filled 2021-08-09: qty 1
  Filled 2021-08-09 (×2): qty 2

## 2021-08-09 MED ORDER — METHOCARBAMOL 500 MG PO TABS
500.0000 mg | ORAL_TABLET | Freq: Four times a day (QID) | ORAL | Status: DC | PRN
  Administered 2021-08-09 – 2021-08-11 (×2): 500 mg via ORAL
  Filled 2021-08-09 (×2): qty 1

## 2021-08-09 MED ORDER — CHLORHEXIDINE GLUCONATE 0.12 % MT SOLN
OROMUCOSAL | Status: AC
  Administered 2021-08-09: 15 mL via OROMUCOSAL
  Filled 2021-08-09: qty 15

## 2021-08-09 MED ORDER — METHOCARBAMOL 1000 MG/10ML IJ SOLN
500.0000 mg | Freq: Four times a day (QID) | INTRAVENOUS | Status: DC | PRN
  Filled 2021-08-09: qty 5

## 2021-08-09 MED ORDER — LACTATED RINGERS IV SOLN
INTRAVENOUS | Status: DC
Start: 2021-08-09 — End: 2021-08-09

## 2021-08-09 MED ORDER — SODIUM CHLORIDE 0.9 % IR SOLN
Status: DC | PRN
  Administered 2021-08-09: 3000 mL

## 2021-08-09 MED ORDER — PROPOFOL 500 MG/50ML IV EMUL
INTRAVENOUS | Status: DC | PRN
  Administered 2021-08-09: 50 ug/kg/min via INTRAVENOUS

## 2021-08-09 MED ORDER — METOCLOPRAMIDE HCL 5 MG PO TABS: 5.0000 mg | ORAL_TABLET | Freq: Three times a day (TID) | ORAL | Status: DC | PRN

## 2021-08-09 MED ORDER — ACETAMINOPHEN 325 MG PO TABS: 325.0000 mg | ORAL_TABLET | Freq: Four times a day (QID) | ORAL | Status: DC | PRN

## 2021-08-09 MED ORDER — FENTANYL CITRATE (PF) 100 MCG/2ML IJ SOLN: 25.0000 ug | INTRAMUSCULAR | Status: DC | PRN

## 2021-08-09 MED ORDER — DOCUSATE SODIUM 100 MG PO CAPS
100.0000 mg | ORAL_CAPSULE | Freq: Two times a day (BID) | ORAL | Status: DC
  Administered 2021-08-09 – 2021-08-11 (×4): 100 mg via ORAL
  Filled 2021-08-09 (×4): qty 1

## 2021-08-09 MED ORDER — AMISULPRIDE (ANTIEMETIC) 5 MG/2ML IV SOLN: 10.0000 mg | Freq: Once | INTRAVENOUS | Status: DC | PRN

## 2021-08-09 MED ORDER — TRANEXAMIC ACID-NACL 1000-0.7 MG/100ML-% IV SOLN
INTRAVENOUS | Status: AC
  Filled 2021-08-09: qty 100

## 2021-08-09 MED ORDER — OXYCODONE HCL 5 MG/5ML PO SOLN: 5.0000 mg | Freq: Once | ORAL | Status: DC | PRN

## 2021-08-09 MED ORDER — PHENYLEPHRINE HCL-NACL 20-0.9 MG/250ML-% IV SOLN
INTRAVENOUS | Status: DC | PRN
  Administered 2021-08-09: 25 ug/min via INTRAVENOUS

## 2021-08-09 MED ORDER — ASCORBIC ACID 500 MG PO TABS
500.0000 mg | ORAL_TABLET | Freq: Every day | ORAL | Status: DC
  Administered 2021-08-10 – 2021-08-11 (×2): 500 mg via ORAL
  Filled 2021-08-09 (×2): qty 1

## 2021-08-09 MED ORDER — PHENYLEPHRINE HCL (PRESSORS) 10 MG/ML IV SOLN
INTRAVENOUS | Status: AC
  Filled 2021-08-09: qty 2

## 2021-08-09 MED ORDER — ONDANSETRON HCL 4 MG/2ML IJ SOLN: 4.0000 mg | Freq: Four times a day (QID) | INTRAMUSCULAR | Status: DC | PRN

## 2021-08-09 MED ORDER — FENTANYL CITRATE (PF) 100 MCG/2ML IJ SOLN
INTRAMUSCULAR | Status: AC
  Administered 2021-08-09: 50 ug via INTRAVENOUS
  Filled 2021-08-09: qty 2

## 2021-08-09 MED ORDER — PROPOFOL 10 MG/ML IV BOLUS
INTRAVENOUS | Status: DC | PRN
  Administered 2021-08-09: 10 mg via INTRAVENOUS
  Administered 2021-08-09 (×2): 20 mg via INTRAVENOUS

## 2021-08-09 MED ORDER — METOCLOPRAMIDE HCL 5 MG/ML IJ SOLN: 5.0000 mg | Freq: Three times a day (TID) | INTRAMUSCULAR | Status: DC | PRN

## 2021-08-09 MED ORDER — POVIDONE-IODINE 10 % EX SWAB
2.0000 "application " | Freq: Once | CUTANEOUS | Status: AC
  Administered 2021-08-09: 2 via TOPICAL

## 2021-08-09 MED ORDER — ORAL CARE MOUTH RINSE: 15.0000 mL | Freq: Once | OROMUCOSAL | Status: AC

## 2021-08-09 MED ORDER — ZINC GLUCONATE 50 MG PO TABS: 50.0000 mg | ORAL_TABLET | ORAL | Status: DC

## 2021-08-09 MED ORDER — PHENOL 1.4 % MT LIQD: 1.0000 | OROMUCOSAL | Status: DC | PRN

## 2021-08-09 MED ORDER — HYDROMORPHONE HCL 1 MG/ML IJ SOLN
0.5000 mg | INTRAMUSCULAR | Status: DC | PRN
  Administered 2021-08-09 – 2021-08-10 (×3): 1 mg via INTRAVENOUS
  Filled 2021-08-09 (×3): qty 1

## 2021-08-09 MED ORDER — OXYCODONE HCL 5 MG PO TABS
10.0000 mg | ORAL_TABLET | ORAL | Status: DC | PRN
  Administered 2021-08-09: 15 mg via ORAL
  Filled 2021-08-09: qty 3
  Filled 2021-08-09: qty 2

## 2021-08-09 MED ORDER — CLINDAMYCIN PHOSPHATE 900 MG/50ML IV SOLN
INTRAVENOUS | Status: AC
  Filled 2021-08-09: qty 50

## 2021-08-09 MED ORDER — EPHEDRINE SULFATE-NACL 50-0.9 MG/10ML-% IV SOSY
PREFILLED_SYRINGE | INTRAVENOUS | Status: DC | PRN
  Administered 2021-08-09: 5 mg via INTRAVENOUS

## 2021-08-09 MED ORDER — ALUM & MAG HYDROXIDE-SIMETH 200-200-20 MG/5ML PO SUSP: 30.0000 mL | ORAL | Status: DC | PRN

## 2021-08-09 MED ORDER — LEVOTHYROXINE SODIUM 100 MCG PO TABS
100.0000 ug | ORAL_TABLET | Freq: Every day | ORAL | Status: DC
  Administered 2021-08-10: 100 ug via ORAL
  Filled 2021-08-09 (×2): qty 1

## 2021-08-09 MED ORDER — CHLORHEXIDINE GLUCONATE 0.12 % MT SOLN: 15.0000 mL | Freq: Once | OROMUCOSAL | Status: AC

## 2021-08-09 SURGICAL SUPPLY — 59 items
BAG COUNTER SPONGE SURGICOUNT (BAG) ×2 IMPLANT
BANDAGE ESMARK 6X9 LF (GAUZE/BANDAGES/DRESSINGS) ×1 IMPLANT
BASEPLATE TIBIAL TRIATHALON 3 (Plate) ×2 IMPLANT
BEARIN INSERT TIBIAL SZ 3 11 (Insert) ×2 IMPLANT
BEARING INSERT TIBIAL SZ 3 11 (Insert) ×1 IMPLANT
BLADE SAG 18X100X1.27 (BLADE) ×2 IMPLANT
BNDG ELASTIC 6X5.8 VLCR STR LF (GAUZE/BANDAGES/DRESSINGS) ×2 IMPLANT
BNDG ESMARK 6X9 LF (GAUZE/BANDAGES/DRESSINGS) ×2
BOWL SMART MIX CTS (DISPOSABLE) ×4 IMPLANT
CEMENT BONE SIMPLEX SPEEDSET (Cement) ×4 IMPLANT
COVER SURGICAL LIGHT HANDLE (MISCELLANEOUS) ×2 IMPLANT
CUFF TOURN SGL QUICK 34 (TOURNIQUET CUFF) ×1
CUFF TRNQT CYL 34X4.125X (TOURNIQUET CUFF) ×1 IMPLANT
DRAPE EXTREMITY T 121X128X90 (DISPOSABLE) ×2 IMPLANT
DRAPE HALF SHEET 40X57 (DRAPES) ×2 IMPLANT
DRAPE U-SHAPE 47X51 STRL (DRAPES) ×2 IMPLANT
DRSG PAD ABDOMINAL 8X10 ST (GAUZE/BANDAGES/DRESSINGS) ×2 IMPLANT
DURAPREP 26ML APPLICATOR (WOUND CARE) ×4 IMPLANT
ELECT CAUTERY BLADE 6.4 (BLADE) ×2 IMPLANT
ELECT REM PT RETURN 9FT ADLT (ELECTROSURGICAL) ×2
ELECTRODE REM PT RTRN 9FT ADLT (ELECTROSURGICAL) ×1 IMPLANT
FACESHIELD WRAPAROUND (MASK) ×4 IMPLANT
FEMORAL PEG DISTAL FIXATION (Orthopedic Implant) ×2 IMPLANT
FEMORAL POST STABILIZED NO 3 (Orthopedic Implant) ×2 IMPLANT
GAUZE SPONGE 4X4 12PLY STRL (GAUZE/BANDAGES/DRESSINGS) ×2 IMPLANT
GAUZE XEROFORM 1X8 LF (GAUZE/BANDAGES/DRESSINGS) ×2 IMPLANT
GLOVE SRG 8 PF TXTR STRL LF DI (GLOVE) ×2 IMPLANT
GLOVE SURG ORTHO LTX SZ7.5 (GLOVE) ×2 IMPLANT
GLOVE SURG ORTHO LTX SZ8 (GLOVE) ×2 IMPLANT
GLOVE SURG UNDER POLY LF SZ8 (GLOVE) ×2
GOWN STRL REUS W/ TWL LRG LVL3 (GOWN DISPOSABLE) ×2 IMPLANT
GOWN STRL REUS W/ TWL XL LVL3 (GOWN DISPOSABLE) ×2 IMPLANT
GOWN STRL REUS W/TWL LRG LVL3 (GOWN DISPOSABLE) ×2
GOWN STRL REUS W/TWL XL LVL3 (GOWN DISPOSABLE) ×2
HANDPIECE INTERPULSE COAX TIP (DISPOSABLE) ×1
IMMOBILIZER KNEE 22 UNIV (SOFTGOODS) ×2 IMPLANT
KIT BASIN OR (CUSTOM PROCEDURE TRAY) ×2 IMPLANT
KIT TURNOVER KIT B (KITS) ×2 IMPLANT
MANIFOLD NEPTUNE II (INSTRUMENTS) ×4 IMPLANT
NS IRRIG 1000ML POUR BTL (IV SOLUTION) ×2 IMPLANT
PACK TOTAL JOINT (CUSTOM PROCEDURE TRAY) ×2 IMPLANT
PAD ARMBOARD 7.5X6 YLW CONV (MISCELLANEOUS) ×2 IMPLANT
PADDING CAST COTTON 6X4 STRL (CAST SUPPLIES) ×2 IMPLANT
PATELLA 32MMX10MM (Knees) ×2 IMPLANT
PIN FLUTED HEDLESS FIX 3.5X1/8 (PIN) ×2 IMPLANT
SET HNDPC FAN SPRY TIP SCT (DISPOSABLE) ×1 IMPLANT
SET PAD KNEE POSITIONER (MISCELLANEOUS) ×2 IMPLANT
SPONGE T-LAP 18X18 ~~LOC~~+RFID (SPONGE) ×4 IMPLANT
STAPLER VISISTAT 35W (STAPLE) ×2 IMPLANT
SUT VIC AB 0 CT1 27 (SUTURE) ×3
SUT VIC AB 0 CT1 27XBRD ANBCTR (SUTURE) ×3 IMPLANT
SUT VIC AB 1 CT1 27 (SUTURE) ×2
SUT VIC AB 1 CT1 27XBRD ANBCTR (SUTURE) ×2 IMPLANT
SUT VIC AB 2-0 CT1 27 (SUTURE) ×4
SUT VIC AB 2-0 CT1 TAPERPNT 27 (SUTURE) ×4 IMPLANT
SYR 50ML LL SCALE MARK (SYRINGE) IMPLANT
TOWEL GREEN STERILE (TOWEL DISPOSABLE) ×2 IMPLANT
TOWEL GREEN STERILE FF (TOWEL DISPOSABLE) ×2 IMPLANT
WRAP KNEE MAXI GEL POST OP (GAUZE/BANDAGES/DRESSINGS) ×2 IMPLANT

## 2021-08-09 NOTE — Anesthesia Procedure Notes (Signed)
Spinal  Patient location during procedure: OR Reason for block: surgical anesthesia Staffing Performed: anesthesiologist  Anesthesiologist: Charlayne Vultaggio E, MD Preanesthetic Checklist Completed: patient identified, IV checked, risks and benefits discussed, surgical consent, monitors and equipment checked, pre-op evaluation and timeout performed Spinal Block Patient position: sitting Prep: DuraPrep and site prepped and draped Patient monitoring: continuous pulse ox, blood pressure and heart rate Approach: midline Location: L3-4 Injection technique: single-shot Needle Needle type: Pencan  Needle gauge: 24 G Needle length: 9 cm Assessment Events: CSF return Additional Notes Functioning IV was confirmed and monitors were applied. Sterile prep and drape, including hand hygiene and sterile gloves were used. The patient was positioned and the spine was prepped. The skin was anesthetized with lidocaine.  Free flow of clear CSF was obtained prior to injecting local anesthetic into the CSF. The needle was carefully withdrawn. The patient tolerated the procedure well.     

## 2021-08-09 NOTE — Anesthesia Postprocedure Evaluation (Signed)
Anesthesia Post Note  Patient: Katie Weber  Procedure(s) Performed: RIGHT TOTAL KNEE ARTHROPLASTY (Right: Knee)     Patient location during evaluation: PACU Anesthesia Type: Spinal Level of consciousness: oriented and awake and alert Pain management: pain level controlled Vital Signs Assessment: post-procedure vital signs reviewed and stable Respiratory status: spontaneous breathing, respiratory function stable and nonlabored ventilation Cardiovascular status: blood pressure returned to baseline and stable Postop Assessment: no headache, no backache, no apparent nausea or vomiting and spinal receding Anesthetic complications: no   No notable events documented.  Last Vitals:  Vitals:   08/09/21 1656 08/09/21 1711  BP: (!) 101/56 107/66  Pulse: (!) 57 66  Resp: 13 11  Temp:    SpO2: 97% 97%    Last Pain:  Vitals:   08/09/21 1641  TempSrc:   PainSc: 0-No pain                 Lidia Collum

## 2021-08-09 NOTE — Transfer of Care (Signed)
Immediate Anesthesia Transfer of Care Note  Patient: Syriah Delisi  Procedure(s) Performed: RIGHT TOTAL KNEE ARTHROPLASTY (Right: Knee)  Patient Location: PACU  Anesthesia Type:MAC and Spinal  Level of Consciousness: awake, alert  and oriented  Airway & Oxygen Therapy: Patient Spontanous Breathing  Post-op Assessment: Report given to RN and Post -op Vital signs reviewed and stable  Post vital signs: Reviewed and stable  Last Vitals:  Vitals Value Taken Time  BP 98/62 08/09/21 1641  Temp    Pulse 67 08/09/21 1643  Resp 17 08/09/21 1643  SpO2 98 % 08/09/21 1643  Vitals shown include unvalidated device data.  Last Pain:  Vitals:   08/09/21 1237  TempSrc:   PainSc: 0-No pain      Patients Stated Pain Goal: 0 (62/22/97 9892)  Complications: No notable events documented.

## 2021-08-09 NOTE — Anesthesia Procedure Notes (Signed)
Anesthesia Regional Block: Adductor canal block   Pre-Anesthetic Checklist: , timeout performed,  Correct Patient, Correct Site, Correct Laterality,  Correct Procedure, Correct Position, site marked,  Risks and benefits discussed,  Surgical consent,  Pre-op evaluation,  At surgeon's request and post-op pain management  Laterality: Right  Prep: chloraprep       Needles:  Injection technique: Single-shot  Needle Type: Echogenic Stimulator Needle     Needle Length: 10cm  Needle Gauge: 20     Additional Needles:   Procedures:,,,, ultrasound used (permanent image in chart),,    Narrative:  Start time: 08/09/2021 1:25 PM End time: 08/09/2021 1:29 PM Injection made incrementally with aspirations every 5 mL.  Performed by: Personally  Anesthesiologist: Lidia Collum, MD  Additional Notes: Standard monitors applied. Skin prepped. Good needle visualization with ultrasound. Injection made in 5cc increments with no resistance to injection. Patient tolerated the procedure well.

## 2021-08-09 NOTE — H&P (Signed)
TOTAL KNEE ADMISSION H&P  Patient is being admitted for right total knee arthroplasty.  Subjective:  Chief Complaint:right knee pain.  HPI: Katie Weber, 75 y.o. female, has a history of pain and functional disability in the right knee due to arthritis and has failed non-surgical conservative treatments for greater than 12 weeks to includeNSAID's and/or analgesics, corticosteriod injections, viscosupplementation injections, flexibility and strengthening excercises, supervised PT with diminished ADL's post treatment, use of assistive devices, weight reduction as appropriate, and activity modification.  Onset of symptoms was gradual, starting 2 years ago with gradually worsening course since that time. The patient noted no past surgery on the right knee(s).  Patient currently rates pain in the right knee(s) at 10 out of 10 with activity. Patient has night pain, worsening of pain with activity and weight bearing, pain that interferes with activities of daily living, pain with passive range of motion, crepitus, and joint swelling.  Patient has evidence of joint space narrowing and osteophytes by imaging studies. There is no active infection.  Patient Active Problem List   Diagnosis Date Noted   Unilateral primary osteoarthritis, right knee 06/06/2021   Status post total left knee replacement 02/15/2021   Unilateral primary osteoarthritis, left knee 01/04/2021   Coronary artery calcification 04/24/2017   Hyperlipidemia 04/24/2017   Acute bronchitis 09/29/2014   SBO (small bowel obstruction) ([place]) 09/27/2014   Nausea and vomiting 09/27/2014   Hypothyroidism 09/27/2014   Leukocytosis 09/27/2014   Past Medical History:  Diagnosis Date   Anemia    when pt was younger   Arthritis    arthritis fingers   Asthma    Coronary artery calcification 04/24/2017   Diverticulitis    x2 -last 1'17 tx. outpatient.   Environmental and seasonal allergies    2014 Possibly related to air freshners.    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   Family history of anesthesia complication 16WVP ago   daughter had an allergic reaction to atropine   GERD (gastroesophageal reflux disease)    no meds   History of hiatal hernia 09/14/2014   seen on xray   History of kidney stones    '80's lithotripsy-[facility] x1   History of kidney stones    x1   Hyperlipidemia    Hypothyroidism    Pneumonia 2014   Pure hypercholesterolemia    Tremor of left hand     Past Surgical History:  Procedure Laterality Date   BALLOON DILATION N/A 05/04/2014   Procedure: BALLOON DILATION;  Surgeon: Garlan Fair, MD;  Location: [facility];  Service: Endoscopy;  Laterality: N/A;   BREAST LUMPECTOMY Right 1972   benign    BREAST SURGERY Right    '72 -benign tumor   childbirth- NVD x2      COLONOSCOPY WITH PROPOFOL N/A 05/04/2014   Procedure: COLONOSCOPY WITH PROPOFOL;  Surgeon: Garlan Fair, MD;  Location: [facility];  Service: Endoscopy;  Laterality: N/A;   DILATION AND CURETTAGE OF UTERUS     04-20-14 [facility] hospital endometrial polyp removed.   ESOPHAGOGASTRODUODENOSCOPY (EGD) WITH PROPOFOL N/A 05/04/2014   Procedure: ESOPHAGOGASTRODUODENOSCOPY (EGD) WITH PROPOFOL;  Surgeon: Garlan Fair, MD;  Location: [facility];  Service: Endoscopy;  Laterality: N/A;. Procedure done in [place], [place]- 5 yrs ago.   HYSTEROSCOPY WITH D & C N/A 04/20/2014   Procedure: DILATATION AND CURETTAGE /HYSTEROSCOPY;  Surgeon: [name], MD;  Location: [facility];  Service: Gynecology;  Laterality:  N/A;   LITHOTRIPSY     POLYPECTOMY  2020   Dr. Lear Ng   TOTAL KNEE ARTHROPLASTY Left 02/15/2021   Procedure: LEFT TOTAL KNEE ARTHROPLASTY;  Surgeon: Mcarthur Rossetti, MD;  Location: [place];  Service: Orthopedics;  Laterality: Left;   TUBAL LIGATION      Current Facility-Administered Medications  Medication Dose Route Frequency Provider Last Rate  Last Admin   clindamycin (CLEOCIN) 900 MG/50ML IVPB            [START ON 08/10/2021] clindamycin (CLEOCIN) IVPB 900 mg  900 mg Intravenous On Call to OR Pete Pelt, PA-C       lactated ringers infusion   Intravenous Continuous Albertha Ghee, MD 10 mL/hr at 08/09/21 1244 New Bag at 08/09/21 1244   tranexamic acid (CYKLOKAPRON) 1000MG /178mL IVPB            tranexamic acid (CYKLOKAPRON) IVPB 1,000 mg  1,000 mg Intravenous To OR Pete Pelt, PA-C       Facility-Administered Medications Ordered in Other Encounters  Medication Dose Route Frequency Provider Last Rate Last Admin   ropivacaine (PF) 5 mg/mL (0.5%) (NAROPIN) injection   Peri-NEURAL Anesthesia Intra-op Lidia Collum, MD   30 mL at 08/09/21 1329   Allergies  Allergen Reactions   Contrast Media [Iodinated Diagnostic Agents] Shortness Of Breath and Other (See Comments)    Tightness in chest. Trouble breathing   Penicillins Hives    .Marland KitchenHas patient had a PCN reaction causing immediate rash, facial/tongue/throat swelling, SOB or lightheadedness with hypotension: No Has patient had a PCN reaction causing severe rash involving mucus membranes or skin necrosis: No Has patient had a PCN reaction that required hospitalization No Has patient had a PCN reaction occurring within the last 10 years: No If all of the above answers are "NO", then may proceed with Cephalosporin use.     Social History   Tobacco Use   Smoking status: Never   Smokeless tobacco: Never  Substance Use Topics   Alcohol use: Yes    Comment: rare social    Family History  Problem Relation Age of Onset   Cancer Other    Endometrial cancer Mother    CAD Father    Breast cancer Maternal Aunt      Review of Systems  Musculoskeletal:  Positive for gait problem and joint swelling.  All other systems reviewed and are negative.  Objective:  Physical Exam Vitals reviewed.  Constitutional:      Appearance: Normal appearance.  HENT:     Head:  Normocephalic and atraumatic.  Eyes:     Extraocular Movements: Extraocular movements intact.     Pupils: Pupils are equal, round, and reactive to light.  Cardiovascular:     Rate and Rhythm: Normal rate.  Pulmonary:     Effort: Pulmonary effort is normal.     Breath sounds: Normal breath sounds.  Abdominal:     Palpations: Abdomen is soft.  Musculoskeletal:     Cervical back: Normal range of motion and neck supple.     Right knee: Effusion, bony tenderness and crepitus present. Decreased range of motion. Tenderness present over the medial joint line, lateral joint line and patellar tendon. Abnormal alignment and abnormal meniscus.  Neurological:     Mental Status: She is alert and oriented to person, place, and time.  Psychiatric:        Behavior: Behavior normal.    Vital signs in last 24 hours: Temp:  [97.6 F (36.4  C)] 97.6 F (36.4 C) (11/01 1223) Pulse Rate:  [86] 86 (11/01 1223) Resp:  [18] 18 (11/01 1223) BP: (166)/(75) 166/75 (11/01 1223) SpO2:  [96 %] 96 % (11/01 1223) Weight:  [81.2 kg] 81.2 kg (11/01 1223)  Labs:   Estimated body mass index is 31.21 kg/m as calculated from the following:   Height as of this encounter: 5' 3.5" (1.613 m).   Weight as of this encounter: 81.2 kg.   Imaging Review Plain radiographs demonstrate severe degenerative joint disease of the right knee(s). The overall alignment ismild varus. The bone quality appears to be good for age and reported activity level.      Assessment/Plan:  End stage arthritis, right knee   The patient history, physical examination, clinical judgment of the provider and imaging studies are consistent with end stage degenerative joint disease of the right knee(s) and total knee arthroplasty is deemed medically necessary. The treatment options including medical management, injection therapy arthroscopy and arthroplasty were discussed at length. The risks and benefits of total knee arthroplasty were presented  and reviewed. The risks due to aseptic loosening, infection, stiffness, patella tracking problems, thromboembolic complications and other imponderables were discussed. The patient acknowledged the explanation, agreed to proceed with the plan and consent was signed. Patient is being admitted for inpatient treatment for surgery, pain control, PT, OT, prophylactic antibiotics, VTE prophylaxis, progressive ambulation and ADL's and discharge planning. The patient is planning to be discharged home with home health services

## 2021-08-09 NOTE — Brief Op Note (Signed)
08/09/2021  4:14 PM  PATIENT:  Katie Weber  75 y.o. female  PRE-OPERATIVE DIAGNOSIS:  osteoarthritis right knee  POST-OPERATIVE DIAGNOSIS:  osteoarthritis right knee  PROCEDURE:  Procedure(s) with comments: RIGHT TOTAL KNEE ARTHROPLASTY (Right) - Needs RNFA  SURGEON:  Surgeon(s) and Role:    * Mcarthur Rossetti, MD - Primary  ASSISTANTS: Ky Barban, RNFA   ANESTHESIA:   regional and spinal  COUNTS:  YES  TOURNIQUET:   Total Tourniquet Time Documented: Thigh (Right) - 52 minutes Total: Thigh (Right) - 52 minutes   DICTATION: .Other Dictation: Dictation Number [id number]  PLAN OF CARE: Admit for overnight observation  PATIENT DISPOSITION:  PACU - hemodynamically stable.   Delay start of Pharmacological VTE agent (>24hrs) due to surgical blood loss or risk of bleeding: no

## 2021-08-09 NOTE — Anesthesia Preprocedure Evaluation (Signed)
Anesthesia Evaluation  Patient identified by MRN, date of birth, ID band Patient awake    Reviewed: Allergy & Precautions, NPO status , Patient's Chart, lab work & pertinent test results  History of Anesthesia Complications Negative for: history of anesthetic complications  Airway Mallampati: II  TM Distance: >3 FB Neck ROM: Full    Dental  (+) Teeth Intact   Pulmonary asthma ,    Pulmonary exam normal        Cardiovascular + CAD (by calcium score; negative stress test 2018)  Normal cardiovascular exam  HLD   Neuro/Psych negative neurological ROS     GI/Hepatic Neg liver ROS, hiatal hernia, GERD  ,  Endo/Other  Hypothyroidism   Renal/GU negative Renal ROS  negative genitourinary   Musculoskeletal  (+) Arthritis ,   Abdominal   Peds  Hematology negative hematology ROS (+)   Anesthesia Other Findings  plt [id number]  Reproductive/Obstetrics                            Anesthesia Physical Anesthesia Plan  ASA: 2  Anesthesia Plan: Spinal   Post-op Pain Management:  Regional for Post-op pain   Induction:   PONV Risk Score and Plan: 2 and Propofol infusion, Treatment may vary due to age or medical condition, Ondansetron and TIVA  Airway Management Planned: Nasal Cannula and Simple Face Mask  Additional Equipment: None  Intra-op Plan:   Post-operative Plan:   Informed Consent: I have reviewed the patients History and Physical, chart, labs and discussed the procedure including the risks, benefits and alternatives for the proposed anesthesia with the patient or authorized representative who has indicated his/her understanding and acceptance.       Plan Discussed with:   Anesthesia Plan Comments:        Anesthesia Quick Evaluation

## 2021-08-09 NOTE — Op Note (Signed)
Katie Weber, GRILLOT MEDICAL RECORD NO: [id number] ACCOUNT NO: [id number] DATE OF BIRTH: 1946/03/22 FACILITY: [facility] LOCATION: [id number] PHYSICIAN: Lind Guest. [name], MD  Operative Report   DATE OF PROCEDURE: 08/09/2021  PREOPERATIVE DIAGNOSIS:  Primary osteoarthritis and degenerative joint disease, right knee.  POSTOPERATIVE DIAGNOSIS:  Primary osteoarthritis and degenerative joint disease, right knee.  PROCEDURE:  Right total knee arthroplasty.  IMPLANTS:  Stryker Triathlon cemented knee system with size 3 femur, size 3 tibial tray, 11 mm thickness fixed bearing polyethylene insert, size 32 patellar button.  SURGEON:  Jean Rosenthal, M.D.  ASSISTANT: Ky Barban, RNFA.  ANTIBIOTICS:  2 g IV Ancef.  TOURNIQUET TIME:  Under one hour.  BLOOD LOSS:  Less than 100 mL.  COMPLICATIONS:  None.  INDICATIONS:  The patient is a 75 year old female well known to me.  She has debilitating arthritis in both her knees and actually successfully had replaced her left knee earlier this year.  Her right knee has well documented arthritis.  The pain is  daily and has detrimentally affected her mobility, her quality of life and activities of daily living.  Given the success of her left total knee arthroplasty, she wishes to have this done on the right side given the failure of conservative treatment and  her daily pain.  Having had this done before, she is fully aware of the risk of acute blood loss anemia, nerve or vessel injury, fracture, infection, DVT, implant failure and skin and soft tissue issues.  She understands our goals are to decrease pain,  improve mobility and overall improve quality of life.  DESCRIPTION OF PROCEDURE:  After informed consent was obtained, appropriate right knee was marked.  Anesthesia obtained an adductor canal block and adductor canal block in the holding room in the right lower extremity.  She was then brought to the  operating room and sat up on the  operating table.  Spinal anesthesia was obtained.  She was then laid in supine position on the operating table.  Foley catheter was placed and a nonsterile tourniquet was placed around her upper right thigh.  Her right  thigh, knee, leg, ankle, foot were prepped and draped in DuraPrep and sterile drapes including a sterile stockinette.  A timeout was called and she was identified as the correct patient, correct right knee.  We then used Esmarch to wrap that leg and  tourniquet inflated to 250 mm of pressure.  I then made a direct midline incision over the patella and carried this proximally and distally.  We dissected the knee joint, carried out a medial parapatellar arthrotomy, finding a moderate joint effusion and  significant periarticular osteophytes in all 3 compartments of the knee and most of the wear was at the medial compartment and the patellofemoral joint. With the knee in a flexed position, we removed osteophytes from all three compartments.  We removed  the Hoffa's fat pad and the ACL and PCL as well as medial and lateral meniscus.  We then used extramedullary cutting guide for making our proximal tibia cut, correcting for varus and valgus and neutral slope.  We set the cutting block to take 9 mm off  the high side.  We made this cut without difficulty.  We then went to the femur and using intramedullary guide for the femur, making our distal femoral cut for 10 mm distal femoral cut.  We chose this for a right knee at 5 degrees externally rotated.  We  made that cut without difficulty, and brought the knee  back down to full extension with a 9 mm extension block and achieved full extension.  We then went back to the femur and put our femoral sizing guide based off the epicondylar axis.  Based off of  this, we chose a size 3 femur.  We made a 4-in-1 cutting block for a size 3 femur and we placed a 4-in-1 cutting block for a size 3 femur, made our anterior and posterior cuts followed by our chamfer  cuts.  We then did our femoral box cut off the 3.   Attention was then turned  back to the tibia.  We chose a size 3 tibia for coverage of the plateau.  We did our keel punch over this, setting the rotation off the tibial tubercle and the femur. With a size 3 tibial trial, we trialled a size 3 right femur  and then a 9 and then finally 11 mm fixed bearing polyethylene insert.  We were pleased with the stability, and range of motion with a 9 mm insert.  We then made our patellar cut and drilled three holes for a size 32 patellar button.  With all trial  instrumentation in the knee, I put it through several cycles of motion.  We were pleased with range of motion and stability.  We then removed all instrumentation from the knee and irrigated the knee with normal saline solution using pulsatile lavage.   With the knee in a flexed position, we dried it real well and mixed our cement.  We then cemented our real Stryker Triathlon tibial tray, size 3, followed by our size 3 right femur, we cemented. We removed excess cement debris from the knee and placed  our 11 mm fixed bearing polyethylene insert and cemented our patellar button, size 32.  We then held the knee completely extended and compressed to allow the cement to harden.  Once it hardened, we let the tourniquet down and hemostasis was obtained with  electrocautery, removed other cement debris from the knee and then closed the arthrotomy with interrupted #1 Vicryl suture followed by 0 Vicryl to close the deep tissue and 2-0 Vicryl to close the subcutaneous tissue.  The skin was reapproximated with  staples.  Xeroform well-padded sterile dressing was applied.  She was taken to recovery room in stable condition with all final counts being correct.  There were no complications noted.   MUK D: 08/09/2021 4:13:19 pm T: 08/09/2021 11:56:00 pm  JOB: [id number]/ [id number]

## 2021-08-09 NOTE — Anesthesia Procedure Notes (Signed)
Procedure Name: MAC Date/Time: 08/09/2021 2:55 PM Performed by: Dorthea Cove, CRNA Pre-anesthesia Checklist: Patient identified, Emergency Drugs available, Suction available, Patient being monitored and Timeout performed Patient Re-evaluated:Patient Re-evaluated prior to induction Oxygen Delivery Method: Simple face mask Preoxygenation: Pre-oxygenation with 100% oxygen Induction Type: IV induction Placement Confirmation: positive ETCO2 and CO2 detector

## 2021-08-09 NOTE — Plan of Care (Signed)
PT. Educated about unit and taught about medication

## 2021-08-09 NOTE — Plan of Care (Signed)

## 2021-08-10 DIAGNOSIS — M1711 Unilateral primary osteoarthritis, right knee: Secondary | ICD-10-CM | POA: Diagnosis not present

## 2021-08-10 LAB — CBC
HCT: 33.4 % — ABNORMAL LOW (ref 36.0–46.0)
Hemoglobin: 10.3 g/dL — ABNORMAL LOW (ref 12.0–15.0)
MCH: 27.2 pg (ref 26.0–34.0)
MCHC: 30.8 g/dL (ref 30.0–36.0)
MCV: 88.4 fL (ref 80.0–100.0)
Platelets: 252 K/uL (ref 150–400)
RBC: 3.78 MIL/uL — ABNORMAL LOW (ref 3.87–5.11)
RDW: 16.4 % — ABNORMAL HIGH (ref 11.5–15.5)
WBC: 13.1 K/uL — ABNORMAL HIGH (ref 4.0–10.5)
nRBC: 0 % (ref 0.0–0.2)

## 2021-08-10 LAB — BASIC METABOLIC PANEL
Anion gap: 5 (ref 5–15)
BUN: 10 mg/dL (ref 8–23)
CO2: 24 mmol/L (ref 22–32)
Calcium: 8.5 mg/dL — ABNORMAL LOW (ref 8.9–10.3)
Chloride: 106 mmol/L (ref 98–111)
Creatinine, Ser: 0.95 mg/dL (ref 0.44–1.00)
GFR, Estimated: >60 mL/min (ref >60)
Glucose, Bld: 126 mg/dL — ABNORMAL HIGH (ref 70–99)
Potassium: 4.6 mmol/L (ref 3.5–5.1)
Sodium: 135 mmol/L (ref 135–145)

## 2021-08-10 MED ORDER — CHLORHEXIDINE GLUCONATE CLOTH 2 % EX PADS: 6.0000 | MEDICATED_PAD | Freq: Every day | CUTANEOUS | Status: DC

## 2021-08-10 NOTE — Evaluation (Signed)
Physical Therapy Evaluation Patient Details Name: Katie Weber MRN: [id number] DOB: Jan 23, 1946 Today's Date: 08/10/2021  History of Present Illness  75 yo female with onset of failed conservative measures for R knee was admitted 11/1 for R TKA, now WBAT.  PMHx:  L TKA, OA, SBO, hypothyroidism, leukocytosis, atherosclerosis, HLD, bronchitis,  Clinical Impression  Pt was seen for first visit to assess mobility and get baseline information.  Has a small set of steps at home to navigate and will have family assistance, so will recommend PT see her acutely and PT recommending transfer care to [place].  Pt is completing rehab on R knee after just recently having L knee done, so is more proficient with the sequences of gait and use of RW for support and pain management.  Ice was applied and encouraged her to let staff know when she is in need of help for bed and to get more ice. Follow for goals of acute PT as are outlined in POC.         Recommendations for follow up therapy are one component of a multi-disciplinary discharge planning process, led by the attending physician.  Recommendations may be updated based on patient status, additional functional criteria and insurance authorization.  Follow Up Recommendations Follow physician's recommendations for discharge plan and follow up therapies    Assistance Recommended at Discharge Intermittent Supervision/Assistance  Functional Status Assessment Patient has had a recent decline in their functional status and demonstrates the ability to make significant improvements in function in a reasonable and predictable amount of time.  Equipment Recommendations  None recommended by PT    Recommendations for Other Services       Precautions / Restrictions Precautions Precautions: Fall;Knee Precaution Booklet Issued: No Precaution Comments: reviewed verbally including chair ex Restrictions Weight Bearing Restrictions: Yes RLE Weight Bearing: Weight  bearing as tolerated      Mobility  Bed Mobility Overal bed mobility: Needs Assistance Bed Mobility: Supine to Sit     Supine to sit: Min assist     General bed mobility comments: minor help with RLE and trunk    Transfers Overall transfer level: Needs assistance Equipment used: Rolling walker (2 wheels);1 person hand held assist Transfers: Sit to/from Stand Sit to Stand: Min assist;Min guard           General transfer comment: min assist to power up the first time then min guard to steady on walker    Ambulation/Gait Ambulation/Gait assistance: Min guard Gait Distance (Feet): 42 Feet Assistive device: Rolling walker (2 wheels);1 person hand held assist Gait Pattern/deviations: Step-through pattern;Step-to pattern;Decreased stride length;Wide base of support;Trunk flexed;Decreased weight shift to right Gait velocity: reduced Gait velocity interpretation: <1.31 ft/sec, indicative of household ambulator General Gait Details: pt is on RW with help to sequence and off load RLE, reduction in pain as she practiced the sequence  Stairs            Wheelchair Mobility    Modified Rankin (Stroke Patients Only)       Balance Overall balance assessment: Needs assistance Sitting-balance support: Feet supported;Single extremity supported Sitting balance-Leahy Scale: Good     Standing balance support: Bilateral upper extremity supported;During functional activity Standing balance-Leahy Scale: Poor                               Pertinent Vitals/Pain Pain Assessment: Faces Faces Pain Scale: Hurts little more Breathing: normal Pain Location: R knee Pain Descriptors /  Indicators: Guarding;Operative site guarding Pain Intervention(s): Limited activity within patient's tolerance;Monitored during session;Premedicated before session;Repositioned;Ice applied    Home Living Family/patient expects to be discharged to:: Private residence Living Arrangements:  Spouse/significant other Available Help at Discharge: Family;Available 24 hours/day Type of Home: House Home Access: Stairs to enter Entrance Stairs-Rails: Right;Left;Can reach both Entrance Stairs-Number of Steps: 2 Alternate Level Stairs-Number of Steps: flight Home Layout: Two level;Able to live on main level with bedroom/bathroom Home Equipment: Rolling Walker (2 wheels);Shower seat;Cane - single point;Toilet riser      Prior Function Prior Level of Function : Independent/Modified Independent             Mobility Comments: no AD needed       Hand Dominance   Dominant Hand: Right    Extremity/Trunk Assessment   Upper Extremity Assessment Upper Extremity Assessment: Overall WFL for tasks assessed    Lower Extremity Assessment Lower Extremity Assessment: RLE deficits/detail RLE Deficits / Details: new TKA with stiffness in extension mainly RLE Coordination: decreased gross motor    Cervical / Trunk Assessment Cervical / Trunk Assessment: Kyphotic (mild)  Communication   Communication: No difficulties  Cognition Arousal/Alertness: Awake/alert Behavior During Therapy: WFL for tasks assessed/performed Overall Cognitive Status: Within Functional Limits for tasks assessed                                 General Comments: specific instructions for mobility were familiar to her        General Comments General comments (skin integrity, edema, etc.): pt is now assisted to chair after walk, ice applied to knee with minimal edema and good color with no significant redness or bruising    Exercises Total Joint Exercises Ankle Circles/Pumps: AROM;5 reps Quad Sets: AROM;10 reps Gluteal Sets: AROM;10 reps Goniometric ROM: extension -30, flexion 89   Assessment/Plan    PT Assessment Patient needs continued PT services  PT Problem List Decreased strength;Decreased range of motion;Decreased activity tolerance;Decreased balance;Decreased mobility;Decreased  coordination;Decreased skin integrity;Pain       PT Treatment Interventions DME instruction;Gait training;Stair training;Functional mobility training;Therapeutic activities;Therapeutic exercise;Balance training;Neuromuscular re-education;Patient/family education    PT Goals (Current goals can be found in the Care Plan section)  Acute Rehab PT Goals Patient Stated Goal: to get home and manage her knee pain PT Goal Formulation: With patient Time For Goal Achievement: 08/17/21 Potential to Achieve Goals: Good    Frequency 7X/week   Barriers to discharge Inaccessible home environment;Decreased caregiver support      Co-evaluation               AM-PAC PT "6 Clicks" Mobility  Outcome Measure Help needed turning from your back to your side while in a flat bed without using bedrails?: None Help needed moving from lying on your back to sitting on the side of a flat bed without using bedrails?: A Little Help needed moving to and from a bed to a chair (including a wheelchair)?: A Little Help needed standing up from a chair using your arms (e.g., wheelchair or bedside chair)?: A Little Help needed to walk in hospital room?: A Little Help needed climbing 3-5 steps with a railing? : A Lot 6 Click Score: 18    End of Session Equipment Utilized During Treatment: Gait belt Activity Tolerance: Patient tolerated treatment well;Patient limited by pain Patient left: in chair;with call bell/phone within reach;with chair alarm set Nurse Communication: Mobility status PT Visit Diagnosis: Unsteadiness on  feet (R26.81);Other abnormalities of gait and mobility (R26.89)    Time: 0601-5615 PT Time Calculation (min) (ACUTE ONLY): 34 min   Charges:   PT Evaluation $PT Eval Moderate Complexity: 1 Mod PT Treatments $Gait Training: 8-22 mins       Ramond Dial 08/10/2021, 1:27 PM  Mee Hives, PT PhD Acute Rehab Dept. Number: [place] and [place]

## 2021-08-10 NOTE — Progress Notes (Signed)
Patient asked prior to surgery to be referred to [facility] for her HHPT is needed. Referral made to [place] with [facility].

## 2021-08-10 NOTE — Progress Notes (Signed)
Physical Therapy Treatment Patient Details Name: Katie Weber MRN: [id number] DOB: 1946-03-24 Today's Date: 08/10/2021   History of Present Illness 75 yo female with onset of failed conservative measures for R knee was admitted 11/1 for R TKA, now WBAT.  PMHx:  L TKA, OA, SBO, hypothyroidism, leukocytosis, atherosclerosis, HLD, bronchitis,    PT Comments    Pt was seen for second session today, with effort to try to walk but pt has been up mult times and is tired.  Reviewed all Total joint ex's and pt is requiring help for abd/add and knee flexion on RLE. Her pain is somewhat managed with meds, but reapplied ice pack with new ice after the exercises were done. Pt is motivated to work, pleasant and discussing goals of therapy.  Follow up with her for more mobility next session to walk on the hallway and increase ROM on R knee actively.  Consider need to premedicate for best session tolerance.  Recommendations for follow up therapy are one component of a multi-disciplinary discharge planning process, led by the attending physician.  Recommendations may be updated based on patient status, additional functional criteria and insurance authorization.  Follow Up Recommendations  Follow physician's recommendations for discharge plan and follow up therapies     Assistance Recommended at Discharge Intermittent Supervision/Assistance  Equipment Recommendations  None recommended by PT    Recommendations for Other Services       Precautions / Restrictions Precautions Precautions: Fall;Knee Precaution Booklet Issued: No Precaution Comments: reviewed verbally including chair ex Restrictions Weight Bearing Restrictions: Yes RLE Weight Bearing: Weight bearing as tolerated     Mobility  Bed Mobility Overal bed mobility: Needs Assistance Bed Mobility: Rolling Rolling: Min assist         General bed mobility comments: minor help with RLE and trunk    Transfers Overall transfer  level: Needs assistance                 General transfer comment: remained in bed    Ambulation/Gait             General Gait Details: not able to tolerate, tired from mult trips to BR and in room before PT arrived   Stairs             Wheelchair Mobility    Modified Rankin (Stroke Patients Only)       Balance Overall balance assessment: Needs assistance                                          Cognition Arousal/Alertness: Awake/alert Behavior During Therapy: WFL for tasks assessed/performed Overall Cognitive Status: Within Functional Limits for tasks assessed                                 General Comments: specific instructions for mobility were familiar to her        Exercises Total Joint Exercises Ankle Circles/Pumps: AROM;5 reps Quad Sets: AROM;10 reps Gluteal Sets: AROM;10 reps Heel Slides: AAROM;10 reps;AROM Hip ABduction/ADduction: AROM;AAROM;10 reps Straight Leg Raises: AAROM;AROM;10 reps    General Comments General comments (skin integrity, edema, etc.): reapplied ice to R knee after session      Pertinent Vitals/Pain Pain Assessment: Faces Faces Pain Scale: Hurts little more Breathing: normal Pain Location: R knee Pain Descriptors / Indicators: Guarding;Operative site guarding Pain  Intervention(s): Limited activity within patient's tolerance;Monitored during session;Premedicated before session;Repositioned;Ice applied    Home Living                          Prior Function            PT Goals (current goals can now be found in the care plan section) Acute Rehab PT Goals Patient Stated Goal: to get home and manage her knee pain PT Goal Formulation: With patient Time For Goal Achievement: 08/17/21 Potential to Achieve Goals: Good    Frequency    7X/week      PT Plan      Co-evaluation              AM-PAC PT "6 Clicks" Mobility   Outcome Measure  Help needed  turning from your back to your side while in a flat bed without using bedrails?: None Help needed moving from lying on your back to sitting on the side of a flat bed without using bedrails?: A Little Help needed moving to and from a bed to a chair (including a wheelchair)?: A Little Help needed standing up from a chair using your arms (e.g., wheelchair or bedside chair)?: A Little Help needed to walk in hospital room?: A Little Help needed climbing 3-5 steps with a railing? : A Lot 6 Click Score: 18    End of Session Equipment Utilized During Treatment: Gait belt Activity Tolerance: Patient tolerated treatment well;Patient limited by pain Patient left: in chair;with call bell/phone within reach;with chair alarm set Nurse Communication: Mobility status PT Visit Diagnosis: Unsteadiness on feet (R26.81);Other abnormalities of gait and mobility (R26.89)     Time: 1550-1616 PT Time Calculation (min) (ACUTE ONLY): 26 min  Charges:  $Therapeutic Exercise: 23-37 mins                  Ramond Dial 08/10/2021, 5:34 PM  Mee Hives, PT PhD Acute Rehab Dept. Number: [place] and [place]

## 2021-08-10 NOTE — Plan of Care (Signed)
  Problem: Education: Goal: Knowledge of General Education information will improve Description: Including pain rating scale, medication(s)/side effects and non-pharmacologic comfort measures Outcome: Progressing   Problem: Health Behavior/Discharge Planning: Goal: Ability to manage health-related needs will improve Outcome: Progressing   Problem: Clinical Measurements: Goal: Will remain free from infection Outcome: Progressing   

## 2021-08-10 NOTE — Discharge Instructions (Signed)

## 2021-08-10 NOTE — Progress Notes (Signed)
Patient ID: Katie Weber, female   DOB: August 13, 1946, 75 y.o.   MRN: [id number] Sitting up in chair using incentive spirometry.  Has no complaints.  .  She been up with physical therapy walking in the room.  Anticipate physical therapy this afternoon.Planning on discharge to home tomorrow.

## 2021-08-10 NOTE — TOC Initial Note (Signed)
Transition of Care [facility]) - Initial/Assessment Note    Patient Details  Name: Katie Weber MRN: [id number] Date of Birth: [birth year]  Transition of [facility] CM/SW Contact:    Sharin Mons, RN Phone Number: 08/10/2021, 8:57 AM  Clinical Narrative:                   - s/p RIGHT TOTAL KNEE ARTHROPLASTY , 10/31 From home with spouse. States PTA independent with ADL's.  Pt with W/C, ROLLING WALKER, SHOWER BENCH AND 2 canes @ home. Pt setup with [facility] prior to admit for home health services. Pt agreeable to HHPT services.   PT evaluation pending.....  Pt without DME needs.  TOC team will continue to monitor and assist with needs.  Expected Discharge Plan: [place] Barriers to Discharge: Continued Medical Work up   Patient Goals and CMS Choice Patient states their goals for this hospitalization and ongoing recovery are:: to get better and go home   Choice offered to / list presented to : Patient  Expected Discharge Plan and Services Expected Discharge Plan: [place]   Discharge Planning Services: CM Consult   Living arrangements for the past 2 months: Single Family Home                           HH Arranged: PT [facility] Agency: [place] ([place]) Date HH Agency Contacted: 08/10/21 Time HH Agency Contacted: [phone] Representative spoke with at [place]: [place] Arrangements/Services Living arrangements for the past 2 months: [place] with:: Spouse Patient language and need for interpreter reviewed:: Yes Do you feel safe going back to the place where you live?: Yes      Need for Family Participation in Patient Care: Yes (Comment) Care giver support system in place?: Yes (comment) Current home services: DME (W/C, walker , shower bench and 2 canes) Criminal Activity/Legal Involvement Pertinent to Current Situation/Hospitalization: No - Comment as needed  Activities of  Daily Living Home Assistive Devices/Equipment: Eyeglasses ADL Screening (condition at time of admission) Patient's cognitive ability adequate to safely complete daily activities?: Yes Is the patient deaf or have difficulty hearing?: No Does the patient have difficulty seeing, even when wearing glasses/contacts?: No Does the patient have difficulty concentrating, remembering, or making decisions?: No Patient able to express need for assistance with ADLs?: Yes Does the patient have difficulty dressing or bathing?: No Independently performs ADLs?: Yes (appropriate for developmental age) Does the patient have difficulty walking or climbing stairs?: Yes Weakness of Legs: Right Weakness of Arms/Hands: None  Permission Sought/Granted   Permission granted to share information with : Yes, Verbal Permission Granted              Emotional Assessment Appearance:: Appears stated age Attitude/Demeanor/Rapport: Gracious Affect (typically observed): Accepting Orientation: : Oriented to Self, Oriented to Place, Oriented to  Time, Oriented to Situation Alcohol / Substance Use: Not Applicable Psych Involvement: No (comment)  Admission diagnosis:  DJD (degenerative joint disease) of knee [M17.9] Status post total right knee replacement [Z96.651] Patient Active Problem List   Diagnosis Date Noted   DJD (degenerative joint disease) of knee 08/09/2021   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   Unilateral primary osteoarthritis, right knee 06/06/2021   Status post total left knee replacement 02/15/2021   Unilateral primary osteoarthritis, left knee 01/04/2021   Coronary artery calcification 04/24/2017   Hyperlipidemia 04/24/2017  Acute bronchitis 09/29/2014   SBO (small bowel obstruction) ([place]) 09/27/2014   Nausea and vomiting 09/27/2014   Hypothyroidism 09/27/2014   Leukocytosis 09/27/2014   PCP:  [place], [place]:   [id number] - ,  [place] - [address] [place] [address] Phone: [phone] Fax: [phone]     Social Determinants of Health (SDOH) Interventions    Readmission Risk Interventions No flowsheet data found.

## 2021-08-10 NOTE — Progress Notes (Signed)
Subjective: 1 Day Post-Op Procedure(s) (LRB): RIGHT TOTAL KNEE ARTHROPLASTY (Right) Patient reports pain as moderate.    Objective: Vital signs in last 24 hours: Temp:  [97.6 F (36.4 C)-98.3 F (36.8 C)] 98.2 F (36.8 C) (11/02 0032) Pulse Rate:  [57-86] 86 (11/02 0032) Resp:  [11-18] 16 (11/02 0032) BP: (98-166)/(56-75) 113/58 (11/02 0032) SpO2:  [94 %-97 %] 94 % (11/02 0032) Weight:  [81.2 kg] 81.2 kg (11/01 1223)  Intake/Output from previous day: 11/01 0701 - 11/02 0700 In: 1150 [I.V.:1000; IV Piggyback:150] Out: 300 [Urine:250; Blood:50] Intake/Output this shift: No intake/output data recorded.  Recent Labs    08/10/21 0209  HGB 10.3*   Recent Labs    08/10/21 0209  WBC 13.1*  RBC 3.78*  HCT 33.4*  PLT 252   Recent Labs    08/10/21 0209  NA 135  K 4.6  CL 106  CO2 24  BUN 10  CREATININE 0.95  GLUCOSE 126*  CALCIUM 8.5*   No results for input(s): LABPT, INR in the last 72 hours.  Sensation intact distally Intact pulses distally Dorsiflexion/Plantar flexion intact Incision: dressing C/D/I No cellulitis present Compartment soft   Assessment/Plan: 1 Day Post-Op Procedure(s) (LRB): RIGHT TOTAL KNEE ARTHROPLASTY (Right) Up with therapy      Mcarthur Rossetti 08/10/2021, 7:47 AM

## 2021-08-11 ENCOUNTER — Encounter (HOSPITAL_COMMUNITY): Payer: Self-pay | Admitting: Orthopaedic Surgery

## 2021-08-11 DIAGNOSIS — M1711 Unilateral primary osteoarthritis, right knee: Secondary | ICD-10-CM | POA: Diagnosis not present

## 2021-08-11 MED ORDER — METHOCARBAMOL 500 MG PO TABS
500.0000 mg | ORAL_TABLET | Freq: Four times a day (QID) | ORAL | 0 refills | Status: DC | PRN
Start: 2021-08-11 — End: 2022-01-07

## 2021-08-11 MED ORDER — OXYCODONE HCL 5 MG PO TABS
5.0000 mg | ORAL_TABLET | Freq: Four times a day (QID) | ORAL | 0 refills | Status: DC | PRN
Start: 2021-08-11 — End: 2021-08-23

## 2021-08-11 MED ORDER — ASPIRIN 81 MG PO CHEW
81.0000 mg | CHEWABLE_TABLET | Freq: Two times a day (BID) | ORAL | 0 refills | Status: DC
Start: 2021-08-11 — End: 2021-09-05

## 2021-08-11 NOTE — Discharge Summary (Signed)
Patient ID: Katie Weber MRN: [id number] DOB/AGE: 01-14-1946 75 y.o.  Admit date: 08/09/2021 Discharge date: 08/11/2021  Admission Diagnoses:  Principal Problem:   Unilateral primary osteoarthritis, right knee Active Problems:   DJD (degenerative joint disease) of knee   Status post total right knee replacement   Discharge Diagnoses:  Same  Past Medical History:  Diagnosis Date   Anemia    when pt was younger   Arthritis    arthritis fingers   Asthma    Coronary artery calcification 04/24/2017   Diverticulitis    x2 -last 1'17 tx. outpatient.   Environmental and seasonal allergies    2014 Possibly related to air freshners.   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   Family history of anesthesia complication 98XQJ ago   daughter had an allergic reaction to atropine   GERD (gastroesophageal reflux disease)    no meds   History of hiatal hernia 09/14/2014   seen on xray   History of kidney stones    '80's lithotripsy-[facility] x1   History of kidney stones    x1   Hyperlipidemia    Hypothyroidism    Pneumonia 2014   Pure hypercholesterolemia    Tremor of left hand     Surgeries: Procedure(s): RIGHT TOTAL KNEE ARTHROPLASTY on 08/09/2021   Consultants:   Discharged Condition: Improved  Hospital Course: Shakirra Buehler is an 75 y.o. female who was admitted 08/09/2021 for operative treatment ofUnilateral primary osteoarthritis, right knee. Patient has severe unremitting pain that affects sleep, daily activities, and work/hobbies. After pre-op clearance the patient was taken to the operating room on 08/09/2021 and underwent  Procedure(s): RIGHT TOTAL KNEE ARTHROPLASTY.    Patient was given perioperative antibiotics:  Anti-infectives (From admission, onward)    Start     Dose/Rate Route Frequency Ordered Stop   08/10/21 0600  clindamycin (CLEOCIN) IVPB 900 mg        900 mg 100 mL/hr over 30  Minutes Intravenous On call to O.R. 08/09/21 1218 08/09/21 1445   08/09/21 2100  clindamycin (CLEOCIN) IVPB 600 mg        600 mg 100 mL/hr over 30 Minutes Intravenous Every 6 hours 08/09/21 1635 08/10/21 0450   08/09/21 1228  clindamycin (CLEOCIN) 900 MG/50ML IVPB       Note to Pharmacy: Odelia Gage   : cabinet override      08/09/21 1228 08/09/21 1453        Patient was given sequential compression devices, early ambulation, and chemoprophylaxis to prevent DVT.  Patient benefited maximally from hospital stay and there were no complications.    Recent vital signs: Patient Vitals for the past 24 hrs:  BP Temp Temp src Pulse Resp SpO2  08/11/21 0740 131/70 98.7 F (37.1 C) Oral 90 15 91 %  08/10/21 2137 121/67 98.8 F (37.1 C) Oral 89 16 (!) 89 %  08/10/21 1407 (!) 124/57 99.6 F (37.6 C) Oral 81 18 93 %     Recent laboratory studies:  Recent Labs    08/10/21 0209  WBC 13.1*  HGB 10.3*  HCT 33.4*  PLT 252  NA 135  K 4.6  CL 106  CO2 24  BUN 10  CREATININE 0.95  GLUCOSE 126*  CALCIUM 8.5*     Discharge Medications:   Allergies as of 08/11/2021       Reactions   Contrast Media [iodinated Diagnostic Agents] Shortness Of Breath, Other (  See Comments)   Tightness in chest. Trouble breathing   Penicillins Hives   .Marland KitchenHas patient had a PCN reaction causing immediate rash, facial/tongue/throat swelling, SOB or lightheadedness with hypotension: No Has patient had a PCN reaction causing severe rash involving mucus membranes or skin necrosis: No Has patient had a PCN reaction that required hospitalization No Has patient had a PCN reaction occurring within the last 10 years: No If all of the above answers are "NO", then may proceed with Cephalosporin use.        Medication List     TAKE these medications    alendronate 70 MG tablet Commonly known as: FOSAMAX Take 70 mg by mouth every Tuesday. Take with a full glass of water on an empty stomach.   aspirin 81 MG  chewable tablet Chew 1 tablet (81 mg total) by mouth 2 (two) times daily.   levothyroxine 100 MCG tablet Commonly known as: SYNTHROID Take 100 mcg by mouth daily before breakfast.   methocarbamol 500 MG tablet Commonly known as: ROBAXIN Take 1 tablet (500 mg total) by mouth every 6 (six) hours as needed for muscle spasms.   oxyCODONE 5 MG immediate release tablet Commonly known as: Oxy IR/ROXICODONE Take 1-2 tablets (5-10 mg total) by mouth every 6 (six) hours as needed for moderate pain (pain score 4-6).   rosuvastatin 10 MG tablet Commonly known as: CRESTOR Take 10 mg by mouth 2 (two) times a week.   Salonpas 3.10-14-08 % Ptch Generic drug: Camphor-Menthol-Methyl Sal Apply 1 application topically daily as needed (Pain).   vitamin B-12 1000 MCG tablet Commonly known as: CYANOCOBALAMIN Take 1,000 mcg by mouth daily.   vitamin C 500 MG tablet Commonly known as: ASCORBIC ACID Take 500 mg by mouth daily.   Vitamin D 50 MCG (2000 UT) tablet Take 2,000 Units by mouth 3 (three) times a week.   zinc gluconate 50 MG tablet Take 50 mg by mouth 2 (two) times a week.               Durable Medical Equipment  (From admission, onward)           Start     Ordered   08/09/21 1730  DME 3 n 1  Once        08/09/21 1729   08/09/21 1730  DME Walker rolling  Once       Question Answer Comment  Walker: With 5 Inch Wheels   Patient needs a walker to treat with the following condition Status post total right knee replacement      08/09/21 1729            Diagnostic Studies: DG Knee Right Port  Result Date: 08/09/2021 CLINICAL DATA:  Right knee replacement EXAM: PORTABLE RIGHT KNEE - 1-2 VIEW COMPARISON:  06/06/2021 FINDINGS: Changes of right knee replacement. No hardware or bony complicating feature. Soft tissue and joint space gas. IMPRESSION: Right knee replacement.  No visible complicating feature. Electronically Signed   By: Rolm Baptise M.D.   On: 08/09/2021 18:10     Disposition: Discharge disposition: 01-Home or [facility]     Mcarthur Rossetti, MD Follow up in 2 week(s).   Specialty: Orthopedic Surgery Contact information: [place] [address] [place] Follow up.   Why: home health PT services will be provided by [place], start of  care within 48 hours post discharge                 Signed: Mcarthur Rossetti 08/11/2021, 9:49 AM

## 2021-08-11 NOTE — Progress Notes (Signed)
PT AVS reviewed and pt verbalized understanding of all teaching, IV removed without complications, All pt belongings are in pt possession. Pt leaving with husband as transport and Home health has been arranged.

## 2021-08-11 NOTE — TOC Transition Note (Signed)
Transition of Care [facility]) - CM/SW Discharge Note   Patient Details  Name: Katie Weber MRN: [id number] Date of Birth: April 15, 1946  [facility] CM/SW Contact:  Sharin Mons, RN Phone Number: 08/11/2021, 10:00 AM   Clinical Narrative:    Patient will DC to: home Anticipated DC date: 08/11/2021 Family notified: yes Transport by: car         - s/p RIGHT TOTAL KNEE ARTHROPLASTY , 10/31 Per MD patient ready for DC today. RN, patient, patient's family, and [facility] notified of DC. Pt without DME needs or Rx med concerns. Post hospital follow up appointment noted on AVS. Husband to provide transportation to home.  RNCM will sign off for now as intervention is no longer needed. Please consult [place] again if new needs arise.    Final next level of care: Home w Home Health Services Barriers to Discharge: No Barriers Identified   Patient Goals and CMS Choice Patient states their goals for this hospitalization and ongoing recovery are:: to get better and go home   Choice offered to / list presented to : Patient  Discharge Placement                       Discharge Plan and Services   Discharge Planning Services: CM Consult                      HH Arranged: PT [facility] Agency: [place]) Date [facility] Agency Contacted: 08/10/21 Time [place]: [phone] Representative spoke with at [place]: [place]) Interventions     Readmission Risk Interventions No flowsheet data found.

## 2021-08-11 NOTE — Progress Notes (Signed)
  Mobility Specialist Criteria Algorithm Info.  Mobility Team: HOB elevated: Activity: Ambulated in hall Range of motion: Active; All extremities Level of assistance: Standby assist, set-up cues, supervision of patient - no hands on Assistive device: Front wheel walker Minutes sitting in chair:  Minutes stood:  Minutes ambulated:  Distance ambulated (ft): 290 ft Mobility response: Tolerated well Bed Position: Semi-fowlers  Patient received lying supine in bed. Prior to ambulation complained of 2/10 pain. Ambulated in hallway supervision level with slow steady gait. Required minimal assistance to get RLE back into bed. Tolerated ambulation well without complaint or incident and was left with all needs met.   08/11/2021 11:05 AM

## 2021-08-11 NOTE — Progress Notes (Signed)
Subjective: 2 Days Post-Op Procedure(s) (LRB): RIGHT TOTAL KNEE ARTHROPLASTY (Right) Patient reports pain as moderate.    Objective: Vital signs in last 24 hours: Temp:  [98.7 F (37.1 C)-99.6 F (37.6 C)] 98.7 F (37.1 C) (11/03 0740) Pulse Rate:  [81-90] 90 (11/03 0740) Resp:  [15-18] 15 (11/03 0740) BP: (121-131)/(57-70) 131/70 (11/03 0740) SpO2:  [89 %-93 %] 91 % (11/03 0740)  Intake/Output from previous day: 11/02 0701 - 11/03 0700 In: 1505.4 [P.O.:360; I.V.:1145.4] Out: -  Intake/Output this shift: No intake/output data recorded.  Recent Labs    08/10/21 0209  HGB 10.3*   Recent Labs    08/10/21 0209  WBC 13.1*  RBC 3.78*  HCT 33.4*  PLT 252   Recent Labs    08/10/21 0209  NA 135  K 4.6  CL 106  CO2 24  BUN 10  CREATININE 0.95  GLUCOSE 126*  CALCIUM 8.5*   No results for input(s): LABPT, INR in the last 72 hours.  Sensation intact distally Intact pulses distally Dorsiflexion/Plantar flexion intact Incision: dressing C/D/I No cellulitis present Compartment soft   Assessment/Plan: 2 Days Post-Op Procedure(s) (LRB): RIGHT TOTAL KNEE ARTHROPLASTY (Right) Up with therapy Discharge home with home health      Mcarthur Rossetti 08/11/2021, 9:47 AM

## 2021-08-11 NOTE — Progress Notes (Signed)
Physical Therapy Treatment Patient Details Name: Katie Weber MRN: [id number] DOB: 1945-10-19 Today's Date: 08/11/2021   History of Present Illness 75 yo female with onset of failed conservative measures for R knee was admitted 11/1 for R TKA, now WBAT.  PMHx:  L TKA, OA, SBO, hypothyroidism, leukocytosis, atherosclerosis, HLD, bronchitis,    PT Comments    Pt was seen today to review safety with gait on RW, and then to reinstruct stairs with TKA.  Pt has previously been instructed and demonstrates a good recall of the prior training.  Pt had a review of bed ex and understands HHPT will follow up with a more complete and intensive routine at home.  Pt is safe for short walks at home along with being accompanied on the stairs to enter the house.  Very motivated and again has had previous instruction that is helping to make her safety better.  Follow along as her stay permits for goals of acute PT.   Recommendations for follow up therapy are one component of a multi-disciplinary discharge planning process, led by the attending physician.  Recommendations may be updated based on patient status, additional functional criteria and insurance authorization.  Follow Up Recommendations  Follow physician's recommendations for discharge plan and follow up therapies     Assistance Recommended at Discharge Intermittent Supervision/Assistance  Equipment Recommendations  None recommended by PT    Recommendations for Other Services       Precautions / Restrictions Precautions Precautions: Fall;Knee Precaution Booklet Issued: No Precaution Comments: reviewed verbally including chair ex Restrictions Weight Bearing Restrictions: Yes RLE Weight Bearing: Weight bearing as tolerated     Mobility  Bed Mobility Overal bed mobility: Needs Assistance Bed Mobility: Supine to Sit     Supine to sit: Min guard;Min assist     General bed mobility comments: minimal RLE help but otherwise can  get to side of bed    Transfers Overall transfer level: Needs assistance Equipment used: Rolling walker (2 wheels);1 person hand held assist Transfers: Sit to/from Stand Sit to Stand: Min guard           General transfer comment: pt is using correct hand placement to stand    Ambulation/Gait Ambulation/Gait assistance: Min guard Gait Distance (Feet): 120 Feet Assistive device: Rolling walker (2 wheels);1 person hand held assist Gait Pattern/deviations: Step-through pattern;Decreased weight shift to right;Narrow base of support;Decreased stride length Gait velocity: reduced minimally Gait velocity interpretation: <1.31 ft/sec, indicative of household ambulator General Gait Details: minor instability that RW manages well   Stairs Stairs: Yes Stairs assistance: Min guard Stair Management: Two rails;Forwards;Step to pattern Number of Stairs: 10 General stair comments: reviewed stairs for control of knee, no balance issues and follows sequence well   Wheelchair Mobility    Modified Rankin (Stroke Patients Only)       Balance Overall balance assessment: Needs assistance Sitting-balance support: Feet supported Sitting balance-Leahy Scale: Good     Standing balance support: Bilateral upper extremity supported;During functional activity Standing balance-Leahy Scale: Fair Standing balance comment: less than fair dynamically                            Cognition Arousal/Alertness: Awake/alert Behavior During Therapy: WFL for tasks assessed/performed Overall Cognitive Status: Within Functional Limits for tasks assessed  Exercises Total Joint Exercises Ankle Circles/Pumps: AROM;5 reps Quad Sets: AROM;10 reps Gluteal Sets: AROM;10 reps    General Comments General comments (skin integrity, edema, etc.): pt was seen for mobility on RW and on stairs with cues and rails, good return on demonstration for  both      Pertinent Vitals/Pain Pain Assessment: Faces Faces Pain Scale: Hurts little more Breathing: normal Pain Location: R knee Pain Descriptors / Indicators: Guarding Pain Intervention(s): Monitored during session;Premedicated before session;Repositioned;Ice applied    Home Living                          Prior Function            PT Goals (current goals can now be found in the care plan section) Acute Rehab PT Goals Patient Stated Goal: to get home and manage her knee pain Progress towards PT goals: Progressing toward goals    Frequency    7X/week      PT Plan Current plan remains appropriate    Co-evaluation              AM-PAC PT "6 Clicks" Mobility   Outcome Measure  Help needed turning from your back to your side while in a flat bed without using bedrails?: None Help needed moving from lying on your back to sitting on the side of a flat bed without using bedrails?: A Little Help needed moving to and from a bed to a chair (including a wheelchair)?: A Little Help needed standing up from a chair using your arms (e.g., wheelchair or bedside chair)?: A Little Help needed to walk in hospital room?: A Little Help needed climbing 3-5 steps with a railing? : A Little 6 Click Score: 19    End of Session Equipment Utilized During Treatment: Gait belt Activity Tolerance: Patient tolerated treatment well;Patient limited by pain Patient left: in chair;with call bell/phone within reach;with chair alarm set Nurse Communication: Mobility status PT Visit Diagnosis: Unsteadiness on feet (R26.81);Other abnormalities of gait and mobility (R26.89)     Time: 1030-1058 PT Time Calculation (min) (ACUTE ONLY): 28 min  Charges:  $Gait Training: 8-22 mins $Therapeutic Activity: 8-22 mins                  Ramond Dial 08/11/2021, 1:56 PM  Mee Hives, PT PhD Acute Rehab Dept. Number: [place] and [place]

## 2021-08-23 ENCOUNTER — Other Ambulatory Visit: Payer: Self-pay

## 2021-08-23 ENCOUNTER — Encounter: Payer: Self-pay | Admitting: Orthopaedic Surgery

## 2021-08-23 ENCOUNTER — Ambulatory Visit (INDEPENDENT_AMBULATORY_CARE_PROVIDER_SITE_OTHER): Payer: Medicare Other | Admitting: Orthopaedic Surgery

## 2021-08-23 DIAGNOSIS — Z96651 Presence of right artificial knee joint: Secondary | ICD-10-CM

## 2021-08-23 MED ORDER — OXYCODONE HCL 5 MG PO TABS: 5.0000 mg | ORAL_TABLET | Freq: Four times a day (QID) | ORAL | 0 refills | Status: DC | PRN

## 2021-08-23 NOTE — Progress Notes (Signed)
HPI: Katie Weber returns today status post right total knee arthroplasty 08/09/2021.  She is overall doing well.  She states she is little frustrated with flexion she has not been doing as far she did at this point with her left knee.  She is ambulating with a cane.  She has had no fevers chills shortness of breath.  She is asking for refill on oxycodone.  She aspirin twice daily for DVT prophylaxis.  She was on an aspirin prior to surgery taking it occasionally when weekly.  Review of systems see HPI otherwise negative  Physical exam: General well-developed well-nourished female no acute distress mood and affect appropriate. Right knee: Surgical incisions healing well no signs of infection or wound dehiscence.  Staples removed Steri-Strips applied.  She has full extension flexion to 90 degrees.  Calf supple nontender.  Impression: Status post right total knee arthroplasty 08/09/2021  Plan: She will go back on 81 mg aspirin daily for at least 1 week and then resume her schedule of taking aspirin prior to surgery.  She will work on scar tissue mobilization.  We will set her up for outpatient physical therapy here at our office to work on range of motion strengthening gait home exercise program.  Follow-up with [place] in 1 month sooner if there is any questions concerns.  Oxycodone was refilled today.

## 2021-08-23 NOTE — Addendum Note (Signed)
Addended by: Robyne Peers on: 08/23/2021 10:52 AM   Modules accepted: Orders

## 2021-08-29 ENCOUNTER — Other Ambulatory Visit: Payer: Self-pay

## 2021-08-29 ENCOUNTER — Encounter: Payer: Self-pay | Admitting: Physical Therapy

## 2021-08-29 ENCOUNTER — Ambulatory Visit (INDEPENDENT_AMBULATORY_CARE_PROVIDER_SITE_OTHER): Payer: Medicare Other | Admitting: Physical Therapy

## 2021-08-29 DIAGNOSIS — R2681 Unsteadiness on feet: Secondary | ICD-10-CM

## 2021-08-29 DIAGNOSIS — R600 Localized edema: Secondary | ICD-10-CM

## 2021-08-29 DIAGNOSIS — M25561 Pain in right knee: Secondary | ICD-10-CM | POA: Diagnosis not present

## 2021-08-29 DIAGNOSIS — R2689 Other abnormalities of gait and mobility: Secondary | ICD-10-CM

## 2021-08-29 DIAGNOSIS — M6281 Muscle weakness (generalized): Secondary | ICD-10-CM

## 2021-08-29 DIAGNOSIS — M25661 Stiffness of right knee, not elsewhere classified: Secondary | ICD-10-CM | POA: Diagnosis not present

## 2021-08-29 NOTE — Therapy (Signed)
[facility] [address] [place], [place], [address] Phone: [phone]   Fax:  [phone]  Physical Therapy Evaluation  Patient Details  Name: Katie Weber MRN: [id number] Date of Birth: Feb 05, 1946 Referring Provider (PT): Jean Rosenthal, MD   Encounter Date: 08/29/2021   PT End of Session - 08/29/21 0925     Visit Number 1    Number of Visits 18    Date for PT Re-Evaluation 10/21/21    Authorization Type Med A/B and generic comm secondary    Progress Note Due on Visit 10    PT Start Time 0840    PT Stop Time 0929    PT Time Calculation (min) 49 min    Activity Tolerance Patient tolerated treatment well;Patient limited by pain    Behavior During Therapy [facility] for tasks assessed/performed             Past Medical History:  Diagnosis Date   Anemia    when pt was younger   Arthritis    arthritis fingers   Asthma    Coronary artery calcification 04/24/2017   Diverticulitis    x2 -last 1'17 tx. outpatient.   Environmental and seasonal allergies    2014 Possibly related to air freshners.   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   Family history of anesthesia complication 25ZDG ago   daughter had an allergic reaction to atropine   GERD (gastroesophageal reflux disease)    no meds   History of hiatal hernia 09/14/2014   seen on xray   History of kidney stones    '80's lithotripsy-[facility] x1   History of kidney stones    x1   Hyperlipidemia    Hypothyroidism    Pneumonia 2014   Pure hypercholesterolemia    Tremor of left hand     Past Surgical History:  Procedure Laterality Date   BALLOON DILATION N/A 05/04/2014   Procedure: BALLOON DILATION;  Surgeon: Garlan Fair, MD;  Location: [facility];  Service: Endoscopy;  Laterality: N/A;   BREAST LUMPECTOMY Right 1972   benign    BREAST SURGERY Right    '72 -benign tumor   childbirth- NVD x2       COLONOSCOPY WITH PROPOFOL N/A 05/04/2014   Procedure: COLONOSCOPY WITH PROPOFOL;  Surgeon: Garlan Fair, MD;  Location: [facility];  Service: Endoscopy;  Laterality: N/A;   DILATION AND CURETTAGE OF UTERUS     04-20-14 [facility] hospital endometrial polyp removed.   ESOPHAGOGASTRODUODENOSCOPY (EGD) WITH PROPOFOL N/A 05/04/2014   Procedure: ESOPHAGOGASTRODUODENOSCOPY (EGD) WITH PROPOFOL;  Surgeon: Garlan Fair, MD;  [facility];  Service: Endoscopy;  Laterality: N/A;. Procedure done in [place], [place]- 5 yrs ago.   HYSTEROSCOPY WITH D & C N/A 04/20/2014   Procedure: DILATATION AND CURETTAGE /HYSTEROSCOPY;  Surgeon: [name], MD;  Location: [facility];  Service: Gynecology;  Laterality: N/A;   LITHOTRIPSY     POLYPECTOMY  2020   Dr. Lear Ng   TOTAL KNEE ARTHROPLASTY Left 02/15/2021   Procedure: LEFT TOTAL KNEE ARTHROPLASTY;  Surgeon: Mcarthur Rossetti, MD;  Location: [facility];  Service: Orthopedics;  Laterality: Left;   TOTAL KNEE ARTHROPLASTY Right 08/09/2021   Procedure: RIGHT TOTAL KNEE ARTHROPLASTY;  Surgeon: Mcarthur Rossetti, MD;  Location: [facility];  Service: Orthopedics;  Laterality: Right;  Needs RNFA   TUBAL LIGATION      There were no vitals filed for  this visit.    Subjective Assessment - 08/29/21 0842     Subjective This 75yo female was referred to PT by Jean Rosenthal, MD with [phone] (ICD-10-CM) - Status post total right knee replacement performed on 08/09/2021. She feels that this knee is progressing slower than the first TKA.    Pertinent History arthritis, Left TKA 02/15/21, HLD, asthma    Patient Stated Goals to walk trials and active in community    Currently in Pain? Yes    Pain Score 3    in last week, lowest 0/10 & highest 9/10   Pain Location Knee    Pain Orientation Right;Anterior;Medial    Pain Descriptors / Indicators Aching;Stabbing    Pain Type Surgical pain    Pain Onset 1 to 4 weeks ago    Pain Frequency Intermittent     Aggravating Factors  flexing knee, initial change in position    Pain Relieving Factors meds, ice, elevation    Effect of Pain on Daily Activities sleep, time standing & walking                [facility] PT Assessment - 08/29/21 0838       Assessment   Medical Diagnosis Z96.651 (ICD-10-CM) - Status post total right knee replacement    Referring Provider (PT) Jean Rosenthal, MD    Onset Date/Surgical Date 08/09/21    Hand Dominance Right    Prior Therapy left TKA      Precautions   Precautions Fall      Restrictions   Weight Bearing Restrictions No      Balance Screen   Has the patient fallen in the past 6 months No    Has the patient had a decrease in activity level because of a fear of falling?  No    Is the patient reluctant to leave their home because of a fear of falling?  No      Home Environment   Living Environment Private residence    Living Arrangements Spouse/significant other    Type of [place] to enter    Entrance Stairs-Number of Steps 2   threshold steps   Entrance Stairs-Rails Right;Left;Can reach both    Home Layout Two level;1/2 bath on main level    Alternate Level Stairs-Number of Steps 13    Alternate Level Stairs-Rails Left    Home Equipment Walker - 2 wheels;Cane - single point;Tub bench;Hand held shower head      Prior Function   Level of Independence Independent;Independent with household mobility without device;Independent with community mobility without device    Leisure hiking      Observation/Other Assessments   Focus on Therapeutic Outcomes (FOTO)  58% functional with target 65%      Observation/Other Assessments-Edema    Edema Circumferential      Circumferential Edema   Circumferential - Right above knee 49cm, around knee 49.3cm, below knee 41.3cm    Circumferential - Left  above knee 47.3cm, around knee 44.7cm,  below knee 37.3cm      Functional Tests   Functional tests Single leg stance       Single Leg Stance   Comments right 5sec left 9sec but shaky & knee flexed.      ROM / Strength   AROM / PROM / Strength AROM;PROM;Strength      AROM   AROM Assessment Site Knee    Right/Left Knee Right;Left    Right Knee Extension -21  seated LAQ   Right Knee Flexion 93   seated heel slide   Left Knee Extension -4   seated LAQ   Left Knee Flexion 127   seated heel slide     PROM   PROM Assessment Site Knee    Right/Left Knee Right;Left    Right Knee Extension -12   supine   Right Knee Flexion 93   supine     Strength   Strength Assessment Site Knee    Right/Left Knee Right;Left    Right Knee Flexion 3-/5    Right Knee Extension 3-/5      Transfers   Transfers Sit to Stand;Stand to Sit    Sit to Stand 6: Modified independent (Device/Increase time);With upper extremity assist;With armrests;From chair/3-in-1    Stand to Sit 6: Modified independent (Device/Increase time);With upper extremity assist;With armrests;To chair/3-in-1      Ambulation/Gait   Ambulation/Gait Yes    Ambulation/Gait Assistance 5: Supervision    Ambulation Distance (Feet) 100 Feet    Assistive device Straight cane    Gait Pattern Step-through pattern;Decreased arm swing - right;Decreased step length - left;Decreased stance time - right;Decreased hip/knee flexion - right;Decreased weight shift to right;Right hip hike;Right flexed knee in stance;Left flexed knee in stance;Antalgic;Trunk flexed    Ambulation Surface Level;Indoor                        Objective measurements completed on examination: See above findings.       [facility] Adult PT Treatment/Exercise - 08/29/21 0838       Exercises   Exercises Knee/Hip;Other Exercises    Other Exercises  HEP 1 set ea Medbridge Access Code: [facility]                     PT Education - 08/29/21 0912     Education Details Access Code: [email or web address]    Person(s) Educated Patient    Methods Explanation;Demonstration;Tactile cues;Verbal  cues;Handout    Comprehension Verbalized understanding;Returned demonstration;Verbal cues required;Tactile cues required;Need further instruction              PT Short Term Goals - 08/29/21 0943       PT SHORT TERM GOAL #1   Title Pt will be independent in her initial HEP.    Time 4    Period Weeks    Status New    Target Date 09/23/21      PT SHORT TERM GOAL #2   Title Patient reports 50% improvement in sleep    Time 4    Period Weeks    Status New    Target Date 09/23/21      PT SHORT TERM GOAL #3   Title right knee PROM -5* ext to 100* flexion    Time 4    Period Weeks    Status New    Target Date 09/23/21      PT SHORT TERM GOAL #4   Title interum FOTO score improvement >62%    Time 4    Period Weeks    Status New    Target Date 09/23/21               PT Long Term Goals - 08/29/21 0940       PT LONG TERM GOAL #1   Title Pt will be independent in her advanced HEP.    Time 8    Period Weeks    Status New    Target  Date 10/21/21      PT LONG TERM GOAL #2   Title pt reports no pain in knee with standing, gait & functional activities    Time 8    Period Weeks    Status New    Target Date 10/21/21      PT LONG TERM GOAL #3   Title Pt will improve her FOTO to >/= 65    Time 8    Period Weeks    Status New    Target Date 10/21/21      PT LONG TERM GOAL #4   Title Pt will improve her Single Leg Stance to >/= 15 seconds on right LE.    Time 8    Period Weeks    Status New    Target Date 10/21/21      PT LONG TERM GOAL #5   Title Pt will be able to ambulate 500', negotiate ramp & curb and climb a flight of stairs with step over step pattern with no UE support    Time 8    Period Weeks    Status New    Target Date 10/21/21      Additional Long Term Goals   Additional Long Term Goals Yes      PT LONG TERM GOAL #6   Title right knee AROM -3* ext to 100* antigravity    Time 8    Period Weeks    Status New    Target Date 10/21/21                     Plan - 08/29/21 0936     Clinical Impression Statement Pt is a 75y/o female who presents to [facility] s/p Rt TKA on 08/09/21.  She demonstrates decreased strength, ROM, increased edema and pain with gait abnormalities affecting functional mobility including gait & balance.  Pt will benefit from PT to address deficits listed.    Personal Factors and Comorbidities Comorbidity 2    Comorbidities arthritis, Left TKA 02/15/21, HLD, asthma    Examination-Activity Limitations Carry;Locomotion Level;Sleep;Squat;Stairs;Stand;Transfers    Examination-Participation Restrictions Community Activity    Stability/Clinical Decision Making Stable/Uncomplicated    Clinical Decision Making Low    Rehab Potential Good    PT Frequency 3x / week   3x/wk for 3 weeks, then 2x/wk for 5 weeks   PT Duration 8 weeks    PT Treatment/Interventions ADLs/Self Care Home Management;Cryotherapy;Moist Heat;DME Instruction;Gait training;Stair training;Functional mobility training;Therapeutic activities;Therapeutic exercise;Balance training;Neuromuscular re-education;Patient/family education;Manual techniques;Scar mobilization;Passive range of motion;Taping;Vasopneumatic Device;Joint Manipulations    PT Next Visit Plan check & update HEP, manual & exercises to increase knee range    PT Home Exercise Plan Access Code: [place] and Agree with Plan of Care Patient             Patient will benefit from skilled therapeutic intervention in order to improve the following deficits and impairments:  Abnormal gait, Decreased activity tolerance, Decreased balance, Decreased endurance, Decreased mobility, Decreased range of motion, Decreased scar mobility, Decreased strength, Difficulty walking, Increased edema, Impaired flexibility, Obesity, Pain, Postural dysfunction  Visit Diagnosis: Acute pain of right knee  Stiffness of right knee, not elsewhere classified  Muscle weakness (generalized)  Other  abnormalities of gait and mobility  Localized edema  Unsteadiness on feet     Problem List Patient Active Problem List   Diagnosis Date Noted   DJD (degenerative joint disease) of knee 08/09/2021   Status post total right knee  replacement 08/09/2021   Unilateral primary osteoarthritis, right knee 06/06/2021   Status post total left knee replacement 02/15/2021   Unilateral primary osteoarthritis, left knee 01/04/2021   Coronary artery calcification 04/24/2017   Hyperlipidemia 04/24/2017   Acute bronchitis 09/29/2014   SBO (small bowel obstruction) ([place]) 09/27/2014   Nausea and vomiting 09/27/2014   Hypothyroidism 09/27/2014   Leukocytosis 09/27/2014    Jamey Reas, PT, DPT 08/29/2021, 9:45 AM  [facility] Physical Therapy [address] [place], [place], [address] Phone: [phone]   Fax:  [phone]  Name: Katie Weber MRN: [id number] Date of Birth: 1946/08/27

## 2021-08-29 NOTE — Patient Instructions (Signed)
Access Code: [email or web address] URL: [email or web address] Date: 08/29/2021 Prepared by: Jamey Reas  Exercises Quad Setting and Stretching - 2-4 x daily - 7 x weekly - 5-10 sets - 10 reps - prop 5-10 minutes & quad set5 seconds hold Supine Leg Press - 2-4 x daily - 7 x weekly - 3 sets - 10 reps - 5 seconds hold Ankle Alphabet in Elevation - 2-4 x daily - 7 x weekly - 1 sets - 1 reps Supine Heel Slide with Strap - 2-3 x daily - 7 x weekly - 2-3 sets - 10 reps - 5 seconds hold Supine Straight Leg Raises - 2-3 x daily - 7 x weekly - 2-3 sets - 10 reps - 5 seconds hold Seated Knee Flexion Extension AROM - 2-4 x daily - 7 x weekly - 2-3 sets - 10 reps - 5 seconds hold Seated Hamstring Stretch with Strap - 2-4 x daily - 7 x weekly - 1 sets - 3 reps - 20-30 seconds hold

## 2021-08-30 ENCOUNTER — Ambulatory Visit (INDEPENDENT_AMBULATORY_CARE_PROVIDER_SITE_OTHER): Payer: Medicare Other | Admitting: Rehabilitative and Restorative Service Providers"

## 2021-08-30 ENCOUNTER — Encounter: Payer: Self-pay | Admitting: Rehabilitative and Restorative Service Providers"

## 2021-08-30 DIAGNOSIS — R600 Localized edema: Secondary | ICD-10-CM

## 2021-08-30 DIAGNOSIS — R2689 Other abnormalities of gait and mobility: Secondary | ICD-10-CM

## 2021-08-30 DIAGNOSIS — M6281 Muscle weakness (generalized): Secondary | ICD-10-CM | POA: Diagnosis not present

## 2021-08-30 DIAGNOSIS — M25561 Pain in right knee: Secondary | ICD-10-CM

## 2021-08-30 DIAGNOSIS — R2681 Unsteadiness on feet: Secondary | ICD-10-CM

## 2021-08-30 DIAGNOSIS — M25661 Stiffness of right knee, not elsewhere classified: Secondary | ICD-10-CM | POA: Diagnosis not present

## 2021-08-30 NOTE — Therapy (Signed)
[facility] Physical Therapy [address] [place], [place], [address] Phone: [phone]   Fax:  [phone]  Physical Therapy Treatment  Patient Details  Name: Bassy Fetterly MRN: [id number] Date of Birth: 27-Jun-1946 Referring Provider (PT): Jean Rosenthal, MD   Encounter Date: 08/30/2021   PT End of Session - 08/30/21 1155     Visit Number 2    Number of Visits 18    Date for PT Re-Evaluation 10/21/21    Authorization Type Med A/B and generic comm secondary    Progress Note Due on Visit 10    PT Start Time 1144    PT Stop Time 1224    PT Time Calculation (min) 40 min    Activity Tolerance Patient tolerated treatment well    Behavior During Therapy [facility] for tasks assessed/performed             Past Medical History:  Diagnosis Date   Anemia    when pt was younger   Arthritis    arthritis fingers   Asthma    Coronary artery calcification 04/24/2017   Diverticulitis    x2 -last 1'17 tx. outpatient.   Environmental and seasonal allergies    2014 Possibly related to air freshners.   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   Family history of anesthesia complication 16PVV ago   daughter had an allergic reaction to atropine   GERD (gastroesophageal reflux disease)    no meds   History of hiatal hernia 09/14/2014   seen on xray   History of kidney stones    '80's lithotripsy-[facility] x1   History of kidney stones    x1   Hyperlipidemia    Hypothyroidism    Pneumonia 2014   Pure hypercholesterolemia    Tremor of left hand     Past Surgical History:  Procedure Laterality Date   BALLOON DILATION N/A 05/04/2014   Procedure: BALLOON DILATION;  Surgeon: Garlan Fair, MD;  [facility]: [facility];  Service: Endoscopy;  Laterality: N/A;   BREAST LUMPECTOMY Right 1972   benign    BREAST SURGERY Right    '72 -benign tumor   childbirth- NVD x2      COLONOSCOPY WITH  PROPOFOL N/A 05/04/2014   Procedure: COLONOSCOPY WITH PROPOFOL;  Surgeon: Garlan Fair, MD;  [facility];  Service: Endoscopy;  Laterality: N/A;   DILATION AND CURETTAGE OF UTERUS     04-20-14 [facility] hospital endometrial polyp removed.   ESOPHAGOGASTRODUODENOSCOPY (EGD) WITH PROPOFOL N/A 05/04/2014   Procedure: ESOPHAGOGASTRODUODENOSCOPY (EGD) WITH PROPOFOL;  Surgeon: Garlan Fair, MD;  [facility];  Service: Endoscopy;  Laterality: N/A;. Procedure done in [place], [place]- 5 yrs ago.   HYSTEROSCOPY WITH D & C N/A 04/20/2014   Procedure: DILATATION AND CURETTAGE /HYSTEROSCOPY;  Surgeon: [name], MD;  Location: [facility];  Service: Gynecology;  Laterality: N/A;   LITHOTRIPSY     POLYPECTOMY  2020   Dr. Lear Ng   TOTAL KNEE ARTHROPLASTY Left 02/15/2021   Procedure: LEFT TOTAL KNEE ARTHROPLASTY;  Surgeon: Mcarthur Rossetti, MD;  Location: [facility];  Service: Orthopedics;  Laterality: Left;   TOTAL KNEE ARTHROPLASTY Right 08/09/2021   Procedure: RIGHT TOTAL KNEE ARTHROPLASTY;  Surgeon: Mcarthur Rossetti, MD;  Location: [facility];  Service: Orthopedics;  Laterality: Right;  Needs RNFA   TUBAL LIGATION      There were no vitals filed for this visit.  Subjective Assessment - 08/30/21 1151     Subjective Pt. indicated feeling stiffer on Rt knee overall.  Pt. indicated she took pain medicine yesterday that helped her sleep better.    Pertinent History arthritis, Left TKA 02/15/21, HLD, asthma    Patient Stated Goals to walk trials and active in community    Currently in Pain? Yes    Pain Score 2     Pain Location Knee    Pain Orientation Right    Pain Descriptors / Indicators Aching    Pain Type Surgical pain    Pain Onset 1 to 4 weeks ago    Pain Frequency Intermittent    Aggravating Factors  end ranges, walking at times    Pain Relieving Factors medicine helped.                [facility] PT Assessment - 08/30/21 0001       AROM   Right  Knee Flexion 101   in supine heel slide                          [facility] Adult PT Treatment/Exercise - 08/30/21 0001       Knee/Hip Exercises: Stretches   Gastroc Stretch 30 seconds;5 reps;Both   incline board   Other Knee/Hip Stretches supine heel prop static stretching 3 mins      Knee/Hip Exercises: Aerobic   Nustep Lvl 5 10 mins UE/LE      Knee/Hip Exercises: Seated   Other Seated Knee/Hip Exercises alternating 5 sec ext/flexion isometrics 2 mins total    Other Seated Knee/Hip Exercises Seated LAG for motion Rt x 10 after heel prop      Knee/Hip Exercises: Supine   Heel Slides AROM;Right;10 reps   5 sec hold     Manual Therapy   Manual therapy comments seated Rt knee flexion c mobilization c movement IR/distraction (opposite leg movement opposite)                       PT Short Term Goals - 08/29/21 0943       PT SHORT TERM GOAL #1   Title Pt will be independent in her initial HEP.    Time 4    Period Weeks    Status New    Target Date 09/23/21      PT SHORT TERM GOAL #2   Title Patient reports 50% improvement in sleep    Time 4    Period Weeks    Status New    Target Date 09/23/21      PT SHORT TERM GOAL #3   Title right knee PROM -5* ext to 100* flexion    Time 4    Period Weeks    Status New    Target Date 09/23/21      PT SHORT TERM GOAL #4   Title interum FOTO score improvement >62%    Time 4    Period Weeks    Status New    Target Date 09/23/21               PT Long Term Goals - 08/29/21 0940       PT LONG TERM GOAL #1   Title Pt will be independent in her advanced HEP.    Time 8    Period Weeks    Status New    Target Date 10/21/21      PT LONG TERM GOAL #  2   Title pt reports no pain in knee with standing, gait & functional activities    Time 8    Period Weeks    Status New    Target Date 10/21/21      PT LONG TERM GOAL #3   Title Pt will improve her FOTO to >/= 65    Time 8    Period Weeks     Status New    Target Date 10/21/21      PT LONG TERM GOAL #4   Title Pt will improve her Single Leg Stance to >/= 15 seconds on right LE.    Time 8    Period Weeks    Status New    Target Date 10/21/21      PT LONG TERM GOAL #5   Title Pt will be able to ambulate 500', negotiate ramp & curb and climb a flight of stairs with step over step pattern with no UE support    Time 8    Period Weeks    Status New    Target Date 10/21/21      Additional Long Term Goals   Additional Long Term Goals Yes      PT LONG TERM GOAL #6   Title right knee AROM -3* ext to 100* antigravity    Time 8    Period Weeks    Status New    Target Date 10/21/21                   Plan - 08/30/21 1212     Clinical Impression Statement Good quality on isometric holds in mid range.  Able to perform independent ambulation in clinic today (arrived c [facility]).  Pt. to continue to benefit from skilled PT services to progress strength and mobility and balance for functional improvements.    Personal Factors and Comorbidities Comorbidity 2    Comorbidities arthritis, Left TKA 02/15/21, HLD, asthma    Examination-Activity Limitations Carry;Locomotion Level;Sleep;Squat;Stairs;Stand;Transfers    Examination-Participation Restrictions Community Activity    Stability/Clinical Decision Making Stable/Uncomplicated    Rehab Potential Good    PT Frequency 3x / week   3x/wk for 3 weeks, then 2x/wk for 5 weeks   PT Duration 8 weeks    PT Treatment/Interventions ADLs/Self Care Home Management;Cryotherapy;Moist Heat;DME Instruction;Gait training;Stair training;Functional mobility training;Therapeutic activities;Therapeutic exercise;Balance training;Neuromuscular re-education;Patient/family education;Manual techniques;Scar mobilization;Passive range of motion;Taping;Vasopneumatic Device;Joint Manipulations    PT Next Visit Plan Leg press as tolerated, progression of strengthening, static balance intervention.    PT Home  Exercise Plan Access Code: [email or web address]    Consulted and Agree with Plan of Care Patient             Patient will benefit from skilled therapeutic intervention in order to improve the following deficits and impairments:  Abnormal gait, Decreased activity tolerance, Decreased balance, Decreased endurance, Decreased mobility, Decreased range of motion, Decreased scar mobility, Decreased strength, Difficulty walking, Increased edema, Impaired flexibility, Obesity, Pain, Postural dysfunction  Visit Diagnosis: Acute pain of right knee  Stiffness of right knee, not elsewhere classified  Muscle weakness (generalized)  Other abnormalities of gait and mobility  Localized edema  Unsteadiness on feet     Problem List Patient Active Problem List   Diagnosis Date Noted   DJD (degenerative joint disease) of knee 08/09/2021   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   Unilateral primary osteoarthritis, right knee 06/06/2021   Status post total left knee replacement 02/15/2021   Unilateral primary osteoarthritis,  left knee 01/04/2021   Coronary artery calcification 04/24/2017   Hyperlipidemia 04/24/2017   Acute bronchitis 09/29/2014   SBO (small bowel obstruction) ([place]) 09/27/2014   Nausea and vomiting 09/27/2014   Hypothyroidism 09/27/2014   Leukocytosis 09/27/2014    Scot Jun, PT, DPT, OCS, ATC 08/30/21  12:23 PM   [facility] [address] [place], [place], [address] Phone: [phone]   Fax:  [phone]  Name: Anairis Knick MRN: [id number] Date of Birth: 1946-06-14

## 2021-08-31 ENCOUNTER — Encounter: Payer: Self-pay | Admitting: Rehabilitative and Restorative Service Providers"

## 2021-08-31 ENCOUNTER — Ambulatory Visit (INDEPENDENT_AMBULATORY_CARE_PROVIDER_SITE_OTHER): Payer: Medicare Other | Admitting: Rehabilitative and Restorative Service Providers"

## 2021-08-31 ENCOUNTER — Other Ambulatory Visit: Payer: Self-pay

## 2021-08-31 DIAGNOSIS — M25561 Pain in right knee: Secondary | ICD-10-CM | POA: Diagnosis not present

## 2021-08-31 DIAGNOSIS — R2681 Unsteadiness on feet: Secondary | ICD-10-CM

## 2021-08-31 DIAGNOSIS — M6281 Muscle weakness (generalized): Secondary | ICD-10-CM

## 2021-08-31 DIAGNOSIS — R2689 Other abnormalities of gait and mobility: Secondary | ICD-10-CM | POA: Diagnosis not present

## 2021-08-31 DIAGNOSIS — M25661 Stiffness of right knee, not elsewhere classified: Secondary | ICD-10-CM

## 2021-08-31 DIAGNOSIS — R600 Localized edema: Secondary | ICD-10-CM

## 2021-08-31 NOTE — Therapy (Signed)
[facility] [address] [place], [place], [address] Phone: [phone]   Fax:  [phone]  Physical Therapy Treatment  Patient Details  Name: Katie Weber MRN: [id number] Date of Birth: 09-21-1946 Referring Provider (PT): Jean Rosenthal, MD   Encounter Date: 08/31/2021   PT End of Session - 08/31/21 1341     Visit Number 3    Number of Visits 18    Date for PT Re-Evaluation 10/21/21    Authorization Type Med A/B and generic comm secondary    Progress Note Due on Visit 10    PT Start Time 1339    PT Stop Time 1419    PT Time Calculation (min) 40 min    Activity Tolerance Patient tolerated treatment well    Behavior During Therapy [facility] for tasks assessed/performed             Past Medical History:  Diagnosis Date   Anemia    when pt was younger   Arthritis    arthritis fingers   Asthma    Coronary artery calcification 04/24/2017   Diverticulitis    x2 -last 1'17 tx. outpatient.   Environmental and seasonal allergies    2014 Possibly related to air freshners.   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   Family history of anesthesia complication 67EHM ago   daughter had an allergic reaction to atropine   GERD (gastroesophageal reflux disease)    no meds   History of hiatal hernia 09/14/2014   seen on xray   History of kidney stones    '80's lithotripsy-[facility] x1   History of kidney stones    x1   Hyperlipidemia    Hypothyroidism    Pneumonia 2014   Pure hypercholesterolemia    Tremor of left hand     Past Surgical History:  Procedure Laterality Date   BALLOON DILATION N/A 05/04/2014   Procedure: BALLOON DILATION;  Surgeon: Garlan Fair, MD;  Location: [facility];  Service: Endoscopy;  Laterality: N/A;   BREAST LUMPECTOMY Right 1972   benign    BREAST SURGERY Right    '72 -benign tumor   childbirth- NVD x2      COLONOSCOPY WITH  PROPOFOL N/A 05/04/2014   Procedure: COLONOSCOPY WITH PROPOFOL;  Surgeon: Garlan Fair, MD;  Location: [facility];  Service: Endoscopy;  Laterality: N/A;   DILATION AND CURETTAGE OF UTERUS     04-20-14 [facility] hospital endometrial polyp removed.   ESOPHAGOGASTRODUODENOSCOPY (EGD) WITH PROPOFOL N/A 05/04/2014   Procedure: ESOPHAGOGASTRODUODENOSCOPY (EGD) WITH PROPOFOL;  Surgeon: Garlan Fair, MD;  [facility]: [facility];  Service: Endoscopy;  Laterality: N/A;. Procedure done in [place], [place]- 5 yrs ago.   HYSTEROSCOPY WITH D & C N/A 04/20/2014   Procedure: DILATATION AND CURETTAGE /HYSTEROSCOPY;  Surgeon: [name], MD;  Location: [facility];  Service: Gynecology;  Laterality: N/A;   LITHOTRIPSY     POLYPECTOMY  2020   Dr. Lear Ng   TOTAL KNEE ARTHROPLASTY Left 02/15/2021   Procedure: LEFT TOTAL KNEE ARTHROPLASTY;  Surgeon: Mcarthur Rossetti, MD;  Location: [place];  Service: Orthopedics;  Laterality: Left;   TOTAL KNEE ARTHROPLASTY Right 08/09/2021   Procedure: RIGHT TOTAL KNEE ARTHROPLASTY;  Surgeon: Mcarthur Rossetti, MD;  Location: [place];  Service: Orthopedics;  Laterality: Right;  Needs RNFA   TUBAL LIGATION      There were no vitals filed for this visit.  Subjective Assessment - 08/31/21 1340     Subjective Pt. indicated feeling really limber after last visit.  Felt stiff this morning.  Didn't indicated specific pain upon arrival, just stiff.    Pertinent History arthritis, Left TKA 02/15/21, HLD, asthma    Patient Stated Goals to walk trials and active in community    Currently in Pain? No/denies    Pain Score 0-No pain    Pain Location Knee    Pain Orientation Right    Pain Descriptors / Indicators Tightness    Pain Type Surgical pain    Pain Onset 1 to 4 weeks ago    Pain Frequency Intermittent    Aggravating Factors  stiffness this morning    Pain Relieving Factors movement in visit helped a lot                                [facility] Adult PT Treatment/Exercise - 08/31/21 0001       Knee/Hip Exercises: Stretches   Gastroc Stretch 5 reps;30 seconds;Both   incilne board     Knee/Hip Exercises: Aerobic   Nustep Lvl 5 10 mins LE only      Knee/Hip Exercises: Machines for Strengthening   Cybex Leg Press Double leg 2 x 15 75 lbs, single leg Rt 2 x 15 31 lbs      Knee/Hip Exercises: Seated   Long Arc Quad Right;3 sets;10 reps   contralateral leg opposite, pause in end ranges for stretch   Long Arc Quad Weight 4 lbs.    Other Seated Knee/Hip Exercises alternating 5 sec ext/flexion isometrics 2 mins total    Sit to Sand 2 sets;10 reps;without UE support                     PT Education - 08/31/21 1403     Education Details Discussion on use of a foam roller they purchased for home.  Main encouragement was avoiding prolonged propping behind knee to encouraged ontinued stretching for extension.    Person(s) Educated Patient;Spouse    Methods Explanation    Comprehension Verbalized understanding              PT Short Term Goals - 08/31/21 1403       PT SHORT TERM GOAL #1   Title Pt will be independent in her initial HEP.    Time 4    Period Weeks    Status On-going    Target Date 09/23/21      PT SHORT TERM GOAL #2   Title Patient reports 50% improvement in sleep    Time 4    Period Weeks    Status On-going    Target Date 09/23/21      PT SHORT TERM GOAL #3   Title right knee PROM -5* ext to 100* flexion    Time 4    Period Weeks    Status On-going    Target Date 09/23/21      PT SHORT TERM GOAL #4   Title interum FOTO score improvement >62%    Time 4    Period Weeks    Status On-going    Target Date 09/23/21               PT Long Term Goals - 08/29/21 0940       PT LONG TERM GOAL #1   Title Pt will be independent in her advanced HEP.  Time 8    Period Weeks    Status New    Target Date 10/21/21      PT LONG TERM GOAL  #2   Title pt reports no pain in knee with standing, gait & functional activities    Time 8    Period Weeks    Status New    Target Date 10/21/21      PT LONG TERM GOAL #3   Title Pt will improve her FOTO to >/= 65    Time 8    Period Weeks    Status New    Target Date 10/21/21      PT LONG TERM GOAL #4   Title Pt will improve her Single Leg Stance to >/= 15 seconds on right LE.    Time 8    Period Weeks    Status New    Target Date 10/21/21      PT LONG TERM GOAL #5   Title Pt will be able to ambulate 500', negotiate ramp & curb and climb a flight of stairs with step over step pattern with no UE support    Time 8    Period Weeks    Status New    Target Date 10/21/21      Additional Long Term Goals   Additional Long Term Goals Yes      PT LONG TERM GOAL #6   Title right knee AROM -3* ext to 100* antigravity    Time 8    Period Weeks    Status New    Target Date 10/21/21                   Plan - 08/31/21 1404     Clinical Impression Statement Discussed as noted in patient education about foam roller use for home.  Continued early progress in overall mobility and muscle recruitment.  Pt. to benefit from continued skilled PT services.    Personal Factors and Comorbidities Comorbidity 2    Comorbidities arthritis, Left TKA 02/15/21, HLD, asthma    Examination-Activity Limitations Carry;Locomotion Level;Sleep;Squat;Stairs;Stand;Transfers    Examination-Participation Restrictions Community Activity    Stability/Clinical Decision Making Stable/Uncomplicated    Rehab Potential Good    PT Frequency 3x / week   3x/wk for 3 weeks, then 2x/wk for 5 weeks   PT Duration 8 weeks    PT Treatment/Interventions ADLs/Self Care Home Management;Cryotherapy;Moist Heat;DME Instruction;Gait training;Stair training;Functional mobility training;Therapeutic activities;Therapeutic exercise;Balance training;Neuromuscular re-education;Patient/family education;Manual techniques;Scar  mobilization;Passive range of motion;Taping;Vasopneumatic Device;Joint Manipulations    PT Next Visit Plan Static and compliant surface balance introduction, continued strengthening and end range flexion gains.    PT Home Exercise Plan Access Code: [email or web address]    Consulted and Agree with Plan of Care Patient             Patient will benefit from skilled therapeutic intervention in order to improve the following deficits and impairments:  Abnormal gait, Decreased activity tolerance, Decreased balance, Decreased endurance, Decreased mobility, Decreased range of motion, Decreased scar mobility, Decreased strength, Difficulty walking, Increased edema, Impaired flexibility, Obesity, Pain, Postural dysfunction  Visit Diagnosis: Acute pain of right knee  Stiffness of right knee, not elsewhere classified  Muscle weakness (generalized)  Other abnormalities of gait and mobility  Localized edema  Unsteadiness on feet     Problem List Patient Active Problem List   Diagnosis Date Noted   DJD (degenerative joint disease) of knee 08/09/2021   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 [place], [place], [address] Phone: [phone]   Fax:  [phone]  Name: Amiri Tritch MRN: [id number] Date of Birth: Jul 19, 1946

## 2021-09-02 ENCOUNTER — Other Ambulatory Visit: Payer: Self-pay | Admitting: Orthopaedic Surgery

## 2021-09-05 ENCOUNTER — Other Ambulatory Visit: Payer: Self-pay

## 2021-09-05 ENCOUNTER — Ambulatory Visit (INDEPENDENT_AMBULATORY_CARE_PROVIDER_SITE_OTHER): Payer: Medicare Other | Admitting: Physical Therapy

## 2021-09-05 ENCOUNTER — Encounter: Payer: Self-pay | Admitting: Physical Therapy

## 2021-09-05 DIAGNOSIS — M25661 Stiffness of right knee, not elsewhere classified: Secondary | ICD-10-CM | POA: Diagnosis not present

## 2021-09-05 DIAGNOSIS — R600 Localized edema: Secondary | ICD-10-CM

## 2021-09-05 DIAGNOSIS — R2689 Other abnormalities of gait and mobility: Secondary | ICD-10-CM | POA: Diagnosis not present

## 2021-09-05 DIAGNOSIS — M6281 Muscle weakness (generalized): Secondary | ICD-10-CM | POA: Diagnosis not present

## 2021-09-05 DIAGNOSIS — R2681 Unsteadiness on feet: Secondary | ICD-10-CM

## 2021-09-05 DIAGNOSIS — M25561 Pain in right knee: Secondary | ICD-10-CM

## 2021-09-05 NOTE — Therapy (Signed)
[facility] [address] [place], [place], [address] Phone: [phone]   Fax:  [phone]  Physical Therapy Treatment  Patient Details  Name: Katie Weber MRN: [id number] Date of Birth: 02/15/1946 Referring Provider (PT): Jean Rosenthal, MD   Encounter Date: 09/05/2021   PT End of Session - 09/05/21 1057     Visit Number 4    Number of Visits 18    Date for PT Re-Evaluation 10/21/21    Authorization Type Med A/B and generic comm secondary    Progress Note Due on Visit 10    PT Start Time 1057    PT Stop Time 1153    PT Time Calculation (min) 56 min    Activity Tolerance Patient tolerated treatment well    Behavior During Therapy [facility] for tasks assessed/performed             Past Medical History:  Diagnosis Date   Anemia    when pt was younger   Arthritis    arthritis fingers   Asthma    Coronary artery calcification 04/24/2017   Diverticulitis    x2 -last 1'17 tx. outpatient.   Environmental and seasonal allergies    2014 Possibly related to air freshners.   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   Family history of anesthesia complication 81EHU ago   daughter had an allergic reaction to atropine   GERD (gastroesophageal reflux disease)    no meds   History of hiatal hernia 09/14/2014   seen on xray   History of kidney stones    '80's lithotripsy-[facility] x1   History of kidney stones    x1   Hyperlipidemia    Hypothyroidism    Pneumonia 2014   Pure hypercholesterolemia    Tremor of left hand     Past Surgical History:  Procedure Laterality Date   BALLOON DILATION N/A 05/04/2014   Procedure: BALLOON DILATION;  Surgeon: Garlan Fair, MD;  [facility];  Service: Endoscopy;  Laterality: N/A;   BREAST LUMPECTOMY Right 1972   benign    BREAST SURGERY Right    '72 -benign tumor   childbirth- NVD x2      COLONOSCOPY WITH  PROPOFOL N/A 05/04/2014   Procedure: COLONOSCOPY WITH PROPOFOL;  Surgeon: Garlan Fair, MD;  [facility];  Service: Endoscopy;  Laterality: N/A;   DILATION AND CURETTAGE OF UTERUS     04-20-14 [facility] hospital endometrial polyp removed.   ESOPHAGOGASTRODUODENOSCOPY (EGD) WITH PROPOFOL N/A 05/04/2014   Procedure: ESOPHAGOGASTRODUODENOSCOPY (EGD) WITH PROPOFOL;  Surgeon: Garlan Fair, MD;  [facility];  Service: Endoscopy;  Laterality: N/A;. Procedure done in [place], [place]- 5 yrs ago.   HYSTEROSCOPY WITH D & C N/A 04/20/2014   Procedure: DILATATION AND CURETTAGE /HYSTEROSCOPY;  Surgeon: [name], MD;  Location: [facility];  Service: Gynecology;  Laterality: N/A;   LITHOTRIPSY     POLYPECTOMY  2020   Dr. Lear Ng   TOTAL KNEE ARTHROPLASTY Left 02/15/2021   Procedure: LEFT TOTAL KNEE ARTHROPLASTY;  Surgeon: Mcarthur Rossetti, MD;  Location: [place];  Service: Orthopedics;  Laterality: Left;   TOTAL KNEE ARTHROPLASTY Right 08/09/2021   Procedure: RIGHT TOTAL KNEE ARTHROPLASTY;  Surgeon: Mcarthur Rossetti, MD;  Location: [facility];  Service: Orthopedics;  Laterality: Right;  Needs RNFA   TUBAL LIGATION      There were no vitals filed for this visit.  Subjective Assessment - 09/05/21 1058     Subjective She was on her feet more for Thanksgiving but did not increase her pain. She slept well last night without oxy.    Pertinent History arthritis, Left TKA 02/15/21, HLD, asthma    Patient Stated Goals to walk trials and active in community    Currently in Pain? Yes    Pain Score 7    since last PT session, lowest 0/10 highest 5-6/10   Pain Location Knee    Pain Orientation Right    Pain Descriptors / Indicators Aching;Sharp    Pain Type Surgical pain    Pain Onset More than a month ago    Pain Frequency Intermittent    Aggravating Factors  flexing knee    Pain Relieving Factors moving knee                [facility] PT Assessment - 09/05/21 1058        Assessment   Medical Diagnosis Z96.651 (ICD-10-CM) - Status post total right knee replacement    Referring Provider (PT) Jean Rosenthal, MD    Onset Date/Surgical Date 08/09/21      PROM   Right Knee Flexion 104   seated after manual therapy   Left Knee Extension -4   supine after manual therapy                          [facility] Adult PT Treatment/Exercise - 09/05/21 1058       Ambulation/Gait   Pre-Gait Activities stepping over 2" block 10" long with terminal stance knee flexion to initial contact heel strike with wt shift onto RLE ea position LLE heel rise & toe rise 20 reps.      Knee/Hip Exercises: Stretches   Gastroc Stretch 5 reps;30 seconds;Both   incilne board     Knee/Hip Exercises: Aerobic   Recumbent Bike seat 3 rocking back/forth with 5 sec hold max flexion 8 min full range backwards last 1 min with heel on pedal    Nustep --      Knee/Hip Exercises: Machines for Strengthening   Cybex Leg Press Double leg 2 x 15 75 lbs, single leg Rt 2 x 15 31 lbs   verbal & tactile cues on knee control     Knee/Hip Exercises: Standing   Forward Step Up Both;1 set;10 reps;Hand Hold: 2;Step Height: 6"    Forward Step Up Limitations PT demo & verbal cues on technique    Step Down Both;1 set;10 reps;Hand Hold: 2;Step Height: 6"    Step Down Limitations PT demo & verbal cues on technique      Knee/Hip Exercises: Seated   Long Arc Quad --    Long Arc [employer] --    Other Seated Knee/Hip Exercises --    Sit to [employer] --      Modalities   Modalities Vasopneumatic      Vasopneumatic   Number Minutes Vasopneumatic  10 minutes    Vasopnuematic Location  Knee    Vasopneumatic Pressure Medium    Vasopneumatic Temperature  34                       PT Short Term Goals - 08/31/21 1403       PT SHORT TERM GOAL #1   Title Pt will be independent in her initial HEP.    Time 4    Period Weeks    Status On-going  Target Date 09/23/21       PT SHORT TERM GOAL #2   Title Patient reports 50% improvement in sleep    Time 4    Period Weeks    Status On-going    Target Date 09/23/21      PT SHORT TERM GOAL #3   Title right knee PROM -5* ext to 100* flexion    Time 4    Period Weeks    Status On-going    Target Date 09/23/21      PT SHORT TERM GOAL #4   Title interum FOTO score improvement >62%    Time 4    Period Weeks    Status On-going    Target Date 09/23/21               PT Long Term Goals - 08/29/21 0940       PT LONG TERM GOAL #1   Title Pt will be independent in her advanced HEP.    Time 8    Period Weeks    Status New    Target Date 10/21/21      PT LONG TERM GOAL #2   Title pt reports no pain in knee with standing, gait & functional activities    Time 8    Period Weeks    Status New    Target Date 10/21/21      PT LONG TERM GOAL #3   Title Pt will improve her FOTO to >/= 65    Time 8    Period Weeks    Status New    Target Date 10/21/21      PT LONG TERM GOAL #4   Title Pt will improve her Single Leg Stance to >/= 15 seconds on right LE.    Time 8    Period Weeks    Status New    Target Date 10/21/21      PT LONG TERM GOAL #5   Title Pt will be able to ambulate 500', negotiate ramp & curb and climb a flight of stairs with step over step pattern with no UE support    Time 8    Period Weeks    Status New    Target Date 10/21/21      Additional Long Term Goals   Additional Long Term Goals Yes      PT LONG TERM GOAL #6   Title right knee AROM -3* ext to 100* antigravity    Time 8    Period Weeks    Status New    Target Date 10/21/21                   Plan - 09/05/21 1057     Clinical Impression Statement Pt AROM & PROM are improving.  PT instucted in pregait knee exercise including weight shifts and she had noted improvement in gait at end of session.  Pt continues to benefit from skilled PT to improve function & mobility.    Personal Factors and Comorbidities  Comorbidity 2    Comorbidities arthritis, Left TKA 02/15/21, HLD, asthma    Examination-Activity Limitations Carry;Locomotion Level;Sleep;Squat;Stairs;Stand;Transfers    Examination-Participation Restrictions Community Activity    Stability/Clinical Decision Making Stable/Uncomplicated    Rehab Potential Good    PT Frequency 3x / week   3x/wk for 3 weeks, then 2x/wk for 5 weeks   PT Duration 8 weeks    PT Treatment/Interventions ADLs/Self Care Home Management;Cryotherapy;Moist Heat;DME Instruction;Gait training;Stair training;Functional mobility training;Therapeutic activities;Therapeutic  exercise;Balance training;Neuromuscular re-education;Patient/family education;Manual techniques;Scar mobilization;Passive range of motion;Taping;Vasopneumatic Device;Joint Manipulations    PT Next Visit Plan add Static and compliant surface balance introduction, continued strengthening and end range flexion gains.    PT Home Exercise Plan Access Code: [email or web address]    Consulted and Agree with Plan of Care Patient             Patient will benefit from skilled therapeutic intervention in order to improve the following deficits and impairments:  Abnormal gait, Decreased activity tolerance, Decreased balance, Decreased endurance, Decreased mobility, Decreased range of motion, Decreased scar mobility, Decreased strength, Difficulty walking, Increased edema, Impaired flexibility, Obesity, Pain, Postural dysfunction  Visit Diagnosis: Acute pain of right knee  Stiffness of right knee, not elsewhere classified  Muscle weakness (generalized)  Other abnormalities of gait and mobility  Localized edema  Unsteadiness on feet     Problem List Patient Active Problem List   Diagnosis Date Noted   DJD (degenerative joint disease) of knee 08/09/2021   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   Unilateral primary osteoarthritis, right knee 06/06/2021   Status post total left knee replacement 02/15/2021    Unilateral primary osteoarthritis, left knee 01/04/2021   Coronary artery calcification 04/24/2017   Hyperlipidemia 04/24/2017   Acute bronchitis 09/29/2014   SBO (small bowel obstruction) ([place]) 09/27/2014   Nausea and vomiting 09/27/2014   Hypothyroidism 09/27/2014   Leukocytosis 09/27/2014    Jamey Reas, PT, DPT 09/05/2021, 12:11 PM  [facility] [address] [place], [place], [address] Phone: [phone]   Fax:  [phone]  Name: Prentiss Hammett MRN: [id number] Date of Birth: 11/19/1945

## 2021-09-07 ENCOUNTER — Other Ambulatory Visit: Payer: Self-pay

## 2021-09-07 ENCOUNTER — Encounter: Payer: Self-pay | Admitting: Physical Therapy

## 2021-09-07 ENCOUNTER — Ambulatory Visit (INDEPENDENT_AMBULATORY_CARE_PROVIDER_SITE_OTHER): Payer: Medicare Other | Admitting: Physical Therapy

## 2021-09-07 DIAGNOSIS — R2689 Other abnormalities of gait and mobility: Secondary | ICD-10-CM | POA: Diagnosis not present

## 2021-09-07 DIAGNOSIS — M6281 Muscle weakness (generalized): Secondary | ICD-10-CM | POA: Diagnosis not present

## 2021-09-07 DIAGNOSIS — M25561 Pain in right knee: Secondary | ICD-10-CM | POA: Diagnosis not present

## 2021-09-07 DIAGNOSIS — R2681 Unsteadiness on feet: Secondary | ICD-10-CM

## 2021-09-07 DIAGNOSIS — M25661 Stiffness of right knee, not elsewhere classified: Secondary | ICD-10-CM | POA: Diagnosis not present

## 2021-09-07 DIAGNOSIS — R600 Localized edema: Secondary | ICD-10-CM

## 2021-09-07 NOTE — Therapy (Signed)
[facility] Physical Therapy [address] [place], [place], [address] Phone: [phone]   Fax:  [phone]  Physical Therapy Treatment  Patient Details  Name: Katie Weber MRN: [id number] Date of Birth: Mar 22, 1946 Referring Provider (PT): Jean Rosenthal, MD   Encounter Date: 09/07/2021   PT End of Session - 09/07/21 1100     Visit Number 5    Number of Visits 18    Date for PT Re-Evaluation 10/21/21    Authorization Type Med A/B and generic comm secondary    Progress Note Due on Visit 10    PT Start Time 1100    PT Stop Time 1153    PT Time Calculation (min) 53 min    Activity Tolerance Patient tolerated treatment well    Behavior During Therapy [facility] for tasks assessed/performed             Past Medical History:  Diagnosis Date   Anemia    when pt was younger   Arthritis    arthritis fingers   Asthma    Coronary artery calcification 04/24/2017   Diverticulitis    x2 -last 1'17 tx. outpatient.   Environmental and seasonal allergies    2014 Possibly related to air freshners.   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   Family history of anesthesia complication 29UTM ago   daughter had an allergic reaction to atropine   GERD (gastroesophageal reflux disease)    no meds   History of hiatal hernia 09/14/2014   seen on xray   History of kidney stones    '80's lithotripsy-[facility] x1   History of kidney stones    x1   Hyperlipidemia    Hypothyroidism    Pneumonia 2014   Pure hypercholesterolemia    Tremor of left hand     Past Surgical History:  Procedure Laterality Date   BALLOON DILATION N/A 05/04/2014   Procedure: BALLOON DILATION;  Surgeon: Garlan Fair, MD;  Location: [facility];  Service: Endoscopy;  Laterality: N/A;   BREAST LUMPECTOMY Right 1972   benign    BREAST SURGERY Right    '72 -benign tumor   childbirth- NVD x2      COLONOSCOPY WITH  PROPOFOL N/A 05/04/2014   Procedure: COLONOSCOPY WITH PROPOFOL;  Surgeon: Garlan Fair, MD;  Location: [facility];  Service: Endoscopy;  Laterality: N/A;   DILATION AND CURETTAGE OF UTERUS     04-20-14 [facility] endometrial polyp removed.   ESOPHAGOGASTRODUODENOSCOPY (EGD) WITH PROPOFOL N/A 05/04/2014   Procedure: ESOPHAGOGASTRODUODENOSCOPY (EGD) WITH PROPOFOL;  Surgeon: Garlan Fair, MD;  [facility]: [facility];  Service: Endoscopy;  Laterality: N/A;. Procedure done in [place], [place]- 5 yrs ago.   HYSTEROSCOPY WITH D & C N/A 04/20/2014   Procedure: DILATATION AND CURETTAGE /HYSTEROSCOPY;  Surgeon: [name], MD;  Location: [facility];  Service: Gynecology;  Laterality: N/A;   LITHOTRIPSY     POLYPECTOMY  2020   Dr. Lear Ng   TOTAL KNEE ARTHROPLASTY Left 02/15/2021   Procedure: LEFT TOTAL KNEE ARTHROPLASTY;  Surgeon: Mcarthur Rossetti, MD;  Location: [place];  Service: Orthopedics;  Laterality: Left;   TOTAL KNEE ARTHROPLASTY Right 08/09/2021   Procedure: RIGHT TOTAL KNEE ARTHROPLASTY;  Surgeon: Mcarthur Rossetti, MD;  Location: [place];  Service: Orthopedics;  Laterality: Right;  Needs RNFA   TUBAL LIGATION      There were no vitals filed for this visit.  Subjective Assessment - 09/07/21 1100     Subjective She has been doing her exercises.    Pertinent History arthritis, Left TKA 02/15/21, HLD, asthma    Patient Stated Goals to walk trials and active in community    Currently in Pain? Yes    Pain Score 2    since last PT appt, lowest 0/10 - highest 5/10   Pain Location Knee    Pain Orientation Right    Pain Descriptors / Indicators Aching;Sharp    Pain Onset More than a month ago                [facility] PT Assessment - 09/07/21 1100       Assessment   Medical Diagnosis Z96.651 (ICD-10-CM) - Status post total right knee replacement    Referring Provider (PT) Jean Rosenthal, MD    Onset Date/Surgical Date 08/09/21      PROM   Right  Knee Extension -7   supine after manual therapy   Right Knee Flexion 110   seated after manual therapy                          [facility] Adult PT Treatment/Exercise - 09/07/21 1100       Self-Care   Self-Care Heat/Ice Application;Scar Mobilizations    Scar Mobilizations PT demo & verbal cues on scar mobilization    Heat/Ice Application PT demo & verbal cues on ice massage. Pt return demo understanding.      Neuro Re-ed    Neuro Re-ed Details  foam beam - tandem stance 60sec intermittent touch RLE in front 1 rep & RLE in back 1 rep.  and crossways stance 1 min without UE touch      Knee/Hip Exercises: Stretches   Press photographer --      Knee/Hip Exercises: Aerobic   Recumbent Bike Sci Fit Seat 6 level 1 with LEs only 8 min      Knee/Hip Exercises: Machines for Strengthening   Cybex Leg Press Double leg 2 x 15 100 lbs, single leg Rt 2 x 15 81 lbs   verbal & tactile cues on knee control     Knee/Hip Exercises: Standing   Forward Step Up Both;1 set;10 reps;Hand Hold: 2;Step Height: 6"    Forward Step Up Limitations PT demo & verbal cues on technique    Step Down Both;1 set;10 reps;Hand Hold: 2;Step Height: 6"    Step Down Limitations PT demo & verbal cues on technique    Rocker Board 1 minute   ant/post and right/left, BUE support, square board w/2 pivot points   Rocker Board Limitations demo & verbal cues on technique      Modalities   Modalities Vasopneumatic      Vasopneumatic   Number Minutes Vasopneumatic  10 minutes    Vasopnuematic Location  Knee    Vasopneumatic Pressure Medium    Vasopneumatic Temperature  34                       PT Short Term Goals - 08/31/21 1403       PT SHORT TERM GOAL #1   Title Pt will be independent in her initial HEP.    Time 4    Period Weeks    Status On-going    Target Date 09/23/21      PT SHORT TERM GOAL #2   Title Patient reports 50% improvement in sleep  Time 4    Period Weeks    Status  On-going    Target Date 09/23/21      PT SHORT TERM GOAL #3   Title right knee PROM -5* ext to 100* flexion    Time 4    Period Weeks    Status On-going    Target Date 09/23/21      PT SHORT TERM GOAL #4   Title interum FOTO score improvement >62%    Time 4    Period Weeks    Status On-going    Target Date 09/23/21               PT Long Term Goals - 08/29/21 0940       PT LONG TERM GOAL #1   Title Pt will be independent in her advanced HEP.    Time 8    Period Weeks    Status New    Target Date 10/21/21      PT LONG TERM GOAL #2   Title pt reports no pain in knee with standing, gait & functional activities    Time 8    Period Weeks    Status New    Target Date 10/21/21      PT LONG TERM GOAL #3   Title Pt will improve her FOTO to >/= 65    Time 8    Period Weeks    Status New    Target Date 10/21/21      PT LONG TERM GOAL #4   Title Pt will improve her Single Leg Stance to >/= 15 seconds on right LE.    Time 8    Period Weeks    Status New    Target Date 10/21/21      PT LONG TERM GOAL #5   Title Pt will be able to ambulate 500', negotiate ramp & curb and climb a flight of stairs with step over step pattern with no UE support    Time 8    Period Weeks    Status New    Target Date 10/21/21      Additional Long Term Goals   Additional Long Term Goals Yes      PT LONG TERM GOAL #6   Title right knee AROM -3* ext to 100* antigravity    Time 8    Period Weeks    Status New    Target Date 10/21/21                   Plan - 09/07/21 1109     Clinical Impression Statement Pt's PROM continues to improve.  She appears to understand ice massage and scar mobs including rationale.  PT progressed to standing functional exercises which patient tolerated well.  Pt continues to benefit from skilled PT    Personal Factors and Comorbidities Comorbidity 2    Comorbidities arthritis, Left TKA 02/15/21, HLD, asthma    Examination-Activity Limitations  Carry;Locomotion Level;Sleep;Squat;Stairs;Stand;Transfers    Examination-Participation Restrictions Community Activity    Stability/Clinical Decision Making Stable/Uncomplicated    Rehab Potential Good    PT Frequency 3x / week   3x/wk for 3 weeks, then 2x/wk for 5 weeks   PT Duration 8 weeks    PT Treatment/Interventions ADLs/Self Care Home Management;Cryotherapy;Moist Heat;DME Instruction;Gait training;Stair training;Functional mobility training;Therapeutic activities;Therapeutic exercise;Balance training;Neuromuscular re-education;Patient/family education;Manual techniques;Scar mobilization;Passive range of motion;Taping;Vasopneumatic Device;Joint Manipulations    PT Next Visit Plan add Static and compliant surface balance introduction, continued strengthening and end  range flexion gains.    PT Home Exercise Plan Access Code: [email or web address]    Consulted and Agree with Plan of Care Patient             Patient will benefit from skilled therapeutic intervention in order to improve the following deficits and impairments:  Abnormal gait, Decreased activity tolerance, Decreased balance, Decreased endurance, Decreased mobility, Decreased range of motion, Decreased scar mobility, Decreased strength, Difficulty walking, Increased edema, Impaired flexibility, Obesity, Pain, Postural dysfunction  Visit Diagnosis: Acute pain of right knee  Stiffness of right knee, not elsewhere classified  Muscle weakness (generalized)  Other abnormalities of gait and mobility  Localized edema  Unsteadiness on feet     Problem List Patient Active Problem List   Diagnosis Date Noted   DJD (degenerative joint disease) of knee 08/09/2021   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   Unilateral primary osteoarthritis, right knee 06/06/2021   Status post total left knee replacement 02/15/2021   Unilateral primary osteoarthritis, left knee 01/04/2021   Coronary artery calcification 04/24/2017    Hyperlipidemia 04/24/2017   Acute bronchitis 09/29/2014   SBO (small bowel obstruction) ([place]) 09/27/2014   Nausea and vomiting 09/27/2014   Hypothyroidism 09/27/2014   Leukocytosis 09/27/2014    Jamey Reas, PT, DPT 09/07/2021, 2:00 PM  [facility] Physical Therapy [address] [place], [place], [address] Phone: [phone]   Fax:  [phone]  Name: Tyleah Loh MRN: [id number] Date of Birth: 02-26-1946

## 2021-09-08 ENCOUNTER — Encounter: Payer: Self-pay | Admitting: Physical Therapy

## 2021-09-08 ENCOUNTER — Ambulatory Visit (INDEPENDENT_AMBULATORY_CARE_PROVIDER_SITE_OTHER): Payer: Medicare Other | Admitting: Physical Therapy

## 2021-09-08 DIAGNOSIS — M6281 Muscle weakness (generalized): Secondary | ICD-10-CM | POA: Diagnosis not present

## 2021-09-08 DIAGNOSIS — M25561 Pain in right knee: Secondary | ICD-10-CM | POA: Diagnosis not present

## 2021-09-08 DIAGNOSIS — M25661 Stiffness of right knee, not elsewhere classified: Secondary | ICD-10-CM | POA: Diagnosis not present

## 2021-09-08 DIAGNOSIS — R600 Localized edema: Secondary | ICD-10-CM

## 2021-09-08 DIAGNOSIS — R2689 Other abnormalities of gait and mobility: Secondary | ICD-10-CM | POA: Diagnosis not present

## 2021-09-08 DIAGNOSIS — R2681 Unsteadiness on feet: Secondary | ICD-10-CM

## 2021-09-08 NOTE — Therapy (Signed)
[facility] Physical Therapy [address] [place], [place], [address] Phone: [phone]   Fax:  [phone]  Physical Therapy Treatment  Patient Details  Name: Katie Weber MRN: [id number] Date of Birth: 09-04-1946 Referring Provider (PT): Jean Rosenthal, MD   Encounter Date: 09/08/2021   PT End of Session - 09/08/21 0849     Visit Number 6    Number of Visits 18    Date for PT Re-Evaluation 10/21/21    Authorization Type Med A/B and generic comm secondary    Progress Note Due on Visit 10    PT Start Time 0847    PT Stop Time 0943    PT Time Calculation (min) 56 min    Activity Tolerance Patient tolerated treatment well    Behavior During Therapy [facility] for tasks assessed/performed             Past Medical History:  Diagnosis Date   Anemia    when pt was younger   Arthritis    arthritis fingers   Asthma    Coronary artery calcification 04/24/2017   Diverticulitis    x2 -last 1'17 tx. outpatient.   Environmental and seasonal allergies    2014 Possibly related to air freshners.   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   Family history of anesthesia complication 72ZDG ago   daughter had an allergic reaction to atropine   GERD (gastroesophageal reflux disease)    no meds   History of hiatal hernia 09/14/2014   seen on xray   History of kidney stones    '80's lithotripsy-[facility] x1   History of kidney stones    x1   Hyperlipidemia    Hypothyroidism    Pneumonia 2014   Pure hypercholesterolemia    Tremor of left hand     Past Surgical History:  Procedure Laterality Date   BALLOON DILATION N/A 05/04/2014   Procedure: BALLOON DILATION;  Surgeon: Garlan Fair, MD;  [facility]: [facility];  Service: Endoscopy;  Laterality: N/A;   BREAST LUMPECTOMY Right 1972   benign    BREAST SURGERY Right    '72 -benign tumor   childbirth- NVD x2      COLONOSCOPY WITH  PROPOFOL N/A 05/04/2014   Procedure: COLONOSCOPY WITH PROPOFOL;  Surgeon: Garlan Fair, MD;  [facility]: [facility];  Service: Endoscopy;  Laterality: N/A;   DILATION AND CURETTAGE OF UTERUS     04-20-14 [facility] endometrial polyp removed.   ESOPHAGOGASTRODUODENOSCOPY (EGD) WITH PROPOFOL N/A 05/04/2014   Procedure: ESOPHAGOGASTRODUODENOSCOPY (EGD) WITH PROPOFOL;  Surgeon: Garlan Fair, MD;  [facility];  Service: Endoscopy;  Laterality: N/A;. Procedure done in [place], [place]- 5 yrs ago.   HYSTEROSCOPY WITH D & C N/A 04/20/2014   Procedure: DILATATION AND CURETTAGE /HYSTEROSCOPY;  Surgeon: [name], MD;  Location: [place] [facility];  Service: Gynecology;  Laterality: N/A;   LITHOTRIPSY     POLYPECTOMY  2020   Dr. Lear Ng   TOTAL KNEE ARTHROPLASTY Left 02/15/2021   Procedure: LEFT TOTAL KNEE ARTHROPLASTY;  Surgeon: Mcarthur Rossetti, MD;  Location: [place];  Service: Orthopedics;  Laterality: Left;   TOTAL KNEE ARTHROPLASTY Right 08/09/2021   Procedure: RIGHT TOTAL KNEE ARTHROPLASTY;  Surgeon: Mcarthur Rossetti, MD;  Location: [place];  Service: Orthopedics;  Laterality: Right;  Needs RNFA   TUBAL LIGATION      There were no vitals filed for this visit.  Subjective Assessment - 09/08/21 0849     Subjective No new c/o since yesterday.    Pertinent History arthritis, Left TKA 02/15/21, HLD, asthma    Patient Stated Goals to walk trials and active in community    Currently in Pain? Yes    Pain Score 6    once on bike   Pain Location Knee    Pain Orientation Right                               [facility] Adult PT Treatment/Exercise - 09/08/21 0001       Ambulation/Gait   Pre-Gait Activities stepping over 2" block 10" long with terminal stance knee flexion to initial contact heel strike with wt shift onto RLE ea position LLE heel rise & toe rise 20 reps; also exaggerated step length to increase stance time on RLE      Neuro Re-ed     Neuro Re-ed Details  foam beam - tandem stance multiple reps with intermittent touch RLE in front 1 rep & RLE in back 1 rep.  Tandem walking on beam 4 reps      Knee/Hip Exercises: Stretches   Gastroc Stretch 5 reps;10 seconds;Both   on incline board     Knee/Hip Exercises: Aerobic   Stationary Bike too difficult and painful on recumbant bike    Recumbent Bike Sci Fit Seat 6 level 1 with LEs only 8 min      Knee/Hip Exercises: Machines for Strengthening   Cybex Leg Press Double leg 2 x 15 125 lbs, single leg Rt 2 x 15 125 lbs   verbal & tactile cues on knee control     Knee/Hip Exercises: Standing   Forward Step Up Right;1 set;10 reps;Step Height: 8";Hand Hold: 1;Hand Hold: 0    Step Down Right;1 set;10 reps;Hand Hold: 2;Step Height: 8"      Modalities   Modalities Vasopneumatic      Vasopneumatic   Number Minutes Vasopneumatic  10 minutes    Vasopnuematic Location  Knee    Vasopneumatic Pressure Medium    Vasopneumatic Temperature  34                       PT Short Term Goals - 08/31/21 1403       PT SHORT TERM GOAL #1   Title Pt will be independent in her initial HEP.    Time 4    Period Weeks    Status On-going    Target Date 09/23/21      PT SHORT TERM GOAL #2   Title Patient reports 50% improvement in sleep    Time 4    Period Weeks    Status On-going    Target Date 09/23/21      PT SHORT TERM GOAL #3   Title right knee PROM -5* ext to 100* flexion    Time 4    Period Weeks    Status On-going    Target Date 09/23/21      PT SHORT TERM GOAL #4   Title interum FOTO score improvement >62%    Time 4    Period Weeks    Status On-going    Target Date 09/23/21               PT Long Term Goals - 08/29/21 0940       PT LONG TERM GOAL #1   Title Pt  will be independent in her advanced HEP.    Time 8    Period Weeks    Status New    Target Date 10/21/21      PT LONG TERM GOAL #2   Title pt reports no pain in knee with standing, gait &  functional activities    Time 8    Period Weeks    Status New    Target Date 10/21/21      PT LONG TERM GOAL #3   Title Pt will improve her FOTO to >/= 65    Time 8    Period Weeks    Status New    Target Date 10/21/21      PT LONG TERM GOAL #4   Title Pt will improve her Single Leg Stance to >/= 15 seconds on right LE.    Time 8    Period Weeks    Status New    Target Date 10/21/21      PT LONG TERM GOAL #5   Title Pt will be able to ambulate 500', negotiate ramp & curb and climb a flight of stairs with step over step pattern with no UE support    Time 8    Period Weeks    Status New    Target Date 10/21/21      Additional Long Term Goals   Additional Long Term Goals Yes      PT LONG TERM GOAL #6   Title right knee AROM -3* ext to 100* antigravity    Time 8    Period Weeks    Status New    Target Date 10/21/21                   Plan - 09/08/21 1249     Clinical Impression Statement Pt continues to improve with strength, gait pattern and balance.    Comorbidities arthritis, Left TKA 02/15/21, HLD, asthma    PT Treatment/Interventions ADLs/Self Care Home Management;Cryotherapy;Moist Heat;DME Instruction;Gait training;Stair training;Functional mobility training;Therapeutic activities;Therapeutic exercise;Balance training;Neuromuscular re-education;Patient/family education;Manual techniques;Scar mobilization;Passive range of motion;Taping;Vasopneumatic Device;Joint Manipulations    PT Next Visit Plan add Static and compliant surface balance introduction, continued strengthening and end range flexion gains.    PT Home Exercise Plan Access Code: [place]             Patient will benefit from skilled therapeutic intervention in order to improve the following deficits and impairments:  Abnormal gait, Decreased activity tolerance, Decreased balance, Decreased endurance, Decreased mobility, Decreased range of motion, Decreased scar mobility, Decreased strength,  Difficulty walking, Increased edema, Impaired flexibility, Obesity, Pain, Postural dysfunction  Visit Diagnosis: Acute pain of right knee  Stiffness of right knee, not elsewhere classified  Muscle weakness (generalized)  Other abnormalities of gait and mobility  Localized edema  Unsteadiness on feet     Problem List Patient Active Problem List   Diagnosis Date Noted   DJD (degenerative joint disease) of knee 08/09/2021   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   Unilateral primary osteoarthritis, right knee 06/06/2021   Status post total left knee replacement 02/15/2021   Unilateral primary osteoarthritis, left knee 01/04/2021   Coronary artery calcification 04/24/2017   Hyperlipidemia 04/24/2017   Acute bronchitis 09/29/2014   SBO (small bowel obstruction) ([place]) 09/27/2014   Nausea and vomiting 09/27/2014   Hypothyroidism 09/27/2014   Leukocytosis 09/27/2014   Madelyn Flavors, PT 09/08/2021, 12:50 PM  [facility] [address] [place], [place], [address] Phone:  [phone]   Fax:  [phone]  Name: Katie Weber MRN: [id number] Date of Birth: [birth year]

## 2021-09-12 ENCOUNTER — Encounter: Payer: Self-pay | Admitting: Rehabilitative and Restorative Service Providers"

## 2021-09-12 ENCOUNTER — Ambulatory Visit (INDEPENDENT_AMBULATORY_CARE_PROVIDER_SITE_OTHER): Payer: Medicare Other | Admitting: Rehabilitative and Restorative Service Providers"

## 2021-09-12 ENCOUNTER — Other Ambulatory Visit: Payer: Self-pay

## 2021-09-12 DIAGNOSIS — M25561 Pain in right knee: Secondary | ICD-10-CM

## 2021-09-12 DIAGNOSIS — R2689 Other abnormalities of gait and mobility: Secondary | ICD-10-CM | POA: Diagnosis not present

## 2021-09-12 DIAGNOSIS — M6281 Muscle weakness (generalized): Secondary | ICD-10-CM | POA: Diagnosis not present

## 2021-09-12 DIAGNOSIS — M25661 Stiffness of right knee, not elsewhere classified: Secondary | ICD-10-CM | POA: Diagnosis not present

## 2021-09-12 DIAGNOSIS — R600 Localized edema: Secondary | ICD-10-CM

## 2021-09-12 DIAGNOSIS — R2681 Unsteadiness on feet: Secondary | ICD-10-CM

## 2021-09-12 NOTE — Therapy (Addendum)
[facility] [address] [place], [place], [address] Phone: [phone]   Fax:  [phone]  Physical Therapy Treatment  Patient Details  Name: Katie [facility] MRN: [id number] Date of Birth: [birth year] Referring Provider (PT): Jean Rosenthal, MD   Encounter Date: 09/12/2021   PT End of Session - 09/12/21 1155     Visit Number 7    Number of Visits 18    Date for PT Re-Evaluation 10/21/21    Authorization Type Med A/B and generic comm secondary    Progress Note Due on Visit 10    PT Start Time 1146    PT Stop Time 1225    PT Time Calculation (min) 39 min    Activity Tolerance Patient tolerated treatment well    Behavior During Therapy [facility] for tasks assessed/performed             Past Medical History:  Diagnosis Date   Anemia    when pt was younger   Arthritis    arthritis fingers   Asthma    Coronary artery calcification 04/24/2017   Diverticulitis    x2 -last 1'17 tx. outpatient.   Environmental and seasonal allergies    2014 Possibly related to air freshners.   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   Family history of anesthesia complication 03ESP ago   daughter had an allergic reaction to atropine   GERD (gastroesophageal reflux disease)    no meds   History of hiatal hernia 09/14/2014   seen on xray   History of kidney stones    '80's lithotripsy-[facility] x1   History of kidney stones    x1   Hyperlipidemia    Hypothyroidism    Pneumonia 2014   Pure hypercholesterolemia    Tremor of left hand     Past Surgical History:  Procedure Laterality Date   BALLOON DILATION N/A 05/04/2014   Procedure: BALLOON DILATION;  Surgeon: Garlan Fair, MD;  Location: [facility];  Service: Endoscopy;  Laterality: N/A;   BREAST LUMPECTOMY Right 1972   benign    BREAST SURGERY Right    '72 -benign tumor   childbirth- NVD x2      COLONOSCOPY WITH  PROPOFOL N/A 05/04/2014   Procedure: COLONOSCOPY WITH PROPOFOL;  Surgeon: Garlan Fair, MD;  Location: [facility];  Service: Endoscopy;  Laterality: N/A;   DILATION AND CURETTAGE OF UTERUS     04-20-14 [facility] hospital endometrial polyp removed.   ESOPHAGOGASTRODUODENOSCOPY (EGD) WITH PROPOFOL N/A 05/04/2014   Procedure: ESOPHAGOGASTRODUODENOSCOPY (EGD) WITH PROPOFOL;  Surgeon: Garlan Fair, MD;  [facility];  Service: Endoscopy;  Laterality: N/A;. Procedure done in [place], [place]- 5 yrs ago.   HYSTEROSCOPY WITH D & C N/A 04/20/2014   Procedure: DILATATION AND CURETTAGE /HYSTEROSCOPY;  Surgeon: [name], MD;  Location: [facility];  Service: Gynecology;  Laterality: N/A;   LITHOTRIPSY     POLYPECTOMY  2020   Dr. Lear Ng   TOTAL KNEE ARTHROPLASTY Left 02/15/2021   Procedure: LEFT TOTAL KNEE ARTHROPLASTY;  Surgeon: Mcarthur Rossetti, MD;  Location: [place];  Service: Orthopedics;  Laterality: Left;   TOTAL KNEE ARTHROPLASTY Right 08/09/2021   Procedure: RIGHT TOTAL KNEE ARTHROPLASTY;  Surgeon: Mcarthur Rossetti, MD;  Location: [place];  Service: Orthopedics;  Laterality: Right;  Needs RNFA   TUBAL LIGATION      There were no vitals filed for this visit.  09/12/21 1148  Symptoms/Limitations  Subjective Pt. indicated tightness in flexion still to start movement.  Pertinent History arthritis, Left TKA 02/15/21, HLD, asthma  Patient Stated Goals to walk trials and active in community  Pain Assessment  Currently in Pain? No/denies (no pain at rest)                      [facility] Adult PT Treatment/Exercise - 09/12/21 0001       Neuro Re-ed    Neuro Re-ed Details  fitter rocker board fwd/back 30x each, tandem ambulation fwd/back on airex foam 8 ft x 6 each way, lateral stepping 3 cones x 8 bilateral      Knee/Hip Exercises: Stretches   Gastroc Stretch 30 seconds;3 reps;Both   incilne board     Knee/Hip Exercises: Aerobic   Stationary  Bike Full revolution 7 mins seat 3      Knee/Hip Exercises: Machines for Strengthening   Cybex Knee Extension Double leg up, eccentric lowering on Rt 3 x 10 5 lbs      Knee/Hip Exercises: Standing   Lateral Step Up Right;10 reps;Hand Hold: 1;Step Height: 6"   cues for home use   Other Standing Knee Exercises flight of stairs in clinic c reciprocal gait pattern up/down Rt hand on rail going down, Lt on rail going up(similar to home)      Knee/Hip Exercises: Seated   Other Seated Knee/Hip Exercises seated SLR 2 x 10 on Rt      Vasopneumatic   Number Minutes Vasopneumatic  --   Pt. deferred today                      PT Short Term Goals - 08/31/21 1403       PT SHORT TERM GOAL #1   Title Pt will be independent in her initial HEP.    Time 4    Period Weeks    Status On-going    Target Date 09/23/21      PT SHORT TERM GOAL #2   Title Patient reports 50% improvement in sleep    Time 4    Period Weeks    Status On-going    Target Date 09/23/21      PT SHORT TERM GOAL #3   Title right knee PROM -5* ext to 100* flexion    Time 4    Period Weeks    Status On-going    Target Date 09/23/21      PT SHORT TERM GOAL #4   Title interum FOTO score improvement >62%    Time 4    Period Weeks    Status On-going    Target Date 09/23/21               PT Long Term Goals - 09/12/21 1214       PT LONG TERM GOAL #1   Title Pt will be independent in her advanced HEP.    Time 8    Period Weeks    Status On-going    Target Date 10/21/21      PT LONG TERM GOAL #2   Title pt reports no pain in knee with standing, gait & functional activities    Time 8    Period Weeks    Status On-going    Target Date 10/21/21      PT LONG TERM GOAL #3   Title Pt will improve her FOTO to >/= 65    Time 8  Period Weeks    Status On-going    Target Date 10/21/21      PT LONG TERM GOAL #4   Title Pt will improve her Single Leg Stance to >/= 15 seconds on right LE.    Time  8    Period Weeks    Status On-going    Target Date 10/21/21      PT LONG TERM GOAL #5   Title Pt will be able to ambulate 500', negotiate ramp & curb and climb a flight of stairs with step over step pattern with no UE support    Time 8    Period Weeks    Status On-going    Target Date 10/21/21      PT LONG TERM GOAL #6   Title right knee AROM -3* ext to 100* antigravity    Time 8    Period Weeks    Status On-going    Target Date 10/21/21                   Plan - 09/12/21 1213     Clinical Impression Statement Pt. to continue to benefit from progressive strengthening and balance intervention to promote improved single leg stance control, dynamic movement control for daily activity.    Comorbidities arthritis, Left TKA 02/15/21, HLD, asthma    PT Treatment/Interventions ADLs/Self Care Home Management;Cryotherapy;Moist Heat;DME Instruction;Gait training;Stair training;Functional mobility training;Therapeutic activities;Therapeutic exercise;Balance training;Neuromuscular re-education;Patient/family education;Manual techniques;Scar mobilization;Passive range of motion;Taping;Vasopneumatic Device;Joint Manipulations    PT Next Visit Plan Progressive quad strengthening c machine use, compliant and dynamic stability.    PT Home Exercise Plan Access Code: [email or web address]    Consulted and Agree with Plan of Care Patient             Patient will benefit from skilled therapeutic intervention in order to improve the following deficits and impairments:  Abnormal gait, Decreased activity tolerance, Decreased balance, Decreased endurance, Decreased mobility, Decreased range of motion, Decreased scar mobility, Decreased strength, Difficulty walking, Increased edema, Impaired flexibility, Obesity, Pain, Postural dysfunction  Visit Diagnosis: Acute pain of right knee  Stiffness of right knee, not elsewhere classified  Muscle weakness (generalized)  Other abnormalities of gait and  mobility  Localized edema  Unsteadiness on feet     Problem List Patient Active Problem List   Diagnosis Date Noted   DJD (degenerative joint disease) of knee 08/09/2021   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   Unilateral primary osteoarthritis, right knee 06/06/2021   Status post total left knee replacement 02/15/2021   Unilateral primary osteoarthritis, left knee 01/04/2021   Coronary artery calcification 04/24/2017   Hyperlipidemia 04/24/2017   Acute bronchitis 09/29/2014   SBO (small bowel obstruction) ([place]) 09/27/2014   Nausea and vomiting 09/27/2014   Hypothyroidism 09/27/2014   Leukocytosis 09/27/2014    Scot Jun, PT, DPT, OCS, ATC 09/12/21  12:26 PM    [facility] [address] [place], [place], [address] Phone: [phone]   Fax:  [phone]  Name: Katie [facility] MRN: [id number] Date of Birth: May 13, 1946

## 2021-09-14 ENCOUNTER — Encounter: Payer: Self-pay | Admitting: Rehabilitative and Restorative Service Providers"

## 2021-09-14 ENCOUNTER — Other Ambulatory Visit: Payer: Self-pay

## 2021-09-14 ENCOUNTER — Ambulatory Visit (INDEPENDENT_AMBULATORY_CARE_PROVIDER_SITE_OTHER): Payer: Medicare Other | Admitting: Rehabilitative and Restorative Service Providers"

## 2021-09-14 ENCOUNTER — Encounter: Payer: Medicare Other | Admitting: Physical Therapy

## 2021-09-14 DIAGNOSIS — R2681 Unsteadiness on feet: Secondary | ICD-10-CM

## 2021-09-14 DIAGNOSIS — M25561 Pain in right knee: Secondary | ICD-10-CM

## 2021-09-14 DIAGNOSIS — M25661 Stiffness of right knee, not elsewhere classified: Secondary | ICD-10-CM | POA: Diagnosis not present

## 2021-09-14 DIAGNOSIS — M6281 Muscle weakness (generalized): Secondary | ICD-10-CM

## 2021-09-14 DIAGNOSIS — R600 Localized edema: Secondary | ICD-10-CM

## 2021-09-14 DIAGNOSIS — R2689 Other abnormalities of gait and mobility: Secondary | ICD-10-CM | POA: Diagnosis not present

## 2021-09-14 NOTE — Therapy (Signed)
[facility] [address] [place], [place], [address] Phone: [phone]   Fax:  [phone]  Physical Therapy Treatment  Patient Details  Name: Katie [facility] MRN: [id number] Date of Birth: [birth year] Referring Provider (PT): Jean Rosenthal, MD   Encounter Date: 09/14/2021   PT End of Session - 09/14/21 1152     Visit Number 8    Number of Visits 18    Date for PT Re-Evaluation 10/21/21    Authorization Type Med A/B and generic comm secondary    Progress Note Due on Visit 10    PT Start Time 1141    PT Stop Time 1220    PT Time Calculation (min) 39 min    Activity Tolerance Patient tolerated treatment well    Behavior During Therapy [facility] for tasks assessed/performed             Past Medical History:  Diagnosis Date   Anemia    when pt was younger   Arthritis    arthritis fingers   Asthma    Coronary artery calcification 04/24/2017   Diverticulitis    x2 -last 1'17 tx. outpatient.   Environmental and seasonal allergies    2014 Possibly related to air freshners.   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   Family history of anesthesia complication 83TDV ago   daughter had an allergic reaction to atropine   GERD (gastroesophageal reflux disease)    no meds   History of hiatal hernia 09/14/2014   seen on xray   History of kidney stones    '80's lithotripsy-[facility] x1   History of kidney stones    x1   Hyperlipidemia    Hypothyroidism    Pneumonia 2014   Pure hypercholesterolemia    Tremor of left hand     Past Surgical History:  Procedure Laterality Date   BALLOON DILATION N/A 05/04/2014   Procedure: BALLOON DILATION;  Surgeon: Garlan Fair, MD;  [facility];  Service: Endoscopy;  Laterality: N/A;   BREAST LUMPECTOMY Right 1972   benign    BREAST SURGERY Right    '72 -benign tumor   childbirth- NVD x2      COLONOSCOPY WITH  PROPOFOL N/A 05/04/2014   Procedure: COLONOSCOPY WITH PROPOFOL;  Surgeon: Garlan Fair, MD;  [facility];  Service: Endoscopy;  Laterality: N/A;   DILATION AND CURETTAGE OF UTERUS     04-20-14 [facility] hospital endometrial polyp removed.   ESOPHAGOGASTRODUODENOSCOPY (EGD) WITH PROPOFOL N/A 05/04/2014   Procedure: ESOPHAGOGASTRODUODENOSCOPY (EGD) WITH PROPOFOL;  Surgeon: Garlan Fair, MD;  [facility];  Service: Endoscopy;  Laterality: N/A;. Procedure done in [place], [place]- 5 yrs ago.   HYSTEROSCOPY WITH D & C N/A 04/20/2014   Procedure: DILATATION AND CURETTAGE /HYSTEROSCOPY;  Surgeon: [name], MD;  Location: [facility];  Service: Gynecology;  Laterality: N/A;   LITHOTRIPSY     POLYPECTOMY  2020   Dr. Lear Ng   TOTAL KNEE ARTHROPLASTY Left 02/15/2021   Procedure: LEFT TOTAL KNEE ARTHROPLASTY;  Surgeon: Mcarthur Rossetti, MD;  Location: [place];  Service: Orthopedics;  Laterality: Left;   TOTAL KNEE ARTHROPLASTY Right 08/09/2021   Procedure: RIGHT TOTAL KNEE ARTHROPLASTY;  Surgeon: Mcarthur Rossetti, MD;  Location: [place];  Service: Orthopedics;  Laterality: Right;  Needs RNFA   TUBAL LIGATION      There were no vitals filed for this visit.  Subjective Assessment - 09/14/21 1146     Subjective Pt. indicated feeling medial knee pain still there at times.  Tightness c first bending.  Pt. indicated she was able to enter up the stairs today.    Pertinent History arthritis, Left TKA 02/15/21, HLD, asthma    Patient Stated Goals to walk trials and active in community    Currently in Pain? Yes    Pain Score 8    8/10 at [facility]   Pain Location Knee    Pain Orientation Right;Medial;Anterior    Pain Descriptors / Indicators Aching;Sharp    Pain Type Surgical pain    Pain Onset More than a month ago    Pain Frequency Intermittent    Aggravating Factors  first bending    Pain Relieving Factors moving around.                                [place] Adult PT Treatment/Exercise - 09/14/21 0001       Neuro Re-ed    Neuro Re-ed Details  lateral stepping 3 cones x 8 each bilateral, single leg stance c cone touching contralateral leg (anterior, anteroir/medial, anterior/lateral) x 8 each bilateral      Knee/Hip Exercises: Stretches   Gastroc Stretch 30 seconds;3 reps;Both      Knee/Hip Exercises: Aerobic   Stationary Bike Full revolution seat 3 lvl 2 10 mins      Knee/Hip Exercises: Machines for Strengthening   Cybex Knee Extension Double leg up, eccentric lowering on Rt 3 x 10 10 lbs    Cybex Leg Press Rt leg only 100 lbs 2 x 10      Knee/Hip Exercises: Standing   Lateral Step Up Right;Step Height: 6"   2 x 15 c blue band TKE     Knee/Hip Exercises: Seated   Other Seated Knee/Hip Exercises seated SLR 2 x 10 on Rt                       PT Short Term Goals - 08/31/21 1403       PT SHORT TERM GOAL #1   Title Pt will be independent in her initial HEP.    Time 4    Period Weeks    Status On-going    Target Date 09/23/21      PT SHORT TERM GOAL #2   Title Patient reports 50% improvement in sleep    Time 4    Period Weeks    Status On-going    Target Date 09/23/21      PT SHORT TERM GOAL #3   Title right knee PROM -5* ext to 100* flexion    Time 4    Period Weeks    Status On-going    Target Date 09/23/21      PT SHORT TERM GOAL #4   Title interum FOTO score improvement >62%    Time 4    Period Weeks    Status On-going    Target Date 09/23/21               PT Long Term Goals - 09/12/21 1214       PT LONG TERM GOAL #1   Title Pt will be independent in her advanced HEP.    Time 8    Period Weeks    Status On-going    Target Date 10/21/21      PT LONG TERM  GOAL #2   Title pt reports no pain in knee with standing, gait & functional activities    Time 8    Period Weeks    Status On-going    Target Date 10/21/21      PT LONG TERM GOAL #3    Title Pt will improve her FOTO to >/= 65    Time 8    Period Weeks    Status On-going    Target Date 10/21/21      PT LONG TERM GOAL #4   Title Pt will improve her Single Leg Stance to >/= 15 seconds on right LE.    Time 8    Period Weeks    Status On-going    Target Date 10/21/21      PT LONG TERM GOAL #5   Title Pt will be able to ambulate 500', negotiate ramp & curb and climb a flight of stairs with step over step pattern with no UE support    Time 8    Period Weeks    Status On-going    Target Date 10/21/21      PT LONG TERM GOAL #6   Title right knee AROM -3* ext to 100* antigravity    Time 8    Period Weeks    Status On-going    Target Date 10/21/21                   Plan - 09/14/21 1215     Clinical Impression Statement Noted improvement in reciprocal gait control on stair navigation today.  Dynamic and compliant surfance balance to continue to improve c skilled PT visits.    Comorbidities arthritis, Left TKA 02/15/21, HLD, asthma    Examination-Activity Limitations Carry;Locomotion Level;Sleep;Squat;Stairs;Stand;Transfers    Examination-Participation Restrictions Community Activity    Rehab Potential Good    PT Treatment/Interventions ADLs/Self Care Home Management;Cryotherapy;Moist Heat;DME Instruction;Gait training;Stair training;Functional mobility training;Therapeutic activities;Therapeutic exercise;Balance training;Neuromuscular re-education;Patient/family education;Manual techniques;Scar mobilization;Passive range of motion;Taping;Vasopneumatic Device;Joint Manipulations    PT Next Visit Plan Static and dynamic balance interventions.    PT Home Exercise Plan Access Code: [email or web address]    Consulted and Agree with Plan of Care Patient             Patient will benefit from skilled therapeutic intervention in order to improve the following deficits and impairments:  Abnormal gait, Decreased activity tolerance, Decreased balance, Decreased endurance,  Decreased mobility, Decreased range of motion, Decreased scar mobility, Decreased strength, Difficulty walking, Increased edema, Impaired flexibility, Obesity, Pain, Postural dysfunction  Visit Diagnosis: Acute pain of right knee  Stiffness of right knee, not elsewhere classified  Muscle weakness (generalized)  Other abnormalities of gait and mobility  Localized edema  Unsteadiness on feet     Problem List Patient Active Problem List   Diagnosis Date Noted   DJD (degenerative joint disease) of knee 08/09/2021   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   Unilateral primary osteoarthritis, right knee 06/06/2021   Status post total left knee replacement 02/15/2021   Unilateral primary osteoarthritis, left knee 01/04/2021   Coronary artery calcification 04/24/2017   Hyperlipidemia 04/24/2017   Acute bronchitis 09/29/2014   SBO (small bowel obstruction) ([place]) 09/27/2014   Nausea and vomiting 09/27/2014   Hypothyroidism 09/27/2014   Leukocytosis 09/27/2014    Girtha Rm, PT 09/14/2021, 12:19 PM  [facility] [address] [place], [place], [address] Phone: [phone]   Fax:  [phone]  Name: Kaliegh Willadsen MRN: [id number] Date of  Birth: July 10, 1946

## 2021-09-15 ENCOUNTER — Encounter: Payer: Self-pay | Admitting: Physical Therapy

## 2021-09-15 ENCOUNTER — Ambulatory Visit (INDEPENDENT_AMBULATORY_CARE_PROVIDER_SITE_OTHER): Payer: Medicare Other | Admitting: Physical Therapy

## 2021-09-15 DIAGNOSIS — M25561 Pain in right knee: Secondary | ICD-10-CM | POA: Diagnosis not present

## 2021-09-15 DIAGNOSIS — M25661 Stiffness of right knee, not elsewhere classified: Secondary | ICD-10-CM | POA: Diagnosis not present

## 2021-09-15 DIAGNOSIS — M6281 Muscle weakness (generalized): Secondary | ICD-10-CM | POA: Diagnosis not present

## 2021-09-15 DIAGNOSIS — R2681 Unsteadiness on feet: Secondary | ICD-10-CM

## 2021-09-15 DIAGNOSIS — R2689 Other abnormalities of gait and mobility: Secondary | ICD-10-CM

## 2021-09-15 DIAGNOSIS — R600 Localized edema: Secondary | ICD-10-CM

## 2021-09-15 NOTE — Patient Instructions (Signed)
Walking program  Start with 5 min walk with cane, 5 min rest, building up to 4 sets Then decrease cane use Then decrease rest time until can walk 20 min without rest.

## 2021-09-15 NOTE — Therapy (Signed)
[facility] [address] [place], [place], [address] Phone: [phone]   Fax:  [phone]  Physical Therapy Treatment  Patient Details  Name: Katie Weber MRN: [id number] Date of Birth: [birth year] Referring Provider (PT): Jean Rosenthal, MD   Encounter Date: 09/15/2021   PT End of Session - 09/15/21 1147     Visit Number 9    Number of Visits 18    Date for PT Re-Evaluation 10/21/21    Authorization Type Med A/B and generic comm secondary    Progress Note Due on Visit 10    PT Start Time 1145    PT Stop Time 1230    PT Time Calculation (min) 45 min    Activity Tolerance Patient tolerated treatment well    Behavior During Therapy [facility] for tasks assessed/performed             Past Medical History:  Diagnosis Date   Anemia    when pt was younger   Arthritis    arthritis fingers   Asthma    Coronary artery calcification 04/24/2017   Diverticulitis    x2 -last 1'17 tx. outpatient.   Environmental and seasonal allergies    2014 Possibly related to air freshners.   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   Family history of anesthesia complication 12INO ago   daughter had an allergic reaction to atropine   GERD (gastroesophageal reflux disease)    no meds   History of hiatal hernia 09/14/2014   seen on xray   History of kidney stones    '80's lithotripsy-[facility] x1   History of kidney stones    x1   Hyperlipidemia    Hypothyroidism    Pneumonia 2014   Pure hypercholesterolemia    Tremor of left hand     Past Surgical History:  Procedure Laterality Date   BALLOON DILATION N/A 05/04/2014   Procedure: BALLOON DILATION;  Surgeon: Garlan Fair, MD;  Location: [facility];  Service: Endoscopy;  Laterality: N/A;   BREAST LUMPECTOMY Right 1972   benign    BREAST SURGERY Right    '72 -benign tumor   childbirth- NVD x2      COLONOSCOPY WITH  PROPOFOL N/A 05/04/2014   Procedure: COLONOSCOPY WITH PROPOFOL;  Surgeon: Garlan Fair, MD;  Location: [facility];  Service: Endoscopy;  Laterality: N/A;   DILATION AND CURETTAGE OF UTERUS     04-20-14 [facility] [place] [facility] endometrial polyp removed.   ESOPHAGOGASTRODUODENOSCOPY (EGD) WITH PROPOFOL N/A 05/04/2014   Procedure: ESOPHAGOGASTRODUODENOSCOPY (EGD) WITH PROPOFOL;  Surgeon: Garlan Fair, MD;  [facility]: [facility];  Service: Endoscopy;  Laterality: N/A;. Procedure done in [place], [place]- 5 yrs ago.   HYSTEROSCOPY WITH D & C N/A 04/20/2014   Procedure: DILATATION AND CURETTAGE /HYSTEROSCOPY;  Surgeon: [name], MD;  Location: [facility];  Service: Gynecology;  Laterality: N/A;   LITHOTRIPSY     POLYPECTOMY  2020   Dr. Lear Ng   TOTAL KNEE ARTHROPLASTY Left 02/15/2021   Procedure: LEFT TOTAL KNEE ARTHROPLASTY;  Surgeon: Mcarthur Rossetti, MD;  Location: [place];  Service: Orthopedics;  Laterality: Left;   TOTAL KNEE ARTHROPLASTY Right 08/09/2021   Procedure: RIGHT TOTAL KNEE ARTHROPLASTY;  Surgeon: Mcarthur Rossetti, MD;  Location: [place];  Service: Orthopedics;  Laterality: Right;  Needs RNFA   TUBAL LIGATION      There were no vitals filed for this visit.  Subjective Assessment - 09/15/21 1145     Subjective The increased weight on machines and she got out of car quickly twisting her knee have increased pain.    Pertinent History arthritis, Left TKA 02/15/21, HLD, asthma    Patient Stated Goals to walk trials and active in community    Currently in Pain? Yes    Pain Score 7    since yesterday PT, lowest 3-4/10 and highest 9-10/10   Pain Location Knee    Pain Orientation Right;Medial;Lateral    Pain Descriptors / Indicators Aching;Throbbing;Sharp    Pain Type Surgical pain    Pain Onset More than a month ago    Pain Frequency Intermittent    Aggravating Factors  getting out of car too quickly yesterday and increased weight    Pain Relieving Factors  moving knee, took pain medication prior to PT today    Effect of Pain on Daily Activities walking                               [facility] Adult PT Treatment/Exercise - 09/15/21 1145       Neuro Re-ed    Neuro Re-ed Details  lateral stepping 3 cones x 8 each bilateral, single leg stance on foam c cone touching contralateral leg (anterior, anteroir/medial, anterior/lateral) x 8 each bilateral      Knee/Hip Exercises: Stretches   Gastroc Stretch 30 seconds;3 reps;Both    Gastroc Stretch Limitations slant board      Knee/Hip Exercises: Aerobic   Recumbent Bike Sci Fit Seat 6 level 2 with LEs only 10 min      Knee/Hip Exercises: Machines for Strengthening   Cybex Knee Extension Double leg up, eccentric lowering on Rt 3sets x 10reps 5 lbs    Cybex Leg Press Rt leg only 87 lbs 10reps, 2 sets w/back 45*, 2 sets w/back flat, alt reps with DF of ankle      Knee/Hip Exercises: Standing   Lateral Step Up Right;Step Height: 6"   2 x 15 c blue band TKE     Knee/Hip Exercises: Seated   Other Seated Knee/Hip Exercises --                     PT Education - 09/15/21 1258     Education Details progressive walking program    Person(s) Educated Patient    Methods Explanation;Verbal cues              PT Short Term Goals - 08/31/21 1403       PT SHORT TERM GOAL #1   Title Pt will be independent in her initial HEP.    Time 4    Period Weeks    Status On-going    Target Date 09/23/21      PT SHORT TERM GOAL #2   Title Patient reports 50% improvement in sleep    Time 4    Period Weeks    Status On-going    Target Date 09/23/21      PT SHORT TERM GOAL #3   Title right knee PROM -5* ext to 100* flexion    Time 4    Period Weeks    Status On-going    Target Date 09/23/21      PT SHORT TERM GOAL #4   Title interum FOTO score improvement >62%    Time 4    Period Weeks    Status On-going  Target Date 09/23/21               PT Long Term  Goals - 09/12/21 1214       PT LONG TERM GOAL #1   Title Pt will be independent in her advanced HEP.    Time 8    Period Weeks    Status On-going    Target Date 10/21/21      PT LONG TERM GOAL #2   Title pt reports no pain in knee with standing, gait & functional activities    Time 8    Period Weeks    Status On-going    Target Date 10/21/21      PT LONG TERM GOAL #3   Title Pt will improve her FOTO to >/= 65    Time 8    Period Weeks    Status On-going    Target Date 10/21/21      PT LONG TERM GOAL #4   Title Pt will improve her Single Leg Stance to >/= 15 seconds on right LE.    Time 8    Period Weeks    Status On-going    Target Date 10/21/21      PT LONG TERM GOAL #5   Title Pt will be able to ambulate 500', negotiate ramp & curb and climb a flight of stairs with step over step pattern with no UE support    Time 8    Period Weeks    Status On-going    Target Date 10/21/21      PT LONG TERM GOAL #6   Title right knee AROM -3* ext to 100* antigravity    Time 8    Period Weeks    Status On-going    Target Date 10/21/21                   Plan - 09/15/21 1148     Clinical Impression Statement Patient's knee had increased pain but she could not determine if from increased weight in PT or how she got out of car.  She did report PT exercises today seemed to calm down the pain.  PT educated on progressive walking program per her request and she seems to understand.  Pt continues to benefit from skilled PT.    Comorbidities arthritis, Left TKA 02/15/21, HLD, asthma    Examination-Activity Limitations Carry;Locomotion Level;Sleep;Squat;Stairs;Stand;Transfers    Examination-Participation Restrictions Community Activity    Rehab Potential Good    PT Treatment/Interventions ADLs/Self Care Home Management;Cryotherapy;Moist Heat;DME Instruction;Gait training;Stair training;Functional mobility training;Therapeutic activities;Therapeutic exercise;Balance  training;Neuromuscular re-education;Patient/family education;Manual techniques;Scar mobilization;Passive range of motion;Taping;Vasopneumatic Device;Joint Manipulations    PT Next Visit Plan asses STGs and send progress note to Dr. [name],  manual techiques & exercises for range & functional strength, Static and dynamic balance interventions.    PT Home Exercise Plan Access Code: [email or web address]    Consulted and Agree with Plan of Care Patient             Patient will benefit from skilled therapeutic intervention in order to improve the following deficits and impairments:  Abnormal gait, Decreased activity tolerance, Decreased balance, Decreased endurance, Decreased mobility, Decreased range of motion, Decreased scar mobility, Decreased strength, Difficulty walking, Increased edema, Impaired flexibility, Obesity, Pain, Postural dysfunction  Visit Diagnosis: Acute pain of right knee  Stiffness of right knee, not elsewhere classified  Muscle weakness (generalized)  Other abnormalities of gait and mobility  Localized edema  Unsteadiness on feet  Problem List Patient Active Problem List   Diagnosis Date Noted   DJD (degenerative joint disease) of knee 08/09/2021   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   Unilateral primary osteoarthritis, right knee 06/06/2021   Status post total left knee replacement 02/15/2021   Unilateral primary osteoarthritis, left knee 01/04/2021   Coronary artery calcification 04/24/2017   Hyperlipidemia 04/24/2017   Acute bronchitis 09/29/2014   SBO (small bowel obstruction) ([place]) 09/27/2014   Nausea and vomiting 09/27/2014   Hypothyroidism 09/27/2014   Leukocytosis 09/27/2014    Jamey Reas, PT, DPT 09/15/2021, 1:55 PM  [facility] [address] [place], [place], [address] Phone: [phone]   Fax:  [phone]  Name: Katie Weber MRN: [id number] Date of Birth: [birth year]

## 2021-09-19 ENCOUNTER — Encounter: Payer: Self-pay | Admitting: Rehabilitative and Restorative Service Providers"

## 2021-09-19 ENCOUNTER — Encounter: Payer: Medicare Other | Admitting: Physical Therapy

## 2021-09-19 ENCOUNTER — Ambulatory Visit (INDEPENDENT_AMBULATORY_CARE_PROVIDER_SITE_OTHER): Payer: Medicare Other | Admitting: Rehabilitative and Restorative Service Providers"

## 2021-09-19 ENCOUNTER — Other Ambulatory Visit: Payer: Self-pay

## 2021-09-19 DIAGNOSIS — R2689 Other abnormalities of gait and mobility: Secondary | ICD-10-CM

## 2021-09-19 DIAGNOSIS — M25561 Pain in right knee: Secondary | ICD-10-CM

## 2021-09-19 DIAGNOSIS — M25661 Stiffness of right knee, not elsewhere classified: Secondary | ICD-10-CM

## 2021-09-19 DIAGNOSIS — M6281 Muscle weakness (generalized): Secondary | ICD-10-CM

## 2021-09-19 DIAGNOSIS — R2681 Unsteadiness on feet: Secondary | ICD-10-CM

## 2021-09-19 DIAGNOSIS — R600 Localized edema: Secondary | ICD-10-CM

## 2021-09-19 NOTE — Therapy (Signed)
[facility] [address] [place], [place], [address] Phone: [phone]   Fax:  [phone]  Physical Therapy Treatment/Progress Note  Patient Details  Name: Katie Weber MRN: [id number] Date of Birth: 1946-07-01 Referring Provider (PT): Jean Rosenthal, MD   Encounter Date: 09/19/2021  Progress Note Reporting Period 08/29/2021 to 09/19/2021  See note below for Objective Data and Assessment of Progress/Goals.       PT End of Session - 09/19/21 1147     Visit Number 10    Number of Visits 18    Date for PT Re-Evaluation 10/21/21    Authorization Type Med A/B and generic comm secondary    Progress Note Due on Visit 20    PT Start Time 1143    PT Stop Time 1222    PT Time Calculation (min) 39 min    Activity Tolerance Patient tolerated treatment well    Behavior During Therapy [facility] for tasks assessed/performed             Past Medical History:  Diagnosis Date   Anemia    when pt was younger   Arthritis    arthritis fingers   Asthma    Coronary artery calcification 04/24/2017   Diverticulitis    x2 -last 1'17 tx. outpatient.   Environmental and seasonal allergies    2014 Possibly related to air freshners.   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   Family history of anesthesia complication 01EOF ago   daughter had an allergic reaction to atropine   GERD (gastroesophageal reflux disease)    no meds   History of hiatal hernia 09/14/2014   seen on xray   History of kidney stones    '80's lithotripsy-[facility] x1   History of kidney stones    x1   Hyperlipidemia    Hypothyroidism    Pneumonia 2014   Pure hypercholesterolemia    Tremor of left hand     Past Surgical History:  Procedure Laterality Date   BALLOON DILATION N/A 05/04/2014   Procedure: BALLOON DILATION;  Surgeon: Garlan Fair, MD;  [facility];  Service: Endoscopy;   Laterality: N/A;   BREAST LUMPECTOMY Right 1972   benign    BREAST SURGERY Right    '72 -benign tumor   childbirth- NVD x2      COLONOSCOPY WITH PROPOFOL N/A 05/04/2014   Procedure: COLONOSCOPY WITH PROPOFOL;  Surgeon: Garlan Fair, MD;  [facility];  Service: Endoscopy;  Laterality: N/A;   DILATION AND CURETTAGE OF UTERUS     04-20-14 [facility] hospital endometrial polyp removed.   ESOPHAGOGASTRODUODENOSCOPY (EGD) WITH PROPOFOL N/A 05/04/2014   Procedure: ESOPHAGOGASTRODUODENOSCOPY (EGD) WITH PROPOFOL;  Surgeon: Garlan Fair, MD;  [facility];  Service: Endoscopy;  Laterality: N/A;. Procedure done in [place], [place]- 5 yrs ago.   HYSTEROSCOPY WITH D & C N/A 04/20/2014   Procedure: DILATATION AND CURETTAGE /HYSTEROSCOPY;  Surgeon: [name], MD;  Location: [facility];  Service: Gynecology;  Laterality: N/A;   LITHOTRIPSY     POLYPECTOMY  2020   Dr. Lear Ng   TOTAL KNEE ARTHROPLASTY Left 02/15/2021   Procedure: LEFT TOTAL KNEE ARTHROPLASTY;  Surgeon: Mcarthur Rossetti, MD;  Location: [place];  Service: Orthopedics;  Laterality: Left;   TOTAL KNEE ARTHROPLASTY Right 08/09/2021   Procedure: RIGHT TOTAL KNEE ARTHROPLASTY;  Surgeon: Mcarthur Rossetti, MD;  Location: [place];  Service: Orthopedics;  Laterality: Right;  Needs RNFA   TUBAL LIGATION      There were no vitals filed for this visit.   Subjective Assessment - 09/19/21 1144     Subjective Pt. indicated feeling pain at worst under 5/10.  Still noticed complaints c nighttime and also with prolonged sitting, initial bending.  Global rating of change +7 a very great deal better.    Pertinent History arthritis, Left TKA 02/15/21, HLD, asthma    Patient Stated Goals to walk trials and active in community    Currently in Pain? Yes    Pain Score 5    at worst   Pain Location Knee    Pain Orientation Right;Anterior;Medial    Pain Descriptors / Indicators Aching;Throbbing;Sore    Pain Onset More  than a month ago    Pain Frequency Intermittent    Aggravating Factors  nighttime, first bending    Pain Relieving Factors progressive movement.                [facility] PT Assessment - 09/19/21 0001       Assessment   Medical Diagnosis Z96.651 (ICD-10-CM) - Status post total right knee replacement    Referring Provider (PT) Jean Rosenthal, MD    Onset Date/Surgical Date 08/09/21    Hand Dominance Right      Observation/Other Assessments   Focus on Therapeutic Outcomes (FOTO)  update 65%      Single Leg Stance   Comments Rt SLS : 23 seconds.  Lt SLS: 9 seconds      PROM   Right Knee Extension -4   in LAQ   Right Knee Flexion 123   in supine heel slide     Strength   Right Knee Extension 5/5   42.8, 45 lbs   Left Knee Extension 5/5   49, 46 lbs     Ambulation/Gait   Gait Comments Independent ambulation                           [facility] Adult PT Treatment/Exercise - 09/19/21 0001       Neuro Re-ed    Neuro Re-ed Details  lateral stepping 3 cones x 6 bilateral, tandem stance on foam 1 min x 1 bilateral, lateral stepping over cone on side x 4 cones x 5 each direction      Knee/Hip Exercises: Aerobic   Stationary Bike Full revolutions seat 3 lvl 2 10 mins      Knee/Hip Exercises: Machines for Strengthening   Cybex Knee Extension Double leg up, eccentric lowering on Rt 3 sets x 10reps 10 lbs      Knee/Hip Exercises: Standing   Other Standing Knee Exercises decline on ramp free squat 2 x 10 (cues to mimic chair transfer motion)    Other Standing Knee Exercises flight of stairs down c focus on reciprocal gait pattern c single hand light pressure on rail      Knee/Hip Exercises: Supine   Other Supine Knee/Hip Exercises Rt leg heel slide x 10                       PT Short Term Goals - 08/31/21 1403       PT SHORT TERM GOAL #1   Title Pt will be independent in her initial HEP.    Time 4    Period Weeks    Status On-going     Target Date 09/23/21  PT SHORT TERM GOAL #2   Title Patient reports 50% improvement in sleep    Time 4    Period Weeks    Status On-going    Target Date 09/23/21      PT SHORT TERM GOAL #3   Title right knee PROM -5* ext to 100* flexion    Time 4    Period Weeks    Status On-going    Target Date 09/23/21      PT SHORT TERM GOAL #4   Title interum FOTO score improvement >62%    Time 4    Period Weeks    Status On-going    Target Date 09/23/21               PT Long Term Goals - 09/19/21 1152       PT LONG TERM GOAL #1   Title Pt will be independent in her advanced HEP.    Time 8    Period Weeks    Status On-going    Target Date 10/21/21      PT LONG TERM GOAL #2   Title pt reports no pain in knee with standing, gait & functional activities    Time 8    Period Weeks    Status On-going    Target Date 10/21/21      PT LONG TERM GOAL #3   Title Pt will improve her FOTO to >/= 65    Time 8    Period Weeks    Status Achieved    Target Date 10/21/21      PT LONG TERM GOAL #4   Title Pt will improve her Single Leg Stance to >/= 15 seconds on right LE.    Time 8    Period Weeks    Status On-going    Target Date 10/21/21      PT LONG TERM GOAL #5   Title Pt will be able to ambulate 500', negotiate ramp & curb and climb a flight of stairs with step over step pattern with no UE support    Baseline update 09/19/2021 : stairs require hand assist for reciprocal gait    Time 8    Period Weeks    Status Partially Met    Target Date 10/21/21      PT LONG TERM GOAL #6   Title right knee AROM -3* ext to 100* antigravity    Time 8    Period Weeks    Status On-going    Target Date 10/21/21                   Plan - 09/19/21 1153     Clinical Impression Statement Pt. has attended 10 visits overall during course of treatment, reporting global rating of change +7 at this time.  See objective data for updated information showing gains towards goals at  this time.  Pt. has demonstrated good gains as noted but does still demonstrate indications of benefit from continued skilled PT services to continue to reach all established goals.    Comorbidities arthritis, Left TKA 02/15/21, HLD, asthma    Examination-Activity Limitations Carry;Locomotion Level;Sleep;Squat;Stairs;Stand;Transfers    Examination-Participation Restrictions Community Activity    Rehab Potential Good    PT Treatment/Interventions ADLs/Self Care Home Management;Cryotherapy;Moist Heat;DME Instruction;Gait training;Stair training;Functional mobility training;Therapeutic activities;Therapeutic exercise;Balance training;Neuromuscular re-education;Patient/family education;Manual techniques;Scar mobilization;Passive range of motion;Taping;Vasopneumatic Device;Joint Manipulations    PT Next Visit Plan Progressive strengthening and balance improvement intervention.    PT Home Exercise Plan  Access Code: [place] and Agree with Plan of Care Patient             Patient will benefit from skilled therapeutic intervention in order to improve the following deficits and impairments:  Abnormal gait, Decreased activity tolerance, Decreased balance, Decreased endurance, Decreased mobility, Decreased range of motion, Decreased scar mobility, Decreased strength, Difficulty walking, Increased edema, Impaired flexibility, Obesity, Pain, Postural dysfunction  Visit Diagnosis: Acute pain of right knee  Stiffness of right knee, not elsewhere classified  Muscle weakness (generalized)  Other abnormalities of gait and mobility  Localized edema  Unsteadiness on feet     Problem List Patient Active Problem List   Diagnosis Date Noted   DJD (degenerative joint disease) of knee 08/09/2021   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   Unilateral primary osteoarthritis, right knee 06/06/2021   Status post total left knee replacement 02/15/2021   Unilateral primary  osteoarthritis, left knee 01/04/2021   Coronary artery calcification 04/24/2017   Hyperlipidemia 04/24/2017   Acute bronchitis 09/29/2014   SBO (small bowel obstruction) ([place]) 09/27/2014   Nausea and vomiting 09/27/2014   Hypothyroidism 09/27/2014   Leukocytosis 09/27/2014   Scot Jun, PT, DPT, OCS, ATC 09/19/21  12:28 PM    [facility] [address] [place], [place], [address] Phone: [phone]   Fax:  [phone]  Name: Ilissa Rosner MRN: [id number] Date of Birth: April 18, 1946

## 2021-09-20 ENCOUNTER — Encounter: Payer: Self-pay | Admitting: Orthopaedic Surgery

## 2021-09-20 ENCOUNTER — Ambulatory Visit (INDEPENDENT_AMBULATORY_CARE_PROVIDER_SITE_OTHER): Payer: Medicare Other | Admitting: Orthopaedic Surgery

## 2021-09-20 DIAGNOSIS — Z96651 Presence of right artificial knee joint: Secondary | ICD-10-CM

## 2021-09-20 NOTE — Progress Notes (Signed)
The patient is 6 weeks today status post a right total knee arthroplasty.  We replaced her left knee earlier this year.  She is doing great and is already surpassed what she did with her other knee and is way ahead from a therapy standpoint overall.  Both knees are nice and straight when she stands.  She is very pleased that her knees are not bowed.  Her more recent right operative knee has full extension and full flexion and it feels ligamentously stable.  There is only some slight swelling and warmth.  Her left knee is doing great overall as well.  From my standpoint she can start slowing down from a therapy standpoint but I agree with her continuing therapy into the next month.  From my standpoint, I do not need to see her for 6 months unless she is having issues.  We will have an AP and lateral of both knees at that visit.  All questions and concerns were answered and addressed.

## 2021-09-21 ENCOUNTER — Ambulatory Visit (INDEPENDENT_AMBULATORY_CARE_PROVIDER_SITE_OTHER): Payer: Medicare Other | Admitting: Physical Therapy

## 2021-09-21 ENCOUNTER — Other Ambulatory Visit: Payer: Self-pay

## 2021-09-21 DIAGNOSIS — M25661 Stiffness of right knee, not elsewhere classified: Secondary | ICD-10-CM | POA: Diagnosis not present

## 2021-09-21 DIAGNOSIS — R2689 Other abnormalities of gait and mobility: Secondary | ICD-10-CM

## 2021-09-21 DIAGNOSIS — M25561 Pain in right knee: Secondary | ICD-10-CM

## 2021-09-21 DIAGNOSIS — R2681 Unsteadiness on feet: Secondary | ICD-10-CM

## 2021-09-21 DIAGNOSIS — M6281 Muscle weakness (generalized): Secondary | ICD-10-CM | POA: Diagnosis not present

## 2021-09-21 DIAGNOSIS — R600 Localized edema: Secondary | ICD-10-CM

## 2021-09-21 NOTE — Therapy (Signed)
[facility] Physical Therapy [address] [place], [place], [address] Phone: [phone]   Fax:  [phone]  Physical Therapy Treatment  Patient Details  Name: Katie Weber MRN: [id number] Date of Birth: Dec 12, 1945 Referring Provider (PT): Jean Rosenthal, MD   Encounter Date: 09/21/2021   PT End of Session - 09/21/21 1053     Visit Number 11    Number of Visits 18    Date for PT Re-Evaluation 10/21/21    Authorization Type Med A/B and generic comm secondary    Progress Note Due on Visit 20    PT Start Time 1053    PT Stop Time 1143    PT Time Calculation (min) 50 min    Activity Tolerance Patient tolerated treatment well    Behavior During Therapy [employer] for tasks assessed/performed             Past Medical History:  Diagnosis Date   Anemia    when pt was younger   Arthritis    arthritis fingers   Asthma    Coronary artery calcification 04/24/2017   Diverticulitis    x2 -last 1'17 tx. outpatient.   Environmental and seasonal allergies    2014 Possibly related to air freshners.   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   Family history of anesthesia complication 75FFM ago   daughter had an allergic reaction to atropine   GERD (gastroesophageal reflux disease)    no meds   History of hiatal hernia 09/14/2014   seen on xray   History of kidney stones    '80's lithotripsy-[facility] x1   History of kidney stones    x1   Hyperlipidemia    Hypothyroidism    Pneumonia 2014   Pure hypercholesterolemia    Tremor of left hand     Past Surgical History:  Procedure Laterality Date   BALLOON DILATION N/A 05/04/2014   Procedure: BALLOON DILATION;  Surgeon: Garlan Fair, MD;  Location: [facility];  Service: Endoscopy;  Laterality: N/A;   BREAST LUMPECTOMY Right 1972   benign    BREAST SURGERY Right    '72 -benign tumor   childbirth- NVD x2      COLONOSCOPY WITH  PROPOFOL N/A 05/04/2014   Procedure: COLONOSCOPY WITH PROPOFOL;  Surgeon: Garlan Fair, MD;  Location: [facility];  Service: Endoscopy;  Laterality: N/A;   DILATION AND CURETTAGE OF UTERUS     04-20-14 [facility] hospital endometrial polyp removed.   ESOPHAGOGASTRODUODENOSCOPY (EGD) WITH PROPOFOL N/A 05/04/2014   Procedure: ESOPHAGOGASTRODUODENOSCOPY (EGD) WITH PROPOFOL;  Surgeon: Garlan Fair, MD;  Location: [facility];  Service: Endoscopy;  Laterality: N/A;. Procedure done in [place], [place]- 5 yrs ago.   HYSTEROSCOPY WITH D & C N/A 04/20/2014   Procedure: DILATATION AND CURETTAGE /HYSTEROSCOPY;  Surgeon: [name], MD;  Location: [facility];  Service: Gynecology;  Laterality: N/A;   LITHOTRIPSY     POLYPECTOMY  2020   Dr. Lear Ng   TOTAL KNEE ARTHROPLASTY Left 02/15/2021   Procedure: LEFT TOTAL KNEE ARTHROPLASTY;  Surgeon: Mcarthur Rossetti, MD;  Location: [place];  Service: Orthopedics;  Laterality: Left;   TOTAL KNEE ARTHROPLASTY Right 08/09/2021   Procedure: RIGHT TOTAL KNEE ARTHROPLASTY;  Surgeon: Mcarthur Rossetti, MD;  Location: [facility];  Service: Orthopedics;  Laterality: Right;  Needs RNFA   TUBAL LIGATION      There were no vitals filed for this visit.  Subjective Assessment - 09/21/21 1054     Subjective She saw Dr. [name] who was well pleased with her progress.  Her knee is no longer waking her up at night.    Pertinent History arthritis, Left TKA 02/15/21, HLD, asthma    Patient Stated Goals to walk trials and active in community    Currently in Pain? No/denies    Pain Onset More than a month ago                               [facility] Adult PT Treatment/Exercise - 09/21/21 1054       Ambulation/Gait   Gait Comments Independent ambulation      Neuro Re-ed    Neuro Re-ed Details  stance limb on foam lateral stepping 3 cones x 6 bilateral, In //bars with BUE support standing on AirEx Y-reaches 5 reps on ea LE alt LEs .  tandem  stance on foam 1 min x 1 bilateral added simple cognitive task, lateral stepping over cone on side x 4 cones x 5 each direction      Knee/Hip Exercises: Aerobic   Stationary Bike Full revolutions seat 3 lvl 3 10 mins      Knee/Hip Exercises: Machines for Strengthening   Cybex Knee Extension Double leg up, eccentric lowering with one leg alternating LEs 2 sets  10reps ea LE with 10 lbs   PT educated on set up if she does this at a gym.  Pt verbalized understanding.   Cybex Knee Flexion BLEs 25# 10 reps 2sets      Knee/Hip Exercises: Standing   Other Standing Knee Exercises decline on ramp free squat 2 x 10 (cues to mimic chair transfer motion)    Other Standing Knee Exercises flight of stairs down c focus on reciprocal gait pattern c single hand light pressure on rail      Knee/Hip Exercises: Supine   Other Supine Knee/Hip Exercises --                       PT Short Term Goals - 09/21/21 1053       PT SHORT TERM GOAL #1   Title Pt will be independent in her initial HEP.    Time 4    Period Weeks    Status Achieved    Target Date 09/23/21      PT SHORT TERM GOAL #2   Title Patient reports 50% improvement in sleep    Time 4    Period Weeks    Status Achieved    Target Date 09/23/21      PT SHORT TERM GOAL #3   Title right knee PROM -5* ext to 100* flexion    Time 4    Period Weeks    Status Achieved    Target Date 09/23/21      PT SHORT TERM GOAL #4   Title interum FOTO score improvement >62%    Time 4    Period Weeks    Status Achieved    Target Date 09/23/21               PT Long Term Goals - 09/19/21 1152       PT LONG TERM GOAL #1   Title Pt will be independent in her advanced HEP.    Time 8    Period Weeks    Status On-going    Target Date 10/21/21  PT LONG TERM GOAL #2   Title pt reports no pain in knee with standing, gait & functional activities    Time 8    Period Weeks    Status On-going    Target Date 10/21/21      PT  LONG TERM GOAL #3   Title Pt will improve her FOTO to >/= 65    Time 8    Period Weeks    Status Achieved    Target Date 10/21/21      PT LONG TERM GOAL #4   Title Pt will improve her Single Leg Stance to >/= 15 seconds on right LE.    Time 8    Period Weeks    Status On-going    Target Date 10/21/21      PT LONG TERM GOAL #5   Title Pt will be able to ambulate 500', negotiate ramp & curb and climb a flight of stairs with step over step pattern with no UE support    Baseline update 09/19/2021 : stairs require hand assist for reciprocal gait    Time 8    Period Weeks    Status Partially Met    Target Date 10/21/21      PT LONG TERM GOAL #6   Title right knee AROM -3* ext to 100* antigravity    Time 8    Period Weeks    Status On-going    Target Date 10/21/21                   Plan - 09/21/21 1054     Clinical Impression Statement PT advanced balance exercises and she reported challenging with fatigue.  Pt improved technique on stairs alternating pattern with instruction.  She continues to benefit from skilled PT to progress her mobility & safety.    Comorbidities arthritis, Left TKA 02/15/21, HLD, asthma    Examination-Activity Limitations Carry;Locomotion Level;Sleep;Squat;Stairs;Stand;Transfers    Examination-Participation Restrictions Community Activity    Rehab Potential Good    PT Treatment/Interventions ADLs/Self Care Home Management;Cryotherapy;Moist Heat;DME Instruction;Gait training;Stair training;Functional mobility training;Therapeutic activities;Therapeutic exercise;Balance training;Neuromuscular re-education;Patient/family education;Manual techniques;Scar mobilization;Passive range of motion;Taping;Vasopneumatic Device;Joint Manipulations    PT Next Visit Plan Progressive strengthening and balance improvement intervention.    PT Home Exercise Plan Access Code: [email or web address]    Consulted and Agree with Plan of Care Patient             Patient will  benefit from skilled therapeutic intervention in order to improve the following deficits and impairments:  Abnormal gait, Decreased activity tolerance, Decreased balance, Decreased endurance, Decreased mobility, Decreased range of motion, Decreased scar mobility, Decreased strength, Difficulty walking, Increased edema, Impaired flexibility, Obesity, Pain, Postural dysfunction  Visit Diagnosis: Acute pain of right knee  Stiffness of right knee, not elsewhere classified  Muscle weakness (generalized)  Other abnormalities of gait and mobility  Localized edema  Unsteadiness on feet     Problem List Patient Active Problem List   Diagnosis Date Noted   DJD (degenerative joint disease) of knee 08/09/2021   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 [place], [place], [address] Phone: [phone]   Fax:  [phone]  Name: Katie Weber MRN: [id number] Date of Birth: 04-Nov-1945

## 2021-09-23 ENCOUNTER — Encounter: Payer: Medicare Other | Admitting: Rehabilitative and Restorative Service Providers"

## 2021-09-27 ENCOUNTER — Encounter: Payer: Self-pay | Admitting: Rehabilitative and Restorative Service Providers"

## 2021-09-27 ENCOUNTER — Encounter: Payer: Medicare Other | Admitting: Physical Therapy

## 2021-09-27 ENCOUNTER — Ambulatory Visit (INDEPENDENT_AMBULATORY_CARE_PROVIDER_SITE_OTHER): Payer: Medicare Other | Admitting: Rehabilitative and Restorative Service Providers"

## 2021-09-27 ENCOUNTER — Other Ambulatory Visit: Payer: Self-pay

## 2021-09-27 DIAGNOSIS — M25661 Stiffness of right knee, not elsewhere classified: Secondary | ICD-10-CM | POA: Diagnosis not present

## 2021-09-27 DIAGNOSIS — M6281 Muscle weakness (generalized): Secondary | ICD-10-CM | POA: Diagnosis not present

## 2021-09-27 DIAGNOSIS — R600 Localized edema: Secondary | ICD-10-CM

## 2021-09-27 DIAGNOSIS — M25561 Pain in right knee: Secondary | ICD-10-CM | POA: Diagnosis not present

## 2021-09-27 DIAGNOSIS — R2689 Other abnormalities of gait and mobility: Secondary | ICD-10-CM | POA: Diagnosis not present

## 2021-09-27 DIAGNOSIS — R2681 Unsteadiness on feet: Secondary | ICD-10-CM

## 2021-09-27 NOTE — Therapy (Signed)
[facility] Physical Therapy [address] [place], [place], [address] Phone: [phone]   Fax:  [phone]  Physical Therapy Treatment  Patient Details  Name: Katie Weber MRN: [id number] Date of Birth: [birth year] Referring Provider (PT): Jean Rosenthal, MD   Encounter Date: 09/27/2021   PT End of Session - 09/27/21 1102     Visit Number 12    Number of Visits 18    Date for PT Re-Evaluation 10/21/21    Authorization Type Med A/B and generic comm secondary    Progress Note Due on Visit 20    PT Start Time 1059    PT Stop Time 1139    PT Time Calculation (min) 40 min    Activity Tolerance Patient tolerated treatment well    Behavior During Therapy [facility] for tasks assessed/performed             Past Medical History:  Diagnosis Date   Anemia    when pt was younger   Arthritis    arthritis fingers   Asthma    Coronary artery calcification 04/24/2017   Diverticulitis    x2 -last 1'17 tx. outpatient.   Environmental and seasonal allergies    2014 Possibly related to air freshners.   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   Family history of anesthesia complication 95MWU ago   daughter had an allergic reaction to atropine   GERD (gastroesophageal reflux disease)    no meds   History of hiatal hernia 09/14/2014   seen on xray   History of kidney stones    '80's lithotripsy-[facility] x1   History of kidney stones    x1   Hyperlipidemia    Hypothyroidism    Pneumonia 2014   Pure hypercholesterolemia    Tremor of left hand     Past Surgical History:  Procedure Laterality Date   BALLOON DILATION N/A 05/04/2014   Procedure: BALLOON DILATION;  Surgeon: Garlan Fair, MD;  [facility];  Service: Endoscopy;  Laterality: N/A;   BREAST LUMPECTOMY Right 1972   benign    BREAST SURGERY Right    '72 -benign tumor   childbirth- NVD x2      COLONOSCOPY WITH  PROPOFOL N/A 05/04/2014   Procedure: COLONOSCOPY WITH PROPOFOL;  Surgeon: Garlan Fair, MD;  [facility];  Service: Endoscopy;  Laterality: N/A;   DILATION AND CURETTAGE OF UTERUS     04-20-14 [facility] endometrial polyp removed.   ESOPHAGOGASTRODUODENOSCOPY (EGD) WITH PROPOFOL N/A 05/04/2014   Procedure: ESOPHAGOGASTRODUODENOSCOPY (EGD) WITH PROPOFOL;  Surgeon: Garlan Fair, MD;  [facility];  Service: Endoscopy;  Laterality: N/A;. Procedure done in [place], [place]- 5 yrs ago.   HYSTEROSCOPY WITH D & C N/A 04/20/2014   Procedure: DILATATION AND CURETTAGE /HYSTEROSCOPY;  Surgeon: [name], MD;  Location: [facility];  Service: Gynecology;  Laterality: N/A;   LITHOTRIPSY     POLYPECTOMY  2020   Dr. Lear Ng   TOTAL KNEE ARTHROPLASTY Left 02/15/2021   Procedure: LEFT TOTAL KNEE ARTHROPLASTY;  Surgeon: Mcarthur Rossetti, MD;  Location: [place];  Service: Orthopedics;  Laterality: Left;   TOTAL KNEE ARTHROPLASTY Right 08/09/2021   Procedure: RIGHT TOTAL KNEE ARTHROPLASTY;  Surgeon: Mcarthur Rossetti, MD;  Location: [place];  Service: Orthopedics;  Laterality: Right;  Needs RNFA   TUBAL LIGATION      There were no vitals filed for this visit.  Subjective Assessment - 09/27/21 1102     Subjective Pt. indicated some complaints after doing step ladder work, nothing major.  Pt. indicated stiffness today primary complaint but not painful.    Pertinent History arthritis, Left TKA 02/15/21, HLD, asthma    Patient Stated Goals to walk trials and active in community    Currently in Pain? No/denies    Pain Score 0-No pain    Pain Onset More than a month ago                [facility] PT Assessment - 09/27/21 0001       Assessment   Medical Diagnosis Z96.651 (ICD-10-CM) - Status post total right knee replacement    Referring Provider (PT) Jean Rosenthal, MD    Onset Date/Surgical Date 08/09/21    Hand Dominance Right                            [facility] Adult PT Treatment/Exercise - 09/27/21 0001       Neuro Re-ed    Neuro Re-ed Details  tandem stance on foam 30 sec x 4 bilateral (occasional hand touch assist), lateral side stepping c green band lateral resistance 3 cones x 8 bilateral, fitter rocker board 30x fwd/back, 30 x side to side.   Performed to improve neuromuscular coordination and response     Knee/Hip Exercises: Aerobic   Stationary Bike Full revolutions seat 3 lvl 3 10 mins      Knee/Hip Exercises: Machines for Strengthening   Cybex Leg Press Double leg 87 lbs x 5, Rt leg 2 x 10 c back flat 87 lbs      Knee/Hip Exercises: Standing   Other Standing Knee Exercises hip abduction into doorframe 5 sec hold x 5                       PT Short Term Goals - 09/21/21 1053       PT SHORT TERM GOAL #1   Title Pt will be independent in her initial HEP.    Time 4    Period Weeks    Status Achieved    Target Date 09/23/21      PT SHORT TERM GOAL #2   Title Patient reports 50% improvement in sleep    Time 4    Period Weeks    Status Achieved    Target Date 09/23/21      PT SHORT TERM GOAL #3   Title right knee PROM -5* ext to 100* flexion    Time 4    Period Weeks    Status Achieved    Target Date 09/23/21      PT SHORT TERM GOAL #4   Title interum FOTO score improvement >62%    Time 4    Period Weeks    Status Achieved    Target Date 09/23/21               PT Long Term Goals - 09/19/21 1152       PT LONG TERM GOAL #1   Title Pt will be independent in her advanced HEP.    Time 8    Period Weeks    Status On-going    Target Date 10/21/21      PT LONG TERM GOAL #2   Title pt reports no pain in knee with standing, gait & functional activities    Time 8    Period  Weeks    Status On-going    Target Date 10/21/21      PT LONG TERM GOAL #3   Title Pt will improve her FOTO to >/= 65    Time 8    Period Weeks    Status Achieved    Target Date 10/21/21       PT LONG TERM GOAL #4   Title Pt will improve her Single Leg Stance to >/= 15 seconds on right LE.    Time 8    Period Weeks    Status On-going    Target Date 10/21/21      PT LONG TERM GOAL #5   Title Pt will be able to ambulate 500', negotiate ramp & curb and climb a flight of stairs with step over step pattern with no UE support    Baseline update 09/19/2021 : stairs require hand assist for reciprocal gait    Time 8    Period Weeks    Status Partially Met    Target Date 10/21/21      PT LONG TERM GOAL #6   Title right knee AROM -3* ext to 100* antigravity    Time 8    Period Weeks    Status On-going    Target Date 10/21/21                   Plan - 09/27/21 1131     Clinical Impression Statement Per patient request and comments and in agreement with overall objective presentation, additional time spent focus on movement coordination control in static and dynamic movements to improve control in daily ambulation. Overall fair to good control noted.  Lateral hip weakness observed in movements for Rt> Lt.    Comorbidities arthritis, Left TKA 02/15/21, HLD, asthma    Examination-Activity Limitations Carry;Locomotion Level;Sleep;Squat;Stairs;Stand;Transfers    Examination-Participation Restrictions Community Activity    Rehab Potential Good    PT Treatment/Interventions ADLs/Self Care Home Management;Cryotherapy;Moist Heat;DME Instruction;Gait training;Stair training;Functional mobility training;Therapeutic activities;Therapeutic exercise;Balance training;Neuromuscular re-education;Patient/family education;Manual techniques;Scar mobilization;Passive range of motion;Taping;Vasopneumatic Device;Joint Manipulations    PT Next Visit Plan Progressive strengthening and balance dynamic and compliant surface.    PT Home Exercise Plan Access Code: [email or web address]    Consulted and Agree with Plan of Care Patient             Patient will benefit from skilled therapeutic intervention  in order to improve the following deficits and impairments:  Abnormal gait, Decreased activity tolerance, Decreased balance, Decreased endurance, Decreased mobility, Decreased range of motion, Decreased scar mobility, Decreased strength, Difficulty walking, Increased edema, Impaired flexibility, Obesity, Pain, Postural dysfunction  Visit Diagnosis: Acute pain of right knee  Stiffness of right knee, not elsewhere classified  Muscle weakness (generalized)  Other abnormalities of gait and mobility  Localized edema  Unsteadiness on feet     Problem List Patient Active Problem List   Diagnosis Date Noted   DJD (degenerative joint disease) of knee 08/09/2021   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   Unilateral primary osteoarthritis, right knee 06/06/2021   Status post total left knee replacement 02/15/2021   Unilateral primary osteoarthritis, left knee 01/04/2021   Coronary artery calcification 04/24/2017   Hyperlipidemia 04/24/2017   Acute bronchitis 09/29/2014   SBO (small bowel obstruction) ([place]) 09/27/2014   Nausea and vomiting 09/27/2014   Hypothyroidism 09/27/2014   Leukocytosis 09/27/2014    Scot Jun, PT, DPT, OCS, ATC 09/27/21  11:42 AM    [facility] [address]  [place], [place], [address] Phone: [phone]   Fax:  [phone]  Name: Meeya Goldin MRN: [id number] Date of Birth: [birth year]

## 2021-09-29 ENCOUNTER — Ambulatory Visit (INDEPENDENT_AMBULATORY_CARE_PROVIDER_SITE_OTHER): Payer: Medicare Other | Admitting: Physical Therapy

## 2021-09-29 ENCOUNTER — Other Ambulatory Visit: Payer: Self-pay

## 2021-09-29 ENCOUNTER — Encounter: Payer: Self-pay | Admitting: Physical Therapy

## 2021-09-29 DIAGNOSIS — R2689 Other abnormalities of gait and mobility: Secondary | ICD-10-CM

## 2021-09-29 DIAGNOSIS — M25661 Stiffness of right knee, not elsewhere classified: Secondary | ICD-10-CM

## 2021-09-29 DIAGNOSIS — M6281 Muscle weakness (generalized): Secondary | ICD-10-CM | POA: Diagnosis not present

## 2021-09-29 DIAGNOSIS — R600 Localized edema: Secondary | ICD-10-CM

## 2021-09-29 DIAGNOSIS — M25561 Pain in right knee: Secondary | ICD-10-CM

## 2021-09-29 DIAGNOSIS — R2681 Unsteadiness on feet: Secondary | ICD-10-CM

## 2021-09-29 NOTE — Therapy (Signed)
[facility] [address] [place], [place], [address] Phone: [phone]   Fax:  [phone]  Physical Therapy Treatment  Patient Details  Name: Katie Weber MRN: [id number] Date of Birth: 03/26/1946 Referring Provider (PT): Doneen Poisson, MD   Encounter Date: 09/29/2021   PT End of Session - 09/29/21 1052     Visit Number 13    Number of Visits 18    Date for PT Re-Evaluation 10/21/21    Authorization Type Med A/B and generic comm secondary    Progress Note Due on Visit 20    PT Start Time 1005    PT Stop Time 1045    PT Time Calculation (min) 40 min    Activity Tolerance Patient tolerated treatment well    Behavior During Therapy [facility] for tasks assessed/performed             Past Medical History:  Diagnosis Date   Anemia    when pt was younger   Arthritis    arthritis fingers   Asthma    Coronary artery calcification 04/24/2017   Diverticulitis    x2 -last 1'17 tx. outpatient.   Environmental and seasonal allergies    2014 Possibly related to air freshners.   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   Family history of anesthesia complication 55yrs ago   daughter had an allergic reaction to atropine   GERD (gastroesophageal reflux disease)    no meds   History of hiatal hernia 09/14/2014   seen on xray   History of kidney stones    '80's lithotripsy-[facility] x1   History of kidney stones    x1   Hyperlipidemia    Hypothyroidism    Pneumonia 2014   Pure hypercholesterolemia    Tremor of left hand     Past Surgical History:  Procedure Laterality Date   BALLOON DILATION N/A 05/04/2014   Procedure: BALLOON DILATION;  Surgeon: Charolett Bumpers, MD;  Location: [facility];  Service: Endoscopy;  Laterality: N/A;   BREAST LUMPECTOMY Right 1972   benign    BREAST SURGERY Right    '72 -benign tumor   childbirth- NVD x2      COLONOSCOPY WITH  PROPOFOL N/A 05/04/2014   Procedure: COLONOSCOPY WITH PROPOFOL;  Surgeon: Charolett Bumpers, MD;  Location: [facility];  Service: Endoscopy;  Laterality: N/A;   DILATION AND CURETTAGE OF UTERUS     04-20-14 [facility] hospital endometrial polyp removed.   ESOPHAGOGASTRODUODENOSCOPY (EGD) WITH PROPOFOL N/A 05/04/2014   Procedure: ESOPHAGOGASTRODUODENOSCOPY (EGD) WITH PROPOFOL;  Surgeon: Charolett Bumpers, MD;  [facility]: [facility];  Service: Endoscopy;  Laterality: N/A;. Procedure done in [place], [place]- 5 yrs ago.   HYSTEROSCOPY WITH D & C N/A 04/20/2014   Procedure: DILATATION AND CURETTAGE /HYSTEROSCOPY;  Surgeon: Essie Hart, MD;  Location: [facility];  Service: Gynecology;  Laterality: N/A;   LITHOTRIPSY     POLYPECTOMY  2020   Dr. Shirley Friar   TOTAL KNEE ARTHROPLASTY Left 02/15/2021   Procedure: LEFT TOTAL KNEE ARTHROPLASTY;  Surgeon: Kathryne Hitch, MD;  Location[facility] OR;  Service: Orthopedics;  Laterality: Left;   TOTAL KNEE ARTHROPLASTY Right 08/09/2021   Procedure: RIGHT TOTAL KNEE ARTHROPLASTY;  Surgeon: Kathryne Hitch, MD;  Location: [facility] OR;  Service: Orthopedics;  Laterality: Right;  Needs RNFA   TUBAL LIGATION      There were no vitals filed for this visit.  Subjective Assessment - 09/29/21 1007     Subjective doing well; working on exercises from the other day    Pertinent History arthritis, Left TKA 02/15/21, HLD, asthma    Patient Stated Goals to walk trials and active in community    Currently in Pain? No/denies                               [facility] Adult PT Treatment/Exercise - 09/29/21 1005       Knee/Hip Exercises: Aerobic   Stationary Bike Full revolutions seat 3 lvl 3 10 mins      Knee/Hip Exercises: Machines for Strengthening   Cybex Leg Press bil push 112# 2x10; then RLE only 112# 2x10      Knee/Hip Exercises: Standing   Hip Abduction Both;2 sets;10 reps    Abduction Limitations with L4 band around ankles    Hip  Extension Both;2 sets;10 reps;Knee straight    Extension Limitations with L4 band around ankles                 Balance Exercises - 09/29/21 0001       Balance Exercises: Standing   Tandem Gait Forward;Intermittent upper extremity support;4 reps    Sidestepping 4 reps   along counter top   Other Standing Exercises marching x 4 laps at counter (no UE support), then braiding x 4 laps                  PT Short Term Goals - 09/21/21 1053       PT SHORT TERM GOAL #1   Title Pt will be independent in her initial HEP.    Time 4    Period Weeks    Status Achieved    Target Date 09/23/21      PT SHORT TERM GOAL #2   Title Patient reports 50% improvement in sleep    Time 4    Period Weeks    Status Achieved    Target Date 09/23/21      PT SHORT TERM GOAL #3   Title right knee PROM -5* ext to 100* flexion    Time 4    Period Weeks    Status Achieved    Target Date 09/23/21      PT SHORT TERM GOAL #4   Title interum FOTO score improvement >62%    Time 4    Period Weeks    Status Achieved    Target Date 09/23/21               PT Long Term Goals - 09/19/21 1152       PT LONG TERM GOAL #1   Title Pt will be independent in her advanced HEP.    Time 8    Period Weeks    Status On-going    Target Date 10/21/21      PT LONG TERM GOAL #2   Title pt reports no pain in knee with standing, gait & functional activities    Time 8    Period Weeks    Status On-going    Target Date 10/21/21      PT LONG TERM GOAL #3   Title Pt will improve her FOTO to >/= 65    Time 8    Period Weeks    Status Achieved    Target Date 10/21/21      PT LONG TERM GOAL #4   Title Pt  will improve her Single Leg Stance to >/= 15 seconds on right LE.    Time 8    Period Weeks    Status On-going    Target Date 10/21/21      PT LONG TERM GOAL #5   Title Pt will be able to ambulate 500', negotiate ramp & curb and climb a flight of stairs with step over step pattern with  no UE support    Baseline update 09/19/2021 : stairs require hand assist for reciprocal gait    Time 8    Period Weeks    Status Partially Met    Target Date 10/21/21      PT LONG TERM GOAL #6   Title right knee AROM -3* ext to 100* antigravity    Time 8    Period Weeks    Status On-going    Target Date 10/21/21                   Plan - 09/29/21 1052     Clinical Impression Statement Pt tolerated session well today focusing on strengthening and balance activities.  Pt without LOB with high level dynamic balance activiites and able to correct postrual sway without external support.  Will continue to benefit from PT to maximzie function.    Comorbidities arthritis, Left TKA 02/15/21, HLD, asthma    Examination-Activity Limitations Carry;Locomotion Level;Sleep;Squat;Stairs;Stand;Transfers    Examination-Participation Restrictions Community Activity    Rehab Potential Good    PT Treatment/Interventions ADLs/Self Care Home Management;Cryotherapy;Moist Heat;DME Instruction;Gait training;Stair training;Functional mobility training;Therapeutic activities;Therapeutic exercise;Balance training;Neuromuscular re-education;Patient/family education;Manual techniques;Scar mobilization;Passive range of motion;Taping;Vasopneumatic Device;Joint Manipulations    PT Next Visit Plan Progressive strengthening and balance dynamic and compliant surface; work on SLS activiites    PT Home Exercise Plan Access Code: [email or web address]    Consulted and Agree with Plan of Care Patient             Patient will benefit from skilled therapeutic intervention in order to improve the following deficits and impairments:  Abnormal gait, Decreased activity tolerance, Decreased balance, Decreased endurance, Decreased mobility, Decreased range of motion, Decreased scar mobility, Decreased strength, Difficulty walking, Increased edema, Impaired flexibility, Obesity, Pain, Postural dysfunction  Visit Diagnosis: Acute pain  of right knee  Stiffness of right knee, not elsewhere classified  Muscle weakness (generalized)  Other abnormalities of gait and mobility  Localized edema  Unsteadiness on feet     Problem List Patient Active Problem List   Diagnosis Date Noted   DJD (degenerative joint disease) of knee 08/09/2021   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   Unilateral primary osteoarthritis, right knee 06/06/2021   Status post total left knee replacement 02/15/2021   Unilateral primary osteoarthritis, left knee 01/04/2021   Coronary artery calcification 04/24/2017   Hyperlipidemia 04/24/2017   Acute bronchitis 09/29/2014   SBO (small bowel obstruction) ([place]) 09/27/2014   Nausea and vomiting 09/27/2014   Hypothyroidism 09/27/2014   Leukocytosis 09/27/2014      Laureen Abrahams, PT, DPT 09/29/21 10:54 AM     [facility] [address] [place], [place], [address] Phone: [phone]   Fax:  [phone]  Name: Katie Weber MRN: [id number] Date of Birth: 1946/09/13

## 2021-10-04 ENCOUNTER — Ambulatory Visit (INDEPENDENT_AMBULATORY_CARE_PROVIDER_SITE_OTHER): Payer: Medicare Other | Admitting: Physical Therapy

## 2021-10-04 ENCOUNTER — Encounter: Payer: Self-pay | Admitting: Physical Therapy

## 2021-10-04 ENCOUNTER — Other Ambulatory Visit: Payer: Self-pay

## 2021-10-04 DIAGNOSIS — R600 Localized edema: Secondary | ICD-10-CM

## 2021-10-04 DIAGNOSIS — M6281 Muscle weakness (generalized): Secondary | ICD-10-CM

## 2021-10-04 DIAGNOSIS — M25661 Stiffness of right knee, not elsewhere classified: Secondary | ICD-10-CM

## 2021-10-04 DIAGNOSIS — R2681 Unsteadiness on feet: Secondary | ICD-10-CM

## 2021-10-04 DIAGNOSIS — R2689 Other abnormalities of gait and mobility: Secondary | ICD-10-CM

## 2021-10-04 DIAGNOSIS — M25561 Pain in right knee: Secondary | ICD-10-CM | POA: Diagnosis not present

## 2021-10-04 NOTE — Therapy (Signed)
[facility] [address] [place], [place], [address] Phone: [phone]   Fax:  [phone]  Physical Therapy Treatment  Patient Details  Name: Katie Weber MRN: [id number] Date of Birth: Jul 01, 1946 Referring Provider (PT): Jean Rosenthal, MD   Encounter Date: 10/04/2021   PT End of Session - 10/04/21 1023     Visit Number 14    Number of Visits 19    Date for PT Re-Evaluation 10/21/21    Authorization Type Med A/B and generic comm secondary    Progress Note Due on Visit 20    PT Start Time 1015    PT Stop Time 1100    PT Time Calculation (min) 45 min    Activity Tolerance Patient tolerated treatment well    Behavior During Therapy [facility] for tasks assessed/performed             Past Medical History:  Diagnosis Date   Anemia    when pt was younger   Arthritis    arthritis fingers   Asthma    Coronary artery calcification 04/24/2017   Diverticulitis    x2 -last 1'17 tx. outpatient.   Environmental and seasonal allergies    2014 Possibly related to air freshners.   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   Family history of anesthesia complication 62EZM ago   daughter had an allergic reaction to atropine   GERD (gastroesophageal reflux disease)    no meds   History of hiatal hernia 09/14/2014   seen on xray   History of kidney stones    '80's lithotripsy-[facility] x1   History of kidney stones    x1   Hyperlipidemia    Hypothyroidism    Pneumonia 2014   Pure hypercholesterolemia    Tremor of left hand     Past Surgical History:  Procedure Laterality Date   BALLOON DILATION N/A 05/04/2014   Procedure: BALLOON DILATION;  Surgeon: Garlan Fair, MD;  [facility];  Service: Endoscopy;  Laterality: N/A;   BREAST LUMPECTOMY Right 1972   benign    BREAST SURGERY Right    '72 -benign tumor   childbirth- NVD x2      COLONOSCOPY WITH  PROPOFOL N/A 05/04/2014   Procedure: COLONOSCOPY WITH PROPOFOL;  Surgeon: Garlan Fair, MD;  [facility];  Service: Endoscopy;  Laterality: N/A;   DILATION AND CURETTAGE OF UTERUS     04-20-14 [facility] hospital endometrial polyp removed.   ESOPHAGOGASTRODUODENOSCOPY (EGD) WITH PROPOFOL N/A 05/04/2014   Procedure: ESOPHAGOGASTRODUODENOSCOPY (EGD) WITH PROPOFOL;  Surgeon: Garlan Fair, MD;  [facility];  Service: Endoscopy;  Laterality: N/A;. Procedure done in [place], [place]- 5 yrs ago.   HYSTEROSCOPY WITH D & C N/A 04/20/2014   Procedure: DILATATION AND CURETTAGE /HYSTEROSCOPY;  Surgeon: [name], MD;  Location: [facility];  Service: Gynecology;  Laterality: N/A;   LITHOTRIPSY     POLYPECTOMY  2020   Dr. Lear Ng   TOTAL KNEE ARTHROPLASTY Left 02/15/2021   Procedure: LEFT TOTAL KNEE ARTHROPLASTY;  Surgeon: Mcarthur Rossetti, MD;  Location: [facility];  Service: Orthopedics;  Laterality: Left;   TOTAL KNEE ARTHROPLASTY Right 08/09/2021   Procedure: RIGHT TOTAL KNEE ARTHROPLASTY;  Surgeon: Mcarthur Rossetti, MD;  Location: [facility];  Service: Orthopedics;  Laterality: Right;  Needs RNFA   TUBAL LIGATION      There were no vitals filed for this visit.  Subjective Assessment - 10/04/21 1021     Subjective Her knees are doing well.  She stayed home due to weather.    Pertinent History arthritis, Left TKA 02/15/21, HLD, asthma    Patient Stated Goals to walk trials and active in community    Currently in Pain? No/denies                               [facility] Adult PT Treatment/Exercise - 10/04/21 1015       Neuro Re-ed    Neuro Re-ed Details  tandem stance on foam 30 sec x 2 bilateral (occasional hand touch assist), fitter rocker board round w/single pivot point (more difficult) 20x fwd/back, 20x side to side.      Knee/Hip Exercises: Aerobic   Stationary Bike Full revolutions seat 3 lvl 3 10 mins      Knee/Hip Exercises:  Machines for Strengthening   Cybex Leg Press bil push 125# 2x10; then RLE only 75# 2x10      Knee/Hip Exercises: Standing   Hip Abduction Both;2 sets;10 reps;Knee straight    Abduction Limitations with L4 band around ankles    Hip Extension Both;2 sets;10 reps;Knee straight    Extension Limitations with L4 band around ankles      Knee/Hip Exercises: Seated   Hamstring Curl Strengthening;Right;2 sets;10 reps   green theraband   Hamstring Limitations PT demo & verbal cues on how to set up at home                       PT Short Term Goals - 09/21/21 1053       PT SHORT TERM GOAL #1   Title Pt will be independent in her initial HEP.    Time 4    Period Weeks    Status Achieved    Target Date 09/23/21      PT SHORT TERM GOAL #2   Title Patient reports 50% improvement in sleep    Time 4    Period Weeks    Status Achieved    Target Date 09/23/21      PT SHORT TERM GOAL #3   Title right knee PROM -5* ext to 100* flexion    Time 4    Period Weeks    Status Achieved    Target Date 09/23/21      PT SHORT TERM GOAL #4   Title interum FOTO score improvement >62%    Time 4    Period Weeks    Status Achieved    Target Date 09/23/21               PT Long Term Goals - 09/19/21 1152       PT LONG TERM GOAL #1   Title Pt will be independent in her advanced HEP.    Time 8    Period Weeks    Status On-going    Target Date 10/21/21      PT LONG TERM GOAL #2   Title pt reports no pain in knee with standing, gait & functional activities    Time 8    Period Weeks    Status On-going    Target Date 10/21/21      PT LONG TERM GOAL #3   Title Pt will improve her FOTO to >/= 65    Time 8    Period Weeks    Status Achieved  Target Date 10/21/21      PT LONG TERM GOAL #4   Title Pt will improve her Single Leg Stance to >/= 15 seconds on right LE.    Time 8    Period Weeks    Status On-going    Target Date 10/21/21      PT LONG TERM GOAL #5   Title  Pt will be able to ambulate 500', negotiate ramp & curb and climb a flight of stairs with step over step pattern with no UE support    Baseline update 09/19/2021 : stairs require hand assist for reciprocal gait    Time 8    Period Weeks    Status Partially Met    Target Date 10/21/21      PT LONG TERM GOAL #6   Title right knee AROM -3* ext to 100* antigravity    Time 8    Period Weeks    Status On-going    Target Date 10/21/21                   Plan - 10/04/21 1023     Clinical Impression Statement PT progressed difficulty of standing balance with rocker board which challenged her but she improved with instruction & practice. Pt appears on target to meet LTGs by end of POC.    Comorbidities arthritis, Left TKA 02/15/21, HLD, asthma    Examination-Activity Limitations Carry;Locomotion Level;Sleep;Squat;Stairs;Stand;Transfers    Examination-Participation Restrictions Community Activity    Rehab Potential Good    PT Treatment/Interventions ADLs/Self Care Home Management;Cryotherapy;Moist Heat;DME Instruction;Gait training;Stair training;Functional mobility training;Therapeutic activities;Therapeutic exercise;Balance training;Neuromuscular re-education;Patient/family education;Manual techniques;Scar mobilization;Passive range of motion;Taping;Vasopneumatic Device;Joint Manipulations    PT Next Visit Plan continue with progressive strengthening and balance dynamic and compliant surface; work on SLS activiites    PT Home Exercise Plan Access Code: [email or web address]    Consulted and Agree with Plan of Care Patient             Patient will benefit from skilled therapeutic intervention in order to improve the following deficits and impairments:  Abnormal gait, Decreased activity tolerance, Decreased balance, Decreased endurance, Decreased mobility, Decreased range of motion, Decreased scar mobility, Decreased strength, Difficulty walking, Increased edema, Impaired flexibility, Obesity, Pain,  Postural dysfunction  Visit Diagnosis: Acute pain of right knee  Stiffness of right knee, not elsewhere classified  Muscle weakness (generalized)  Other abnormalities of gait and mobility  Localized edema  Unsteadiness on feet     Problem List Patient Active Problem List   Diagnosis Date Noted   DJD (degenerative joint disease) of knee 08/09/2021   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 [place], [place], [address] Phone: [phone]   Fax:  [phone]  Name: Katie Weber MRN: [id number] Date of Birth: [birth year]

## 2021-10-06 ENCOUNTER — Encounter: Payer: Medicare Other | Admitting: Physical Therapy

## 2021-10-11 ENCOUNTER — Other Ambulatory Visit: Payer: Self-pay

## 2021-10-11 ENCOUNTER — Encounter: Payer: Medicare Other | Admitting: Physical Therapy

## 2021-10-11 ENCOUNTER — Ambulatory Visit (INDEPENDENT_AMBULATORY_CARE_PROVIDER_SITE_OTHER): Payer: Medicare Other | Admitting: Rehabilitative and Restorative Service Providers"

## 2021-10-11 ENCOUNTER — Encounter: Payer: Self-pay | Admitting: Rehabilitative and Restorative Service Providers"

## 2021-10-11 DIAGNOSIS — R600 Localized edema: Secondary | ICD-10-CM

## 2021-10-11 DIAGNOSIS — M6281 Muscle weakness (generalized): Secondary | ICD-10-CM | POA: Diagnosis not present

## 2021-10-11 DIAGNOSIS — M25661 Stiffness of right knee, not elsewhere classified: Secondary | ICD-10-CM | POA: Diagnosis not present

## 2021-10-11 DIAGNOSIS — R2689 Other abnormalities of gait and mobility: Secondary | ICD-10-CM | POA: Diagnosis not present

## 2021-10-11 DIAGNOSIS — M25561 Pain in right knee: Secondary | ICD-10-CM | POA: Diagnosis not present

## 2021-10-11 NOTE — Therapy (Signed)
[facility] [address] [place], [place], [address] Phone: [phone]   Fax:  [phone]  Physical Therapy Treatment  Patient Details  Name: Katie Weber MRN: [id number] Date of Birth: 06-23-1946 Referring Provider (PT): Jean Rosenthal, MD   Encounter Date: 10/11/2021   PT End of Session - 10/11/21 1109     Visit Number 15    Number of Visits 19    Date for PT Re-Evaluation 10/21/21    Authorization Type Med A/B and generic comm secondary --- KX at 15    Progress Note Due on Visit 20    PT Start Time 1059    PT Stop Time 1138    PT Time Calculation (min) 39 min    Activity Tolerance Patient tolerated treatment well    Behavior During Therapy [facility] for tasks assessed/performed             Past Medical History:  Diagnosis Date   Anemia    when pt was younger   Arthritis    arthritis fingers   Asthma    Coronary artery calcification 04/24/2017   Diverticulitis    x2 -last 1'17 tx. outpatient.   Environmental and seasonal allergies    2014 Possibly related to air freshners.   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   Family history of anesthesia complication 51VOH ago   daughter had an allergic reaction to atropine   GERD (gastroesophageal reflux disease)    no meds   History of hiatal hernia 09/14/2014   seen on xray   History of kidney stones    '80's lithotripsy-[facility] x1   History of kidney stones    x1   Hyperlipidemia    Hypothyroidism    Pneumonia 2014   Pure hypercholesterolemia    Tremor of left hand     Past Surgical History:  Procedure Laterality Date   BALLOON DILATION N/A 05/04/2014   Procedure: BALLOON DILATION;  Surgeon: Garlan Fair, MD;  Location: [facility];  Service: Endoscopy;  Laterality: N/A;   BREAST LUMPECTOMY Right 1972   benign    BREAST SURGERY Right    '72 -benign tumor   childbirth- NVD x2      COLONOSCOPY  WITH PROPOFOL N/A 05/04/2014   Procedure: COLONOSCOPY WITH PROPOFOL;  Surgeon: Garlan Fair, MD;  Location: [facility];  Service: Endoscopy;  Laterality: N/A;   DILATION AND CURETTAGE OF UTERUS     04-20-14 [facility] hospital endometrial polyp removed.   ESOPHAGOGASTRODUODENOSCOPY (EGD) WITH PROPOFOL N/A 05/04/2014   Procedure: ESOPHAGOGASTRODUODENOSCOPY (EGD) WITH PROPOFOL;  Surgeon: Garlan Fair, MD;  [facility];  Service: Endoscopy;  Laterality: N/A;. Procedure done in [place], [place]- 5 yrs ago.   HYSTEROSCOPY WITH D & C N/A 04/20/2014   Procedure: DILATATION AND CURETTAGE /HYSTEROSCOPY;  Surgeon: [name], MD;  Location: [facility];  Service: Gynecology;  Laterality: N/A;   LITHOTRIPSY     POLYPECTOMY  2020   Dr. Lear Ng   TOTAL KNEE ARTHROPLASTY Left 02/15/2021   Procedure: LEFT TOTAL KNEE ARTHROPLASTY;  Surgeon: Mcarthur Rossetti, MD;  Location: [place];  Service: Orthopedics;  Laterality: Left;   TOTAL KNEE ARTHROPLASTY Right 08/09/2021   Procedure: RIGHT TOTAL KNEE ARTHROPLASTY;  Surgeon: Mcarthur Rossetti, MD;  Location: [place];  Service: Orthopedics;  Laterality: Right;  Needs RNFA   TUBAL LIGATION      There were no vitals filed  for this visit.   Subjective Assessment - 10/11/21 1110     Subjective Pt. indicated a little pain but nothing major.  Pt. indicated both knees feel a little swollen and some pain after taking some medicine for tooth.    Pertinent History arthritis, Left TKA 02/15/21, HLD, asthma    Patient Stated Goals to walk trials and active in community    Currently in Pain? Yes    Pain Score 4     Pain Location Knee    Pain Orientation Left;Right                [facility] PT Assessment - 10/11/21 0001       Assessment   Medical Diagnosis Z96.651 (ICD-10-CM) - Status post total right knee replacement    Referring Provider (PT) Jean Rosenthal, MD    Onset Date/Surgical Date 08/09/21    Hand Dominance Right       Single Leg Stance   Comments Lt SLS 17 seconds, Rt SLS 17                           [facility] Adult PT Treatment/Exercise - 10/11/21 0001       Neuro Re-ed    Neuro Re-ed Details  SLS on foam c occasional HHA 30 sec x 4 bilateral, step up on 4 inch foam x 15 bilateral      Knee/Hip Exercises: Aerobic   Stationary Bike Full revolutions seat 3 lvl 3 10 mins      Knee/Hip Exercises: Machines for Strengthening   Cybex Knee Extension Double leg up, eccentric lowering with one leg 2 x 15 performed bilateral 10 lbs    Cybex Leg Press bil push 125# 4x10; then RLE only 125# 1x10                       PT Short Term Goals - 09/21/21 1053       PT SHORT TERM GOAL #1   Title Pt will be independent in her initial HEP.    Time 4    Period Weeks    Status Achieved    Target Date 09/23/21      PT SHORT TERM GOAL #2   Title Patient reports 50% improvement in sleep    Time 4    Period Weeks    Status Achieved    Target Date 09/23/21      PT SHORT TERM GOAL #3   Title right knee PROM -5* ext to 100* flexion    Time 4    Period Weeks    Status Achieved    Target Date 09/23/21      PT SHORT TERM GOAL #4   Title interum FOTO score improvement >62%    Time 4    Period Weeks    Status Achieved    Target Date 09/23/21               PT Long Term Goals - 09/19/21 1152       PT LONG TERM GOAL #1   Title Pt will be independent in her advanced HEP.    Time 8    Period Weeks    Status On-going    Target Date 10/21/21      PT LONG TERM GOAL #2   Title pt reports no pain in knee with standing, gait & functional activities    Time 8    Period Weeks  Status On-going    Target Date 10/21/21      PT LONG TERM GOAL #3   Title Pt will improve her FOTO to >/= 65    Time 8    Period Weeks    Status Achieved    Target Date 10/21/21      PT LONG TERM GOAL #4   Title Pt will improve her Single Leg Stance to >/= 15 seconds on right LE.    Time 8     Period Weeks    Status On-going    Target Date 10/21/21      PT LONG TERM GOAL #5   Title Pt will be able to ambulate 500', negotiate ramp & curb and climb a flight of stairs with step over step pattern with no UE support    Baseline update 09/19/2021 : stairs require hand assist for reciprocal gait    Time 8    Period Weeks    Status Partially Met    Target Date 10/21/21      PT LONG TERM GOAL #6   Title right knee AROM -3* ext to 100* antigravity    Time 8    Period Weeks    Status On-going    Target Date 10/21/21                   Plan - 10/11/21 1121     Clinical Impression Statement Spent additional time today on compliant surface balance challenges to improve stability for ambulation.  Pt. continued to show good overall progression towards goals of upcoming d/c to HEP.  Medical necessity for continued skilled PT services to improve mobility and reduce fall risk on uneven surfaces.    Comorbidities arthritis, Left TKA 02/15/21, HLD, asthma    Examination-Activity Limitations Carry;Locomotion Level;Sleep;Squat;Stairs;Stand;Transfers    Examination-Participation Restrictions Community Activity    Rehab Potential Good    PT Treatment/Interventions ADLs/Self Care Home Management;Cryotherapy;Moist Heat;DME Instruction;Gait training;Stair training;Functional mobility training;Therapeutic activities;Therapeutic exercise;Balance training;Neuromuscular re-education;Patient/family education;Manual techniques;Scar mobilization;Passive range of motion;Taping;Vasopneumatic Device;Joint Manipulations    PT Next Visit Plan Dynamic and compliant surface balance improvements.    PT Home Exercise Plan Access Code: [email or web address]    Consulted and Agree with Plan of Care Patient             Patient will benefit from skilled therapeutic intervention in order to improve the following deficits and impairments:  Abnormal gait, Decreased activity tolerance, Decreased balance, Decreased  endurance, Decreased mobility, Decreased range of motion, Decreased scar mobility, Decreased strength, Difficulty walking, Increased edema, Impaired flexibility, Obesity, Pain, Postural dysfunction  Visit Diagnosis: Acute pain of right knee  Stiffness of right knee, not elsewhere classified  Muscle weakness (generalized)  Other abnormalities of gait and mobility  Localized edema     Problem List Patient Active Problem List   Diagnosis Date Noted   DJD (degenerative joint disease) of knee 08/09/2021   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   Unilateral primary osteoarthritis, right knee 06/06/2021   Status post total left knee replacement 02/15/2021   Unilateral primary osteoarthritis, left knee 01/04/2021   Coronary artery calcification 04/24/2017   Hyperlipidemia 04/24/2017   Acute bronchitis 09/29/2014   SBO (small bowel obstruction) ([place]) 09/27/2014   Nausea and vomiting 09/27/2014   Hypothyroidism 09/27/2014   Leukocytosis 09/27/2014    Scot Jun, PT, DPT, OCS, ATC 10/11/21  11:37 AM    [facility] [address] [place], [place], [address] Phone: [phone]   Fax:  [phone]  Name: Katie Weber MRN: [id number] Date of Birth: 1946-01-26

## 2021-10-13 ENCOUNTER — Other Ambulatory Visit: Payer: Self-pay

## 2021-10-13 ENCOUNTER — Ambulatory Visit (INDEPENDENT_AMBULATORY_CARE_PROVIDER_SITE_OTHER): Payer: Medicare Other | Admitting: Physical Therapy

## 2021-10-13 ENCOUNTER — Encounter: Payer: Self-pay | Admitting: Physical Therapy

## 2021-10-13 DIAGNOSIS — M25561 Pain in right knee: Secondary | ICD-10-CM | POA: Diagnosis not present

## 2021-10-13 DIAGNOSIS — R2689 Other abnormalities of gait and mobility: Secondary | ICD-10-CM | POA: Diagnosis not present

## 2021-10-13 DIAGNOSIS — M6281 Muscle weakness (generalized): Secondary | ICD-10-CM

## 2021-10-13 DIAGNOSIS — R600 Localized edema: Secondary | ICD-10-CM

## 2021-10-13 DIAGNOSIS — M25661 Stiffness of right knee, not elsewhere classified: Secondary | ICD-10-CM

## 2021-10-13 DIAGNOSIS — R2681 Unsteadiness on feet: Secondary | ICD-10-CM

## 2021-10-13 NOTE — Therapy (Signed)
[facility] [address] [place], [place], [address] Phone: [phone]   Fax:  [phone]  Physical Therapy Treatment/Discharge Summary  Patient Details  Name: Katie Weber MRN: [id number] Date of Birth: November 24, 1945 Referring Provider (PT): Jean Rosenthal, MD   Encounter Date: 10/13/2021   PT End of Session - 10/13/21 1238     Visit Number 16    Authorization Type Med A/B and generic comm secondary --- KX at 15    Progress Note Due on Visit 20    PT Start Time 1142    PT Stop Time 1216    PT Time Calculation (min) 34 min    Activity Tolerance Patient tolerated treatment well    Behavior During Therapy [facility] for tasks assessed/performed             Past Medical History:  Diagnosis Date   Anemia    when pt was younger   Arthritis    arthritis fingers   Asthma    Coronary artery calcification 04/24/2017   Diverticulitis    x2 -last 1'17 tx. outpatient.   Environmental and seasonal allergies    2014 Possibly related to air freshners.   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   Family history of anesthesia complication 14ERX ago   daughter had an allergic reaction to atropine   GERD (gastroesophageal reflux disease)    no meds   History of hiatal hernia 09/14/2014   seen on xray   History of kidney stones    '80's lithotripsy-[facility] x1   History of kidney stones    x1   Hyperlipidemia    Hypothyroidism    Pneumonia 2014   Pure hypercholesterolemia    Tremor of left hand     Past Surgical History:  Procedure Laterality Date   BALLOON DILATION N/A 05/04/2014   Procedure: BALLOON DILATION;  Surgeon: Garlan Fair, MD;  [facility];  Service: Endoscopy;  Laterality: N/A;   BREAST LUMPECTOMY Right 1972   benign    BREAST SURGERY Right    '72 -benign tumor   childbirth- NVD x2      COLONOSCOPY WITH PROPOFOL N/A 05/04/2014   Procedure:  COLONOSCOPY WITH PROPOFOL;  Surgeon: Garlan Fair, MD;  [facility];  Service: Endoscopy;  Laterality: N/A;   DILATION AND CURETTAGE OF UTERUS     04-20-14 [facility] hospital endometrial polyp removed.   ESOPHAGOGASTRODUODENOSCOPY (EGD) WITH PROPOFOL N/A 05/04/2014   Procedure: ESOPHAGOGASTRODUODENOSCOPY (EGD) WITH PROPOFOL;  Surgeon: Garlan Fair, MD;  [facility];  Service: Endoscopy;  Laterality: N/A;. Procedure done in [place], [place]- 5 yrs ago.   HYSTEROSCOPY WITH D & C N/A 04/20/2014   Procedure: DILATATION AND CURETTAGE /HYSTEROSCOPY;  Surgeon: [name], MD;  Location: [facility] [facility];  Service: Gynecology;  Laterality: N/A;   LITHOTRIPSY     POLYPECTOMY  2020   Dr. Lear Ng   TOTAL KNEE ARTHROPLASTY Left 02/15/2021   Procedure: LEFT TOTAL KNEE ARTHROPLASTY;  Surgeon: Mcarthur Rossetti, MD;  Location: [place];  Service: Orthopedics;  Laterality: Left;   TOTAL KNEE ARTHROPLASTY Right 08/09/2021   Procedure: RIGHT TOTAL KNEE ARTHROPLASTY;  Surgeon: Mcarthur Rossetti, MD;  Location: [place];  Service: Orthopedics;  Laterality: Right;  Needs RNFA   TUBAL LIGATION      There were no vitals filed for this visit.   Subjective Assessment - 10/13/21 1148  Subjective ready for d/c today    Pertinent History arthritis, Left TKA 02/15/21, HLD, asthma    Patient Stated Goals to walk trials and active in community    Currently in Pain? No/denies   no pain, just stiffness               [facility] PT Assessment - 10/13/21 1202       Assessment   Medical Diagnosis Z96.651 (ICD-10-CM) - Status post total right knee replacement    Referring Provider (PT) Jean Rosenthal, MD    Onset Date/Surgical Date 08/09/21      Observation/Other Assessments   Focus on Therapeutic Outcomes (FOTO)  99      AROM   Right Knee Extension 0    Right Knee Flexion 101   standing hamstring curl; 123 supine heel slide     Ambulation/Gait   Stairs Yes    Stairs  Assistance 7: Independent    Stair Management Technique No rails;Alternating pattern;Forwards                           [facility] Adult PT Treatment/Exercise - 10/13/21 1147       Knee/Hip Exercises: Aerobic   Stationary Bike L3 x 10 min      Knee/Hip Exercises: Standing   SLS bil LE > 20 sec without UE support                       PT Short Term Goals - 09/21/21 1053       PT SHORT TERM GOAL #1   Title Pt will be independent in her initial HEP.    Time 4    Period Weeks    Status Achieved    Target Date 09/23/21      PT SHORT TERM GOAL #2   Title Patient reports 50% improvement in sleep    Time 4    Period Weeks    Status Achieved    Target Date 09/23/21      PT SHORT TERM GOAL #3   Title right knee PROM -5* ext to 100* flexion    Time 4    Period Weeks    Status Achieved    Target Date 09/23/21      PT SHORT TERM GOAL #4   Title interum FOTO score improvement >62%    Time 4    Period Weeks    Status Achieved    Target Date 09/23/21               PT Long Term Goals - 10/13/21 1239       PT LONG TERM GOAL #1   Title Pt will be independent in her advanced HEP.    Time 8    Period Weeks    Status Achieved    Target Date 10/21/21      PT LONG TERM GOAL #2   Title pt reports no pain in knee with standing, gait & functional activities    Time 8    Period Weeks    Status Achieved    Target Date 10/21/21      PT LONG TERM GOAL #3   Title Pt will improve her FOTO to >/= 65    Time 8    Period Weeks    Status Achieved    Target Date 10/21/21      PT LONG TERM GOAL #4   Title Pt will improve her Single  Leg Stance to >/= 15 seconds on right LE.    Time 8    Period Weeks    Status Achieved    Target Date 10/21/21      PT LONG TERM GOAL #5   Title Pt will be able to ambulate 500', negotiate ramp & curb and climb a flight of stairs with step over step pattern with no UE support    Baseline update 09/19/2021 : stairs  require hand assist for reciprocal gait    Time 8    Period Weeks    Status Achieved    Target Date 10/21/21      PT LONG TERM GOAL #6   Title right knee AROM -3* ext to 100* antigravity    Time 8    Period Weeks    Status Achieved    Target Date 10/21/21                   Plan - 10/13/21 1239     Clinical Impression Statement Pt has met all goals and is ready for d/c from PT today.  Has extensive HEP and reports good understanding and no questions regarding HEP.    Comorbidities arthritis, Left TKA 02/15/21, HLD, asthma    Examination-Activity Limitations Carry;Locomotion Level;Sleep;Squat;Stairs;Stand;Transfers    Examination-Participation Restrictions Community Activity    Rehab Potential Good    PT Treatment/Interventions ADLs/Self Care Home Management;Cryotherapy;Moist Heat;DME Instruction;Gait training;Stair training;Functional mobility training;Therapeutic activities;Therapeutic exercise;Balance training;Neuromuscular re-education;Patient/family education;Manual techniques;Scar mobilization;Passive range of motion;Taping;Vasopneumatic Device;Joint Manipulations    PT Next Visit Plan d/c PT today    PT Home Exercise Plan Access Code: [email or web address]    Consulted and Agree with Plan of Care Patient             Patient will benefit from skilled therapeutic intervention in order to improve the following deficits and impairments:  Abnormal gait, Decreased activity tolerance, Decreased balance, Decreased endurance, Decreased mobility, Decreased range of motion, Decreased scar mobility, Decreased strength, Difficulty walking, Increased edema, Impaired flexibility, Obesity, Pain, Postural dysfunction  Visit Diagnosis: Acute pain of right knee  Stiffness of right knee, not elsewhere classified  Muscle weakness (generalized)  Other abnormalities of gait and mobility  Localized edema  Unsteadiness on feet     Problem List Patient Active Problem List   Diagnosis Date  Noted   DJD (degenerative joint disease) of knee 08/09/2021   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   Unilateral primary osteoarthritis, right knee 06/06/2021   Status post total left knee replacement 02/15/2021   Unilateral primary osteoarthritis, left knee 01/04/2021   Coronary artery calcification 04/24/2017   Hyperlipidemia 04/24/2017   Acute bronchitis 09/29/2014   SBO (small bowel obstruction) ([place]) 09/27/2014   Nausea and vomiting 09/27/2014   Hypothyroidism 09/27/2014   Leukocytosis 09/27/2014      Laureen Abrahams, PT, DPT 10/13/21 12:41 PM    [facility] [address] [place], [place], [address] Phone: [phone]   Fax:  [phone]  Name: Christinna Sprung MRN: [id number] Date of Birth: 07/18/1946    PHYSICAL THERAPY DISCHARGE SUMMARY  Visits from Start of Care: 16  Current functional level related to goals / functional outcomes: See above   Remaining deficits: See above   Education / Equipment: HEP   Patient agrees to discharge. Patient goals were met. Patient is being discharged due to meeting the stated rehab goals.  Laureen Abrahams, PT, DPT 10/13/21 12:41 PM  [facility]  [place], [place], [address] Phone: [phone]   Fax:  [phone]

## 2022-01-04 ENCOUNTER — Other Ambulatory Visit: Payer: Self-pay | Admitting: Gastroenterology

## 2022-01-04 DIAGNOSIS — R1013 Epigastric pain: Secondary | ICD-10-CM

## 2022-01-04 DIAGNOSIS — R112 Nausea with vomiting, unspecified: Secondary | ICD-10-CM

## 2022-01-05 ENCOUNTER — Ambulatory Visit
Admission: RE | Admit: 2022-01-05 | Discharge: 2022-01-05 | Disposition: A | Discharge status: Home / Self Care | Payer: Medicare Other | Source: Ambulatory Visit | Attending: Gastroenterology | Admitting: Gastroenterology

## 2022-01-05 DIAGNOSIS — R1013 Epigastric pain: Secondary | ICD-10-CM

## 2022-01-05 DIAGNOSIS — R112 Nausea with vomiting, unspecified: Secondary | ICD-10-CM

## 2022-01-05 IMAGING — US US ABDOMEN COMPLETE
1 series · 14 of 25 positions shown · non-contrast
Comparison: [DATE].

CLINICAL DATA: Epigastric abdominal pain.

EXAM:
ABDOMEN ULTRASOUND COMPLETE

[Series 1: us abdomen complete · 0.22mm/px · 14 of 95 slices shown]
[im 1/95]
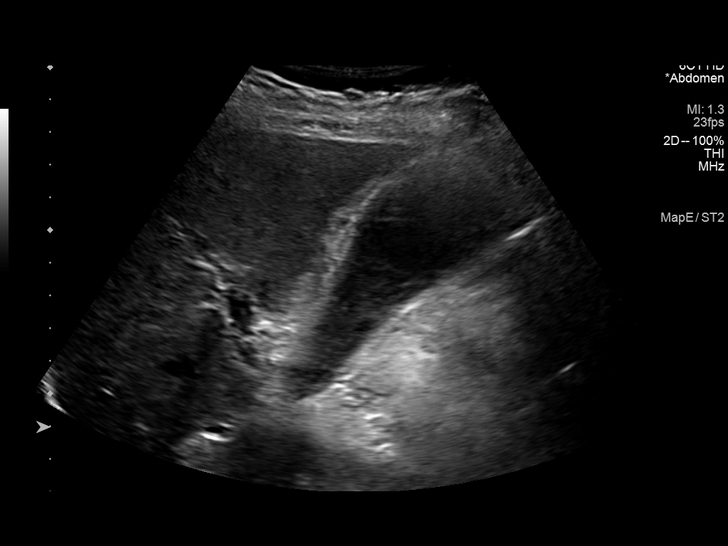
[im 8/95]
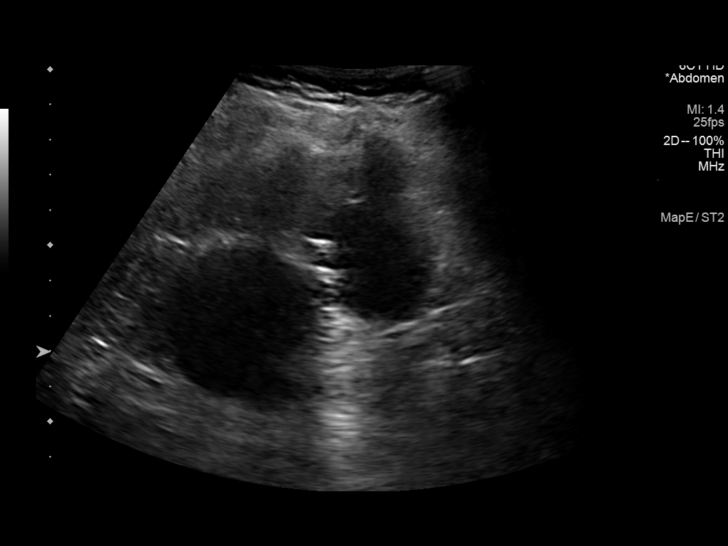
[im 16/95]
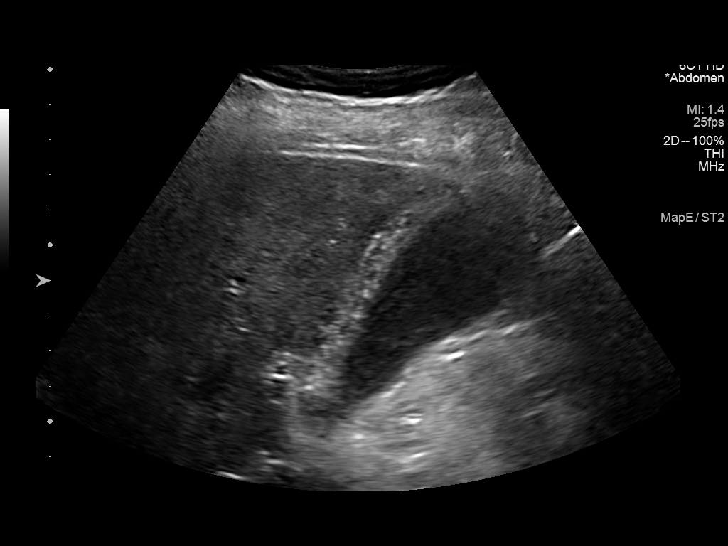
[im 24/95]
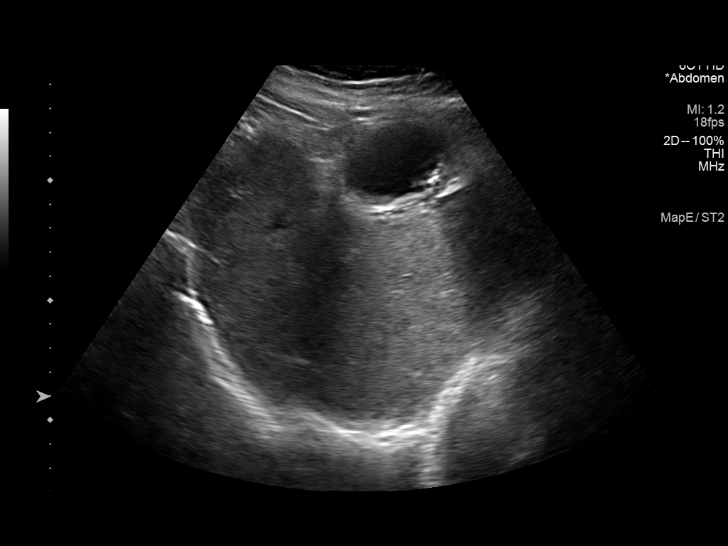
[im 32/95]
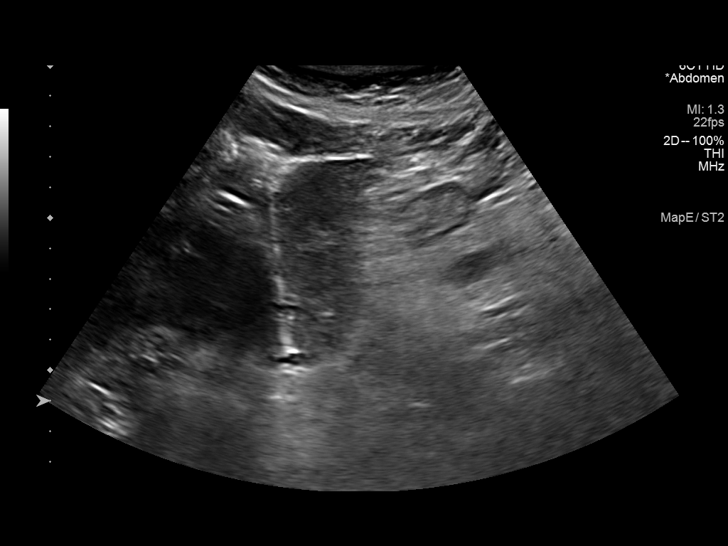
[im 36/95]
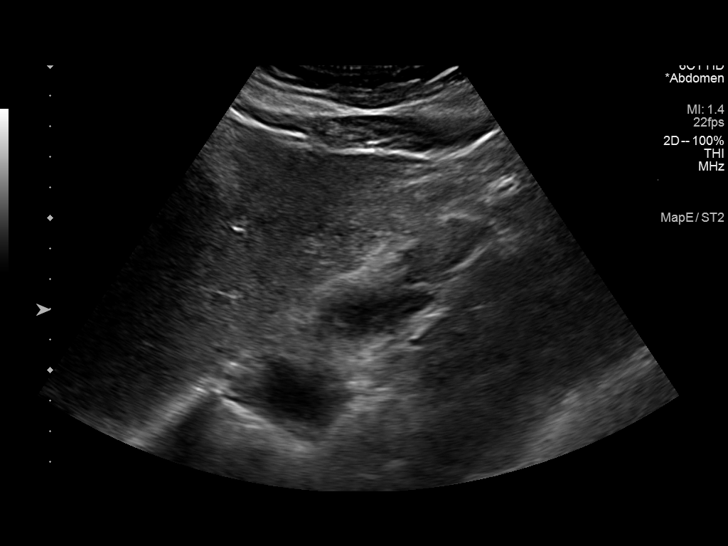
[im 44/95]
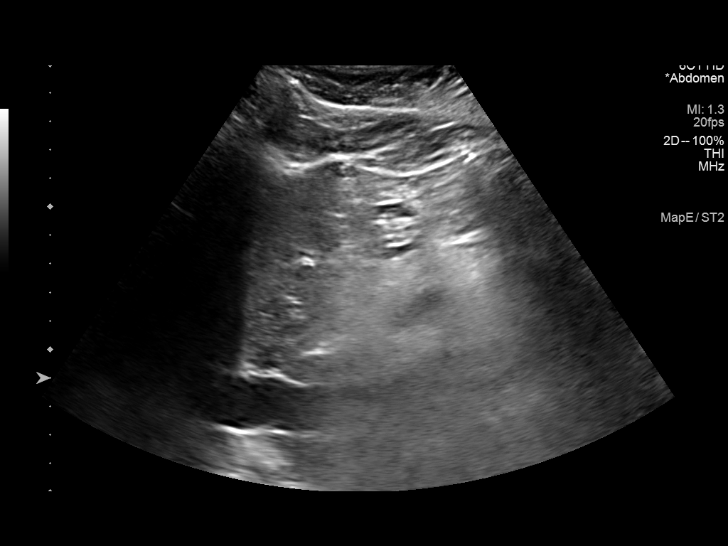
[im 51/95]
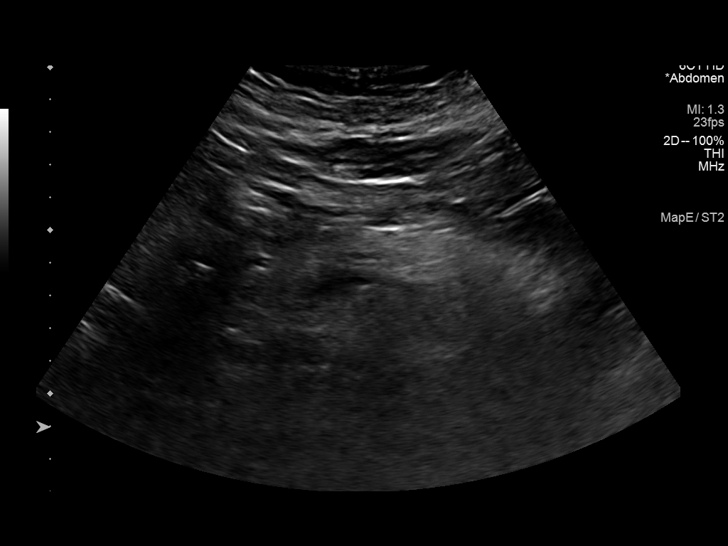
[im 59/95]
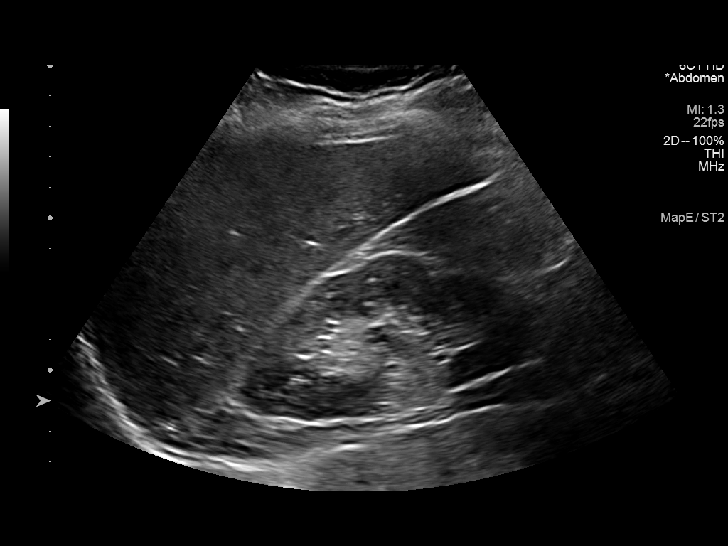
[im 63/95]
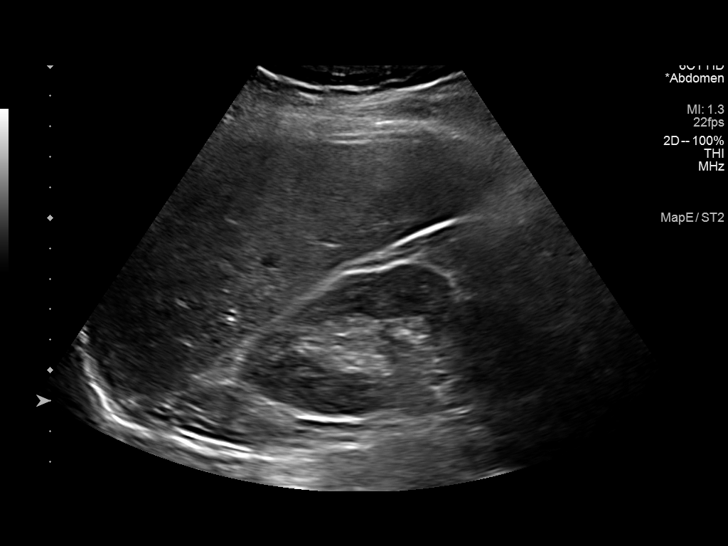
[im 71/95]
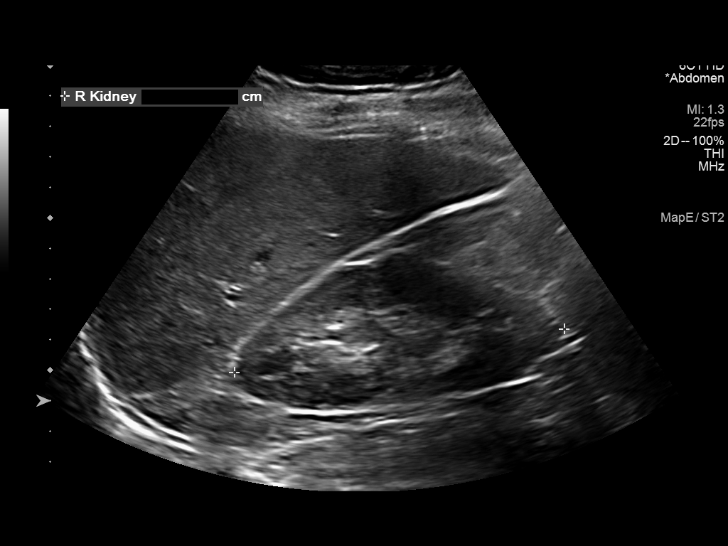
[im 79/95]
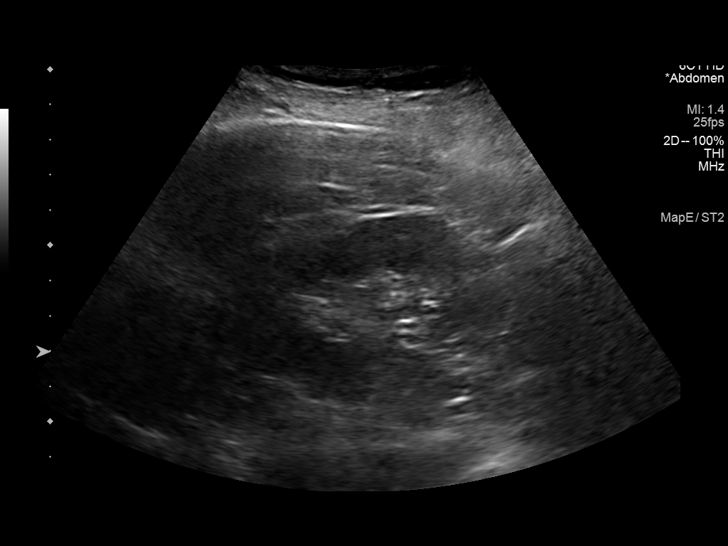
[im 87/95]
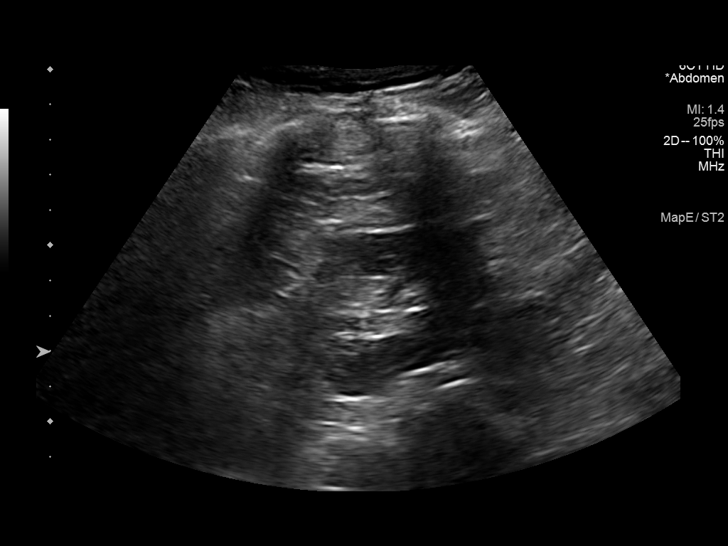
[im 95/95]
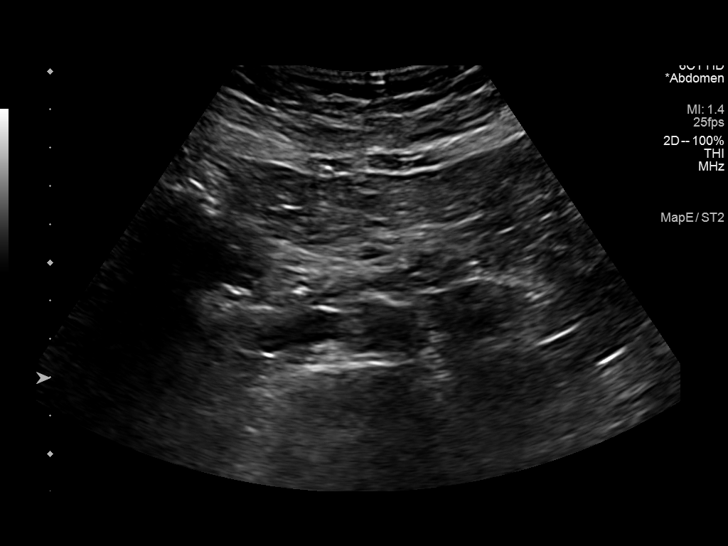

[14 of 25 positions shown; findings below may reference images not displayed]

FINDINGS: Gallbladder: Severe gallbladder wall thickening is noted at 8 mm.
Mild cholelithiasis is noted with sludge within gallbladder lumen.
No sonographic Murphy's sign is noted.

Common bile duct: Diameter: 4 mm which is within normal limits.

Liver: No focal lesion identified. Within normal limits in
parenchymal echogenicity. Portal vein is patent on color Doppler
imaging with normal direction of blood flow towards the liver.

IVC: No abnormality visualized.

Pancreas: Visualized portion unremarkable.

Spleen: Size and appearance within normal limits.

Right Kidney: Length: 10.9 cm. Echogenicity within normal limits. No
mass or hydronephrosis visualized.

Left Kidney: Length: 11.6 cm. Echogenicity within normal limits. No
mass or hydronephrosis visualized.

Abdominal aorta: No aneurysm visualized.

Other findings: None.
IMPRESSION: Severe gallbladder wall thickening is noted with mild cholelithiasis
and sludge within gallbladder lumen. No sonographic Murphy's sign is
noted. HIDA scan may be performed to evaluate for possible
cholecystitis.

## 2022-01-07 ENCOUNTER — Encounter (HOSPITAL_BASED_OUTPATIENT_CLINIC_OR_DEPARTMENT_OTHER): Payer: Self-pay | Admitting: *Deleted

## 2022-01-07 ENCOUNTER — Inpatient Hospital Stay (HOSPITAL_BASED_OUTPATIENT_CLINIC_OR_DEPARTMENT_OTHER)
Admission: EM | Admit: 2022-01-07 | Discharge: 2022-01-10 | DRG: 446 | Disposition: A | Discharge status: Home / Self Care | Payer: Medicare Other | Attending: Internal Medicine | Admitting: Internal Medicine

## 2022-01-07 ENCOUNTER — Other Ambulatory Visit: Payer: Self-pay

## 2022-01-07 DIAGNOSIS — E039 Hypothyroidism, unspecified: Secondary | ICD-10-CM | POA: Diagnosis not present

## 2022-01-07 DIAGNOSIS — Z6832 Body mass index (BMI) 32.0-32.9, adult: Secondary | ICD-10-CM

## 2022-01-07 DIAGNOSIS — Z7983 Long term (current) use of bisphosphonates: Secondary | ICD-10-CM | POA: Diagnosis not present

## 2022-01-07 DIAGNOSIS — K8050 Calculus of bile duct without cholangitis or cholecystitis without obstruction: Secondary | ICD-10-CM

## 2022-01-07 DIAGNOSIS — Z79890 Hormone replacement therapy (postmenopausal): Secondary | ICD-10-CM

## 2022-01-07 DIAGNOSIS — Z881 Allergy status to other antibiotic agents status: Secondary | ICD-10-CM | POA: Diagnosis not present

## 2022-01-07 DIAGNOSIS — Z96653 Presence of artificial knee joint, bilateral: Secondary | ICD-10-CM | POA: Diagnosis present

## 2022-01-07 DIAGNOSIS — J45909 Unspecified asthma, uncomplicated: Secondary | ICD-10-CM | POA: Diagnosis present

## 2022-01-07 DIAGNOSIS — E7800 Pure hypercholesterolemia, unspecified: Secondary | ICD-10-CM | POA: Diagnosis not present

## 2022-01-07 DIAGNOSIS — E669 Obesity, unspecified: Secondary | ICD-10-CM | POA: Diagnosis not present

## 2022-01-07 DIAGNOSIS — Z8719 Personal history of other diseases of the digestive system: Secondary | ICD-10-CM

## 2022-01-07 DIAGNOSIS — I4892 Unspecified atrial flutter: Secondary | ICD-10-CM | POA: Diagnosis present

## 2022-01-07 DIAGNOSIS — Z91041 Radiographic dye allergy status: Secondary | ICD-10-CM

## 2022-01-07 DIAGNOSIS — G250 Essential tremor: Secondary | ICD-10-CM | POA: Diagnosis not present

## 2022-01-07 DIAGNOSIS — K8000 Calculus of gallbladder with acute cholecystitis without obstruction: Secondary | ICD-10-CM | POA: Diagnosis not present

## 2022-01-07 DIAGNOSIS — R0902 Hypoxemia: Secondary | ICD-10-CM | POA: Diagnosis present

## 2022-01-07 DIAGNOSIS — K810 Acute cholecystitis: Principal | ICD-10-CM

## 2022-01-07 DIAGNOSIS — Z9851 Tubal ligation status: Secondary | ICD-10-CM | POA: Diagnosis not present

## 2022-01-07 DIAGNOSIS — I2510 Atherosclerotic heart disease of native coronary artery without angina pectoris: Secondary | ICD-10-CM | POA: Diagnosis present

## 2022-01-07 DIAGNOSIS — Z87442 Personal history of urinary calculi: Secondary | ICD-10-CM | POA: Diagnosis not present

## 2022-01-07 DIAGNOSIS — Z880 Allergy status to penicillin: Secondary | ICD-10-CM

## 2022-01-07 DIAGNOSIS — K219 Gastro-esophageal reflux disease without esophagitis: Secondary | ICD-10-CM | POA: Diagnosis not present

## 2022-01-07 DIAGNOSIS — Z79899 Other long term (current) drug therapy: Secondary | ICD-10-CM

## 2022-01-07 DIAGNOSIS — K819 Cholecystitis, unspecified: Secondary | ICD-10-CM

## 2022-01-07 DIAGNOSIS — Z8249 Family history of ischemic heart disease and other diseases of the circulatory system: Secondary | ICD-10-CM

## 2022-01-07 DIAGNOSIS — R1310 Dysphagia, unspecified: Secondary | ICD-10-CM | POA: Diagnosis present

## 2022-01-07 DIAGNOSIS — K8012 Calculus of gallbladder with acute and chronic cholecystitis without obstruction: Secondary | ICD-10-CM | POA: Diagnosis present

## 2022-01-07 LAB — TSH: TSH: 4.143 u[IU]/mL (ref 0.350–4.500)

## 2022-01-07 LAB — URINALYSIS, ROUTINE W REFLEX MICROSCOPIC
Bilirubin Urine: NEGATIVE
Glucose, UA: NEGATIVE mg/dL
Hgb urine dipstick: NEGATIVE
Nitrite: NEGATIVE
Specific Gravity, Urine: 1.028 (ref 1.005–1.030)
pH: 5.5 (ref 5.0–8.0)

## 2022-01-07 LAB — CBC
HCT: 39.3 % (ref 36.0–46.0)
Hemoglobin: 12.1 g/dL (ref 12.0–15.0)
MCH: 25.5 pg — ABNORMAL LOW (ref 26.0–34.0)
MCHC: 30.8 g/dL (ref 30.0–36.0)
MCV: 82.7 fL (ref 80.0–100.0)
Platelets: 296 10*3/uL (ref 150–400)
RBC: 4.75 MIL/uL (ref 3.87–5.11)
RDW: 17.5 % — ABNORMAL HIGH (ref 11.5–15.5)
WBC: 10.5 10*3/uL (ref 4.0–10.5)
nRBC: 0 % (ref 0.0–0.2)

## 2022-01-07 LAB — COMPREHENSIVE METABOLIC PANEL
ALT: 119 U/L — ABNORMAL HIGH (ref 0–44)
AST: 283 U/L — ABNORMAL HIGH (ref 15–41)
Albumin: 4.2 g/dL (ref 3.5–5.0)
Alkaline Phosphatase: 178 U/L — ABNORMAL HIGH (ref 38–126)
Anion gap: 9 (ref 5–15)
BUN: 10 mg/dL (ref 8–23)
CO2: 27 mmol/L (ref 22–32)
Calcium: 9.7 mg/dL (ref 8.9–10.3)
Chloride: 105 mmol/L (ref 98–111)
Creatinine, Ser: 0.78 mg/dL (ref 0.44–1.00)
GFR, Estimated: >60 mL/min (ref >60)
Glucose, Bld: 116 mg/dL — ABNORMAL HIGH (ref 70–99)
Potassium: 3.9 mmol/L (ref 3.5–5.1)
Sodium: 141 mmol/L (ref 135–145)
Total Bilirubin: 1.8 mg/dL — ABNORMAL HIGH (ref 0.3–1.2)
Total Protein: 7.6 g/dL (ref 6.5–8.1)

## 2022-01-07 LAB — LIPASE, BLOOD: Lipase: 37 U/L (ref 11–51)

## 2022-01-07 MED ORDER — MORPHINE SULFATE (PF) 4 MG/ML IV SOLN
4.0000 mg | Freq: Once | INTRAVENOUS | Status: AC
  Administered 2022-01-07: 4 mg via INTRAVENOUS
  Filled 2022-01-07: qty 1

## 2022-01-07 MED ORDER — PANTOPRAZOLE SODIUM 40 MG PO TBEC
40.0000 mg | DELAYED_RELEASE_TABLET | Freq: Two times a day (BID) | ORAL | Status: DC
  Administered 2022-01-07 – 2022-01-10 (×6): 40 mg via ORAL
  Filled 2022-01-07 (×6): qty 1

## 2022-01-07 MED ORDER — ONDANSETRON HCL 4 MG/2ML IJ SOLN
4.0000 mg | Freq: Once | INTRAMUSCULAR | Status: AC
  Administered 2022-01-07: 4 mg via INTRAVENOUS
  Filled 2022-01-07: qty 2

## 2022-01-07 MED ORDER — MORPHINE SULFATE (PF) 2 MG/ML IV SOLN
2.0000 mg | INTRAVENOUS | Status: DC | PRN
  Administered 2022-01-07: 2 mg via INTRAVENOUS
  Filled 2022-01-07: qty 1

## 2022-01-07 MED ORDER — MORPHINE SULFATE (PF) 4 MG/ML IV SOLN
4.0000 mg | Freq: Once | INTRAVENOUS | Status: AC
Start: 2022-01-07 — End: 2022-01-07
  Administered 2022-01-07: 4 mg via INTRAVENOUS
  Filled 2022-01-07: qty 1

## 2022-01-07 MED ORDER — ONDANSETRON HCL 4 MG PO TABS: 4.0000 mg | ORAL_TABLET | Freq: Four times a day (QID) | ORAL | Status: DC | PRN

## 2022-01-07 MED ORDER — ACETAMINOPHEN 325 MG PO TABS: 650.0000 mg | ORAL_TABLET | Freq: Four times a day (QID) | ORAL | Status: DC | PRN

## 2022-01-07 MED ORDER — LEVOTHYROXINE SODIUM 100 MCG PO TABS
100.0000 ug | ORAL_TABLET | Freq: Every day | ORAL | Status: DC
  Administered 2022-01-08 – 2022-01-10 (×3): 100 ug via ORAL
  Filled 2022-01-07 (×3): qty 1

## 2022-01-07 MED ORDER — ACETAMINOPHEN 650 MG RE SUPP
650.0000 mg | Freq: Four times a day (QID) | RECTAL | Status: DC | PRN
Start: 2022-01-07 — End: 2022-01-10

## 2022-01-07 MED ORDER — ONDANSETRON HCL 4 MG/2ML IJ SOLN: 4.0000 mg | Freq: Four times a day (QID) | INTRAMUSCULAR | Status: DC | PRN

## 2022-01-07 MED ORDER — LACTATED RINGERS IV SOLN: INTRAVENOUS | Status: DC

## 2022-01-07 NOTE — Assessment & Plan Note (Signed)
Cont synthroid 

## 2022-01-07 NOTE — H&P (Signed)
?History and Physical  ? ? ?Patient: Katie Weber VQM:[id number] DOB: 1946/06/22 ?DOA: 01/07/2022 ?DOS: the patient was seen and examined on 01/07/2022 ?PCP: [facility]  ?Patient coming from: Home ? ?Chief Complaint:  ?Chief Complaint  ?Patient presents with  ? Abdominal Pain  ? ?HPI: Katie Weber is a 76 y.o. female with medical history significant of hypothyroidism, diverticulitis. ? ?Pt with RUQ abd pain for past few days.  Went to GI.  [place] suggested inflamed GB and pt sent in to ED. ? ?Pain intermittent for past year but worse since Tues. ?  ?Review of Systems: As mentioned in the history of present illness. All other systems reviewed and are negative. ?Past Medical History:  ?Diagnosis Date  ? Anemia   ? when pt was younger  ? Arthritis   ? arthritis fingers  ? Asthma   ? Coronary artery calcification 04/24/2017  ? Diverticulitis   ? x2 -last 1'17 tx. outpatient.  ? Environmental and seasonal allergies   ? 2014 Possibly related to air freshners.  ? 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  ? Family history of anesthesia complication 93OIZ ago  ? daughter had an allergic reaction to atropine  ? GERD (gastroesophageal reflux disease)   ? no meds  ? History of hiatal hernia 09/14/2014  ? seen on xray  ? History of kidney stones   ? '80's lithotripsy-[facility] x1  ? History of kidney stones   ? x1  ? Hyperlipidemia   ? Hypothyroidism   ? Pneumonia 2014  ? Pure hypercholesterolemia   ? Tremor of left hand   ? ?Past Surgical History:  ?Procedure Laterality Date  ? BALLOON DILATION N/A 05/04/2014  ? Procedure: BALLOON DILATION;  Surgeon: Garlan Fair, MD;  Location: [facility];  Service: Endoscopy;  Laterality: N/A;  ? BREAST LUMPECTOMY Right 1972  ? benign   ? BREAST SURGERY Right   ? '72 -benign tumor  ? childbirth- NVD x2     ? COLONOSCOPY WITH PROPOFOL N/A 05/04/2014  ? Procedure: COLONOSCOPY WITH PROPOFOL;  Surgeon:  Garlan Fair, MD;  Location: [facility];  Service: Endoscopy;  Laterality: N/A;  ? DILATION AND CURETTAGE OF UTERUS    ? 04-20-14 [facility] hospital endometrial polyp removed.  ? ESOPHAGOGASTRODUODENOSCOPY (EGD) WITH PROPOFOL N/A 05/04/2014  ? Procedure: ESOPHAGOGASTRODUODENOSCOPY (EGD) WITH PROPOFOL;  Surgeon: Garlan Fair, MD;  Location: [facility];  Service: Endoscopy;  Laterality: N/A;. Procedure done in [place], [place]- 5 yrs ago.  ? HYSTEROSCOPY WITH D & C N/A 04/20/2014  ? Procedure: DILATATION AND CURETTAGE /HYSTEROSCOPY;  Surgeon: [name], MD;  Location: [facility];  Service: Gynecology;  Laterality: N/A;  ? LITHOTRIPSY    ? POLYPECTOMY  2020  ? Dr. Lear Ng  ? 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 Procedure: LEFT TOTAL KNEE ARTHROPLASTY;  Surgeon: Mcarthur Rossetti, MD;  Location: [facility];  Service: Orthopedics;  Laterality: Left;  ? TOTAL KNEE ARTHROPLASTY Right 08/09/2021  ? Procedure: RIGHT TOTAL KNEE ARTHROPLASTY;  Surgeon: Mcarthur Rossetti, MD;  Location: [facility];  Service: Orthopedics;  Laterality: Right;  Needs RNFA  ? TUBAL LIGATION    ? ?Social History:  reports that she has never smoked. She has never used smokeless tobacco. She reports current alcohol use. She reports that she does not use drugs. ? ?Allergies  ?Allergen Reactions  ? Contrast Media [Iodinated Contrast Media] Shortness Of  Breath and Other (See Comments)  ?  Tightness in chest. Trouble breathing  ? Clindamycin/Lincomycin Nausea Only  ? Penicillins Hives  ?  Marland KitchenMarland KitchenHas patient had a PCN reaction causing immediate rash, facial/tongue/throat swelling, SOB or lightheadedness with hypotension: No ?Has patient had a PCN reaction causing severe rash involving mucus membranes or skin necrosis: No ?Has patient had a PCN reaction that required hospitalization No ?Has patient had a PCN reaction occurring within the last 10 years: No ?If all of the above answers are "NO", then may proceed with Cephalosporin use. ?   ? ? ?Family History  ?Problem Relation Age of Onset  ? Cancer Other   ? Endometrial cancer Mother   ? CAD Father   ? Breast cancer Maternal Aunt   ? ? ?Prior to Admission medications   ?Medication Sig Start Date End Date Taking? Authorizing Provider  ?alendronate (FOSAMAX) 70 MG tablet Take 70 mg by mouth once a week. Take with a full glass of water on an empty stomach. Take on Fridays   Yes [provider]  ?Camphor-Menthol-Methyl Sal (SALONPAS) 3.10-14-08 % PTCH Apply 1 application topically daily as needed (Pain).   Yes [provider]  ?Cholecalciferol (VITAMIN D) 50 MCG (2000 UT) tablet Take 2,000 Units by mouth 3 (three) times a week.   Yes [provider]  ?levothyroxine (SYNTHROID, LEVOTHROID) 100 MCG tablet Take 100 mcg by mouth daily before breakfast.   Yes [provider]  ?ondansetron (ZOFRAN-ODT) 4 MG disintegrating tablet Take 4 mg by mouth every 8 (eight) hours as needed for nausea or vomiting. 01/04/22  Yes [provider]  ?pantoprazole (PROTONIX) 40 MG tablet Take 40 mg by mouth 2 (two) times daily. 01/04/22  Yes [provider]  ?rosuvastatin (CRESTOR) 10 MG tablet Take 10 mg by mouth 2 (two) times a week.   Yes [provider]  ?vitamin B-12 (CYANOCOBALAMIN) 1000 MCG tablet Take 1,000 mcg by mouth 3 (three) times a week.   Yes [provider]  ?vitamin C (ASCORBIC ACID) 500 MG tablet Take 500 mg by mouth 3 (three) times a week.   Yes [provider]  ?zinc gluconate 50 MG tablet Take 50 mg by mouth 3 (three) times a week.   Yes [provider]  ? ? ?Physical Exam: ?Vitals:  ? 01/07/22 2000 01/07/22 2033 01/07/22 2119 01/07/22 2130  ?BP: (!) 173/73 (!) 166/69  (!) 160/77  ?Pulse: 71 75  77  ?Resp: (!) '28 15  20  '$ ?Temp:  99.2 ?F (37.3 ?C)  99.2 ?F (37.3 ?C)  ?SpO2: 94% 95%  93%  ?Weight:   78.1 kg   ?Height:   '5\' 2"'$  (1.575 m)   ? ?Constitutional: NAD, calm, comfortable ?Eyes: PERRL, lids and conjunctivae  normal ?ENMT: Mucous membranes are moist. Posterior pharynx clear of any exudate or lesions.Normal dentition.  ?Neck: normal, supple, no masses, no thyromegaly ?Respiratory: clear to auscultation bilaterally, no wheezing, no crackles. Normal respiratory effort. No accessory muscle use.  ?Cardiovascular: Regular rate and rhythm, no murmurs / rubs / gallops. No extremity edema. 2+ pedal pulses. No carotid bruits.  ?Abdomen: no tenderness, no masses palpated. No hepatosplenomegaly. Bowel sounds positive.  ?Musculoskeletal: no clubbing / cyanosis. No joint deformity upper and lower extremities. Good ROM, no contractures. Normal muscle tone.  ?Skin: no rashes, lesions, ulcers. No induration ?Neurologic: CN 2-12 grossly intact. Sensation intact, DTR normal. Strength 5/5 in all 4.  ?Psychiatric: Normal judgment and insight. Alert and oriented x 3. Normal  mood.  ? ?Data Reviewed: ? ?Work up in ED shows new transaminitis. ?US shows severe gallbladder wall thickening, cholelithiasis, ? Cholecystitis, no sonographic murphy's.  61m CBD. ? ?Assessment and Plan: ?* Biliary colic ?Cholelithiasis, LFT elevation, ? Cholecystitis. ?Duct only 437mon [place]?But will go ahead and order MRCP since there is some question of choledocholithiasis per gen surg. ?NPO ?IVF ?Morphine PRN pain ?Repeat CBC/CMP in AM ?EDP reportedly consulted gen surg and GI ? ?[place] EKG showing new onset A.Flutter, appears to be rate controlled at 4:1 though without any rate meds. ?Tele monitor ?HR 67 ?CHADS Vasc = 3 due to age and gender ?However [facility] on starting [facility] may need surgery for biliary colic ? ?Hypothyroidism ?Cont synthroid. ? ? ? ? ? Advance Care Planning:   Code Status: Full Code ? ?Consults: GI and Dr. WaDonne Hazelgen surg) ? ?Family Communication: None ? ?Severity of Illness: ?The appropriate patient status for this patient is OBSERVATION. Observation status is judged to be reasonable and necessary in order  to provide the required intensity of service to ensure the patient's safety. The patient's presenting symptoms, physical exam findings, and initial radiographic and laboratory data in the context of their medic

## 2022-01-07 NOTE — Assessment & Plan Note (Addendum)
ED EKG showing new onset A.Flutter, appears to be rate controlled at 4:1 though without any rate meds. ?1. Tele monitor ?2. HR 67 ?3. CHADS Vasc = 3 due to age and gender ?4. However will hold off on starting [facility] as pt may need surgery for biliary colic ?

## 2022-01-07 NOTE — Assessment & Plan Note (Signed)
Cholelithiasis, LFT elevation, ? Cholecystitis. ?Duct only 102m on [place] ?1. But will go ahead and order MRCP since there is some question of choledocholithiasis per gen surg. ?2. NPO ?3. IVF ?4. Morphine PRN pain ?5. Repeat CBC/CMP in AM ?6. EDP reportedly consulted gen surg and GI ?

## 2022-01-07 NOTE — ED Triage Notes (Signed)
Pt has had abdominal pain intermittently for the past year but worse since Tuesday.  Pt was seen at GI office and had an outpatient ultrasound and she continues to have increasing abdominal pain and called GI office and on call MD states that her US showed inflamed gallbladder and she should come to the ED ?

## 2022-01-08 ENCOUNTER — Inpatient Hospital Stay (HOSPITAL_COMMUNITY): Payer: Medicare Other

## 2022-01-08 ENCOUNTER — Other Ambulatory Visit: Payer: Self-pay

## 2022-01-08 ENCOUNTER — Observation Stay (HOSPITAL_COMMUNITY): Payer: Medicare Other

## 2022-01-08 DIAGNOSIS — Z6832 Body mass index (BMI) 32.0-32.9, adult: Secondary | ICD-10-CM | POA: Diagnosis not present

## 2022-01-08 DIAGNOSIS — E039 Hypothyroidism, unspecified: Secondary | ICD-10-CM | POA: Diagnosis present

## 2022-01-08 DIAGNOSIS — J45909 Unspecified asthma, uncomplicated: Secondary | ICD-10-CM | POA: Diagnosis present

## 2022-01-08 DIAGNOSIS — R0902 Hypoxemia: Secondary | ICD-10-CM | POA: Diagnosis present

## 2022-01-08 DIAGNOSIS — K810 Acute cholecystitis: Secondary | ICD-10-CM | POA: Diagnosis present

## 2022-01-08 DIAGNOSIS — R1310 Dysphagia, unspecified: Secondary | ICD-10-CM | POA: Diagnosis present

## 2022-01-08 DIAGNOSIS — I4891 Unspecified atrial fibrillation: Secondary | ICD-10-CM | POA: Diagnosis not present

## 2022-01-08 DIAGNOSIS — G250 Essential tremor: Secondary | ICD-10-CM | POA: Diagnosis present

## 2022-01-08 DIAGNOSIS — E7800 Pure hypercholesterolemia, unspecified: Secondary | ICD-10-CM | POA: Diagnosis present

## 2022-01-08 DIAGNOSIS — Z8249 Family history of ischemic heart disease and other diseases of the circulatory system: Secondary | ICD-10-CM | POA: Diagnosis not present

## 2022-01-08 DIAGNOSIS — Z881 Allergy status to other antibiotic agents status: Secondary | ICD-10-CM | POA: Diagnosis not present

## 2022-01-08 DIAGNOSIS — Z880 Allergy status to penicillin: Secondary | ICD-10-CM | POA: Diagnosis not present

## 2022-01-08 DIAGNOSIS — Z8719 Personal history of other diseases of the digestive system: Secondary | ICD-10-CM | POA: Diagnosis not present

## 2022-01-08 DIAGNOSIS — K8050 Calculus of bile duct without cholangitis or cholecystitis without obstruction: Secondary | ICD-10-CM | POA: Diagnosis not present

## 2022-01-08 DIAGNOSIS — K8000 Calculus of gallbladder with acute cholecystitis without obstruction: Secondary | ICD-10-CM | POA: Diagnosis present

## 2022-01-08 DIAGNOSIS — I2510 Atherosclerotic heart disease of native coronary artery without angina pectoris: Secondary | ICD-10-CM | POA: Diagnosis present

## 2022-01-08 DIAGNOSIS — Z79890 Hormone replacement therapy (postmenopausal): Secondary | ICD-10-CM | POA: Diagnosis not present

## 2022-01-08 DIAGNOSIS — Z7983 Long term (current) use of bisphosphonates: Secondary | ICD-10-CM | POA: Diagnosis not present

## 2022-01-08 DIAGNOSIS — K219 Gastro-esophageal reflux disease without esophagitis: Secondary | ICD-10-CM | POA: Diagnosis present

## 2022-01-08 DIAGNOSIS — Z9851 Tubal ligation status: Secondary | ICD-10-CM | POA: Diagnosis not present

## 2022-01-08 DIAGNOSIS — E669 Obesity, unspecified: Secondary | ICD-10-CM | POA: Diagnosis present

## 2022-01-08 DIAGNOSIS — Z87442 Personal history of urinary calculi: Secondary | ICD-10-CM | POA: Diagnosis not present

## 2022-01-08 DIAGNOSIS — Z79899 Other long term (current) drug therapy: Secondary | ICD-10-CM | POA: Diagnosis not present

## 2022-01-08 DIAGNOSIS — Z91041 Radiographic dye allergy status: Secondary | ICD-10-CM | POA: Diagnosis not present

## 2022-01-08 DIAGNOSIS — Z96653 Presence of artificial knee joint, bilateral: Secondary | ICD-10-CM | POA: Diagnosis present

## 2022-01-08 DIAGNOSIS — K8012 Calculus of gallbladder with acute and chronic cholecystitis without obstruction: Secondary | ICD-10-CM | POA: Diagnosis present

## 2022-01-08 HISTORY — PX: IR PERC CHOLECYSTOSTOMY: IMG2326

## 2022-01-08 LAB — CBC
HCT: 40.8 % (ref 36.0–46.0)
Hemoglobin: 12.7 g/dL (ref 12.0–15.0)
MCH: 26.2 pg (ref 26.0–34.0)
MCHC: 31.1 g/dL (ref 30.0–36.0)
MCV: 84.1 fL (ref 80.0–100.0)
Platelets: 298 10*3/uL (ref 150–400)
RBC: 4.85 MIL/uL (ref 3.87–5.11)
RDW: 17.9 % — ABNORMAL HIGH (ref 11.5–15.5)
WBC: 14.3 10*3/uL — ABNORMAL HIGH (ref 4.0–10.5)
nRBC: 0 % (ref 0.0–0.2)

## 2022-01-08 LAB — COMPREHENSIVE METABOLIC PANEL
ALT: 233 U/L — ABNORMAL HIGH (ref 0–44)
AST: 440 U/L — ABNORMAL HIGH (ref 15–41)
Albumin: 3.8 g/dL (ref 3.5–5.0)
Alkaline Phosphatase: 189 U/L — ABNORMAL HIGH (ref 38–126)
Anion gap: 7 (ref 5–15)
BUN: 8 mg/dL (ref 8–23)
CO2: 26 mmol/L (ref 22–32)
Calcium: 9.1 mg/dL (ref 8.9–10.3)
Chloride: 105 mmol/L (ref 98–111)
Creatinine, Ser: 0.72 mg/dL (ref 0.44–1.00)
GFR, Estimated: >60 mL/min (ref >60)
Glucose, Bld: 143 mg/dL — ABNORMAL HIGH (ref 70–99)
Potassium: 3.9 mmol/L (ref 3.5–5.1)
Sodium: 138 mmol/L (ref 135–145)
Total Bilirubin: 2.9 mg/dL — ABNORMAL HIGH (ref 0.3–1.2)
Total Protein: 7.3 g/dL (ref 6.5–8.1)

## 2022-01-08 LAB — LACTIC ACID, PLASMA
Lactic Acid, Venous: 1.5 mmol/L (ref 0.5–1.9)
Lactic Acid, Venous: 1.4 mmol/L (ref 0.5–1.9)

## 2022-01-08 LAB — APTT: aPTT: 30 seconds (ref 24–36)

## 2022-01-08 LAB — LIPASE, BLOOD: Lipase: 38 U/L (ref 11–51)

## 2022-01-08 LAB — TROPONIN I (HIGH SENSITIVITY)
Troponin I (High Sensitivity): 6 ng/L (ref <18)
Troponin I (High Sensitivity): 6 ng/L (ref <18)

## 2022-01-08 LAB — PROTIME-INR
INR: 1.3 — ABNORMAL HIGH (ref 0.8–1.2)
Prothrombin Time: 15.9 seconds — ABNORMAL HIGH (ref 11.4–15.2)

## 2022-01-08 IMAGING — DX DG CHEST 1V PORT
1 series · 1 of 1 positions shown · non-contrast
Comparison: [DATE]

CLINICAL DATA: Dyspnea

EXAM:
PORTABLE CHEST 1 VIEW

[chest ap]
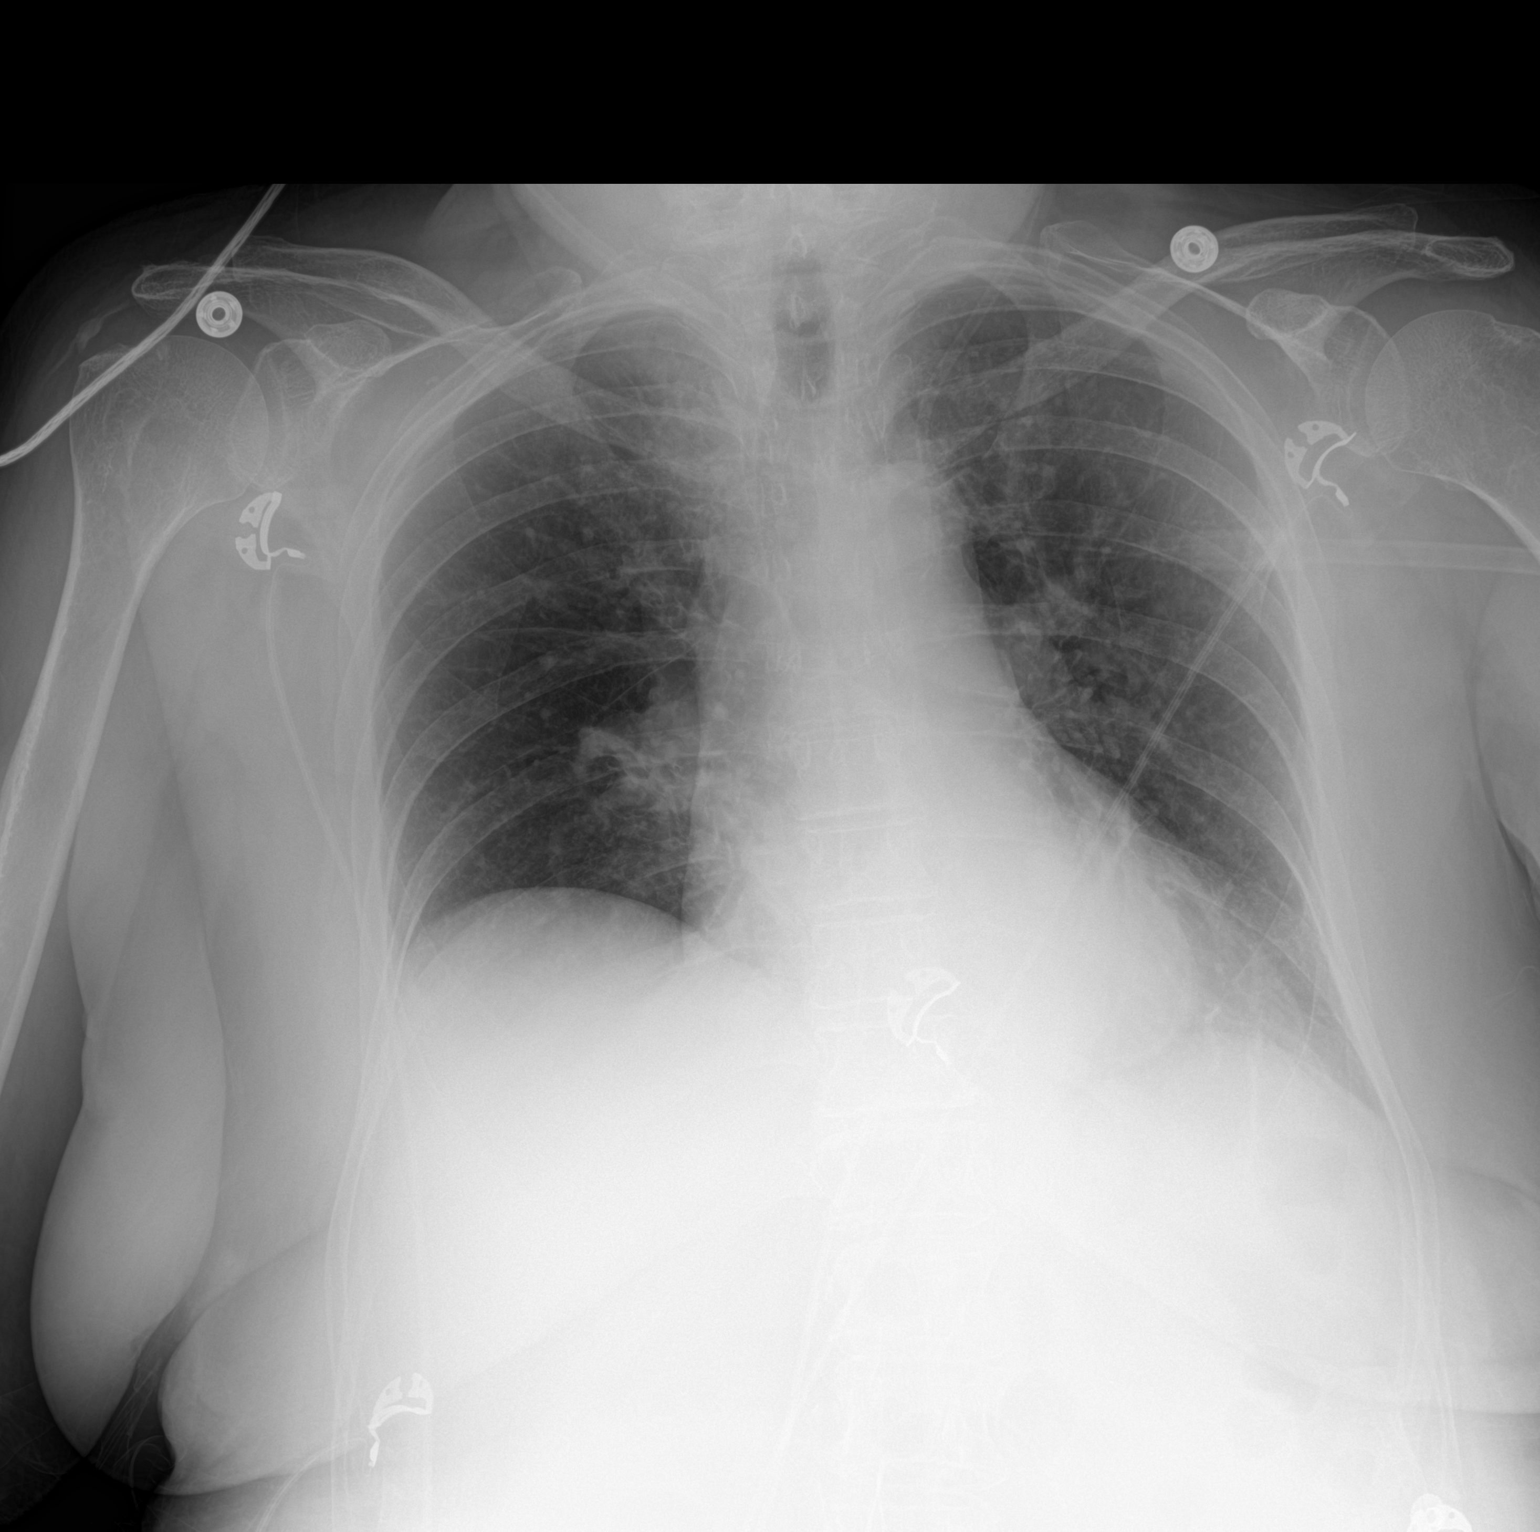

[1 of 1 positions shown; findings below may reference images not displayed]

FINDINGS: Lungs are clear. No pneumothorax or pleural effusion. Moderate
hiatal hernia. Cardiac size within normal limits. Pulmonary
vascularity is normal. No acute bone abnormality.
IMPRESSION: No radiographic evidence of acute cardiopulmonary disease.

Moderate hiatal hernia.

## 2022-01-08 IMAGING — MR MR MRCP
9 of 14 series · 31 of 48 positions shown · non-contrast
Comparison: Ultrasound exam [DATE].  CT scan [DATE]

CLINICAL DATA: Cholelithiasis.

EXAM:
MRI ABDOMEN WITHOUT CONTRAST  (INCLUDING MRCP)
TECHNIQUE: Multiplanar multisequence MR imaging of the abdomen was performed.
Heavily T2-weighted images of the biliary and pancreatic ducts were
obtained, and three-dimensional MRCP images were rendered by post
processing.

[Series 3: T2 fat-sat · axial · 6.0mm · 1.25mm/px · z∈[-87,+165]mm · 2 of 36 slices shown]
[im 1/36]
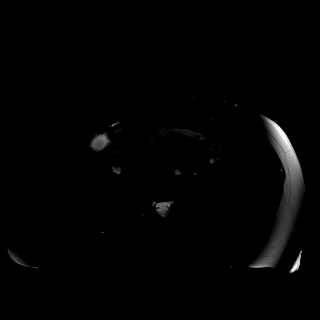
[im 36/36]
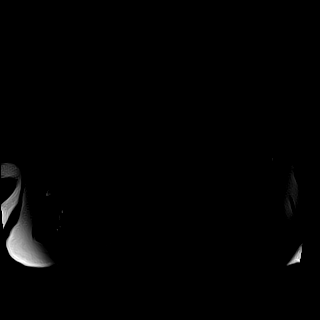

[Series 6: T2 · coronal · 6.0mm · 1.48mm/px · 2 of 30 slices shown]
[im 1/30]
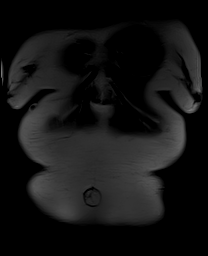
[im 30/30]
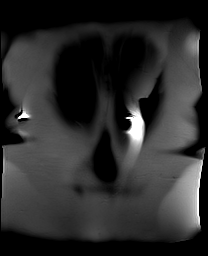

[Series 7: DWI · axial · 6.0mm · 1.49mm/px · z∈[-87,+165]mm · 6 of 72 slices shown (1 of 2)]
[im 1/72]
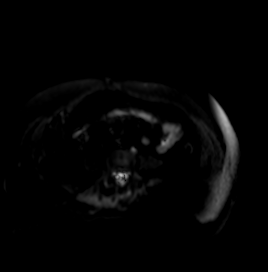
[im 15/72]
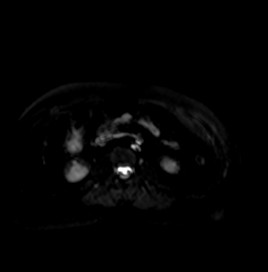
[im 29/72]
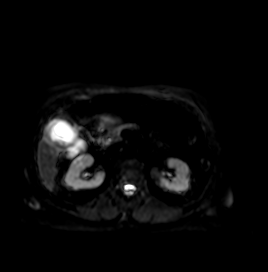
[im 43/72]
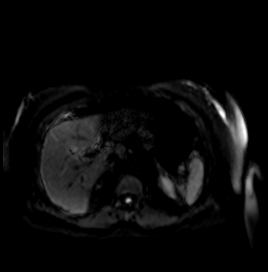
[im 57/72]
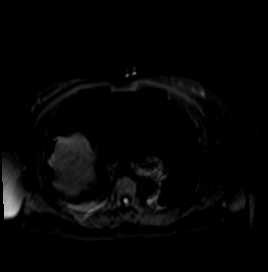
[im 72/72]
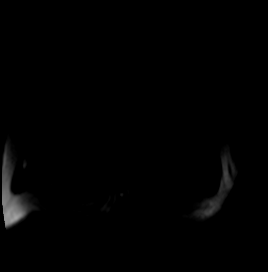

[Series 8: DWI · axial · 6.0mm · 1.49mm/px · z∈[-87,+165]mm · 3 of 36 slices shown (2 of 2)]
[im 1/36]
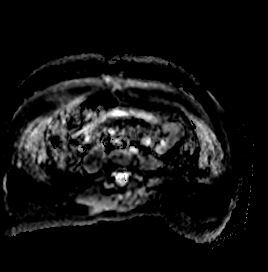
[im 18/36]
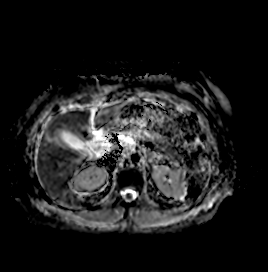
[im 36/36]
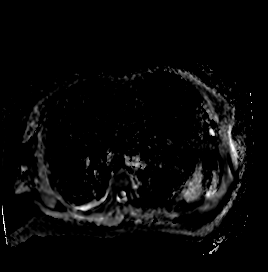

[Series 9: T1 · axial · 3.0mm · 1.25mm/px · z∈[-73,+164]mm · 6 of 80 slices shown (1 of 2)]
[im 1/80]
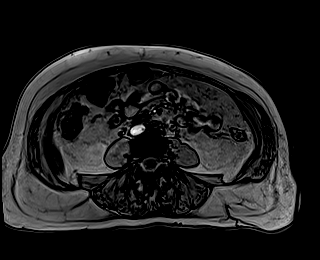
[im 16/80]
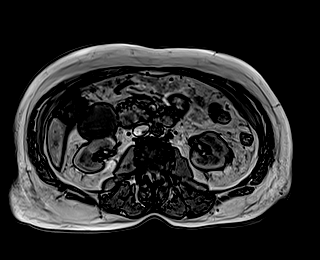
[im 32/80]
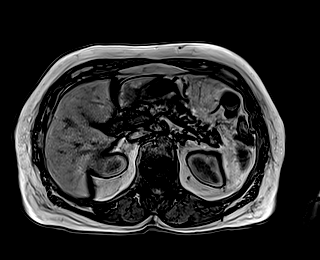
[im 48/80]
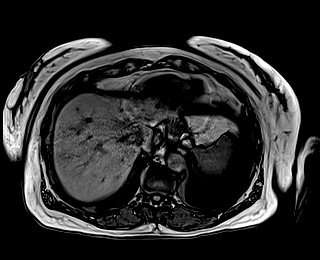
[im 64/80]
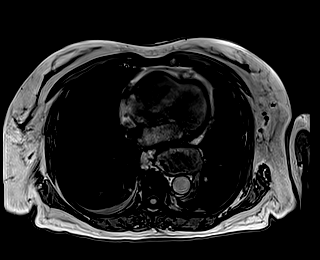
[im 80/80]
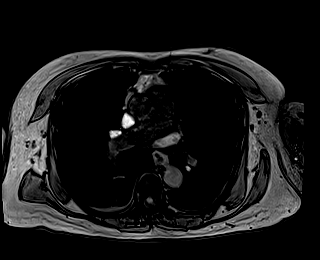

[Series 10: T1 · axial · 3.0mm · 1.25mm/px · z∈[-73,+164]mm · 6 of 80 slices shown (2 of 2)]
[im 1/80]
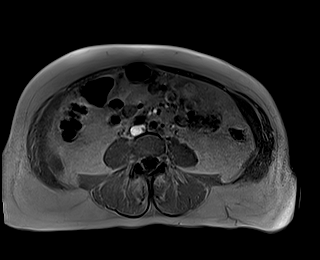
[im 16/80]
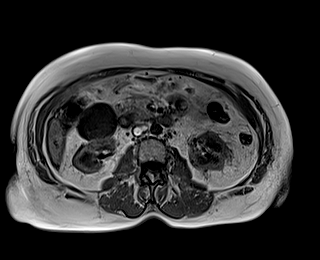
[im 32/80]
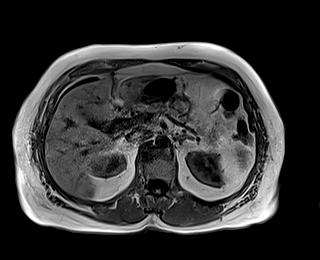
[im 48/80]
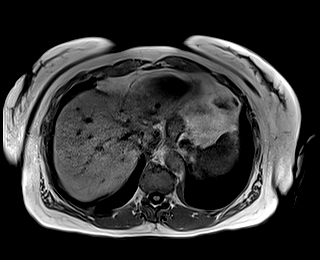
[im 64/80]
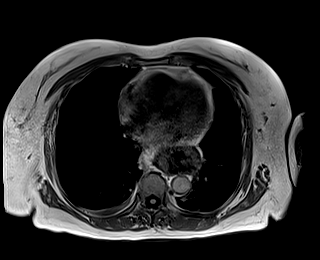
[im 80/80]
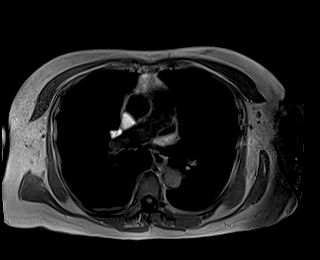

[Series 11: cor obl thk · sagittal · 50.0mm · 0.78mm/px · 1 of 9 slices shown]
[im 1/9]
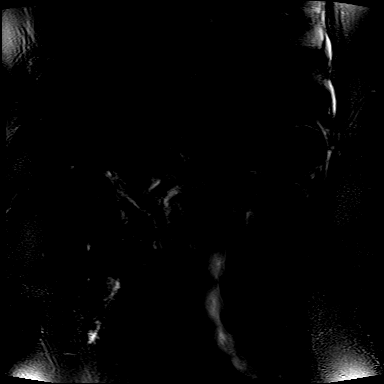

[Series 12: cor_3d_spc_trig-resp · 1 of 8 slices shown]
[im 1/8]
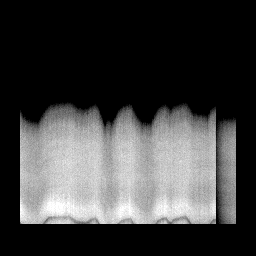

[Series 13: cor_3d_spc_trig · coronal · 1.0mm · 0.49mm/px · 4 of 72 slices shown]
[im 1/72]
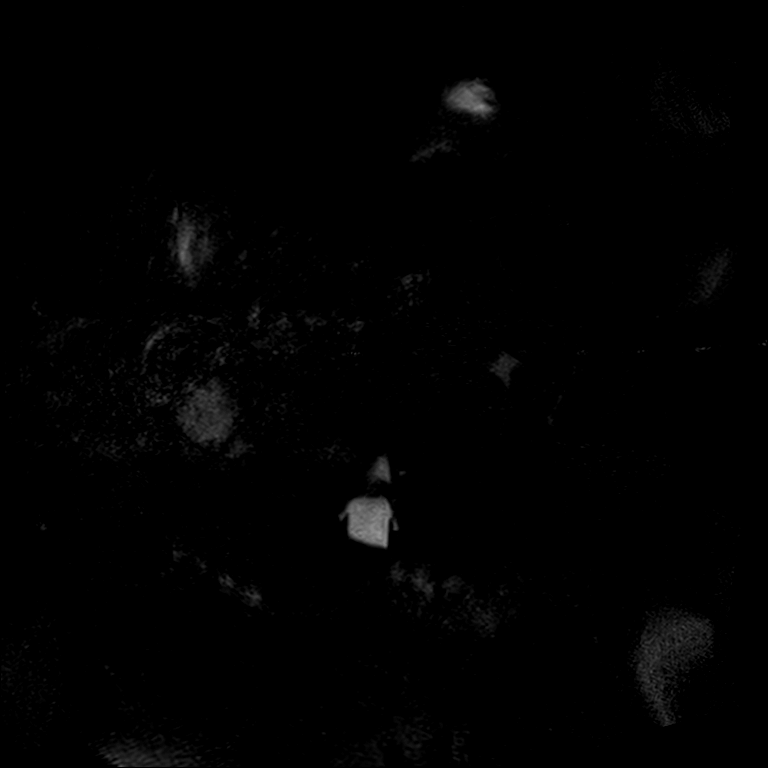
[im 15/72]
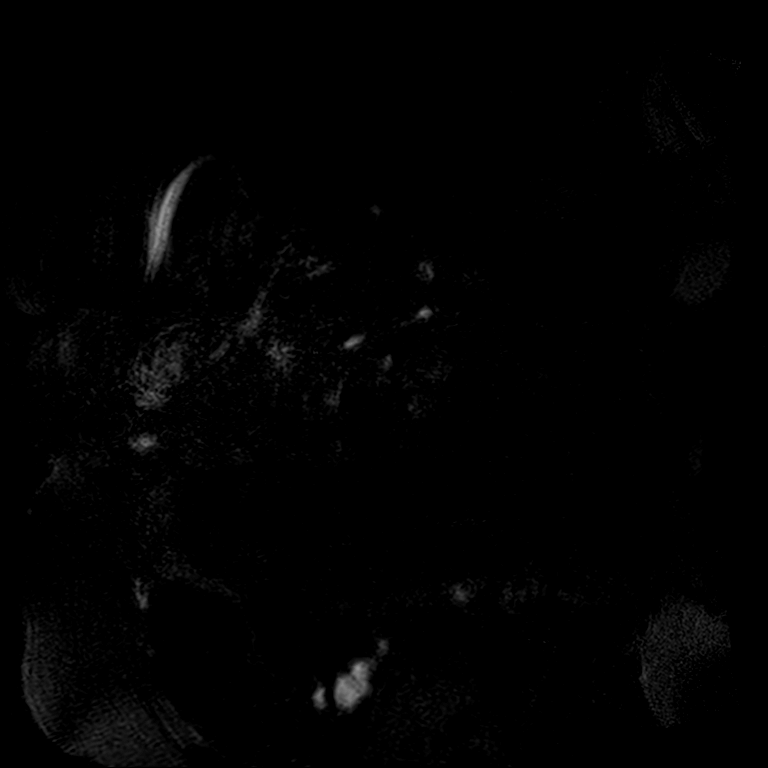
[im 29/72]
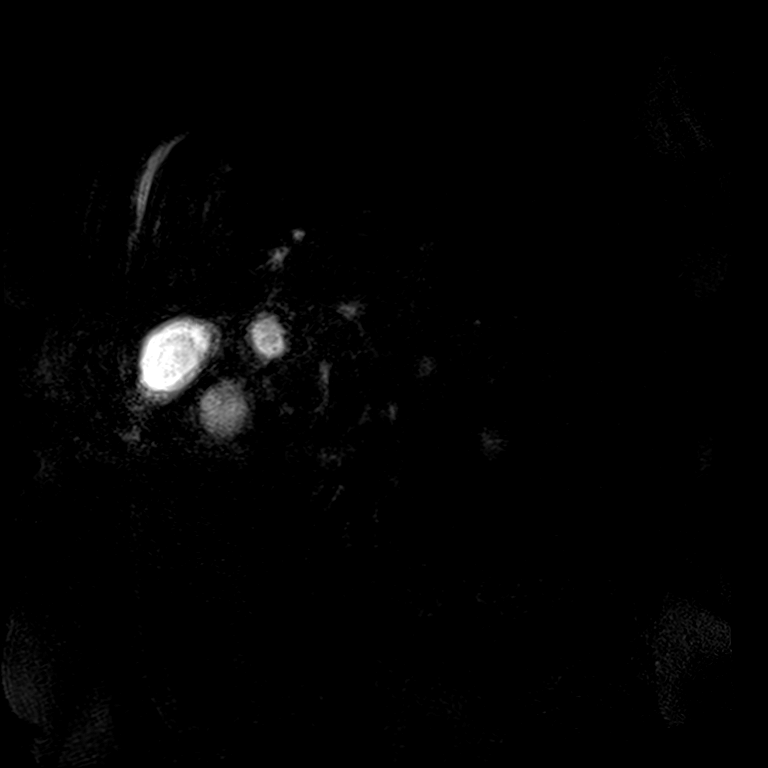
[im 43/72]
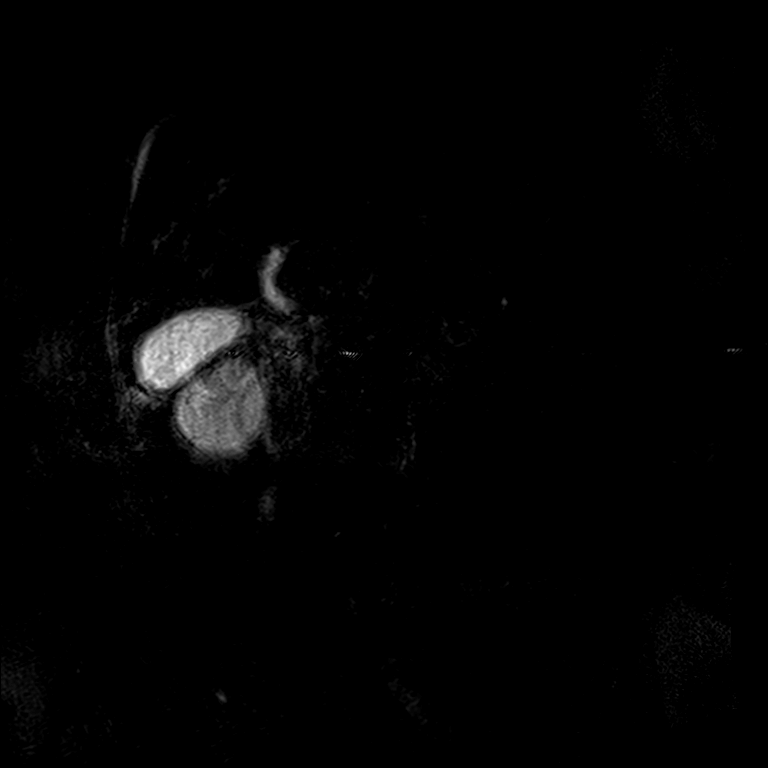

[31 of 48 positions shown; findings below may reference images not displayed]

FINDINGS: Lower chest: Unremarkable.

Hepatobiliary: Tiny T2 hyperintensity in the dome of the left liver
corresponds to a similar tiny hypodensity on previous CT. This is
most suggestive of a tiny hepatic cyst. No follow-up recommended.
Gallbladder is distended with gallbladder wall thickening and
pericholecystic edema. Layering tiny gallstones evident measuring in
the 3-5 mm size range. No intrahepatic or extrahepatic biliary
dilation. No choledocholithiasis

Pancreas: No focal mass lesion. No dilatation of the main duct. No
intraparenchymal cyst. No peripancreatic edema.

Spleen:  No splenomegaly. No focal mass lesion.

Adrenals/Urinary Tract: No adrenal nodule or mass. Left kidney
unremarkable. 5.3 cm simple exophytic cyst noted interpolar right
kidney, stable since prior CT. No followup recommended.

Stomach/Bowel: Moderate to large hiatal hernia. Duodenum is normally
positioned as is the ligament of Treitz. No small bowel or colonic
dilatation within the visualized abdomen.

Vascular/Lymphatic: No abdominal aortic aneurysm. No abdominal
lymphadenopathy

Other:  Trace free fluid adjacent to the liver.

Musculoskeletal: No suspicious marrow signal abnormality.
IMPRESSION: 1. Cholelithiasis with gallbladder wall thickening and
pericholecystic edema. Imaging features compatible with acute
cholecystitis.
2. No intrahepatic or extrahepatic biliary dilation. No
choledocholithiasis.
3. Moderate to large hiatal hernia.

## 2022-01-08 IMAGING — US IR CHOLECYSTOSTOMY
1 series · 1 of 1 positions shown · non-contrast
Comparison: none

CLINICAL DATA: Cholelithiasis, acute cholecystitis

EXAM:
PERCUTANEOUS CHOLECYSTOSTOMY TUBE PLACEMENT WITH ULTRASOUND AND
FLUOROSCOPIC GUIDANCE
FLUOROSCOPY:
Radiation Exposure Index (as provided by the fluoroscopic device): 8
mGy air Kerma
TECHNIQUE: The procedure, risks (including but not limited to bleeding,
infection, organ damage ), benefits, and alternatives were explained
to the patient. Questions regarding the procedure were encouraged
and answered. The patient understands and consents to the procedure.
Survey ultrasound of the abdomen was performed and an appropriate
skin entry site was identified. Skin site was marked, prepped with
Betadine, and draped in usual sterile fashion, and infiltrated
locally with 1% lidocaine.

[Series 1: ir cholecystostomy · 1 of 1 slices shown]
[im 1/1]
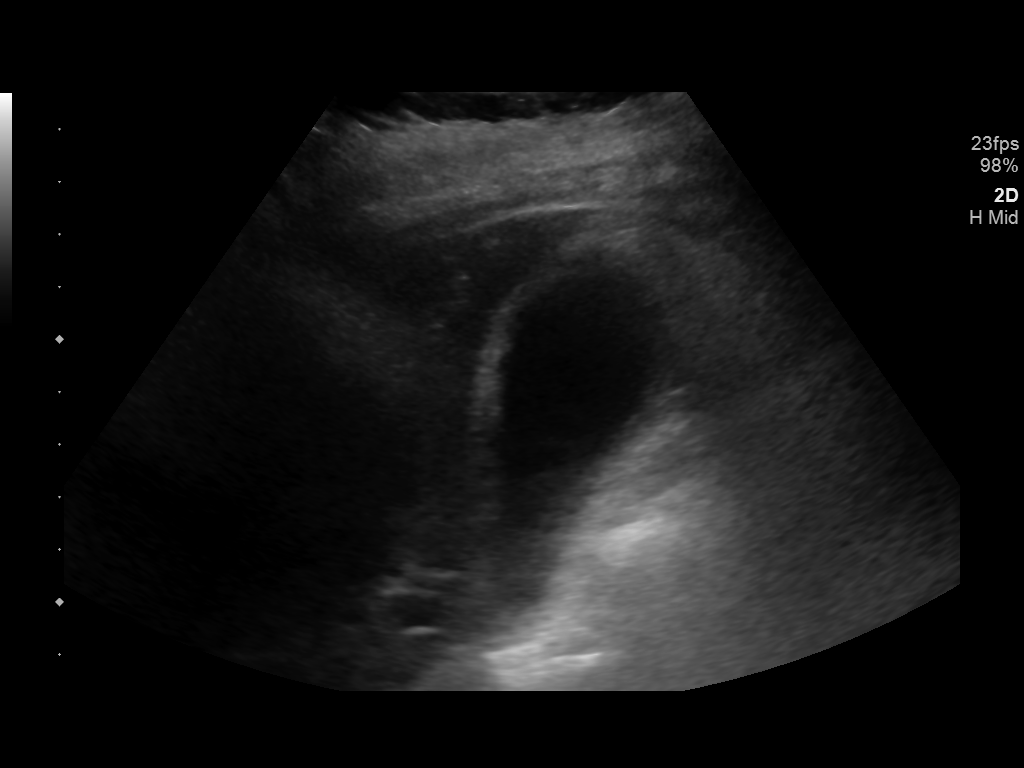

[1 of 1 positions shown; findings below may reference images not displayed]

Intravenous Fentanyl [BZ] and Versed 1mg were administered as
conscious sedation during continuous monitoring of the patient's
level of consciousness and physiological / cardiorespiratory status
by the radiology RN, with a total moderate sedation time of 10
minutes. Under real-time ultrasound guidance, gallbladder was
accessed using a transhepatic approach with a 21-gauge needle.
Ultrasound image documentation was saved. Bile returned through the
hub. Needle was exchanged over a 018 guidewire for transitional
dilator which allowed placement of 035 J wire. Over this, a
French pigtail catheter was advanced and formed centrally in the
gallbladder lumen. Small contrast injection confirmed appropriate
position. Catheter secured externally with 0 Prolene suture and
placed external drain bag. Patient tolerated the procedure well.

COMPLICATIONS:
COMPLICATIONS
none
IMPRESSION: 1. Technically successful percutaneous cholecystostomy tube
placement with ultrasound and fluoroscopic guidance.

## 2022-01-08 IMAGING — MR MR 3D RECON AT SCANNER
8 of 13 series · 31 of 48 positions shown · non-contrast
Comparison: Ultrasound exam [DATE].  CT scan [DATE]

CLINICAL DATA: Cholelithiasis.

EXAM:
MRI ABDOMEN WITHOUT CONTRAST  (INCLUDING MRCP)
TECHNIQUE: Multiplanar multisequence MR imaging of the abdomen was performed.
Heavily T2-weighted images of the biliary and pancreatic ducts were
obtained, and three-dimensional MRCP images were rendered by post
processing.

[Series 3: T2 fat-sat · axial · 6.0mm · 1.25mm/px · z∈[-87,+165]mm · 3 of 36 slices shown]
[im 1/36]
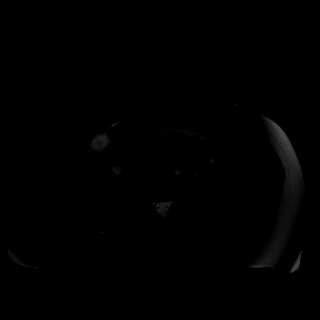
[im 18/36]
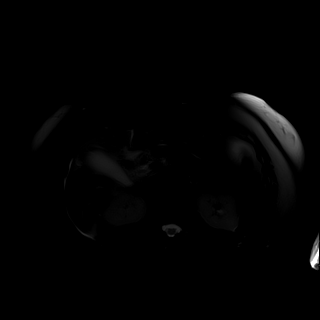
[im 36/36]
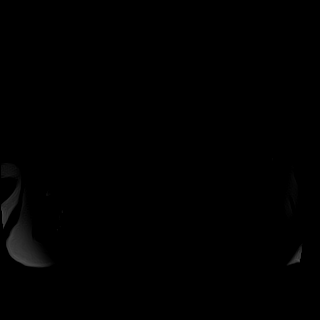

[Series 6: T2 · coronal · 6.0mm · 1.48mm/px · 2 of 30 slices shown]
[im 1/30]
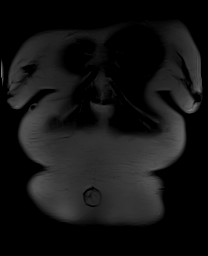
[im 30/30]
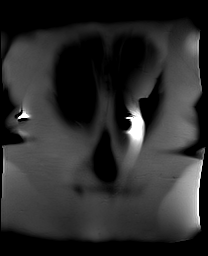

[Series 7: DWI · axial · 6.0mm · 1.49mm/px · z∈[-87,+165]mm · 6 of 72 slices shown (1 of 2)]
[im 1/72]
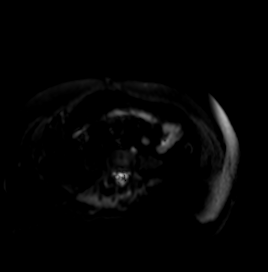
[im 15/72]
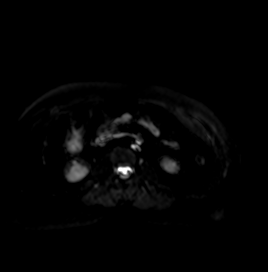
[im 29/72]
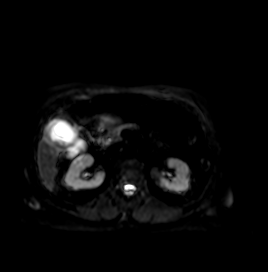
[im 43/72]
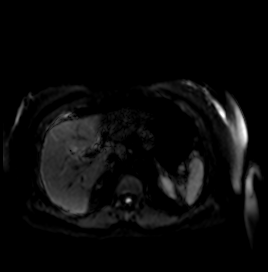
[im 57/72]
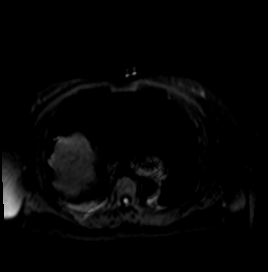
[im 72/72]
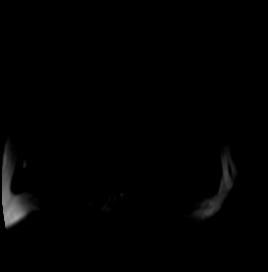

[Series 8: DWI · axial · 6.0mm · 1.49mm/px · z∈[-87,+165]mm · 3 of 36 slices shown (2 of 2)]
[im 1/36]
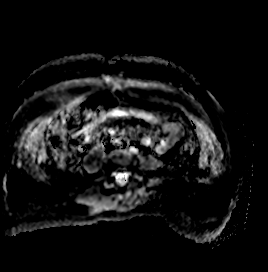
[im 18/36]
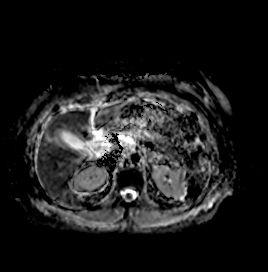
[im 36/36]
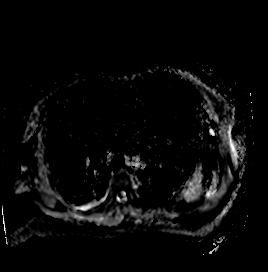

[Series 9: T1 · axial · 3.0mm · 1.25mm/px · z∈[-73,+164]mm · 6 of 80 slices shown (1 of 2)]
[im 1/80]
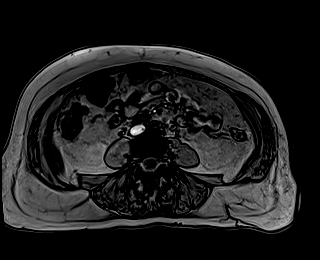
[im 16/80]
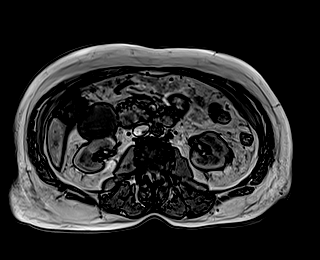
[im 32/80]
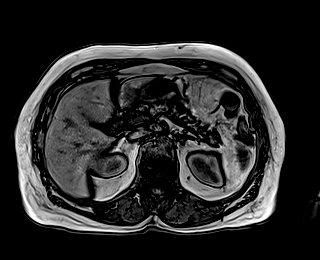
[im 48/80]
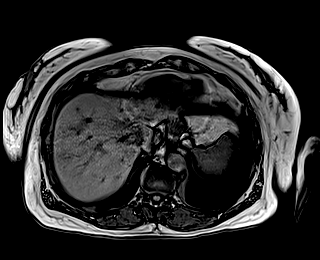
[im 64/80]
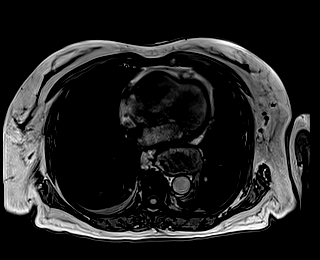
[im 80/80]
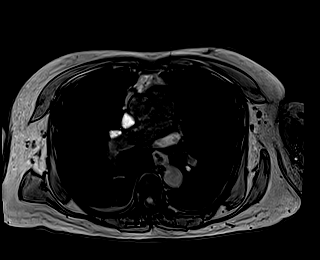

[Series 10: T1 · axial · 3.0mm · 1.25mm/px · z∈[-73,+164]mm · 6 of 80 slices shown (2 of 2)]
[im 1/80]
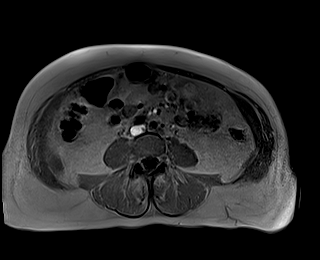
[im 16/80]
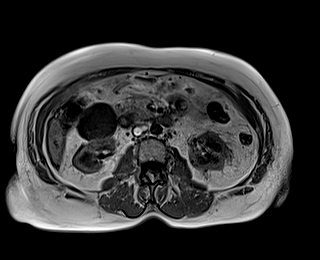
[im 32/80]
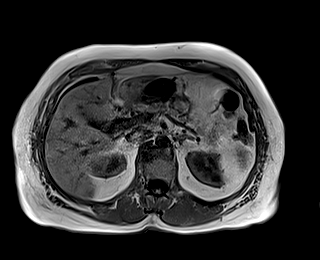
[im 48/80]
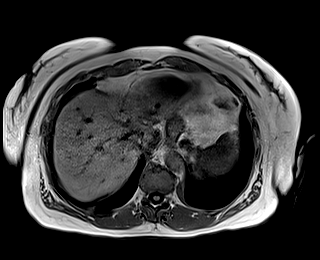
[im 64/80]
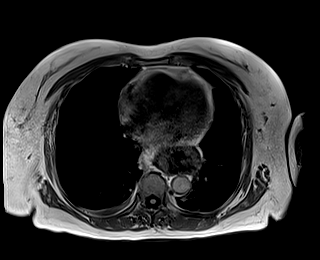
[im 80/80]
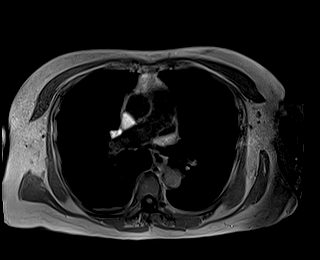

[Series 11: cor obl thk · sagittal · 50.0mm · 0.78mm/px · 1 of 9 slices shown]
[im 1/9]
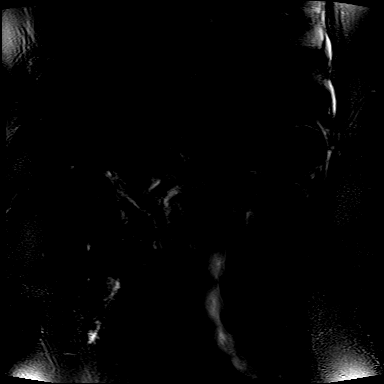

[Series 13: cor_3d_spc_trig · coronal · 1.0mm · 0.49mm/px · 4 of 72 slices shown]
[im 1/72]
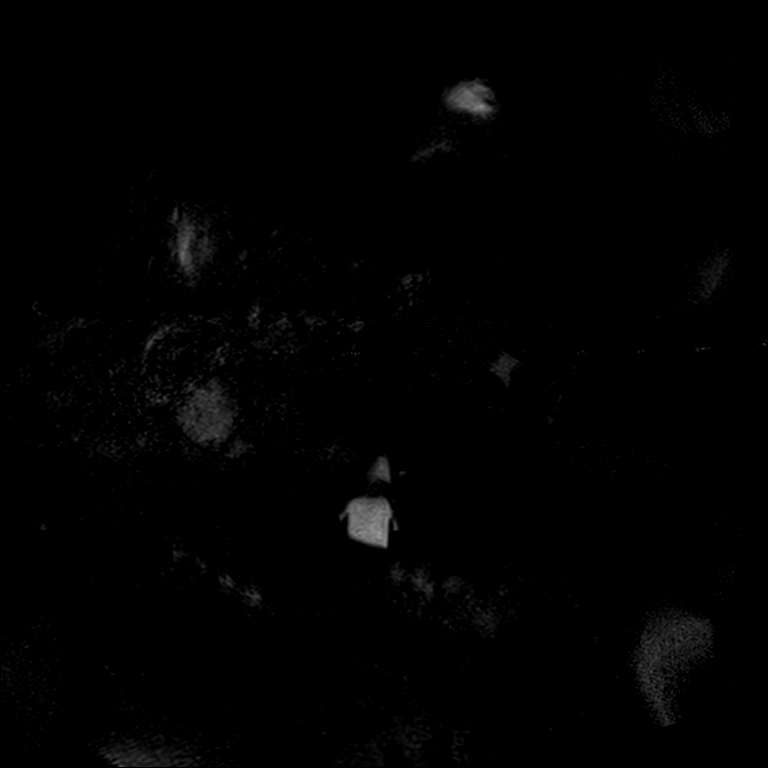
[im 15/72]
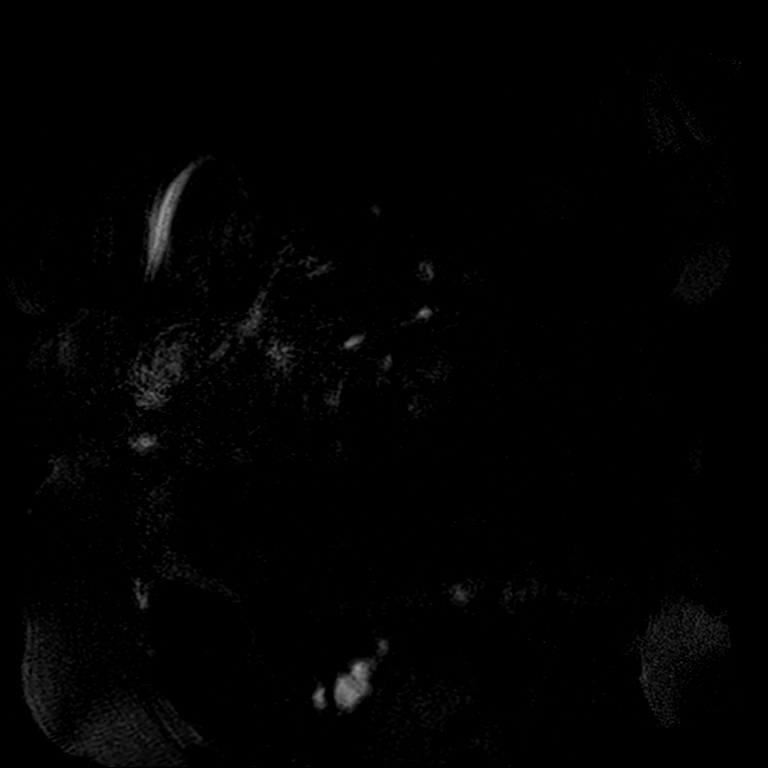
[im 29/72]
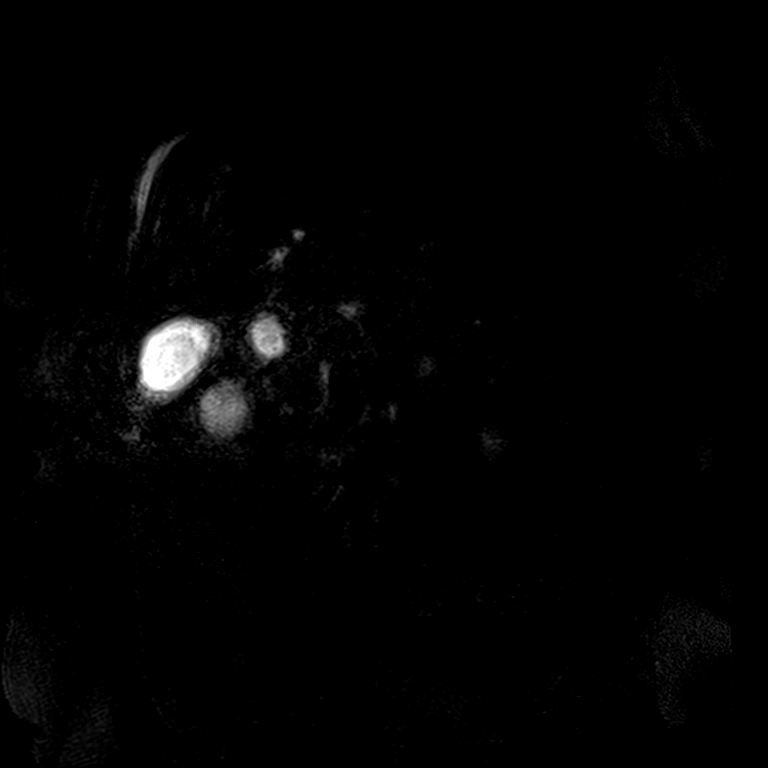
[im 43/72]
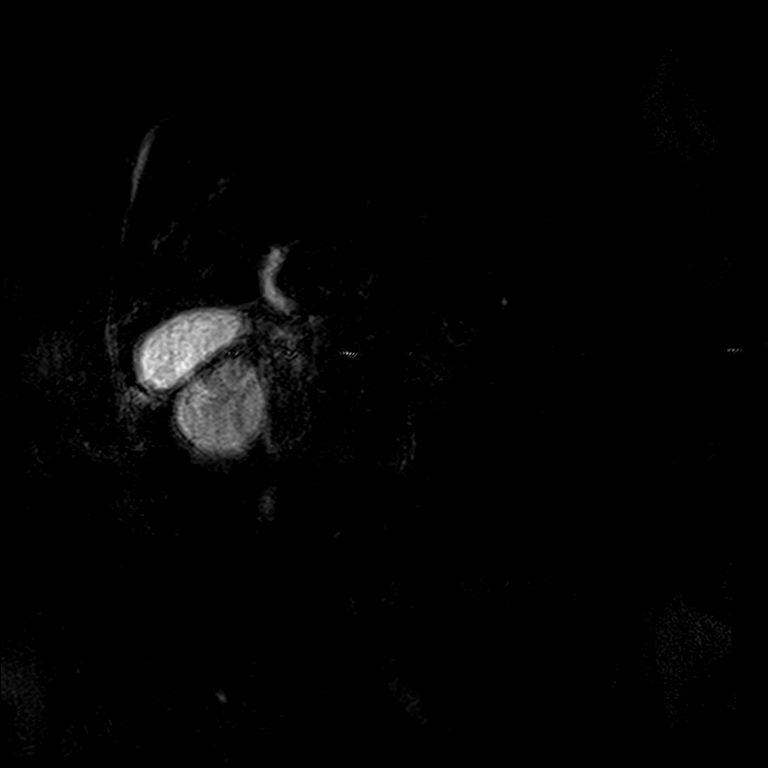

[31 of 48 positions shown; findings below may reference images not displayed]

FINDINGS: Lower chest: Unremarkable.

Hepatobiliary: Tiny T2 hyperintensity in the dome of the left liver
corresponds to a similar tiny hypodensity on previous CT. This is
most suggestive of a tiny hepatic cyst. No follow-up recommended.
Gallbladder is distended with gallbladder wall thickening and
pericholecystic edema. Layering tiny gallstones evident measuring in
the 3-5 mm size range. No intrahepatic or extrahepatic biliary
dilation. No choledocholithiasis

Pancreas: No focal mass lesion. No dilatation of the main duct. No
intraparenchymal cyst. No peripancreatic edema.

Spleen:  No splenomegaly. No focal mass lesion.

Adrenals/Urinary Tract: No adrenal nodule or mass. Left kidney
unremarkable. 5.3 cm simple exophytic cyst noted interpolar right
kidney, stable since prior CT. No followup recommended.

Stomach/Bowel: Moderate to large hiatal hernia. Duodenum is normally
positioned as is the ligament of Treitz. No small bowel or colonic
dilatation within the visualized abdomen.

Vascular/Lymphatic: No abdominal aortic aneurysm. No abdominal
lymphadenopathy

Other:  Trace free fluid adjacent to the liver.

Musculoskeletal: No suspicious marrow signal abnormality.
IMPRESSION: 1. Cholelithiasis with gallbladder wall thickening and
pericholecystic edema. Imaging features compatible with acute
cholecystitis.
2. No intrahepatic or extrahepatic biliary dilation. No
choledocholithiasis.
3. Moderate to large hiatal hernia.

## 2022-01-08 MED ORDER — METRONIDAZOLE 500 MG/100ML IV SOLN
500.0000 mg | Freq: Two times a day (BID) | INTRAVENOUS | Status: DC
Start: 2022-01-08 — End: 2022-01-08
  Administered 2022-01-08: 500 mg via INTRAVENOUS
  Filled 2022-01-08: qty 100

## 2022-01-08 MED ORDER — METOPROLOL TARTRATE 5 MG/5ML IV SOLN: 2.5000 mg | Freq: Three times a day (TID) | INTRAVENOUS | Status: DC | PRN

## 2022-01-08 MED ORDER — METHYLPREDNISOLONE SODIUM SUCC 40 MG IJ SOLR
40.0000 mg | Freq: Once | INTRAMUSCULAR | Status: AC
  Administered 2022-01-08: 40 mg via INTRAVENOUS
  Filled 2022-01-08: qty 1

## 2022-01-08 MED ORDER — HEPARIN (PORCINE) 25000 UT/250ML-% IV SOLN
1000.0000 [IU]/h | INTRAVENOUS | Status: DC
  Administered 2022-01-08: 1000 [IU]/h via INTRAVENOUS
  Filled 2022-01-08: qty 250

## 2022-01-08 MED ORDER — LIDOCAINE HCL 1 % IJ SOLN
INTRAMUSCULAR | Status: AC
  Filled 2022-01-08: qty 20

## 2022-01-08 MED ORDER — MEROPENEM 1 G IV SOLR
1.0000 g | Freq: Three times a day (TID) | INTRAVENOUS | Status: DC
  Administered 2022-01-08 – 2022-01-10 (×6): 1 g via INTRAVENOUS
  Filled 2022-01-08 (×4): qty 20
  Filled 2022-01-08: qty 1
  Filled 2022-01-08 (×3): qty 20

## 2022-01-08 MED ORDER — MIDAZOLAM HCL 2 MG/2ML IJ SOLN
INTRAMUSCULAR | Status: AC
  Filled 2022-01-08: qty 2

## 2022-01-08 MED ORDER — FENTANYL CITRATE (PF) 100 MCG/2ML IJ SOLN
INTRAMUSCULAR | Status: AC | PRN
  Administered 2022-01-08: 50 ug via INTRAVENOUS

## 2022-01-08 MED ORDER — MIDAZOLAM HCL 2 MG/2ML IJ SOLN
INTRAMUSCULAR | Status: AC | PRN
  Administered 2022-01-08: 1 mg via INTRAVENOUS

## 2022-01-08 MED ORDER — HYDROCODONE-ACETAMINOPHEN 5-325 MG PO TABS
1.0000 | ORAL_TABLET | ORAL | Status: DC | PRN
  Administered 2022-01-09: 2 via ORAL
  Administered 2022-01-09: 1 via ORAL
  Administered 2022-01-09 – 2022-01-10 (×2): 2 via ORAL
  Filled 2022-01-08: qty 1
  Filled 2022-01-08 (×3): qty 2

## 2022-01-08 MED ORDER — ORAL CARE MOUTH RINSE
15.0000 mL | Freq: Two times a day (BID) | OROMUCOSAL | Status: DC
  Administered 2022-01-08 – 2022-01-10 (×5): 15 mL via OROMUCOSAL

## 2022-01-08 MED ORDER — IOHEXOL 300 MG/ML  SOLN
50.0000 mL | Freq: Once | INTRAMUSCULAR | Status: AC | PRN
  Administered 2022-01-08: 5 mL

## 2022-01-08 MED ORDER — DIPHENHYDRAMINE HCL 50 MG/ML IJ SOLN
12.5000 mg | Freq: Three times a day (TID) | INTRAMUSCULAR | Status: DC | PRN
  Administered 2022-01-08 – 2022-01-10 (×2): 12.5 mg via INTRAVENOUS
  Filled 2022-01-08 (×2): qty 1

## 2022-01-08 MED ORDER — LIDOCAINE HCL 1 % IJ SOLN
INTRAMUSCULAR | Status: AC | PRN
  Administered 2022-01-08: 10 mL

## 2022-01-08 MED ORDER — MORPHINE SULFATE (PF) 2 MG/ML IV SOLN
2.0000 mg | INTRAVENOUS | Status: DC | PRN
  Administered 2022-01-08 (×5): 2 mg via INTRAVENOUS
  Administered 2022-01-08: 4 mg via INTRAVENOUS
  Administered 2022-01-08: 2 mg via INTRAVENOUS
  Filled 2022-01-08: qty 1
  Filled 2022-01-08: qty 2
  Filled 2022-01-08 (×2): qty 1
  Filled 2022-01-08: qty 2
  Filled 2022-01-08: qty 1
  Filled 2022-01-08: qty 2

## 2022-01-08 MED ORDER — SODIUM CHLORIDE 0.9% FLUSH
5.0000 mL | Freq: Three times a day (TID) | INTRAVENOUS | Status: DC
  Administered 2022-01-09 – 2022-01-10 (×5): 5 mL

## 2022-01-08 MED ORDER — CIPROFLOXACIN IN D5W 400 MG/200ML IV SOLN
400.0000 mg | Freq: Two times a day (BID) | INTRAVENOUS | Status: DC
Start: 2022-01-08 — End: 2022-01-08
  Administered 2022-01-08: 400 mg via INTRAVENOUS
  Filled 2022-01-08: qty 200

## 2022-01-08 MED ORDER — FENTANYL CITRATE (PF) 100 MCG/2ML IJ SOLN
INTRAMUSCULAR | Status: AC
  Filled 2022-01-08: qty 2

## 2022-01-08 NOTE — Significant Event (Addendum)
R side pain with radiation to R shoulder.  Described as pain in abdomen with deep breaths. ?On exam: pt points right to area of gallbladder when asked where pain is worst.  Has epigastric TTP and severe TTP right over gallbladder (seems like a positive Murphy's to me).  No rebound. ?1) check EKG ?2) check trop ?3) large LE and 11-20 WBC on UA noted, will order UCx. ?4) adding repeat lipase with AM labs ?5) RN giving PRN morphine ? ?Discussion: sounds very suspicious for biliary colic, question maybe new gallstone pancreatitis (though lipase NL in ED) but dont want to miss something cardiac. ? ?MRCP planned for AM, repeat CMP already planned for AM. ? ?No SIRS at this point to indicate starting ABx, RN will let me know if she develops anything. ? ?EKG = No STEMI, actually shows NSR now, prior A.Flutter not appreciated right now. ?

## 2022-01-08 NOTE — Progress Notes (Signed)
Notified on call attending due to patient having a new onset of 10/10 pain in right flank area radiating up to right shoulder. Patient also was experiencing pain in abdomen when taking deep breaths. Patient denied nausea. On call attending provider put in new orders, as well as came to see the patient. See on call attending's note for "Significant Event". ?

## 2022-01-08 NOTE — Progress Notes (Signed)
Case discussed with Dr. Rush Landmark, who is covering biliary cases.  Patient has no evidence of choledocholithiasis on MRCP and has no biliary ductal dilatation, but does have evidence of cholecystitis (clinically and radiographically). ? ?If patient is clinically deteriorating and is not a candidate for cholecystectomy, then we would recommend a percutaneous cholecystostomy drain.  We do not feel ERCP is going to help patient at this time. ?

## 2022-01-08 NOTE — Progress Notes (Signed)
ANTICOAGULATION CONSULT NOTE  ? ?Pharmacy Consult for IV heparin ?Indication: atrial fibrillation ? ?Allergies  ?Allergen Reactions  ? Contrast Media [Iodinated Contrast Media] Shortness Of Breath and Other (See Comments)  ?  Tightness in chest. Trouble breathing  ? Ciprofloxacin Itching and Rash  ?  Itching and rash on arm/chest  ? Clindamycin/Lincomycin Nausea Only  ? Penicillins Hives  ?  Marland KitchenMarland KitchenHas patient had a PCN reaction causing immediate rash, facial/tongue/throat swelling, SOB or lightheadedness with hypotension: No ?Has patient had a PCN reaction causing severe rash involving mucus membranes or skin necrosis: No ?Has patient had a PCN reaction that required hospitalization No ?Has patient had a PCN reaction occurring within the last 10 years: No ?If all of the above answers are "NO", then may proceed with Cephalosporin use. ?  ? ? ?Patient Measurements: ?Height: '5\' 2"'$  (157.5 cm) ?Weight: 80.8 kg (178 lb 2.1 oz) ?IBW/kg (Calculated) : 50.1 ?Heparin Dosing Weight: 68 kg  ? ?Vital Signs: ?Temp: 99.4 ?F (37.4 ?C) (04/02 1741) ?Temp Source: Oral (04/02 1741) ?BP: 144/79 (04/02 2049) ?Pulse Rate: 79 (04/02 2049) ? ?Labs: ?Recent Labs  ?  01/07/22 ?1829 01/08/22 ?0159 01/08/22 ?0448 01/08/22 ?1913  ?HGB 12.1 12.7  --   --   ?HCT 39.3 40.8  --   --   ?PLT 296 298  --   --   ?APTT  --   --   --  30  ?LABPROT  --   --   --  15.9*  ?INR  --   --   --  1.3*  ?CREATININE 0.78 0.72  --   --   ?TROPONINIHS  --  6 6  --   ? ? ?Estimated Creatinine Clearance: 59.9 mL/min (by C-G formula based on SCr of 0.72 mg/dL). ? ? ?Medications:  ?Medications Prior to Admission  ?Medication Sig Dispense Refill Last Dose  ? alendronate (FOSAMAX) 70 MG tablet Take 70 mg by mouth once a week. Take with a full glass of water on an empty stomach. Take on Fridays   01/06/2022  ? Camphor-Menthol-Methyl Sal (SALONPAS) 3.10-14-08 % PTCH Apply 1 application topically daily as needed (Pain).   unknown  ? Cholecalciferol (VITAMIN D) 50 MCG (2000 UT)  tablet Take 2,000 Units by mouth 3 (three) times a week.   01/07/2022  ? levothyroxine (SYNTHROID, LEVOTHROID) 100 MCG tablet Take 100 mcg by mouth daily before breakfast.   01/07/2022  ? ondansetron (ZOFRAN-ODT) 4 MG disintegrating tablet Take 4 mg by mouth every 8 (eight) hours as needed for nausea or vomiting.   01/07/2022  ? pantoprazole (PROTONIX) 40 MG tablet Take 40 mg by mouth 2 (two) times daily.   01/07/2022  ? rosuvastatin (CRESTOR) 10 MG tablet Take 10 mg by mouth 2 (two) times a week.   Past Week  ? vitamin B-12 (CYANOCOBALAMIN) 1000 MCG tablet Take 1,000 mcg by mouth 3 (three) times a week.   01/07/2022  ? vitamin C (ASCORBIC ACID) 500 MG tablet Take 500 mg by mouth 3 (three) times a week.   01/07/2022  ? zinc gluconate 50 MG tablet Take 50 mg by mouth 3 (three) times a week.   01/07/2022  ? ? ?Assessment: ?Pharmacy  is consulted to dose IV heparin who has been diagnosed with atrial fibrillation. No noted anticoagulation on med rec.  ? ?Pt also had to go emergently to IR for evaluation of emergent percutaneous drain.  ? ?Per consult ok to start heparin if cleared by IR. IR consult states  ok to resume one hour after procedure if stable.  ? ?Clarified with Dr. Vernard Gambles ok to start if vital signs stable and no signs of bleeding.  ? ?Confirmed with RN that pt is stable and has not had any bleeding issues.  ? ?Today, 01/08/22 ?Hgb 12.7 plt 298  ?SCr 0.72 mg/dl, CrCl 60 ml/min  ? ? ? ? ? ?Goal of Therapy:  ?Heparin level 0.3-0.7 units/ml ?Monitor platelets by anticoagulation protocol: Yes ?  ?Plan:  ?Due to recent procedure will avoid bolus  ?Start Heparin 1000 units/hr  ?Obtain HL 8 hours after start of infusion  ?Monitor for signs and symptoms of bleeding ? ? ?Royetta Asal, PharmD, BCPS ?01/08/2022 9:25 PM ? ? ? ? ?

## 2022-01-08 NOTE — Progress Notes (Signed)
?PROGRESS NOTE ? ? ? ?Katie Weber  DZH:[id number] DOB: 1946-05-28 DOA: 01/07/2022 ?PCP: [facility]  ? ? ? ?Brief Narrative:  ? ?Lorma Heater is a 76 y.o. female with medical history significant of CAD, hypothyroidism, diverticulitis.  ?C/o RUQ abd pain for past few days.  Went to [facility].  [place] suggested inflamed GB and pt sent in to ED.  ?Pain intermittent for past year but worse since Tues. ? ?Subjective: ? ?Continue to have ab pain  , has been requiring significant amount of analgesics  ? ?Assessment & Plan: ? Principal Problem: ?  Biliary colic ?Active Problems: ?  New onset atrial flutter ([place]) ?  Hypothyroidism ?  Acute cholecystitis ? ? ? ?Assessment and Plan: ? ?Acute cholecystitis/gallstone ?-CBD 95m, lipase wnl, elevated lft ?-Npo, prn analgesics,  ?-start empiric abx,  ?-GI/gen surg consulted  ? ?Addendum: ?She has persistent pain, start to have hypoxia, I worry she might deteriorate overnight without acute intervention ,I discussed with gen surg /GI/ IR over the phone, IR plan to come in to eval for urgent perc drain  ? ? ?[place] onset atrial flutter ([place] ?ED EKG showing new onset A.Flutter, appears to be rate controlled at 4:1 though without any rate meds. ?TSh wnl ?CHADS Vasc = 3 due to age and gender, Plan to start heparin drip after perc drain if ok with IR ?Currently in sinus rhythm, bp stable, Prn lopressor for now ?Echo pending ? ?Hypothyroidism ?Cont synthroid. ? ? Body mass index is 32.58 kg/m?.Marland Kitchen ? ? ? ?I have Reviewed nursing notes, Vitals, pain scores, I/o's, Lab results and  imaging results since pt's last encounter, details please see discussion above  ?I ordered the following labs:  ?Unresulted Labs (From admission, onward)  ? ?  Start     Ordered  ? 01/09/22 0500  CBC with Differential/Platelet  Tomorrow morning,   R       ? 01/08/22 1240  ? 01/09/22 0500  Comprehensive metabolic panel  Tomorrow morning,   R       ? 01/08/22 1240  ? 01/09/22 0500   Magnesium  Tomorrow morning,   R       ? 01/08/22 1240  ? 01/08/22 1822  Lactic acid, plasma  STAT Now then every 3 hours,   R (with STAT occurrences)     ? 01/08/22 1821  ? 01/08/22 0154  Urine Culture  Once,   R       ?Question:  Indication  Answer:  Flank Pain  ? 01/08/22 0153  ? ?  ?  ? ?  ? ? ? ?DVT prophylaxis: SCDs Start: 01/07/22 2127 ? ? ?Code Status:   Code Status: Full Code ? ?Family Communication: patient, missed husband at bedside, called husband x2, not able to reach him ?Disposition:  ? ?dispo: The patient is from: home  ?             Anticipated d/c is to: home ?             Anticipated d/c date is: TBD ? ?Antimicrobials:   ?Anti-infectives (From admission, onward)  ? ? Start     Dose/Rate Route Frequency Ordered Stop  ? 01/08/22 1830  meropenem (MERREM) 1 g in sodium chloride 0.9 % 100 mL IVPB       ? 1 g ?200 mL/hr over 30 Minutes Intravenous Every 8 hours 01/08/22 1812    ? 01/08/22 1400  ciprofloxacin (CIPRO) IVPB 400 mg  Status:  Discontinued       ?  400 mg ?200 mL/hr over 60 Minutes Intravenous Every 12 hours 01/08/22 1338 01/08/22 1722  ? 01/08/22 1300  metroNIDAZOLE (FLAGYL) IVPB 500 mg  Status:  Discontinued       ? 500 mg ?100 mL/hr over 60 Minutes Intravenous Every 12 hours 01/08/22 1242 01/08/22 1812  ? ?  ? ? ? ? ? ? ?Objective: ?Vitals:  ? 01/08/22 0553 01/08/22 1327 01/08/22 1741 01/08/22 1745  ?BP:  (!) 118/56 139/70   ?Pulse: 81 80 91   ?Resp:  18    ?Temp:  99.3 ?F (37.4 ?C) 99.4 ?F (37.4 ?C)   ?TempSrc:  Oral Oral   ?SpO2:  91% (!) 74% 90%  ?Weight:      ?Height:      ? ? ?Intake/Output Summary (Last 24 hours) at 01/08/2022 1857 ?Last data filed at 01/08/2022 1757 ?Gross per 24 hour  ?Intake 1736.71 ml  ?Output 450 ml  ?Net 1286.71 ml  ? ?Filed Weights  ? 01/07/22 2119 01/07/22 2130 01/08/22 0526  ?Weight: 78.1 kg 78.1 kg 80.8 kg  ? ? ?Examination: ? ?General exam: in pain ?Respiratory system: Clear to auscultation. Respiratory effort normal. ?Cardiovascular system:  RRR.   ?Gastrointestinal system: Abdomen  RUQ tenderness, not able to tolerate exam ?Central nervous system: Alert and oriented. No focal neurological deficits. ?Extremities:  no edema ?Skin: No rashes, lesions or ulcers ?Psychiatry: Judgement and insight appear normal. Mood & affect appropriate.  ? ? ? ?Data Reviewed: I have personally reviewed  labs and visualized  imaging studies since the last encounter and formulate the plan  ? ? ? ? ? ? ?Scheduled Meds: ? levothyroxine  100 mcg Oral Q0600  ? mouth rinse  15 mL Mouth Rinse BID  ? pantoprazole  40 mg Oral BID  ? ?Continuous Infusions: ? lactated ringers 100 mL/hr at 01/08/22 0809  ? meropenem (MERREM) IV 1 g (01/08/22 1856)  ? ? ? LOS: 0 days  ? ? ? ? ?Florencia Reasons, MD PhD FACP ?[facility] ? ?Available via Epic secure chat 7am-7pm for nonurgent issues ?Please page for urgent issues ?To page the attending provider between 7A-7P or the covering provider during after hours 7P-7A, please log into the web site [email or web address] and access using universal [place] password for that web site. If you do not have the password, please call the hospital operator. ? ? ? ?01/08/2022, 6:57 PM  ? ? ?

## 2022-01-08 NOTE — Progress Notes (Signed)
Patient started on IV antibiotics, Flagyl 1st and Cipro 2nd, patient called and C/O left arm and upper chest itching, noted increased redness and rash more than she had prior to admission. Dr. Erlinda Hong and pharmacy notified, order given for benadryl and  solumedrol and Cipro D/C'd, will continue to assess patient.  ?

## 2022-01-08 NOTE — Plan of Care (Signed)

## 2022-01-08 NOTE — Consult Note (Signed)
Reason for Consult:abd pain ?Referring Physician: Dr. Erlinda Hong ? ?Katie Weber is an 76 y.o. female.  ?HPI: We are asked to see the patient in consultation by Dr. Erlinda Hong to evaluate her for cholecystitis.  The patient is a 76 year old white female who has been experiencing abdominal pain off and on for the last few months.  Her most recent bout of pain started last Tuesday.  The pain has gotten more severe during that time.  She came to the emergency department yesterday where imaging showed a very thickened gallbladder wall with stones.  Her liver functions have been elevating.  She had an MRCP today that did not show a common duct stone.  She does not smoke. ? ?Past Medical History:  ?Diagnosis Date  ? Anemia   ? when pt was younger  ? Arthritis   ? arthritis fingers  ? Asthma   ? Coronary artery calcification 04/24/2017  ? Diverticulitis   ? x2 -last 1'17 tx. outpatient.  ? Environmental and seasonal allergies   ? 2014 Possibly related to air freshners.  ? 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  ? Family history of anesthesia complication 32KGU ago  ? daughter had an allergic reaction to atropine  ? GERD (gastroesophageal reflux disease)   ? no meds  ? History of hiatal hernia 09/14/2014  ? seen on xray  ? History of kidney stones   ? '80's lithotripsy-[facility] x1  ? History of kidney stones   ? x1  ? Hyperlipidemia   ? Hypothyroidism   ? Pneumonia 2014  ? Pure hypercholesterolemia   ? Tremor of left hand   ? ? ?Past Surgical History:  ?Procedure Laterality Date  ? BALLOON DILATION N/A 05/04/2014  ? Procedure: BALLOON DILATION;  Surgeon: Garlan Fair, MD;  Location: [facility];  Service: Endoscopy;  Laterality: N/A;  ? BREAST LUMPECTOMY Right 1972  ? benign   ? BREAST SURGERY Right   ? '72 -benign tumor  ? childbirth- NVD x2     ? COLONOSCOPY WITH PROPOFOL N/A 05/04/2014  ? Procedure: COLONOSCOPY WITH PROPOFOL;  Surgeon: Garlan Fair, MD;   Location: [facility];  Service: Endoscopy;  Laterality: N/A;  ? DILATION AND CURETTAGE OF UTERUS    ? 04-20-14 [facility] endometrial polyp removed.  ? ESOPHAGOGASTRODUODENOSCOPY (EGD) WITH PROPOFOL N/A 05/04/2014  ? Procedure: ESOPHAGOGASTRODUODENOSCOPY (EGD) WITH PROPOFOL;  Surgeon: Garlan Fair, MD;  Location: [facility];  Service: Endoscopy;  Laterality: N/A;. Procedure done in [place], [place]- 5 yrs ago.  ? HYSTEROSCOPY WITH D & C N/A 04/20/2014  ? Procedure: DILATATION AND CURETTAGE /HYSTEROSCOPY;  Surgeon: [name], MD;  Location: [facility];  Service: Gynecology;  Laterality: N/A;  ? LITHOTRIPSY    ? POLYPECTOMY  2020  ? Dr. Lear Ng  ? 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 Procedure: LEFT TOTAL KNEE ARTHROPLASTY;  Surgeon: Mcarthur Rossetti, MD;  Location: [place];  Service: Orthopedics;  Laterality: Left;  ? TOTAL KNEE ARTHROPLASTY Right 08/09/2021  ? Procedure: RIGHT TOTAL KNEE ARTHROPLASTY;  Surgeon: Mcarthur Rossetti, MD;  Location: [place];  Service: Orthopedics;  Laterality: Right;  Needs RNFA  ? TUBAL LIGATION    ? ? ?Family History  ?Problem Relation Age of Onset  ? Cancer Other   ? Endometrial cancer Mother   ? CAD Father   ? Breast cancer Maternal Aunt   ? ? ?Social History:  reports  that she has never smoked. She has never used smokeless tobacco. She reports current alcohol use. She reports that she does not use drugs. ? ?Allergies:  ?Allergies  ?Allergen Reactions  ? Contrast Media [Iodinated Contrast Media] Shortness Of Breath and Other (See Comments)  ?  Tightness in chest. Trouble breathing  ? Clindamycin/Lincomycin Nausea Only  ? Penicillins Hives  ?  Marland KitchenMarland KitchenHas patient had a PCN reaction causing immediate rash, facial/tongue/throat swelling, SOB or lightheadedness with hypotension: No ?Has patient had a PCN reaction causing severe rash involving mucus membranes or skin necrosis: No ?Has patient had a PCN reaction that required hospitalization No ?Has patient had  a PCN reaction occurring within the last 10 years: No ?If all of the above answers are "NO", then may proceed with Cephalosporin use. ?  ? ? ?Medications: I have reviewed the patient's current medications. ? ?Results for orders placed or performed during the hospital encounter of 01/07/22 (from the past 48 hour(s))  ?Lipase, blood     Status: None  ? Collection Time: 01/07/22  6:29 PM  ?Result Value Ref Range  ? Lipase 37 11 - 51 U/L  ?  Comment: Performed at [facility], [address], [place], [place] [address]  ?Comprehensive metabolic panel     Status: Abnormal  ? Collection Time: 01/07/22  6:29 PM  ?Result Value Ref Range  ? Sodium 141 135 - 145 mmol/L  ? Potassium 3.9 3.5 - 5.1 mmol/L  ? Chloride 105 98 - 111 mmol/L  ? CO2 27 22 - 32 mmol/L  ? Glucose, Bld 116 (H) 70 - 99 mg/dL  ?  Comment: Glucose reference range applies only to samples taken after fasting for at least 8 hours.  ? BUN 10 8 - 23 mg/dL  ? Creatinine, Ser 0.78 0.44 - 1.00 mg/dL  ? Calcium 9.7 8.9 - 10.3 mg/dL  ? Total Protein 7.6 6.5 - 8.1 g/dL  ? Albumin 4.2 3.5 - 5.0 g/dL  ? AST 283 (H) 15 - 41 U/L  ? ALT 119 (H) 0 - 44 U/L  ? Alkaline Phosphatase 178 (H) 38 - 126 U/L  ? Total Bilirubin 1.8 (H) 0.3 - 1.2 mg/dL  ? GFR, Estimated >60 >60 mL/min  ?  Comment: (NOTE) ?Calculated using the CKD-EPI Creatinine Equation (2021) ?  ? Anion gap 9 5 - 15  ?  Comment: Performed at [facility], [address] [place], [place] [address]  ?CBC     Status: Abnormal  ? Collection Time: 01/07/22  6:29 PM  ?Result Value Ref Range  ? WBC 10.5 4.0 - 10.5 K/uL  ? RBC 4.75 3.87 - 5.11 MIL/uL  ? Hemoglobin 12.1 12.0 - 15.0 g/dL  ? HCT 39.3 36.0 - 46.0 %  ? MCV 82.7 80.0 - 100.0 fL  ? MCH 25.5 (L) 26.0 - 34.0 pg  ? MCHC 30.8 30.0 - 36.0 g/dL  ? RDW 17.5 (H) 11.5 - 15.5 %  ? Platelets 296 150 - 400 K/uL  ? nRBC 0.0 0.0 - 0.2 %  ?  Comment: Performed at [facility], [address], [place], [place] [address]   ?Urinalysis, Routine w reflex microscopic Urine, Unspecified Source     Status: Abnormal  ? Collection Time: 01/07/22  7:35 PM  ?Result Value Ref Range  ? Color, Urine YELLOW YELLOW  ? APPearance CLEAR CLEAR  ? Specific Gravity, Urine 1.028 1.005 - 1.030  ? pH 5.5 5.0 - 8.0  ? Glucose, UA NEGATIVE  NEGATIVE mg/dL  ? Hgb urine dipstick NEGATIVE NEGATIVE  ? Bilirubin Urine NEGATIVE NEGATIVE  ? Ketones, ur TRACE (A) NEGATIVE mg/dL  ? Protein, ur TRACE (A) NEGATIVE mg/dL  ? Nitrite NEGATIVE NEGATIVE  ? Leukocytes,Ua LARGE (A) NEGATIVE  ? RBC / HPF 6-10 0 - 5 RBC/hpf  ? WBC, UA 11-20 0 - 5 WBC/hpf  ? Bacteria, UA RARE (A) NONE SEEN  ? Squamous Epithelial / LPF 0-5 0 - 5  ? Mucus PRESENT   ? Hyaline Casts, UA PRESENT   ? Ca Oxalate Crys, UA PRESENT   ? Non Squamous Epithelial 0-5 (A) NONE SEEN  ?  Comment: Performed at [facility], [address] [place], [place] [address]  ?TSH     Status: None  ? Collection Time: 01/07/22 10:02 PM  ?Result Value Ref Range  ? TSH 4.143 0.350 - 4.500 uIU/mL  ?  Comment: Performed by a 3rd Generation assay with a functional sensitivity of <=0.01 uIU/mL. ?Performed at [facility], [place] [address] [place], [place] [address] ?  ?CBC     Status: Abnormal  ? Collection Time: 01/08/22  1:59 AM  ?Result Value Ref Range  ? WBC 14.3 (H) 4.0 - 10.5 K/uL  ? RBC 4.85 3.87 - 5.11 MIL/uL  ? Hemoglobin 12.7 12.0 - 15.0 g/dL  ? HCT 40.8 36.0 - 46.0 %  ? MCV 84.1 80.0 - 100.0 fL  ? MCH 26.2 26.0 - 34.0 pg  ? MCHC 31.1 30.0 - 36.0 g/dL  ? RDW 17.9 (H) 11.5 - 15.5 %  ? Platelets 298 150 - 400 K/uL  ? nRBC 0.0 0.0 - 0.2 %  ?  Comment: Performed at [facility], [place] [address] [place], [place] [address]  ?Comprehensive metabolic panel     Status: Abnormal  ? Collection Time: 01/08/22  1:59 AM  ?Result Value Ref Range  ? Sodium 138 135 - 145 mmol/L  ? Potassium 3.9 3.5 - 5.1 mmol/L  ? Chloride 105 98 - 111 mmol/L  ? CO2 26 22 - 32 mmol/L  ? Glucose,  Bld 143 (H) 70 - 99 mg/dL  ?  Comment: Glucose reference range applies only to samples taken after fasting for at least 8 hours.  ? BUN 8 8 - 23 mg/dL  ? Creatinine, Ser 0.72 0.44 - 1.00 mg/dL  ? Calcium 9

## 2022-01-08 NOTE — Consult Note (Signed)
[facility] Consultation Note ? ?Referring Provider: [facility] ?Primary Care Physician:  Pa, [place] ?Primary Gastroenterologist:  Dr. Michail Sermon ? ?Reason for Consultation:  abdominal pain, elevated LFTs ? ?HPI: Katie Weber is a 76 y.o. female recurrent intermittent abdominal pain for several months, worse over the past several days, much worse yesterday.  Pain epigastrium, substernal chest, right upper quadrant, radiation to sides and back.  No fevers, jaundice, blood in stool.  Symptoms worse after eating.  Imaging consistent with gallstones, gallbladder wall thickening, with no evidence of biliary ductal dilatation of choledocholithiasis. ? ? ?Past Medical History:  ?Diagnosis Date  ? Anemia   ? when pt was younger  ? Arthritis   ? arthritis fingers  ? Asthma   ? Coronary artery calcification 04/24/2017  ? Diverticulitis   ? x2 -last 1'17 tx. outpatient.  ? Environmental and seasonal allergies   ? 2014 Possibly related to air freshners.  ? 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  ? Family history of anesthesia complication 03JKK ago  ? daughter had an allergic reaction to atropine  ? GERD (gastroesophageal reflux disease)   ? no meds  ? History of hiatal hernia 09/14/2014  ? seen on xray  ? History of kidney stones   ? '80's lithotripsy-[facility] x1  ? History of kidney stones   ? x1  ? Hyperlipidemia   ? Hypothyroidism   ? Pneumonia 2014  ? Pure hypercholesterolemia   ? Tremor of left hand   ? ? ?Past Surgical History:  ?Procedure Laterality Date  ? BALLOON DILATION N/A 05/04/2014  ? Procedure: BALLOON DILATION;  Surgeon: Garlan Fair, MD;  Location: [facility];  Service: Endoscopy;  Laterality: N/A;  ? BREAST LUMPECTOMY Right 1972  ? benign   ? BREAST SURGERY Right   ? '72 -benign tumor  ? childbirth- NVD x2     ? COLONOSCOPY WITH PROPOFOL N/A 05/04/2014  ? Procedure: COLONOSCOPY WITH PROPOFOL;   Surgeon: Garlan Fair, MD;  [facility];  Service: Endoscopy;  Laterality: N/A;  ? DILATION AND CURETTAGE OF UTERUS    ? 04-20-14 [facility] endometrial polyp removed.  ? ESOPHAGOGASTRODUODENOSCOPY (EGD) WITH PROPOFOL N/A 05/04/2014  ? Procedure: ESOPHAGOGASTRODUODENOSCOPY (EGD) WITH PROPOFOL;  Surgeon: Garlan Fair, MD;  [facility];  Service: Endoscopy;  Laterality: N/A;. Procedure done in [place], [place]- 5 yrs ago.  ? HYSTEROSCOPY WITH D & C N/A 04/20/2014  ? Procedure: DILATATION AND CURETTAGE /HYSTEROSCOPY;  Surgeon: [name], MD;  Location: [facility];  Service: Gynecology;  Laterality: N/A;  ? LITHOTRIPSY    ? POLYPECTOMY  2020  ? Dr. Lear Ng  ? 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 Procedure: LEFT TOTAL KNEE ARTHROPLASTY;  Surgeon: Mcarthur Rossetti, MD;  Location: [facility];  Service: Orthopedics;  Laterality: Left;  ? TOTAL KNEE ARTHROPLASTY Right 08/09/2021  ? Procedure: RIGHT TOTAL KNEE ARTHROPLASTY;  Surgeon: Mcarthur Rossetti, MD;  Location: [place] [facility];  Service: Orthopedics;  Laterality: Right;  Needs RNFA  ? TUBAL LIGATION    ? ? ?Prior to Admission medications   ?Medication Sig Start Date End Date Taking? Authorizing Provider  ?alendronate (FOSAMAX) 70 MG tablet Take 70 mg by mouth once a week. Take with a full glass of water on an empty stomach. Take on Fridays   Yes [provider]  ?Camphor-Menthol-Methyl Sal (SALONPAS) 3.10-14-08 % PTCH Apply 1 application  topically daily as needed (Pain).   Yes [provider]  ?Cholecalciferol (VITAMIN D) 50 MCG (2000 UT) tablet Take 2,000 Units by mouth 3 (three) times a week.   Yes [provider]  ?levothyroxine (SYNTHROID, LEVOTHROID) 100 MCG tablet Take 100 mcg by mouth daily before breakfast.   Yes [provider]  ?ondansetron (ZOFRAN-ODT) 4 MG disintegrating tablet Take 4 mg by mouth every 8 (eight) hours as needed for nausea or vomiting. 01/04/22  Yes [provider]  ?pantoprazole (PROTONIX) 40 MG tablet Take 40 mg by mouth 2 (two) times daily. 01/04/22  Yes [provider]  ?rosuvastatin (CRESTOR) 10 MG tablet Take 10 mg by mouth 2 (two) times a week.   Yes [provider]  ?vitamin B-12 (CYANOCOBALAMIN) 1000 MCG tablet Take 1,000 mcg by mouth 3 (three) times a week.   Yes [provider]  ?vitamin C (ASCORBIC ACID) 500 MG tablet Take 500 mg by mouth 3 (three) times a week.   Yes [provider]  ?zinc gluconate 50 MG tablet Take 50 mg by mouth 3 (three) times a week.   Yes [provider]  ? ? ?Current Facility-Administered Medications  ?Medication Dose Route Frequency Provider Last Rate Last Admin  ? acetaminophen (TYLENOL) tablet 650 mg  650 mg Oral Q6H PRN Etta Quill, DO      ? Or  ? acetaminophen (TYLENOL) suppository 650 mg  650 mg Rectal Q6H PRN Etta Quill, DO      ? lactated ringers infusion   Intravenous Continuous Etta Quill, DO 100 mL/hr at 01/08/22 0809 New Bag at 01/08/22 0809  ? levothyroxine (SYNTHROID) tablet 100 mcg  100 mcg Oral Q0600 Etta Quill, DO   100 mcg at 01/08/22 0500  ? MEDLINE mouth rinse  15 mL Mouth Rinse BID Jennette Kettle M, DO   15 mL at 01/08/22 1028  ? morphine (PF) 2 MG/ML injection 2-4 mg  2-4 mg Intravenous Q2H PRN Etta Quill, DO   2 mg at 01/08/22 1023  ? ondansetron (ZOFRAN) tablet 4 mg  4 mg Oral Q6H PRN Etta Quill, DO      ? Or  ? ondansetron (ZOFRAN) injection 4 mg  4 mg Intravenous Q6H PRN Etta Quill, DO      ? pantoprazole (PROTONIX) EC tablet 40 mg  40 mg Oral BID Jennette Kettle M, DO   40 mg at 01/08/22 1019  ? ? ?Allergies as of 01/07/2022 - Review Complete 01/07/2022  ?Allergen Reaction Noted  ? Contrast media [iodinated contrast media] Shortness Of Breath and Other (See Comments) 09/27/2014  ? Clindamycin/lincomycin Nausea Only 01/07/2022  ? Penicillins Hives 04/09/2014  ? ? ?Family History  ?Problem Relation Age of Onset  ?  Cancer Other   ? Endometrial cancer Mother   ? CAD Father   ? Breast cancer Maternal Aunt   ? ? ?Social History  ? ?Socioeconomic History  ? Marital status: Married  ?  Spouse name: Not on file  ? Number of children: Not on file  ? Years of education: Not on file  ? Highest education level: Not on file  ?Occupational History  ? Not on file  ?Tobacco Use  ? Smoking status: Never  ? Smokeless tobacco: Never  ?Vaping Use  ? Vaping Use: Never used  ?Substance and Sexual Activity  ? Alcohol use: Yes  ?  Comment: rare social  ? Drug use: No  ? Sexual activity: Not  Currently  ?Other Topics Concern  ? Not on file  ?Social History Narrative  ? Not on file  ? ?Social Determinants of Health  ? ?Financial Resource Strain: Not on file  ?Food Insecurity: Not on file  ?Transportation Needs: Not on file  ?Physical Activity: Not on file  ?Stress: Not on file  ?Social Connections: Not on file  ?Intimate Partner Violence: Not on file  ? ? ?Review of Systems: As per HPI, all others negative ? ?Physical Exam: ?Vital signs in last 24 hours: ?Temp:  [98.6 ?F (37 ?C)-99.9 ?F (37.7 ?C)] 99.9 ?F (37.7 ?C) (04/02 0526) ?Pulse Rate:  [58-83] 81 (04/02 0553) ?Resp:  [15-28] 20 (04/02 0526) ?BP: (136-173)/(64-77) 136/64 (04/02 0526) ?SpO2:  [93 %-100 %] 93 % (04/02 0526) ?Weight:  [76.7 kg-80.8 kg] 80.8 kg (04/02 0526) ?Last BM Date : 01/07/22 ?General:   Alert,  Well-developed, well-nourished, pleasant and cooperative in NAD ?Head:  Normocephalic and atraumatic. ?Eyes:  Sclera clear, no icterus.   Conjunctiva pink. ?Ears:  Normal auditory acuity. ?Nose:  No deformity, discharge,  or lesions. ?Mouth:  No deformity or lesions.  Oropharynx pink & moist. ?Neck:  Supple; no masses or thyromegaly. ?Abdomen:  Soft, epigastric and RUQ tenderness with voluntary guarding, no rigidity or peritonitis, No masses, hepatosplenomegaly or hernias noted. ?Msk:  Symmetrical without gross deformities. Normal posture. ?Pulses:  Normal pulses noted. ?Extremities:   Without clubbing or edema. ?Neurologic:  Alert and  oriented x4;  grossly normal neurologically. ?Skin:  Intact without significant lesions or rashes. ?Psych:  Alert and cooperative. Normal mood and affect. ? ?

## 2022-01-08 NOTE — Progress Notes (Addendum)
Pharmacy Antibiotic Note ? ?Katie Weber is a 76 y.o. female admitted on 01/07/2022 with IAI. Pharmacy has been consulted for ciprofloxacin  dosing. ? ?Plan: ?Ciprofloxacin 400 mg IV q12h  ?Metronidazole 500 mg IV q12h per MD ? ?Height: '5\' 2"'$  (157.5 cm) ?Weight: 80.8 kg (178 lb 2.1 oz) ?IBW/kg (Calculated) : 50.1 ? ?Temp (24hrs), Avg:99.3 ?F (37.4 ?C), Min:98.6 ?F (37 ?C), Max:99.9 ?F (37.7 ?C) ? ?Recent Labs  ?Lab 01/07/22 ?[facility] 01/08/22 ?0159  ?WBC 10.5 14.3*  ?CREATININE 0.78 0.72  ?  ?Estimated Creatinine Clearance: 59.9 mL/min (by C-G formula based on SCr of 0.72 mg/dL).   ? ?Allergies  ?Allergen Reactions  ? Contrast Media [Iodinated Contrast Media] Shortness Of Breath and Other (See Comments)  ?  Tightness in chest. Trouble breathing  ? Clindamycin/Lincomycin Nausea Only  ? Penicillins Hives  ?  Marland KitchenMarland KitchenHas patient had a PCN reaction causing immediate rash, facial/tongue/throat swelling, SOB or lightheadedness with hypotension: No ?Has patient had a PCN reaction causing severe rash involving mucus membranes or skin necrosis: No ?Has patient had a PCN reaction that required hospitalization No ?Has patient had a PCN reaction occurring within the last 10 years: No ?If all of the above answers are "NO", then may proceed with Cephalosporin use. ?  ? ? ?Antimicrobials this admission: ?4/2 ciprofloxacin >>  ?4/2 metronidazole >>  ? ?Dose adjustments this admission: ? ? ?Microbiology results: ?4/2 UCx:   ?  ? ?Thank you for allowing pharmacy to be a part of this patient?s care. ? ?Royetta Asal, PharmD, BCPS ?01/08/2022 3:33 PM ? ? ?

## 2022-01-09 ENCOUNTER — Inpatient Hospital Stay (HOSPITAL_COMMUNITY): Payer: Medicare Other

## 2022-01-09 DIAGNOSIS — K8050 Calculus of bile duct without cholangitis or cholecystitis without obstruction: Secondary | ICD-10-CM | POA: Diagnosis not present

## 2022-01-09 DIAGNOSIS — I4891 Unspecified atrial fibrillation: Secondary | ICD-10-CM | POA: Diagnosis not present

## 2022-01-09 LAB — CBC WITH DIFFERENTIAL/PLATELET
Abs Immature Granulocytes: 0.08 10*3/uL — ABNORMAL HIGH (ref 0.00–0.07)
Basophils Absolute: 0 10*3/uL (ref 0.0–0.1)
Basophils Relative: 0 %
Eosinophils Absolute: 0 10*3/uL (ref 0.0–0.5)
Eosinophils Relative: 0 %
HCT: 37.3 % (ref 36.0–46.0)
Hemoglobin: 11.7 g/dL — ABNORMAL LOW (ref 12.0–15.0)
Immature Granulocytes: 1 %
Lymphocytes Relative: 5 %
Lymphs Abs: 0.7 10*3/uL (ref 0.7–4.0)
MCH: 26.9 pg (ref 26.0–34.0)
MCHC: 31.4 g/dL (ref 30.0–36.0)
MCV: 85.7 fL (ref 80.0–100.0)
Monocytes Absolute: 0.5 10*3/uL (ref 0.1–1.0)
Monocytes Relative: 3 %
Neutro Abs: 14.5 10*3/uL — ABNORMAL HIGH (ref 1.7–7.7)
Neutrophils Relative %: 91 %
Platelets: 251 10*3/uL (ref 150–400)
RBC: 4.35 MIL/uL (ref 3.87–5.11)
RDW: 18 % — ABNORMAL HIGH (ref 11.5–15.5)
WBC: 15.9 10*3/uL — ABNORMAL HIGH (ref 4.0–10.5)
nRBC: 0 % (ref 0.0–0.2)

## 2022-01-09 LAB — COMPREHENSIVE METABOLIC PANEL
ALT: 143 U/L — ABNORMAL HIGH (ref 0–44)
AST: 114 U/L — ABNORMAL HIGH (ref 15–41)
Albumin: 3 g/dL — ABNORMAL LOW (ref 3.5–5.0)
Alkaline Phosphatase: 140 U/L — ABNORMAL HIGH (ref 38–126)
Anion gap: 6 (ref 5–15)
BUN: 14 mg/dL (ref 8–23)
CO2: 26 mmol/L (ref 22–32)
Calcium: 8.6 mg/dL — ABNORMAL LOW (ref 8.9–10.3)
Chloride: 105 mmol/L (ref 98–111)
Creatinine, Ser: 0.61 mg/dL (ref 0.44–1.00)
GFR, Estimated: >60 mL/min (ref >60)
Glucose, Bld: 132 mg/dL — ABNORMAL HIGH (ref 70–99)
Potassium: 4.2 mmol/L (ref 3.5–5.1)
Sodium: 137 mmol/L (ref 135–145)
Total Bilirubin: 6.1 mg/dL — ABNORMAL HIGH (ref 0.3–1.2)
Total Protein: 6.6 g/dL (ref 6.5–8.1)

## 2022-01-09 LAB — URINE CULTURE: Culture: NO GROWTH

## 2022-01-09 LAB — ECHOCARDIOGRAM COMPLETE
AR max vel: 2.91 cm2
AV Area VTI: 2.86 cm2
AV Area mean vel: 2.58 cm2
AV Mean grad: 5 mmHg
AV Peak grad: 9.2 mmHg
Ao pk vel: 1.52 m/s
Area-P 1/2: 3.39 cm2
Height: 62 in
S' Lateral: 2.9 cm
Weight: 2850.11 oz

## 2022-01-09 LAB — MAGNESIUM: Magnesium: 2.1 mg/dL (ref 1.7–2.4)

## 2022-01-09 LAB — HEPARIN LEVEL (UNFRACTIONATED)
Heparin Unfractionated: 0.3 IU/mL (ref 0.30–0.70)
Heparin Unfractionated: 0.14 IU/mL — ABNORMAL LOW (ref 0.30–0.70)

## 2022-01-09 NOTE — Progress Notes (Signed)
[facility] ?Progress Note ? ?   ?Subjective: ?CC-  ?Feeling much better since perc chole tube placed yesterday. Denies any abdominal pain, n/v. BM this morning. ? ?Objective: ?Vital signs in last 24 hours: ?Temp:  [97.8 ?F (36.6 ?C)-99.4 ?F (37.4 ?C)] 97.8 ?F (36.6 ?C) (04/03 1610) ?Pulse Rate:  [62-91] 66 (04/03 9604) ?Resp:  [14-20] 18 (04/03 5409) ?BP: (100-154)/(53-79) 112/61 (04/03 8119) ?SpO2:  [74 %-94 %] 93 % (04/03 1478) ?Last BM Date : 01/07/22 ? ?Intake/Output from previous day: ?04/02 0701 - 04/03 0700 ?In: 1708.3 [I.V.:1408.3; IV Piggyback:300] ?Out: 250 [Urine:250] ?Intake/Output this shift: ?Total I/O ?In: -  ?Out: 400 [Urine:400] ? ?PE: ?Gen:  Alert, NAD, pleasant ?Abd: soft, ND, NT, perc chole tube in place with bilious fluid in bag ? ?Lab Results:  ?Recent Labs  ?  01/08/22 ?0159 01/09/22 ?2956  ?WBC 14.3* 15.9*  ?HGB 12.7 11.7*  ?HCT 40.8 37.3  ?PLT 298 251  ? ?BMET ?Recent Labs  ?  01/08/22 ?0159 01/09/22 ?0609  ?NA 138 137  ?K 3.9 4.2  ?CL 105 105  ?CO2 26 26  ?GLUCOSE 143* 132*  ?BUN 8 14  ?CREATININE 0.72 0.61  ?CALCIUM 9.1 8.6*  ? ?PT/INR ?Recent Labs  ?  01/08/22 ?1913  ?LABPROT 15.9*  ?INR 1.3*  ? ?CMP  ?   ?Component Value Date/Time  ? NA 137 01/09/2022 0609  ? K 4.2 01/09/2022 0609  ? CL 105 01/09/2022 0609  ? CO2 26 01/09/2022 0609  ? GLUCOSE 132 (H) 01/09/2022 2130  ? BUN 14 01/09/2022 0609  ? CREATININE 0.61 01/09/2022 0609  ? CALCIUM 8.6 (L) 01/09/2022 8657  ? PROT 6.6 01/09/2022 0609  ? ALBUMIN 3.0 (L) 01/09/2022 8469  ? AST 114 (H) 01/09/2022 6295  ? ALT 143 (H) 01/09/2022 2841  ? ALKPHOS 140 (H) 01/09/2022 3244  ? BILITOT 6.1 (H) 01/09/2022 0102  ? GFRNONAA >60 01/09/2022 0609  ? GFRAA >60 10/16/2015 1710  ? ?Lipase  ?   ?Component Value Date/Time  ? LIPASE 38 01/08/2022 0448  ? ? ? ? ? ?Studies/Results: ?MR ABDOMEN MRCP WO CONTRAST ? ?Result Date: 01/08/2022 ?CLINICAL DATA:  Cholelithiasis. EXAM: MRI ABDOMEN WITHOUT CONTRAST  (INCLUDING MRCP) TECHNIQUE: Multiplanar  multisequence MR imaging of the abdomen was performed. Heavily T2-weighted images of the biliary and pancreatic ducts were obtained, and three-dimensional MRCP images were rendered by post processing. COMPARISON:  Ultrasound exam 01/05/2022.  CT scan 04/25/2021 FINDINGS: Lower chest: Unremarkable. Hepatobiliary: Tiny T2 hyperintensity in the dome of the left liver corresponds to a similar tiny hypodensity on previous CT. This is most suggestive of a tiny hepatic cyst. No follow-up recommended. Gallbladder is distended with gallbladder wall thickening and pericholecystic edema. Layering tiny gallstones evident measuring in the 3-5 mm size range. No intrahepatic or extrahepatic biliary dilation. No choledocholithiasis Pancreas: No focal mass lesion. No dilatation of the main duct. No intraparenchymal cyst. No peripancreatic edema. Spleen:  No splenomegaly. No focal mass lesion. Adrenals/Urinary Tract: No adrenal nodule or mass. Left kidney unremarkable. 5.3 cm simple exophytic cyst noted interpolar right kidney, stable since prior CT. No followup recommended. Stomach/Bowel: Moderate to large hiatal hernia. Duodenum is normally positioned as is the ligament of Treitz. No small bowel or colonic dilatation within the visualized abdomen. Vascular/Lymphatic: No abdominal aortic aneurysm. No abdominal lymphadenopathy Other:  Trace free fluid adjacent to the liver. Musculoskeletal: No suspicious marrow signal abnormality. IMPRESSION: 1. Cholelithiasis with gallbladder wall thickening and pericholecystic edema. Imaging features compatible with acute  cholecystitis. 2. No intrahepatic or extrahepatic biliary dilation. No choledocholithiasis. 3. Moderate to large hiatal hernia. Electronically Signed   By: Misty Stanley M.D.   On: 01/08/2022 09:34  ? ?MR 3D Recon At Scanner ? ?Result Date: 01/08/2022 ?CLINICAL DATA:  Cholelithiasis. EXAM: MRI ABDOMEN WITHOUT CONTRAST  (INCLUDING MRCP) TECHNIQUE: Multiplanar multisequence MR imaging  of the abdomen was performed. Heavily T2-weighted images of the biliary and pancreatic ducts were obtained, and three-dimensional MRCP images were rendered by post processing. COMPARISON:  Ultrasound exam 01/05/2022.  CT scan 04/25/2021 FINDINGS: Lower chest: Unremarkable. Hepatobiliary: Tiny T2 hyperintensity in the dome of the left liver corresponds to a similar tiny hypodensity on previous CT. This is most suggestive of a tiny hepatic cyst. No follow-up recommended. Gallbladder is distended with gallbladder wall thickening and pericholecystic edema. Layering tiny gallstones evident measuring in the 3-5 mm size range. No intrahepatic or extrahepatic biliary dilation. No choledocholithiasis Pancreas: No focal mass lesion. No dilatation of the main duct. No intraparenchymal cyst. No peripancreatic edema. Spleen:  No splenomegaly. No focal mass lesion. Adrenals/Urinary Tract: No adrenal nodule or mass. Left kidney unremarkable. 5.3 cm simple exophytic cyst noted interpolar right kidney, stable since prior CT. No followup recommended. Stomach/Bowel: Moderate to large hiatal hernia. Duodenum is normally positioned as is the ligament of Treitz. No small bowel or colonic dilatation within the visualized abdomen. Vascular/Lymphatic: No abdominal aortic aneurysm. No abdominal lymphadenopathy Other:  Trace free fluid adjacent to the liver. Musculoskeletal: No suspicious marrow signal abnormality. IMPRESSION: 1. Cholelithiasis with gallbladder wall thickening and pericholecystic edema. Imaging features compatible with acute cholecystitis. 2. No intrahepatic or extrahepatic biliary dilation. No choledocholithiasis. 3. Moderate to large hiatal hernia. Electronically Signed   By: Misty Stanley M.D.   On: 01/08/2022 09:34  ? ?IR Perc Cholecystostomy ? ?Result Date: 01/09/2022 ?CLINICAL DATA:  Cholelithiasis, acute cholecystitis EXAM: PERCUTANEOUS CHOLECYSTOSTOMY TUBE PLACEMENT WITH ULTRASOUND AND FLUOROSCOPIC GUIDANCE FLUOROSCOPY:  Radiation Exposure Index (as provided by the fluoroscopic device): 8 mGy air Kerma TECHNIQUE: The procedure, risks (including but not limited to bleeding, infection, organ damage ), benefits, and alternatives were explained to the patient. Questions regarding the procedure were encouraged and answered. The patient understands and consents to the procedure. Survey ultrasound of the abdomen was performed and an appropriate skin entry site was identified. Skin site was marked, prepped with Betadine, and draped in usual sterile fashion, and infiltrated locally with 1% lidocaine. Intravenous Fentanyl 53mg and Versed '1mg'$  were administered as conscious sedation during continuous monitoring of the patient's level of consciousness and physiological / cardiorespiratory status by the radiology RN, with a total moderate sedation time of 10 minutes. Under real-time ultrasound guidance, gallbladder was accessed using a transhepatic approach with a 21-gauge needle. Ultrasound image documentation was saved. Bile returned through the hub. Needle was exchanged over a 018 guidewire for transitional dilator which allowed placement of 035 J wire. Over this, a 10.2 French pigtail catheter was advanced and formed centrally in the gallbladder lumen. Small contrast injection confirmed appropriate position. Catheter secured externally with 0 Prolene suture and placed external drain bag. Patient tolerated the procedure well. COMPLICATIONS: COMPLICATIONS none IMPRESSION: 1. Technically successful percutaneous cholecystostomy tube placement with ultrasound and fluoroscopic guidance. Electronically Signed   By: DLucrezia EuropeM.D.   On: 01/09/2022 07:28  ? ?DG CHEST PORT 1 VIEW ? ?Result Date: 01/08/2022 ?CLINICAL DATA:  Dyspnea EXAM: PORTABLE CHEST 1 VIEW COMPARISON:  06/11/2021 FINDINGS: Lungs are clear. No pneumothorax or pleural effusion. Moderate hiatal hernia. Cardiac  size within normal limits. Pulmonary vascularity is normal. No acute bone  abnormality. IMPRESSION: No radiographic evidence of acute cardiopulmonary disease. Moderate hiatal hernia. Electronically Signed   By: Fidela Salisbury M.D.   On: 01/08/2022 18:46   ? ?Anti-infectives: ?An

## 2022-01-09 NOTE — Progress Notes (Addendum)
?PROGRESS NOTE ?Katie Weber  ZMO:[id number] DOB: [birth year] DOA: 01/07/2022 ?PCP: [facility]  ? ?Brief Narrative/Hospital Course: ?76 y.o. female with medical history significant of CAD, hypothyroidism, diverticulitis,HLD, seen by [facility] last few days ago had ultrasound of the abdomen done which shows gallstones with no definite signs of acute cholecystitis or choledocholithiasis -ultrasound suggested inflamed gallbladder so sent to the ED. patient was admitted for severe acute cholecystitis with cholelithiasis, General surgery GI was consulted.  Surgery advised percutaneous drainage and broad-spectrum antibiotic. IR was consulted.EKG showed new onset  atrial flutter/fib in the ED, started on heparin drip.  ?Patient underwent urgent IR percutaneous cholecystomy tube placement 01/08/22 givens patient's ongoing leukocytosis, hypoxia abnormal LFTs.  ?  ?Subjective: ?Seen and examined this morning.  She reports she feels much better no nausea vomiting, abdomen pain is improving, percutaneous cholecystomy is draining blackish greenish bile ?Still on nasal cannula 2 L ? ?Assessment and Plan: ?Principal Problem: ?  Biliary colic ?Active Problems: ?  New onset atrial flutter ([place]) ?  Hypothyroidism ?  Coronary artery calcification ?  Acute cholecystitis ? ?Acute cholecystitis with cholelithiasis ?Abnormal LFTs: ?CBD 4 mm with abnormal LFTs and normal lipase.General surgery GI was consulted.  Surgery advised percutaneous drainage and broad-spectrum antibiotic. IR was consulted.EKG showed new onset  atrial flutter/fib in the ED, started on heparin drip. Patient underwent urgent IR percutaneous cholecystomy tube placement 01/08/22.  Overnight afebrile but WBC slightly up, LFTs improving-but total bili went up, clinically improving.  Surgery following, continue on IV meropenem, diet per surgery. ? ?New onset a flutter?: Rate is controlled  patient on heparin for anticoagulation on admission.  Troponin are normal euthyroid TSH is 4.1.  Echocardiogram pending.  Previously seen by cardiology in 2018 Dr. Jobe Marker d/w cardiology.  After cardiology evaluation discussed, less likely a flutter most likely due to patient's baseline tremor, no A-flutter, will stop IV heparin. ? ?CAD ?HLD: ?Troponin negative, Crestor on hold, follow-up echo.   ?Hypothyroidism: Euthyroid continue Synthroid ?  ?Class I Obesity:Patient's Body mass index is 32.58 kg/m?. : Will benefit with PCP follow-up, weight loss  healthy lifestyle and outpatient sleep evaluation. ? ?DVT prophylaxis: SCDs Start: 01/07/22 2127 ?Code Status:   Code Status: Full Code ?Family Communication: plan of care discussed with patient at bedside. ?Patient status is: Inpatient level of care: Progressive, can change to tele ?Remains inpatient because: Ongoing management of cholecystitis and ?Patient currently not stable ? ?Dispo: The patient is from: home ?           Anticipated disposition: home hopefully next 1 to 2 days once cleared by surgery ? ?Mobility Assessment (last 72 hours)   ? ? Mobility Assessment   ? ? [place] Name 01/09/22 0818 01/08/22 2141 01/08/22 1209 01/07/22 2120  ?  ? Does patient have an order for bedrest or is patient medically unstable No - Continue assessment No - Continue assessment No - Continue assessment No - Continue assessment   ? What is the highest level of mobility based on the progressive mobility assessment? Level 5 (Walks with assist in room/hall) - Balance while stepping forward/back and can walk in room with assist - Complete -- Level 5 (Walks with assist in room/hall) - Balance while stepping forward/back and can walk in room with assist - Complete Level 5 (Walks with assist in room/hall) - Balance while stepping forward/back and can walk in room with assist - Complete   ? ?  ?  ? ?  ?  ? ?  Objective: ?Vitals last 24 hrs: ?Vitals:  ? 01/08/22 2126 01/08/22 2258 01/09/22 0245 01/09/22 4193  ?BP: 133/60 125/61 (!) 100/53  112/61  ?Pulse: 81 73 62 66  ?Resp: '20 16 14 18  '$ ?Temp: 98.9 ?F (37.2 ?C) 98.3 ?F (36.8 ?C) 97.9 ?F (36.6 ?C) 97.8 ?F (36.6 ?C)  ?TempSrc: Oral Oral Oral Oral  ?SpO2: 92% 93% 93% 93%  ?Weight:      ?Height:      ? ?Weight change:  ? ?Physical Examination: ?General exam: AA0x3,older than stated age, weak appearing. ?HEENT:Oral mucosa moist, Ear/Nose WNL grossly, dentition normal. ?Respiratory system: bilaterally diminished BS, no use of accessory muscle ?Cardiovascular system: S1 & S2 +, No JVD,. ?Gastrointestinal system: Abdomen soft, slightly tender right upper quadrant, percutaneous cholecystotomy tube in place, BS+  ?Nervous System:Alert, awake, moving extremities and grossly nonfocal ?Extremities: LE edema none,distal peripheral pulses palpable.  ?Skin: No rashes,no icterus. ?MSK: Normal muscle bulk,tone, power ? ?Medications reviewed:  ?Scheduled Meds: ? levothyroxine  100 mcg Oral Q0600  ? mouth rinse  15 mL Mouth Rinse BID  ? pantoprazole  40 mg Oral BID  ? sodium chloride flush  5 mL Intracatheter Q8H  ? ?Continuous Infusions: ? heparin 1,000 Units/hr (01/08/22 2206)  ? lactated ringers 100 mL/hr at 01/09/22 0245  ? meropenem (MERREM) IV 1 g (01/09/22 1022)  ? ? ?  ?Diet Order   ? ?       ?  Diet clear liquid Room service appropriate? Yes; Fluid consistency: Thin  Diet effective now       ?  ? ?  ?  ? ?  ?  ? ?  ?  ?  ? ? ?Intake/Output Summary (Last 24 hours) at 01/09/2022 1034 ?Last data filed at 01/09/2022 1002 ?Gross per 24 hour  ?Intake 1708.32 ml  ?Output 650 ml  ?Net 1058.32 ml  ? ?Net IO Since Admission: 1,580.42 mL [01/09/22 1034]  ?Wt Readings from Last 3 Encounters:  ?01/08/22 80.8 kg  ?08/09/21 81.2 kg  ?08/05/21 83.4 kg  ?  ? ?Unresulted Labs (From admission, onward)  ? ?  Start     Ordered  ? 01/10/22 0500  CBC  Daily,   R     ? 01/08/22 1906  ? 01/10/22 0500  Comprehensive metabolic panel  Tomorrow morning,   R       ? 01/09/22 1011  ? 01/09/22 1400  Heparin level (unfractionated)  Once-Timed,    TIMED       ? 01/09/22 0643  ? 01/08/22 2039  Aerobic/Anaerobic Culture w Gram Stain (surgical/deep wound)  Once,   R       ? 01/08/22 2040  ? ?  ?  ? ?  ?Data Reviewed: I have personally reviewed following labs and imaging studies ?CBC: ?Recent Labs  ?Lab 01-15-2022 ?1829 01/08/22 ?0159 01/09/22 ?7902  ?WBC 10.5 14.3* 15.9*  ?NEUTROABS  --   --  14.5*  ?HGB 12.1 12.7 11.7*  ?HCT 39.3 40.8 37.3  ?MCV 82.7 84.1 85.7  ?PLT 296 298 251  ? ?Basic Metabolic Panel: ?Recent Labs  ?Lab 01-15-22 ?1829 01/08/22 ?0159 01/09/22 ?4097  ?NA 141 138 137  ?K 3.9 3.9 4.2  ?CL 105 105 105  ?CO2 '27 26 26  '$ ?GLUCOSE 116* 143* 132*  ?BUN '10 8 14  '$ ?CREATININE 0.78 0.72 0.61  ?CALCIUM 9.7 9.1 8.6*  ?MG  --   --  2.1  ? ?GFR: ?Estimated Creatinine Clearance: 59.9 mL/min (by C-G formula based on  SCr of 0.61 mg/dL). ?Liver Function Tests: ?Recent Labs  ?Lab 01/07/22 ?1829 01/08/22 ?0159 01/09/22 ?7673  ?AST 283* 440* 114*  ?ALT 119* 233* 143*  ?ALKPHOS 178* 189* 140*  ?BILITOT 1.8* 2.9* 6.1*  ?PROT 7.6 7.3 6.6  ?ALBUMIN 4.2 3.8 3.0*  ? ?Recent Labs  ?Lab 01/07/22 ?[place] 01/08/22 ?0448  ?LIPASE 37 38  ? ?No results for input(s): AMMONIA in the last 168 hours. ?Coagulation Profile: ?Recent Labs  ?Lab 01/08/22 ?1913  ?INR 1.3*  ? ?BNP (last 3 results) ?No results for input(s): PROBNP in the last 8760 hours. ?HbA1C: ?No results for input(s): HGBA1C in the last 72 hours. ?CBG: ?No results for input(s): GLUCAP in the last 168 hours. ?Lipid Profile: ?No results for input(s): CHOL, HDL, LDLCALC, TRIG, CHOLHDL, LDLDIRECT in the last 72 hours. ?Thyroid Function Tests: ?Recent Labs  ?  01/07/22 ?2202  ?TSH 4.143  ? ?Sepsis Labs: ?Recent Labs  ?Lab 01/08/22 ?4193 01/08/22 ?2121  ?LATICACIDVEN 1.5 1.4  ? ? ?Recent Results (from the past 240 hour(s))  ?Urine Culture     Status: None  ? Collection Time: 01/08/22  2:15 AM  ? Specimen: Urine, Clean Catch  ?Result Value Ref Range Status  ? Specimen Description   Final  ?  URINE, CLEAN CATCH ?Performed at [facility], [place] [address] [place], [place] [address] ?  ? Special Requests   Final  ?  NONE ?Performed at [facility], [place] [address] [place], [place] [address] ?  ? Cultur

## 2022-01-09 NOTE — Progress Notes (Signed)
Patient breathing better, able to take deep breaths without any pain, off oxygen sating 93-95% on room air. Will continue to assess patient. ?

## 2022-01-09 NOTE — Hospital Course (Addendum)
76 y.o. female with medical history significant of CAD, hypothyroidism, diverticulitis,HLD, seen by [facility] last few days ago had ultrasound of the abdomen done which shows gallstones with no definite signs of acute cholecystitis or choledocholithiasis -ultrasound suggested inflamed gallbladder so sent to the ED. patient was admitted for severe acute cholecystitis with cholelithiasis, General surgery GI was consulted.  Surgery advised percutaneous drainage and broad-spectrum antibiotic. IR was consulted.EKG showed new onset  atrial flutter/fib in the ED, started on heparin drip.  ?Patient underwent urgent IR percutaneous cholecystomy tube placement 01/08/22 givens patient's ongoing leukocytosis, hypoxia abnormal LFTs. ?Since placement of drain tube patient has been clinically improving.  Has elevated total bilirubin but improving LFTs, total bili also downtrending.  Remains clinically stable afebrile, leukocytosis improving.  Patient will be discharged home once cleared by surgery with plan for outpatient follow-up with surgery and IR for drain management and further management of gallbladder.  Cleared by IR and surgery for discharge home with po antibiotics. ?

## 2022-01-09 NOTE — Progress Notes (Signed)
?  Transition of Care (TOC) Screening Note ? ? ?Patient Details  ?Name: Katie Weber ?Date of Birth: [birth year] ? ? ?Transition of Care [facility]) CM/SW Contact:    ?Dessa Phi, RN ?Phone Number: ?01/09/2022, 9:33 AM ? ? ? ?Transition of Care Department [facility]) has reviewed patient and no TOC needs have been identified at this time. We will continue to monitor patient advancement through interdisciplinary progression rounds. If new patient transition needs arise, please place a TOC consult. ?  ?

## 2022-01-09 NOTE — Progress Notes (Signed)
Echocardiogram ?2D Echocardiogram has been performed. ? ?Arlyss Gandy ?01/09/2022, 11:09 AM ?

## 2022-01-09 NOTE — Consult Note (Addendum)
?Cardiology Consultation:  ? ?Patient ID: Katie Weber ?MRN: [id number]; DOB: 07/24/1946 ? ?Admit date: 01/07/2022 ?Date of Consult: 01/09/2022 ? ?PCP:  [facility] ?  ?[facility] Providers ?Cardiologist:  None  ? ?Patient Profile:  ? ?Katie Weber is a 76 y.o. female with a history of coronary artery calcifications noted on CT cardiac scoring in 2018, hyperlipidemia, hypothyroidism, GERD, esophageal stenosis s/p dilatation, asthma, and diverticulitis who is being seen for evaluation of atrial fibrillation at the request of Dr. Lupita Leash. ? ?History of Present Illness:  ? ?Ms. Weber with the above history.  She was previously seen by Dr. Oval Linsey in 2018 for evaluation of hyperlipidemia and elevated ASCVD 10-year risk score.  She was hesitant to start statin at that time due to possible side effects. .  Coronary calcium score was ordered for further evaluation.  And came back elevated at 207 placing patient in the 82nd percentile for age and sex.  Coronary calcifications were noted in the left main, LAD, RCA, and LCx.  She was also started on aspirin and statin.  ETT was also ordered and was normal. She has not been seen by Cardiology since that time. ? ?Patient presented to the [facility] ED on 01/07/2022 for further evaluation of abdominal pain and was admitted with cholecystitis.  She was started on antibiotics and GI was consulted who recommended percutaneous drainage and then likely cholecystectomy in several weeks.  Patient underwent urgent IR percutaneous cholecystostomy tube placement on 01/08/2022.  Cardiology was consulted for new onset atrial fibrillation which was noted on EKG on admission. ? ?At the time of this evaluation, patient resting comfortably in no acute distress.  She is back in sinus rhythm at this time.  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  She is doing  much better since having the percutaneous drain placed yesterday.  Patient has had recurrent gallbladder issues since August 2022 but nothing this bad.  She developed acute right upper quadrant pain last Tuesday 01/03/2022.  She was seen by GI as an outpatient and underwent abdominal ultrasound on 01/05/2022 which showed severe gallbladder wall thickening with mild cholelithiasis and sludge within gallbladder lumen.  Pain worsened significantly on/10/2021 and she called the GI office who recommended she come to the ED.  She denies any cardiac symptoms though.  She was asymptomatic with her atrial fibrillation with no palpitations.  She denies any chest pain, shortness of breath, orthopnea, PND, lower extremity edema, lightheadedness, dizziness, syncope.  She has never been diagnosed with atrial fibrillation before.  She denies any recent fevers, nasal congestion, cough, nausea, vomiting, diarrhea. ? ?Past Medical History:  ?Diagnosis Date  ? Anemia   ? when pt was younger  ? Arthritis   ? arthritis fingers  ? Asthma   ? Coronary artery calcification 04/24/2017  ? Diverticulitis   ? x2 -last 1'17 tx. outpatient.  ? Environmental and seasonal allergies   ? 2014 Possibly related to air freshners.  ? 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  ? Family history of anesthesia complication 85IDP ago  ? daughter had an allergic reaction to atropine  ? GERD (gastroesophageal reflux disease)   ? no meds  ? History of hiatal hernia 09/14/2014  ? seen on xray  ? History of kidney stones   ? '80's lithotripsy-[facility] x1  ?  History of kidney stones   ? x1  ? Hyperlipidemia   ? Hypothyroidism   ? Pneumonia 2014  ? Pure hypercholesterolemia   ? Tremor of left hand   ? ? ?Past Surgical History:  ?Procedure Laterality Date  ? BALLOON DILATION N/A 05/04/2014  ? Procedure: BALLOON DILATION;  Surgeon: Garlan Fair, MD;  Location: [facility];  Service: Endoscopy;  Laterality: N/A;   ? BREAST LUMPECTOMY Right 1972  ? benign   ? BREAST SURGERY Right   ? '72 -benign tumor  ? childbirth- NVD x2     ? COLONOSCOPY WITH PROPOFOL N/A 05/04/2014  ? Procedure: COLONOSCOPY WITH PROPOFOL;  Surgeon: Garlan Fair, MD;  Location: [facility];  Service: Endoscopy;  Laterality: N/A;  ? DILATION AND CURETTAGE OF UTERUS    ? 04-20-14 [facility] hospital endometrial polyp removed.  ? ESOPHAGOGASTRODUODENOSCOPY (EGD) WITH PROPOFOL N/A 05/04/2014  ? Procedure: ESOPHAGOGASTRODUODENOSCOPY (EGD) WITH PROPOFOL;  Surgeon: Garlan Fair, MD;  Location: [facility];  Service: Endoscopy;  Laterality: N/A;. Procedure done in [place], [place]- 5 yrs ago.  ? HYSTEROSCOPY WITH D & C N/A 04/20/2014  ? Procedure: DILATATION AND CURETTAGE /HYSTEROSCOPY;  Surgeon: [name], MD;  Location: [facility];  Service: Gynecology;  Laterality: N/A;  ? IR PERC CHOLECYSTOSTOMY  01/08/2022  ? LITHOTRIPSY    ? POLYPECTOMY  2020  ? Dr. Lear Ng  ? 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 Procedure: LEFT TOTAL KNEE ARTHROPLASTY;  Surgeon: Mcarthur Rossetti, MD;  Location: [place];  Service: Orthopedics;  Laterality: Left;  ? TOTAL KNEE ARTHROPLASTY Right 08/09/2021  ? Procedure: RIGHT TOTAL KNEE ARTHROPLASTY;  Surgeon: Mcarthur Rossetti, MD;  Location: [place];  Service: Orthopedics;  Laterality: Right;  Needs RNFA  ? TUBAL LIGATION    ?  ? ?Home Medications:  ?Prior to Admission medications   ?Medication Sig Start Date End Date Taking? Authorizing Provider  ?alendronate (FOSAMAX) 70 MG tablet Take 70 mg by mouth once a week. Take with a full glass of water on an empty stomach. Take on Fridays   Yes [provider]  ?Camphor-Menthol-Methyl Sal (SALONPAS) 3.10-14-08 % PTCH Apply 1 application topically daily as needed (Pain).   Yes [provider]  ?Cholecalciferol (VITAMIN D) 50 MCG (2000 UT) tablet Take 2,000 Units by mouth 3 (three) times a week.   Yes [provider]  ?levothyroxine (SYNTHROID,  LEVOTHROID) 100 MCG tablet Take 100 mcg by mouth daily before breakfast.   Yes [provider]  ?ondansetron (ZOFRAN-ODT) 4 MG disintegrating tablet Take 4 mg by mouth every 8 (eight) hours as needed for nausea or vomiting. 01/04/22  Yes [provider]  ?pantoprazole (PROTONIX) 40 MG tablet Take 40 mg by mouth 2 (two) times daily. 01/04/22  Yes [provider]  ?rosuvastatin (CRESTOR) 10 MG tablet Take 10 mg by mouth 2 (two) times a week.   Yes [provider]  ?vitamin B-12 (CYANOCOBALAMIN) 1000 MCG tablet Take 1,000 mcg by mouth 3 (three) times a week.   Yes [provider]  ?vitamin C (ASCORBIC ACID) 500 MG tablet Take 500 mg by mouth 3 (three) times a week.   Yes [provider]  ?zinc gluconate 50 MG tablet Take 50 mg by mouth 3 (three) times a week.   Yes [provider]  ? ? ?Inpatient Medications: ?Scheduled Meds: ? levothyroxine  100 mcg Oral Q0600  ? mouth rinse  15 mL Mouth Rinse BID  ? pantoprazole  40  mg Oral BID  ? sodium chloride flush  5 mL Intracatheter Q8H  ? ?Continuous Infusions: ? heparin 1,000 Units/hr (01/08/22 2206)  ? lactated ringers 50 mL/hr at 01/09/22 1201  ? meropenem (MERREM) IV 1 g (01/09/22 1022)  ? ?PRN Meds: ?acetaminophen **OR** acetaminophen, diphenhydrAMINE, HYDROcodone-acetaminophen, metoprolol tartrate, morphine injection, ondansetron **OR** ondansetron (ZOFRAN) IV ? ?Allergies:    ?Allergies  ?Allergen Reactions  ? Contrast Media [Iodinated Contrast Media] Shortness Of Breath and Other (See Comments)  ?  Tightness in chest. Trouble breathing  ? Ciprofloxacin Itching and Rash  ?  Itching and rash on arm/chest  ? Clindamycin/Lincomycin Nausea Only  ? Penicillins Hives  ?  Marland KitchenMarland KitchenHas patient had a PCN reaction causing immediate rash, facial/tongue/throat swelling, SOB or lightheadedness with hypotension: No ?Has patient had a PCN reaction causing severe rash involving mucus membranes or skin necrosis: No ?Has patient had a  PCN reaction that required hospitalization No ?Has patient had a PCN reaction occurring within the last 10 years: No ?If all of the above answers are "NO", then may proceed with Cephalosporin use. ?  ? ? ?

## 2022-01-09 NOTE — Progress Notes (Signed)
ANTICOAGULATION CONSULT NOTE  ? ?Pharmacy Consult for IV heparin ?Indication: atrial fibrillation ? ?Allergies  ?Allergen Reactions  ? Contrast Media [Iodinated Contrast Media] Shortness Of Breath and Other (See Comments)  ?  Tightness in chest. Trouble breathing  ? Ciprofloxacin Itching and Rash  ?  Itching and rash on arm/chest  ? Clindamycin/Lincomycin Nausea Only  ? Penicillins Hives  ?  Marland KitchenMarland KitchenHas patient had a PCN reaction causing immediate rash, facial/tongue/throat swelling, SOB or lightheadedness with hypotension: No ?Has patient had a PCN reaction causing severe rash involving mucus membranes or skin necrosis: No ?Has patient had a PCN reaction that required hospitalization No ?Has patient had a PCN reaction occurring within the last 10 years: No ?If all of the above answers are "NO", then may proceed with Cephalosporin use. ?  ? ? ?Patient Measurements: ?Height: '5\' 2"'$  (157.5 cm) ?Weight: 80.8 kg (178 lb 2.1 oz) ?IBW/kg (Calculated) : 50.1 ?Heparin Dosing Weight: 68 kg  ? ?Vital Signs: ?Temp: 97.8 ?F (36.6 ?C) (04/03 1093) ?Temp Source: Oral (04/03 2355) ?BP: 112/61 (04/03 7322) ?Pulse Rate: 66 (04/03 0254) ? ?Labs: ?Recent Labs  ?  01/07/22 ?1829 01/08/22 ?0159 01/08/22 ?0448 01/08/22 ?1913 01/09/22 ?2706  ?HGB 12.1 12.7  --   --  11.7*  ?HCT 39.3 40.8  --   --  37.3  ?PLT 296 298  --   --  251  ?APTT  --   --   --  30  --   ?LABPROT  --   --   --  15.9*  --   ?INR  --   --   --  1.3*  --   ?HEPARINUNFRC  --   --   --   --  0.30  ?CREATININE 0.78 0.72  --   --  0.61  ?TROPONINIHS  --  6 6  --   --   ? ? ? ?Estimated Creatinine Clearance: 59.9 mL/min (by C-G formula based on SCr of 0.61 mg/dL). ? ? ?Medications:  ?Medications Prior to Admission  ?Medication Sig Dispense Refill Last Dose  ? alendronate (FOSAMAX) 70 MG tablet Take 70 mg by mouth once a week. Take with a full glass of water on an empty stomach. Take on Fridays   01/06/2022  ? Camphor-Menthol-Methyl Sal (SALONPAS) 3.10-14-08 % PTCH Apply 1 application  topically daily as needed (Pain).   unknown  ? Cholecalciferol (VITAMIN D) 50 MCG (2000 UT) tablet Take 2,000 Units by mouth 3 (three) times a week.   01/07/2022  ? levothyroxine (SYNTHROID, LEVOTHROID) 100 MCG tablet Take 100 mcg by mouth daily before breakfast.   01/07/2022  ? ondansetron (ZOFRAN-ODT) 4 MG disintegrating tablet Take 4 mg by mouth every 8 (eight) hours as needed for nausea or vomiting.   01/07/2022  ? pantoprazole (PROTONIX) 40 MG tablet Take 40 mg by mouth 2 (two) times daily.   01/07/2022  ? rosuvastatin (CRESTOR) 10 MG tablet Take 10 mg by mouth 2 (two) times a week.   Past Week  ? vitamin B-12 (CYANOCOBALAMIN) 1000 MCG tablet Take 1,000 mcg by mouth 3 (three) times a week.   01/07/2022  ? vitamin C (ASCORBIC ACID) 500 MG tablet Take 500 mg by mouth 3 (three) times a week.   01/07/2022  ? zinc gluconate 50 MG tablet Take 50 mg by mouth 3 (three) times a week.   01/07/2022  ? ? ?Assessment: ?Pharmacy  is consulted to dose IV heparin who has been diagnosed with atrial fibrillation. No noted anticoagulation  on med rec.  ? ?Pt also had to go emergently to IR for evaluation of emergent percutaneous drain.  ? ?Per consult ok to start heparin if cleared by IR. IR consult states ok to resume one hour after procedure if stable.  ? ?Clarified with Dr. Vernard Gambles ok to start if vital signs stable and no signs of bleeding.  ? ?Confirmed with RN that pt is stable and has not had any bleeding issues.  ? ?Today, 01/09/22 ?Heparin level 0.3- therapeutic on 1000 units/hr ?CBC: Hgb 11.7-slightly low, pltc WNL  ?No infusion related issues per RN ?RN reports pink-tinged urine that is unchanged from previous day ? ?Goal of Therapy:  ?Heparin level 0.3-0.7 units/ml ?Monitor platelets by anticoagulation protocol: Yes ?  ?Plan:  ?Continue Heparin infusion at 1000 units/hr  ?Check confirmatory HL at 1400 ?Daily heparin level & CBC while on heparin  ?Monitor for signs and symptoms of bleeding ? ?Netta Cedars, PharmD, BCPS ?01/09/2022  6:37 AM ? ? ? ? ?

## 2022-01-09 NOTE — Progress Notes (Signed)
[facility] Progress Note ? ?Katie Weber 76 y.o. 03/12/1946 ? ?CC:  Abdominal pain, elevated LFTs ? ? ?Subjective: ?Patient seen and examined in bed today. She is comfortable  ?Percutaneous tube placed on 01/08/2022. ?She is feeling much improved today, her abdominal pain has improved.  ?She had one normal bowel movement this morning.  ? ?ROS : Review of Systems  ?Constitutional:  Negative for chills, fever and weight loss.  ?HENT:  Negative for hearing loss and tinnitus.   ?Eyes:  Negative for blurred vision and double vision.  ?Respiratory:  Negative for cough and hemoptysis.   ?Cardiovascular:  Negative for chest pain and palpitations.  ?Gastrointestinal:  Negative for abdominal pain, blood in stool, constipation, diarrhea, heartburn, melena, nausea and vomiting.  ?Genitourinary:  Negative for dysuria and urgency.  ?Musculoskeletal:  Negative for myalgias and neck pain.  ?Skin:  Negative for itching and rash.  ?Neurological:  Negative for dizziness and headaches.  ?Endo/Heme/Allergies:  Negative for environmental allergies. Does not bruise/bleed easily.  ?Psychiatric/Behavioral:  Negative for depression and substance abuse.    ? ? ?Objective: ?Vital signs in last 24 hours: ?Vitals:  ? 01/09/22 0611 01/09/22 1216  ?BP: 112/61 114/63  ?Pulse: 66 77  ?Resp: 18   ?Temp: 97.8 ?F (36.6 ?C) 98.3 ?F (36.8 ?C)  ?SpO2: 93% 93%  ? ? ?Physical Exam: ? ?General:  Alert, cooperative, no distress, appears stated age  ?Head:  Normocephalic, without obvious abnormality, atraumatic  ?Eyes:  Anicteric sclera, EOM's intact  ?Lungs:   Clear to auscultation bilaterally, respirations unlabored  ?Heart:  Regular rate and rhythm, S1, S2 normal  ?Abdomen:   Soft, non-tender, bowel sounds active all four quadrants,  no masses, percutaneous drain in place  ?Extremities: Extremities normal, atraumatic, no  edema  ?Pulses: 2+ and symmetric  ? ? ?Lab Results: ?Recent Labs  ?  01/08/22 ?0159 01/09/22 ?3810  ?NA 138 137  ?K  3.9 4.2  ?CL 105 105  ?CO2 26 26  ?GLUCOSE 143* 132*  ?BUN 8 14  ?CREATININE 0.72 0.61  ?CALCIUM 9.1 8.6*  ?MG  --  2.1  ? ?Recent Labs  ?  01/08/22 ?0159 01/09/22 ?0609  ?AST 440* 114*  ?ALT 233* 143*  ?ALKPHOS 189* 140*  ?BILITOT 2.9* 6.1*  ?PROT 7.3 6.6  ?ALBUMIN 3.8 3.0*  ? ?Recent Labs  ?  01/08/22 ?0159 01/09/22 ?0609  ?WBC 14.3* 15.9*  ?NEUTROABS  --  14.5*  ?HGB 12.7 11.7*  ?HCT 40.8 37.3  ?MCV 84.1 85.7  ?PLT 298 251  ? ?Recent Labs  ?  01/08/22 ?1913  ?LABPROT 15.9*  ?INR 1.3*  ? ? ? ? ?Assessment ? Abdominal pain.  History consistent with prior biliary colic, which has evolved into what seems most likely cholecystitis. Symptoms now improved with percutaneous drainage. Elevated LFTs are now improving .  No evidence of choledocholithiasis on MRCP. ?Dysphagia.  ? ?HGB 11.7(12.7) Platelets 251 (298) ?AST 114(440) ALT 143 (233)  ?Alkphos 140 (189) TBili 6.1 (2.9) ? ? ?Plan: ?Continue to monitor LFTs for improvement ?She will need cholecystectomy with IOC in the future. If a stone is found on IOC will proceed with ERCP likely outpatient.  ?Continue with plan for EGD with dilation in the future.  ?[facility] will follow ? ?Charlott Rakes PA-C ?01/09/2022, 12:57 PM ? ?Contact #  [phone]  ?

## 2022-01-10 ENCOUNTER — Other Ambulatory Visit (HOSPITAL_COMMUNITY): Payer: Self-pay

## 2022-01-10 DIAGNOSIS — K8050 Calculus of bile duct without cholangitis or cholecystitis without obstruction: Secondary | ICD-10-CM | POA: Diagnosis not present

## 2022-01-10 LAB — COMPREHENSIVE METABOLIC PANEL
ALT: 92 U/L — ABNORMAL HIGH (ref 0–44)
AST: 50 U/L — ABNORMAL HIGH (ref 15–41)
Albumin: 2.9 g/dL — ABNORMAL LOW (ref 3.5–5.0)
Alkaline Phosphatase: 113 U/L (ref 38–126)
Anion gap: 4 — ABNORMAL LOW (ref 5–15)
BUN: 16 mg/dL (ref 8–23)
CO2: 28 mmol/L (ref 22–32)
Calcium: 8.6 mg/dL — ABNORMAL LOW (ref 8.9–10.3)
Chloride: 107 mmol/L (ref 98–111)
Creatinine, Ser: 0.76 mg/dL (ref 0.44–1.00)
GFR, Estimated: >60 mL/min (ref >60)
Glucose, Bld: 85 mg/dL (ref 70–99)
Potassium: 4 mmol/L (ref 3.5–5.1)
Sodium: 139 mmol/L (ref 135–145)
Total Bilirubin: 2.4 mg/dL — ABNORMAL HIGH (ref 0.3–1.2)
Total Protein: 6 g/dL — ABNORMAL LOW (ref 6.5–8.1)

## 2022-01-10 LAB — CBC
HCT: 34.4 % — ABNORMAL LOW (ref 36.0–46.0)
Hemoglobin: 10.6 g/dL — ABNORMAL LOW (ref 12.0–15.0)
MCH: 26.6 pg (ref 26.0–34.0)
MCHC: 30.8 g/dL (ref 30.0–36.0)
MCV: 86.2 fL (ref 80.0–100.0)
Platelets: 233 10*3/uL (ref 150–400)
RBC: 3.99 MIL/uL (ref 3.87–5.11)
RDW: 17.9 % — ABNORMAL HIGH (ref 11.5–15.5)
WBC: 12.5 10*3/uL — ABNORMAL HIGH (ref 4.0–10.5)
nRBC: 0 % (ref 0.0–0.2)

## 2022-01-10 MED ORDER — OXYCODONE HCL 5 MG PO TABS: 5.0000 mg | ORAL_TABLET | ORAL | Status: DC | PRN

## 2022-01-10 MED ORDER — OXYCODONE HCL 5 MG PO TABS
5.0000 mg | ORAL_TABLET | Freq: Four times a day (QID) | ORAL | 0 refills | Status: DC | PRN
Start: 2022-01-10 — End: 2022-02-28

## 2022-01-10 MED ORDER — ACETAMINOPHEN 500 MG PO TABS: 1000.0000 mg | ORAL_TABLET | Freq: Three times a day (TID) | ORAL | Status: DC

## 2022-01-10 MED ORDER — CEFPODOXIME PROXETIL 200 MG PO TABS
200.0000 mg | ORAL_TABLET | Freq: Two times a day (BID) | ORAL | Status: DC
  Administered 2022-01-10: 200 mg via ORAL
  Filled 2022-01-10: qty 1

## 2022-01-10 MED ORDER — METRONIDAZOLE 500 MG PO TABS: 500.0000 mg | ORAL_TABLET | Freq: Two times a day (BID) | ORAL | 0 refills | Status: AC

## 2022-01-10 MED ORDER — BOOST / RESOURCE BREEZE PO LIQD CUSTOM
1.0000 | Freq: Three times a day (TID) | ORAL | Status: DC
  Administered 2022-01-10: 1 via ORAL

## 2022-01-10 MED ORDER — CEFPODOXIME PROXETIL 200 MG PO TABS: 200.0000 mg | ORAL_TABLET | Freq: Two times a day (BID) | ORAL | 0 refills | Status: AC

## 2022-01-10 NOTE — Progress Notes (Signed)
Went over discharge papers with patient and family. All questions answered.  VSS. Teaching completed on drain with teach back.   ?

## 2022-01-10 NOTE — Discharge Summary (Signed)
Physician Discharge Summary  ?Katie Weber DGU:[id number] DOB: [birth year] DOA: 01/07/2022 ? ?PCP: [facility] ? ?Admit date: 01/07/2022 ?Discharge date: 01/10/2022 ?Recommendations for Outpatient Follow-up:  ?Follow up with PCP in 1 weeks-call for appointment ?Please obtain BMP/CBC in one week ? ?Discharge Dispo: home ?Discharge Condition: Stable ?Code Status:   Code Status: Full Code ?Diet recommendation:  ?Diet Order   ? ?       ?  Diet regular Room service appropriate? Yes; Fluid consistency: Thin  Diet effective now       ?  ? ?  ?  ? ?  ?  ? ?Brief/Interim Summary: ?76 y.o. female with medical history significant of CAD, hypothyroidism, diverticulitis,HLD, seen by [facility] last few days ago had ultrasound of the abdomen done which shows gallstones with no definite signs of acute cholecystitis or choledocholithiasis -ultrasound suggested inflamed gallbladder so sent to the ED. patient was admitted for severe acute cholecystitis with cholelithiasis, General surgery GI was consulted.  Surgery advised percutaneous drainage and broad-spectrum antibiotic. IR was consulted.EKG showed new onset  atrial flutter/fib in the ED, started on heparin drip.  ?Patient underwent urgent IR percutaneous cholecystomy tube placement 01/08/22 givens patient's ongoing leukocytosis, hypoxia abnormal LFTs. ?Since placement of drain tube patient has been clinically improving.  Has elevated total bilirubin but improving LFTs, total bili also downtrending.  Remains clinically stable afebrile, leukocytosis improving.  Patient will be discharged home once cleared by surgery with plan for outpatient follow-up with surgery and IR for drain management and further management of gallbladder.  Cleared by IR and surgery for discharge home with po antibiotics.  ? ?Discharge Diagnoses:  ?Principal Problem: ?  Biliary colic ?Active Problems: ?  New onset atrial flutter ([place]) ?  Hypothyroidism ?  Coronary artery  calcification ?  Acute cholecystitis ?Acute cholecystitis with cholelithiasis ?Abnormal LFTs: ?CBD 4 mm with abnormal LFTs and normal lipase.General surgery GI was consulted.  Surgery advised percutaneous drainage and broad-spectrum antibiotic. IR was consulted.EKG showed new onset  atrial flutter/fib in the ED, started on heparin drip. Patient underwent urgent IR percutaneous cholecystomy tube placement 01/08/22.  Now much improved no more abdominal pain, total bili LFTs downtrending, cleared for discharge by surgery with outpatient follow-up with IR and surgery, will discharge on antibiotics.  We will start cefpodoxime which patient tolerated well will discharge on Ciprodex and Flagyl for next 11 days ?  ?A-flutter ruled out by cardiology, ? ?CAD ?HLD: ?Troponin negative, resume Crestor on d/c ? ?Hypothyroidism: Euthyroid continue Synthroid ?  ?Class I Obesity:Patient's Body mass index is 32.58 kg/m?. : Will benefit with PCP follow-up, weight loss healthy lifestyle and outpatient sleep evaluation. ? ?Consults: ?General surgery, interventional radiology ?Subjective: ?Alert awake resting comfortably tolerating diet.  Afebrile,abdomen poin is better ? ?Discharge Exam: ?Vitals:  ? 01/10/22 1000 01/10/22 1100  ?BP:  125/69  ?Pulse:  81  ?Resp:  18  ?Temp:  97.7 ?F (36.5 ?C)  ?SpO2: 96% 96%  ? ?General: Pt is alert, awake, not in acute distress ?Cardiovascular: RRR, S1/S2 +, no rubs, no gallops ?Respiratory: CTA bilaterally, no wheezing, no rhonchi ?Abdominal: Soft, NT, ND, bowel sounds + ?Extremities: no edema, no cyanosis ? ?Discharge Instructions ? ?Discharge Instructions   ? ? Discharge instructions   Complete by: As directed ?  ? Follow-up with IR for drain management.  Follow-up with general surgery for gallbladder surgery.  Follow-up with PCP for blood work-up in 1 week. ? ?Please call call MD or return to  ER for similar or worsening recurring problem that brought you to hospital or if any  fever,nausea/vomiting,abdominal pain, uncontrolled pain, chest pain,  shortness of breath or any other alarming symptoms. ? ?Please follow-up your doctor as instructed in a week time and call the office for appointment. ? ?Please avoid alcohol, smoking, or any other illicit substance and maintain healthy habits including taking your regular medications as prescribed. ? ?You were cared for by a hospitalist during your hospital stay. If you have any questions about your discharge medications or the care you received while you were in the hospital after you are discharged, you can call the unit and ask to speak with the hospitalist on call if the hospitalist that took care of you is not available. ? ?Once you are discharged, your primary care physician will handle any further medical issues. Please note that NO REFILLS for any discharge medications will be authorized once you are discharged, as it is imperative that you return to your primary care physician (or establish a relationship with a primary care physician if you do not have one) for your aftercare needs so that they can reassess your need for medications and monitor your lab values  ? Discharge wound care:   Complete by: As directed ?  ? Reinforce dressing dressing as needed and follow-up with IR for drain care  ? Increase activity slowly   Complete by: As directed ?  ? ?  ? ?Allergies as of 01/10/2022   ? ?   Reactions  ? Contrast Media [iodinated Contrast Media] Shortness Of Breath, Other (See Comments)  ? Tightness in chest. Trouble breathing  ? Ciprofloxacin Itching, Rash  ? Itching and rash on arm/chest  ? Clindamycin/lincomycin Nausea Only  ? Penicillins Hives  ? Marland KitchenMarland KitchenHas patient had a PCN reaction causing immediate rash, facial/tongue/throat swelling, SOB or lightheadedness with hypotension: No ?Has patient had a PCN reaction causing severe rash involving mucus membranes or skin necrosis: No ?Has patient had a PCN reaction that required hospitalization  No ?Has patient had a PCN reaction occurring within the last 10 years: No ?If all of the above answers are "NO", then may proceed with Cephalosporin use.  ? ?  ? ?  ?Medication List  ?  ? ?TAKE these medications   ? ?alendronate 70 MG tablet ?Commonly known as: FOSAMAX ?Take 70 mg by mouth once a week. Take with a full glass of water on an empty stomach. Take on Fridays ?  ?cefpodoxime 200 MG tablet ?Commonly known as: VANTIN ?Take 1 tablet (200 mg total) by mouth every 12 (twelve) hours for 11 days. ?  ?levothyroxine 100 MCG tablet ?Commonly known as: SYNTHROID ?Take 100 mcg by mouth daily before breakfast. ?  ?metroNIDAZOLE 500 MG tablet ?Commonly known as: Flagyl ?Take 1 tablet (500 mg total) by mouth 2 (two) times daily for 11 days. ?  ?ondansetron 4 MG disintegrating tablet ?Commonly known as: ZOFRAN-ODT ?Take 4 mg by mouth every 8 (eight) hours as needed for nausea or vomiting. ?  ?oxyCODONE 5 MG immediate release tablet ?Commonly known as: Oxy IR/ROXICODONE ?Take 1 tablet (5 mg total) by mouth every 6 (six) hours as needed for severe pain. ?  ?pantoprazole 40 MG tablet ?Commonly known as: PROTONIX ?Take 40 mg by mouth 2 (two) times daily. ?  ?rosuvastatin 10 MG tablet ?Commonly known as: CRESTOR ?Take 10 mg by mouth 2 (two) times a week. ?  ?Salonpas 3.10-14-08 % Ptch ?Generic drug: Camphor-Menthol-Methyl Sal ?Apply 1 application topically  daily as needed (Pain). ?  ?vitamin B-12 1000 MCG tablet ?Commonly known as: CYANOCOBALAMIN ?Take 1,000 mcg by mouth 3 (three) times a week. ?  ?vitamin C 500 MG tablet ?Commonly known as: ASCORBIC ACID ?Take 500 mg by mouth 3 (three) times a week. ?  ?Vitamin D 50 MCG (2000 UT) tablet ?Take 2,000 Units by mouth 3 (three) times a week. ?  ?zinc gluconate 50 MG tablet ?Take 50 mg by mouth 3 (three) times a week. ?  ? ?  ? ?  ?  ? ? ?  ?Discharge Care Instructions  ?(From admission, onward)  ?  ? ? ?  ? ?  Start     Ordered  ? 01/10/22 0000  Discharge wound care:        ?Comments: Reinforce dressing dressing as needed and follow-up with IR for drain care  ? 01/10/22 1116  ? ?  ?  ? ?  ? ? Follow-up Information   ? ? Jovita Kussmaul, MD. Go to.   ?Specialty: General Surgery ?Why: Your appointment is 5/2 at 9am to discu

## 2022-01-10 NOTE — TOC Benefit Eligibility Note (Signed)
Patient Advocate Encounter ? ?Insurance verification completed.   ? ?The patient is currently admitted and upon discharge could be taking cefpodoxime (Vantin) 200 mg tablets. ? ?The current 12 day co-pay is, $8.00.  ? ?The patient is insured through [employer] Medicare Part D  ? ? ? ?Lyndel Safe, CPhT ?Pharmacy Patient Advocate Specialist ?[facility] Patient Advocate Team ?Direct Number: [phone]  Fax: [phone] ? ? ? ? ? ?  ?

## 2022-01-10 NOTE — Progress Notes (Signed)
Patient ID: Katie Weber, female   DOB: 1946-05-06, 76 y.o.   MRN: [id number] ?Pt s/p perc GB drain 01/08/22; currently stable, awaiting dc home; WBC 12.5(15.9), bile cx with few klebsiella; drain intact, output 275 cc green bile; pt instructed on how to flush/manage drain/record output daily; will schedule her for f/u cholangiogram in 6 weeks. Pt can call IR at [phone] or [phone] and ask to speak with IR PA or IR MD on call with any drain related questions.  ?

## 2022-01-10 NOTE — Progress Notes (Signed)
[facility] Progress Note ? ?Katie Weber 76 y.o. 24-Feb-1946 ? ?CC:  abdominal pain, elevated LFTs ? ?Subjective: ?Patient seen and examined in bed today. She is comfortable  ?Percutaneous tube placed on 01/08/2022. ?She is continuing to feel improved today.  ?She is packing up to go home.  ? ?ROS : Review of Systems  ?Constitutional:  Negative for chills and fever.  ?Eyes:  Negative for blurred vision and double vision.  ?Gastrointestinal:  Negative for abdominal pain, blood in stool, constipation, diarrhea, heartburn, melena, nausea and vomiting.  ?Genitourinary:  Negative for dysuria and urgency.  ?Neurological:  Negative for dizziness and headaches.  ? ? ? ?Objective: ?Vital signs in last 24 hours: ?Vitals:  ? 01/10/22 1000 01/10/22 1100  ?BP:  125/69  ?Pulse:  81  ?Resp:  18  ?Temp:  97.7 ?F (36.5 ?C)  ?SpO2: 96% 96%  ? ? ?Physical Exam: ? ?General:  Alert, cooperative, no distress, appears stated age, jaundiced  ?Head:  Normocephalic, without obvious abnormality, atraumatic  ?Eyes:  Anicteric sclera, EOM's intact, scleral icterus   ?Lungs:   Clear to auscultation bilaterally, respirations unlabored  ?Heart:  Regular rate and rhythm, S1, S2 normal  ?Abdomen:   Soft, non-tender, bowel sounds active all four quadrants,  no masses,   ?Extremities: Extremities normal, atraumatic, no  edema  ?Pulses: 2+ and symmetric  ? ? ?Lab Results: ?Recent Labs  ?  01/09/22 ?6060 01/10/22 ?0425  ?NA 137 139  ?K 4.2 4.0  ?CL 105 107  ?CO2 26 28  ?GLUCOSE 132* 85  ?BUN 14 16  ?CREATININE 0.61 0.76  ?CALCIUM 8.6* 8.6*  ?MG 2.1  --   ? ?Recent Labs  ?  01/09/22 ?0609 01/10/22 ?0425  ?AST 114* 50*  ?ALT 143* 92*  ?ALKPHOS 140* 113  ?BILITOT 6.1* 2.4*  ?PROT 6.6 6.0*  ?ALBUMIN 3.0* 2.9*  ? ?Recent Labs  ?  01/09/22 ?0609 01/10/22 ?0425  ?WBC 15.9* 12.5*  ?NEUTROABS 14.5*  --   ?HGB 11.7* 10.6*  ?HCT 37.3 34.4*  ?MCV 85.7 86.2  ?PLT 251 233  ? ?Recent Labs  ?  01/08/22 ?1913  ?LABPROT 15.9*  ?INR 1.3*   ? ? ? ? ?Assessment ?Acute cholecystitis ?  ?HGB 10.6(11.7) Platelets 045(997) ?AST 50(114) ALT 92(143)  ?Alkphos 113(140) TBili 2.4(6.1) ? ?s/p cholecystostomy tube yesterday. Significant improvement in symptoms. TB dropping as well as AST/ALT/ALP improving.   ?Plan: ?Patient discharged home, [facility] will sign off recommend f/u outpatient for dysphagia.  ?Please contact [facility] with any concerns or if needed after cholecystectomy.  ? ?Charlott Rakes PA-C ?01/10/2022, 2:40 PM ? ?Contact #  [phone]  ?

## 2022-01-10 NOTE — Progress Notes (Signed)
[facility] ?Progress Note ? ?   ?Subjective: ?CC-  ?Having some increased RUQ discomfort this morning. States that she felt great yesterday and thinks she just over did it ambulating. Tolerating liquids without n/v. Passing flatus. ?WBC trending down 12.5, afebrile. LFTs trending down.  ? ?Objective: ?Vital signs in last 24 hours: ?Temp:  [98.3 ?F (36.8 ?C)-98.8 ?F (37.1 ?C)] 98.8 ?F (37.1 ?C) (04/04 0700) ?Pulse Rate:  [71-85] 85 (04/04 0700) ?Resp:  [17-18] 18 (04/04 0700) ?BP: (114-158)/(61-74) 158/74 (04/04 0700) ?SpO2:  [90 %-95 %] 95 % (04/04 0812) ?Last BM Date : 01/07/22 ? ?Intake/Output from previous day: ?04/03 0701 - 04/04 0700 ?In: 1779.6 [P.O.:560; I.V.:1009.6; IV Piggyback:200] ?Out: 0347 [Urine:1550; Drains:275] ?Intake/Output this shift: ?No intake/output data recorded. ? ?PE: ?Gen:  Alert, NAD, pleasant ?Abd: soft, ND, mild RUQ TTP around drain, perc chole tube in place with bilious fluid in bag ? ?Lab Results:  ?Recent Labs  ?  01/09/22 ?4259 01/10/22 ?0425  ?WBC 15.9* 12.5*  ?HGB 11.7* 10.6*  ?HCT 37.3 34.4*  ?PLT 251 233  ? ?BMET ?Recent Labs  ?  01/09/22 ?0609 01/10/22 ?0425  ?NA 137 139  ?K 4.2 4.0  ?CL 105 107  ?CO2 26 28  ?GLUCOSE 132* 85  ?BUN 14 16  ?CREATININE 0.61 0.76  ?CALCIUM 8.6* 8.6*  ? ?PT/INR ?Recent Labs  ?  01/08/22 ?1913  ?LABPROT 15.9*  ?INR 1.3*  ? ?CMP  ?   ?Component Value Date/Time  ? NA 139 01/10/2022 0425  ? K 4.0 01/10/2022 0425  ? CL 107 01/10/2022 0425  ? CO2 28 01/10/2022 0425  ? GLUCOSE 85 01/10/2022 0425  ? BUN 16 01/10/2022 0425  ? CREATININE 0.76 01/10/2022 0425  ? CALCIUM 8.6 (L) 01/10/2022 0425  ? PROT 6.0 (L) 01/10/2022 0425  ? ALBUMIN 2.9 (L) 01/10/2022 0425  ? AST 50 (H) 01/10/2022 0425  ? ALT 92 (H) 01/10/2022 0425  ? ALKPHOS 113 01/10/2022 0425  ? BILITOT 2.4 (H) 01/10/2022 0425  ? GFRNONAA >60 01/10/2022 0425  ? GFRAA >60 10/16/2015 1710  ? ?Lipase  ?   ?Component Value Date/Time  ? LIPASE 38 01/08/2022 0448  ? ? ? ? ? ?Studies/Results: ?MR  ABDOMEN MRCP WO CONTRAST ? ?Result Date: 01/08/2022 ?CLINICAL DATA:  Cholelithiasis. EXAM: MRI ABDOMEN WITHOUT CONTRAST  (INCLUDING MRCP) TECHNIQUE: Multiplanar multisequence MR imaging of the abdomen was performed. Heavily T2-weighted images of the biliary and pancreatic ducts were obtained, and three-dimensional MRCP images were rendered by post processing. COMPARISON:  Ultrasound exam 01/05/2022.  CT scan 04/25/2021 FINDINGS: Lower chest: Unremarkable. Hepatobiliary: Tiny T2 hyperintensity in the dome of the left liver corresponds to a similar tiny hypodensity on previous CT. This is most suggestive of a tiny hepatic cyst. No follow-up recommended. Gallbladder is distended with gallbladder wall thickening and pericholecystic edema. Layering tiny gallstones evident measuring in the 3-5 mm size range. No intrahepatic or extrahepatic biliary dilation. No choledocholithiasis Pancreas: No focal mass lesion. No dilatation of the main duct. No intraparenchymal cyst. No peripancreatic edema. Spleen:  No splenomegaly. No focal mass lesion. Adrenals/Urinary Tract: No adrenal nodule or mass. Left kidney unremarkable. 5.3 cm simple exophytic cyst noted interpolar right kidney, stable since prior CT. No followup recommended. Stomach/Bowel: Moderate to large hiatal hernia. Duodenum is normally positioned as is the ligament of Treitz. No small bowel or colonic dilatation within the visualized abdomen. Vascular/Lymphatic: No abdominal aortic aneurysm. No abdominal lymphadenopathy Other:  Trace free fluid adjacent to the liver. Musculoskeletal:  No suspicious marrow signal abnormality. IMPRESSION: 1. Cholelithiasis with gallbladder wall thickening and pericholecystic edema. Imaging features compatible with acute cholecystitis. 2. No intrahepatic or extrahepatic biliary dilation. No choledocholithiasis. 3. Moderate to large hiatal hernia. Electronically Signed   By: Misty Stanley M.D.   On: 01/08/2022 09:34  ? ?MR 3D Recon At  Scanner ? ?Result Date: 01/08/2022 ?CLINICAL DATA:  Cholelithiasis. EXAM: MRI ABDOMEN WITHOUT CONTRAST  (INCLUDING MRCP) TECHNIQUE: Multiplanar multisequence MR imaging of the abdomen was performed. Heavily T2-weighted images of the biliary and pancreatic ducts were obtained, and three-dimensional MRCP images were rendered by post processing. COMPARISON:  Ultrasound exam 01/05/2022.  CT scan 04/25/2021 FINDINGS: Lower chest: Unremarkable. Hepatobiliary: Tiny T2 hyperintensity in the dome of the left liver corresponds to a similar tiny hypodensity on previous CT. This is most suggestive of a tiny hepatic cyst. No follow-up recommended. Gallbladder is distended with gallbladder wall thickening and pericholecystic edema. Layering tiny gallstones evident measuring in the 3-5 mm size range. No intrahepatic or extrahepatic biliary dilation. No choledocholithiasis Pancreas: No focal mass lesion. No dilatation of the main duct. No intraparenchymal cyst. No peripancreatic edema. Spleen:  No splenomegaly. No focal mass lesion. Adrenals/Urinary Tract: No adrenal nodule or mass. Left kidney unremarkable. 5.3 cm simple exophytic cyst noted interpolar right kidney, stable since prior CT. No followup recommended. Stomach/Bowel: Moderate to large hiatal hernia. Duodenum is normally positioned as is the ligament of Treitz. No small bowel or colonic dilatation within the visualized abdomen. Vascular/Lymphatic: No abdominal aortic aneurysm. No abdominal lymphadenopathy Other:  Trace free fluid adjacent to the liver. Musculoskeletal: No suspicious marrow signal abnormality. IMPRESSION: 1. Cholelithiasis with gallbladder wall thickening and pericholecystic edema. Imaging features compatible with acute cholecystitis. 2. No intrahepatic or extrahepatic biliary dilation. No choledocholithiasis. 3. Moderate to large hiatal hernia. Electronically Signed   By: Misty Stanley M.D.   On: 01/08/2022 09:34  ? ?IR Perc Cholecystostomy ? ?Result Date:  01/09/2022 ?CLINICAL DATA:  Cholelithiasis, acute cholecystitis EXAM: PERCUTANEOUS CHOLECYSTOSTOMY TUBE PLACEMENT WITH ULTRASOUND AND FLUOROSCOPIC GUIDANCE FLUOROSCOPY: Radiation Exposure Index (as provided by the fluoroscopic device): 8 mGy air Kerma TECHNIQUE: The procedure, risks (including but not limited to bleeding, infection, organ damage ), benefits, and alternatives were explained to the patient. Questions regarding the procedure were encouraged and answered. The patient understands and consents to the procedure. Survey ultrasound of the abdomen was performed and an appropriate skin entry site was identified. Skin site was marked, prepped with Betadine, and draped in usual sterile fashion, and infiltrated locally with 1% lidocaine. Intravenous Fentanyl 3mg and Versed '1mg'$  were administered as conscious sedation during continuous monitoring of the patient's level of consciousness and physiological / cardiorespiratory status by the radiology RN, with a total moderate sedation time of 10 minutes. Under real-time ultrasound guidance, gallbladder was accessed using a transhepatic approach with a 21-gauge needle. Ultrasound image documentation was saved. Bile returned through the hub. Needle was exchanged over a 018 guidewire for transitional dilator which allowed placement of 035 J wire. Over this, a 10.2 French pigtail catheter was advanced and formed centrally in the gallbladder lumen. Small contrast injection confirmed appropriate position. Catheter secured externally with 0 Prolene suture and placed external drain bag. Patient tolerated the procedure well. COMPLICATIONS: COMPLICATIONS none IMPRESSION: 1. Technically successful percutaneous cholecystostomy tube placement with ultrasound and fluoroscopic guidance. Electronically Signed   By: DLucrezia EuropeM.D.   On: 01/09/2022 07:28  ? ?DG CHEST PORT 1 VIEW ? ?Result Date: 01/08/2022 ?CLINICAL DATA:  Dyspnea EXAM:  PORTABLE CHEST 1 VIEW COMPARISON:  06/11/2021  FINDINGS: Lungs are clear. No pneumothorax or pleural effusion. Moderate hiatal hernia. Cardiac size within normal limits. Pulmonary vascularity is normal. No acute bone abnormality. IMPRESSION: No radiographic ev

## 2022-01-11 NOTE — Progress Notes (Signed)
01/11/2022  12:45pm:   ? ?Received call from patient and caregiver for assistance and clarification of flushing percutaneous drain.  Patient (and caregiver) unable to disconnect tube at the flush site. Patient brought by caregiver to hospital for RN support and reinforced teaching on how to complete flushes. Patient and caregiver acknowledged understanding of drain care including emptying of drainage and flushes. Taisia Fantini, Laurel Dimmer, RN  ?

## 2022-01-13 ENCOUNTER — Other Ambulatory Visit: Payer: Self-pay

## 2022-01-13 ENCOUNTER — Encounter (HOSPITAL_COMMUNITY): Payer: Self-pay

## 2022-01-13 ENCOUNTER — Emergency Department (HOSPITAL_COMMUNITY): Payer: Medicare Other

## 2022-01-13 ENCOUNTER — Emergency Department (HOSPITAL_COMMUNITY)
Admission: EM | Admit: 2022-01-13 | Discharge: 2022-01-13 | Disposition: A | Discharge status: Home / Self Care | Payer: Medicare Other | Attending: Emergency Medicine | Admitting: Emergency Medicine

## 2022-01-13 DIAGNOSIS — E876 Hypokalemia: Secondary | ICD-10-CM | POA: Insufficient documentation

## 2022-01-13 DIAGNOSIS — T85590A Other mechanical complication of bile duct prosthesis, initial encounter: Secondary | ICD-10-CM | POA: Diagnosis present

## 2022-01-13 DIAGNOSIS — D72829 Elevated white blood cell count, unspecified: Secondary | ICD-10-CM | POA: Diagnosis not present

## 2022-01-13 DIAGNOSIS — R109 Unspecified abdominal pain: Secondary | ICD-10-CM | POA: Diagnosis not present

## 2022-01-13 DIAGNOSIS — Z4803 Encounter for change or removal of drains: Secondary | ICD-10-CM | POA: Diagnosis not present

## 2022-01-13 DIAGNOSIS — Z5189 Encounter for other specified aftercare: Secondary | ICD-10-CM

## 2022-01-13 LAB — URINALYSIS, ROUTINE W REFLEX MICROSCOPIC
Bacteria, UA: NONE SEEN
Bilirubin Urine: NEGATIVE
Glucose, UA: NEGATIVE mg/dL
Hgb urine dipstick: NEGATIVE
Ketones, ur: 20 mg/dL — AB
Nitrite: NEGATIVE
Protein, ur: NEGATIVE mg/dL
Specific Gravity, Urine: 1.029 (ref 1.005–1.030)
pH: 5 (ref 5.0–8.0)

## 2022-01-13 LAB — CBC WITH DIFFERENTIAL/PLATELET
Abs Immature Granulocytes: 0.09 10*3/uL — ABNORMAL HIGH (ref 0.00–0.07)
Basophils Absolute: 0.1 10*3/uL (ref 0.0–0.1)
Basophils Relative: 1 %
Eosinophils Absolute: 0.1 10*3/uL (ref 0.0–0.5)
Eosinophils Relative: 1 %
HCT: 38.7 % (ref 36.0–46.0)
Hemoglobin: 12.3 g/dL (ref 12.0–15.0)
Immature Granulocytes: 1 %
Lymphocytes Relative: 18 %
Lymphs Abs: 2 10*3/uL (ref 0.7–4.0)
MCH: 26.6 pg (ref 26.0–34.0)
MCHC: 31.8 g/dL (ref 30.0–36.0)
MCV: 83.6 fL (ref 80.0–100.0)
Monocytes Absolute: 1.3 10*3/uL — ABNORMAL HIGH (ref 0.1–1.0)
Monocytes Relative: 12 %
Neutro Abs: 7.7 10*3/uL (ref 1.7–7.7)
Neutrophils Relative %: 67 %
Platelets: 339 10*3/uL (ref 150–400)
RBC: 4.63 MIL/uL (ref 3.87–5.11)
RDW: 17.9 % — ABNORMAL HIGH (ref 11.5–15.5)
WBC: 11.3 10*3/uL — ABNORMAL HIGH (ref 4.0–10.5)
nRBC: 0 % (ref 0.0–0.2)

## 2022-01-13 LAB — COMPREHENSIVE METABOLIC PANEL
ALT: 40 U/L (ref 0–44)
AST: 22 U/L (ref 15–41)
Albumin: 3.2 g/dL — ABNORMAL LOW (ref 3.5–5.0)
Alkaline Phosphatase: 96 U/L (ref 38–126)
Anion gap: 8 (ref 5–15)
BUN: 15 mg/dL (ref 8–23)
CO2: 27 mmol/L (ref 22–32)
Calcium: 9 mg/dL (ref 8.9–10.3)
Chloride: 104 mmol/L (ref 98–111)
Creatinine, Ser: 0.77 mg/dL (ref 0.44–1.00)
GFR, Estimated: >60 mL/min (ref >60)
Glucose, Bld: 110 mg/dL — ABNORMAL HIGH (ref 70–99)
Potassium: 3.3 mmol/L — ABNORMAL LOW (ref 3.5–5.1)
Sodium: 139 mmol/L (ref 135–145)
Total Bilirubin: 1 mg/dL (ref 0.3–1.2)
Total Protein: 7.1 g/dL (ref 6.5–8.1)

## 2022-01-13 LAB — I-STAT CHEM 8, ED
BUN: 12 mg/dL (ref 8–23)
Calcium, Ion: 1.22 mmol/L (ref 1.15–1.40)
Chloride: 101 mmol/L (ref 98–111)
Creatinine, Ser: 0.7 mg/dL (ref 0.44–1.00)
Glucose, Bld: 107 mg/dL — ABNORMAL HIGH (ref 70–99)
HCT: 40 % (ref 36.0–46.0)
Hemoglobin: 13.6 g/dL (ref 12.0–15.0)
Potassium: 3.3 mmol/L — ABNORMAL LOW (ref 3.5–5.1)
Sodium: 140 mmol/L (ref 135–145)
TCO2: 26 mmol/L (ref 22–32)

## 2022-01-13 IMAGING — CT CT ABD-PELV W/O CM
2 of 4 series · 16 of 46 positions shown, 18 images · non-contrast
Comparison: [DATE]

CLINICAL DATA: Acute abdominal pain



[Series 2: axial st · axial · 0.74mm/px · z∈[+1161,+1536]mm · 13 of 85 slices shown, 15 images]
[im 5/85  soft-tissue]
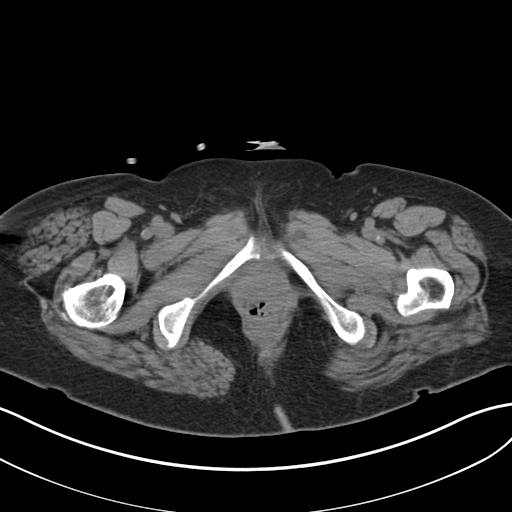
[im 5/85  bone]
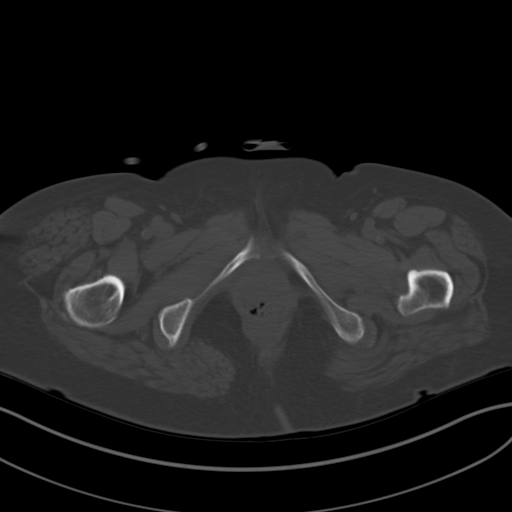
[im 13/85  soft-tissue]
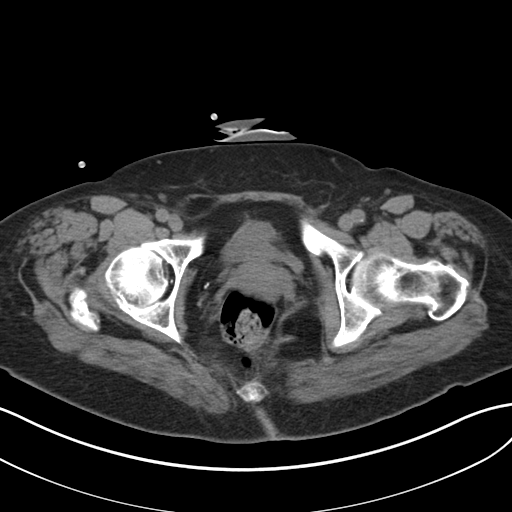
[im 17/85  soft-tissue]
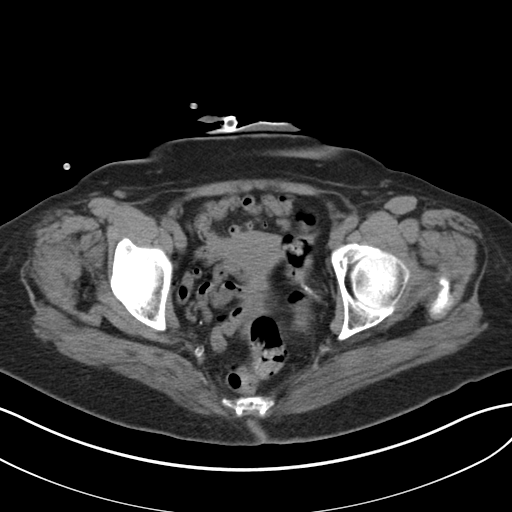
[im 26/85  soft-tissue]
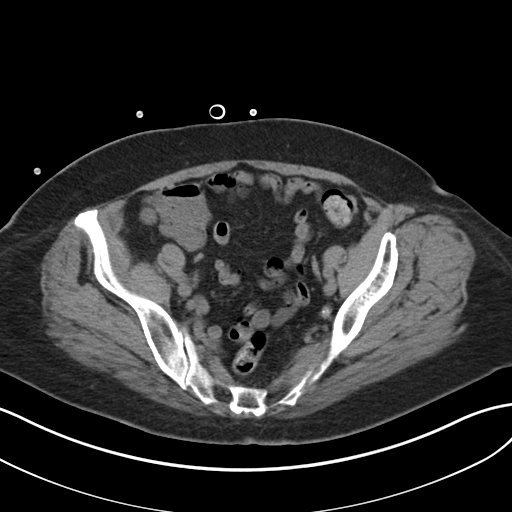
[im 30/85  soft-tissue]
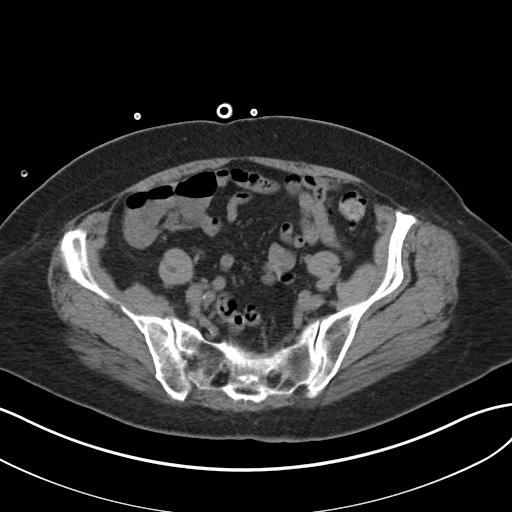
[im 38/85  soft-tissue]
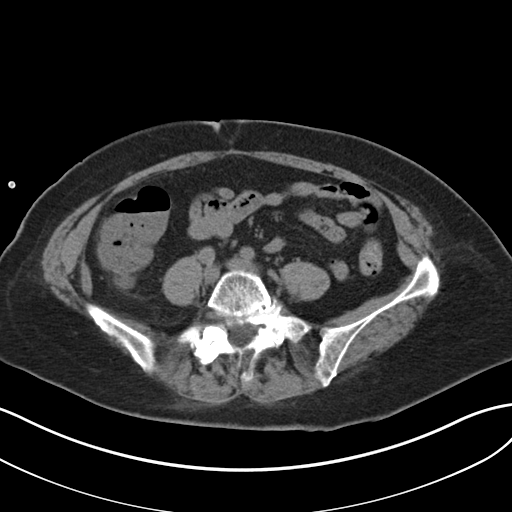
[im 43/85  soft-tissue]
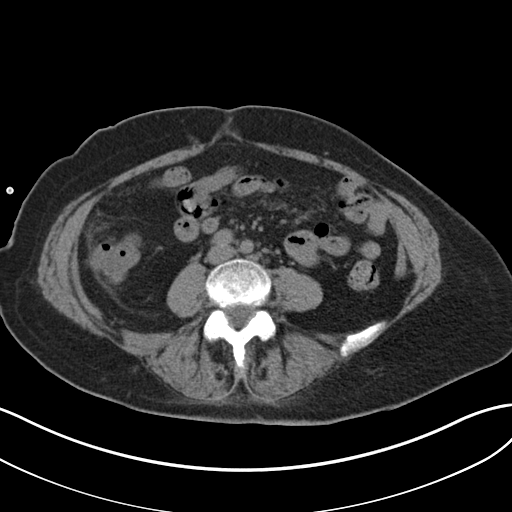
[im 47/85  soft-tissue]
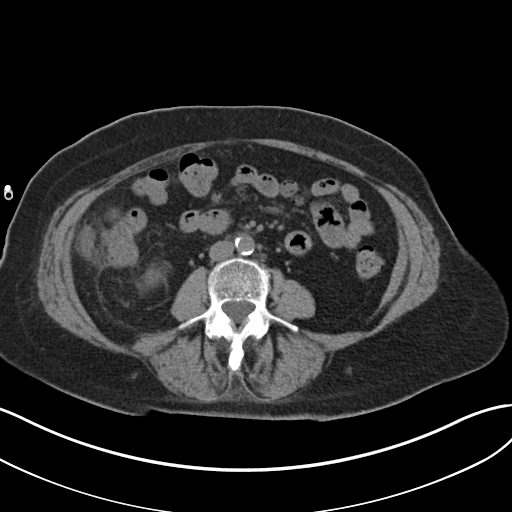
[im 55/85  soft-tissue]
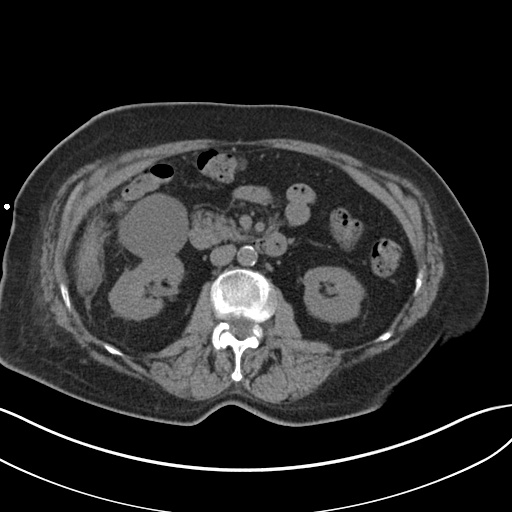
[im 55/85  bone]
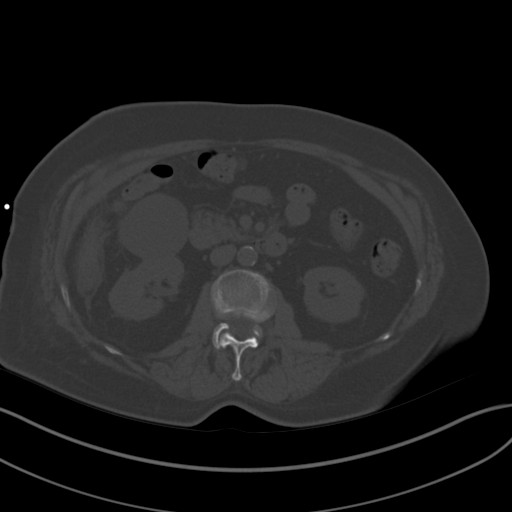
[im 59/85  soft-tissue]
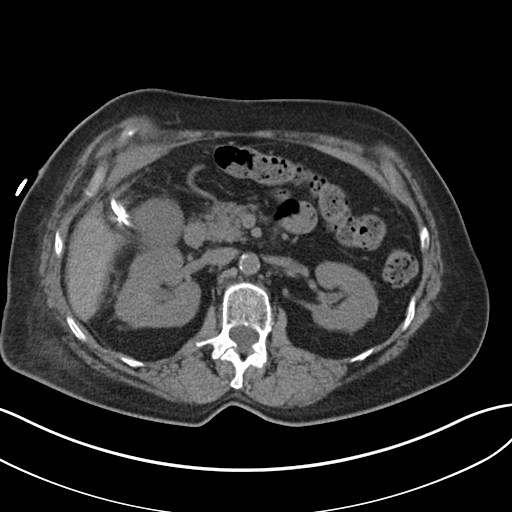
[im 68/85  soft-tissue]
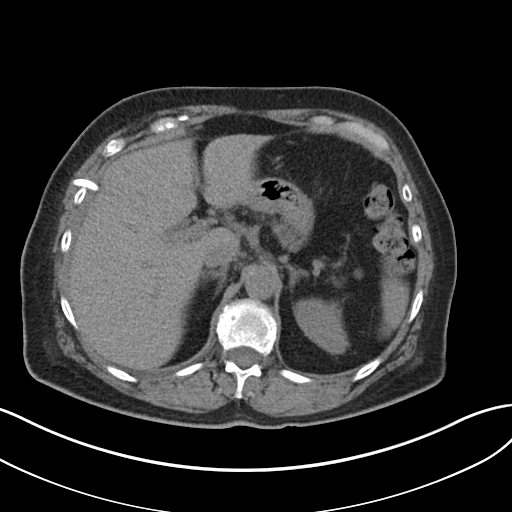
[im 72/85  soft-tissue]
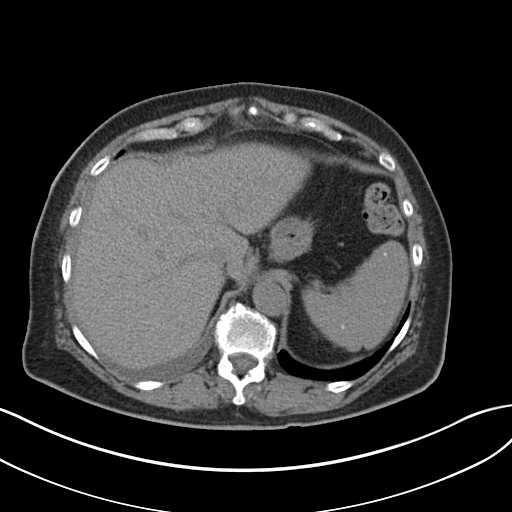
[im 80/85  soft-tissue]
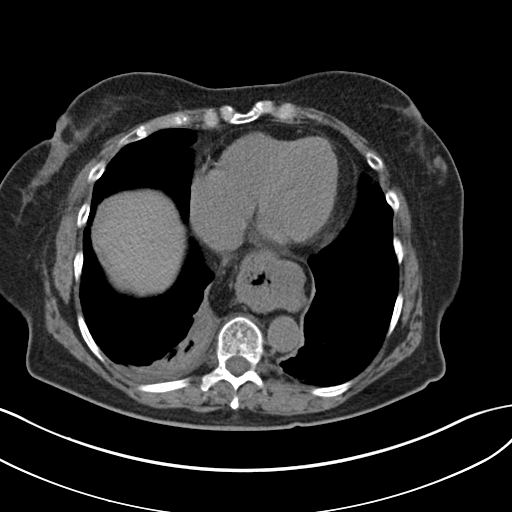

[Series 4: coronal st · coronal · 0.78mm/px · 3 of 136 slices shown]
[im 46/136  soft-tissue]
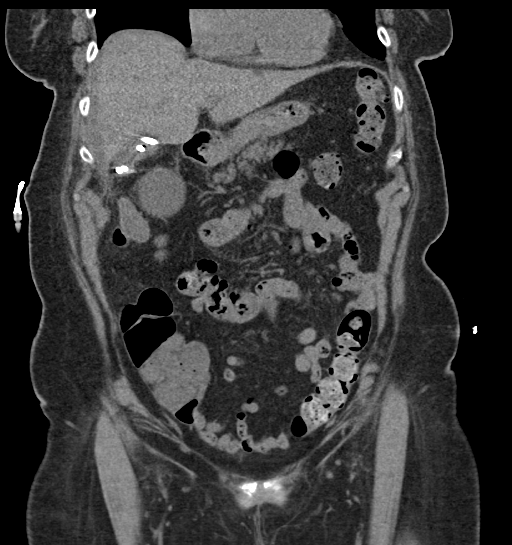
[im 61/136  soft-tissue]
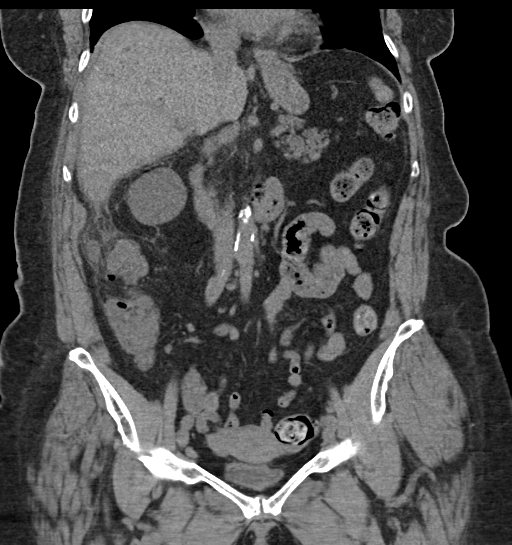
[im 76/136  soft-tissue]
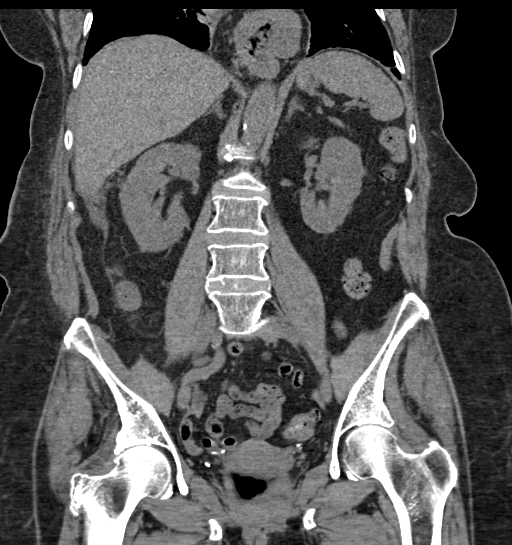

[16 of 46 positions shown; findings below may reference images not displayed]

FINDINGS: Lower Chest: Right basilar atelectasis

Hepatobiliary: Normal hepatic contours. No intra- or extrahepatic
biliary dilatation. Adjacent to the inferior tip of the liver there
is inflammatory stranding and a small fluid collection that measures
approximately 2.1 x 1.1 cm. Cholecystostomy tube present in the
gallbladder via percutaneous right-sided approach.

Pancreas: Normal pancreas. No ductal dilatation or peripancreatic
fluid collection.

Spleen: Normal.

Adrenals/Urinary Tract: The adrenal glands are normal. No
hydronephrosis, nephroureterolithiasis or solid renal mass. 5.0 cm
simple cyst arising from the interpolar right kidney. No follow-up
imaging required. The urinary bladder is normal for degree of
distention

Stomach/Bowel: Small hiatal hernia. No small bowel dilatation or
inflammation. No focal colonic abnormality. Normal appendix.

Vascular/Lymphatic: There is calcific atherosclerosis of the
abdominal aorta. No lymphadenopathy.

Reproductive: Normal uterus. No adnexal mass.

Other: None.

Musculoskeletal: Multilevel degenerative disc disease and facet
arthrosis. No bony spinal canal stenosis.
IMPRESSION: 1. Percutaneous cholecystostomy tube in the gallbladder. Small fluid
collection with inflammatory stranding at the inferior tip of the
liver.

Aortic Atherosclerosis ([HT]-[HT]).

## 2022-01-13 MED ORDER — POTASSIUM CHLORIDE CRYS ER 20 MEQ PO TBCR
40.0000 meq | EXTENDED_RELEASE_TABLET | Freq: Once | ORAL | Status: AC
Start: 2022-01-13 — End: 2022-01-13
  Administered 2022-01-13: 40 meq via ORAL
  Filled 2022-01-13: qty 2

## 2022-01-13 NOTE — ED Triage Notes (Addendum)
Patient has a severe infection with her gallbladder, so she had a drain put in. Waiting to get surgery in 6-8 weeks until infection clears. Was just discharged Tuesday from the hospital and tonight noticed she did not have much drainage from the tubes. She is worried it could be draining inside her body cavity or is worried she misplaced the tube when she changed the dressing tonight. Currently the tube does have yellowish/greenish drainage.  ?

## 2022-01-13 NOTE — ED Notes (Signed)
Patient ambulated to restroom independently.

## 2022-01-13 NOTE — ED Notes (Addendum)
Pt. I-stat Chem 8 results 3.3, RN and EDP, Palumbo made aware. ?

## 2022-01-13 NOTE — ED Provider Notes (Signed)
?[facility] ?Provider Note ? ? ?CSN: [id number] ?Arrival date & time: 01/13/22  0145 ? ?  ? ?History ? ?Chief Complaint  ?Patient presents with  ? Post-op Problem  ? ? ?Katie Weber is a 76 y.o. female. ? ?The history is provided by the patient.  ?Illness ?Location:  Biliary drain in right side ?Quality:  Family reports the drain is not draining ?Severity:  Moderate ?Onset quality:  Gradual ?Duration: hours. ?Timing:  Constant ?Progression:  Unchanged ?Chronicity:  New ?Context:  Biliary drain for gall bladder infection ?Relieved by:  Nothing ?Worsened by:  Nothing ?Ineffective treatments:  None ?Associated symptoms: no congestion, no cough, no fever and no shortness of breath   ?Risk factors:  Elderly with percutaneous drain ? ?  ? ?Home Medications ?Prior to Admission medications   ?Medication Sig Start Date End Date Taking? Authorizing Provider  ?alendronate (FOSAMAX) 70 MG tablet Take 70 mg by mouth once a week. Take with a full glass of water on an empty stomach. Take on Fridays    [provider]  ?Camphor-Menthol-Methyl Sal (SALONPAS) 3.10-14-08 % PTCH Apply 1 application topically daily as needed (Pain).    [provider]  ?cefpodoxime (VANTIN) 200 MG tablet Take 1 tablet (200 mg total) by mouth every 12 (twelve) hours for 11 days. 01/10/22 01/21/22  Antonieta Pert, MD  ?Cholecalciferol (VITAMIN D) 50 MCG (2000 UT) tablet Take 2,000 Units by mouth 3 (three) times a week.    [provider]  ?levothyroxine (SYNTHROID, LEVOTHROID) 100 MCG tablet Take 100 mcg by mouth daily before breakfast.    [provider]  ?metroNIDAZOLE (FLAGYL) 500 MG tablet Take 1 tablet (500 mg total) by mouth 2 (two) times daily for 11 days. 01/10/22 01/21/22  Antonieta Pert, MD  ?ondansetron (ZOFRAN-ODT) 4 MG disintegrating tablet Take 4 mg by mouth every 8 (eight) hours as needed for nausea or vomiting. 01/04/22   [provider]  ?oxyCODONE (OXY IR/ROXICODONE)  5 MG immediate release tablet Take 1 tablet (5 mg total) by mouth every 6 (six) hours as needed for severe pain. 01/10/22   Meuth, Brooke A, PA-C  ?pantoprazole (PROTONIX) 40 MG tablet Take 40 mg by mouth 2 (two) times daily. 01/04/22   [provider]  ?rosuvastatin (CRESTOR) 10 MG tablet Take 10 mg by mouth 2 (two) times a week.    [provider]  ?vitamin B-12 (CYANOCOBALAMIN) 1000 MCG tablet Take 1,000 mcg by mouth 3 (three) times a week.    [provider]  ?vitamin C (ASCORBIC ACID) 500 MG tablet Take 500 mg by mouth 3 (three) times a week.    [provider]  ?zinc gluconate 50 MG tablet Take 50 mg by mouth 3 (three) times a week.    [provider]  ?   ? ?Allergies    ?Contrast media [iodinated contrast media], Ciprofloxacin, Clindamycin/lincomycin, and Penicillins   ? ?Review of Systems   ?Review of Systems  ?Constitutional:  Negative for fever.  ?HENT:  Negative for congestion.   ?Respiratory:  Negative for cough and shortness of breath.   ?Cardiovascular:  Negative for leg swelling.  ?Neurological:  Negative for facial asymmetry.  ?All other systems reviewed and are negative. ? ?Physical Exam ?Updated Vital Signs ?BP (!) 142/87 (BP Location: Right Arm)   Pulse 83   Temp 98.2 ?F (36.8 ?C) (Oral)   Resp 17   SpO2 95%  ?Physical Exam ?Vitals and nursing note reviewed.  ?Constitutional:   ?  General: She is not in acute distress. ?   Appearance: Normal appearance.  ?HENT:  ?   Head: Normocephalic and atraumatic.  ?   Nose: Nose normal.  ?Eyes:  ?   Conjunctiva/sclera: Conjunctivae normal.  ?   Pupils: Pupils are equal, round, and reactive to light.  ?Cardiovascular:  ?   Rate and Rhythm: Normal rate and regular rhythm.  ?   Pulses: Normal pulses.  ?   Heart sounds: Normal heart sounds.  ?Pulmonary:  ?   Effort: Pulmonary effort is normal.  ?   Breath sounds: Normal breath sounds.  ?Abdominal:  ?   General: Bowel sounds are normal.  ?   Palpations: Abdomen is  soft.  ?   Tenderness: There is no abdominal tenderness. There is no guarding or rebound.  ?   Comments: Percutaneous drain with fluid with movement of fluid in the conduit and into the bag on exam, on reevaluation more fluid in the bag in the color of bile   ?Musculoskeletal:     ?   General: Normal range of motion.  ?   Cervical back: Normal range of motion and neck supple.  ?Skin: ?   General: Skin is warm and dry.  ?   Capillary Refill: Capillary refill takes less than 2 seconds.  ?Neurological:  ?   General: No focal deficit present.  ?   Mental Status: She is alert and oriented to person, place, and time.  ?   Deep Tendon Reflexes: Reflexes normal.  ?Psychiatric:     ?   Mood and Affect: Mood normal.     ?   Behavior: Behavior normal.  ? ? ?ED Results / Procedures / Treatments   ?Labs ?(all labs ordered are listed, but only abnormal results are displayed) ?Results for orders placed or performed during the hospital encounter of 01/13/22  ?CBC with Differential/Platelet  ?Result Value Ref Range  ? WBC 11.3 (H) 4.0 - 10.5 K/uL  ? RBC 4.63 3.87 - 5.11 MIL/uL  ? Hemoglobin 12.3 12.0 - 15.0 g/dL  ? HCT 38.7 36.0 - 46.0 %  ? MCV 83.6 80.0 - 100.0 fL  ? MCH 26.6 26.0 - 34.0 pg  ? MCHC 31.8 30.0 - 36.0 g/dL  ? RDW 17.9 (H) 11.5 - 15.5 %  ? Platelets 339 150 - 400 K/uL  ? nRBC 0.0 0.0 - 0.2 %  ? Neutrophils Relative % 67 %  ? Neutro Abs 7.7 1.7 - 7.7 K/uL  ? Lymphocytes Relative 18 %  ? Lymphs Abs 2.0 0.7 - 4.0 K/uL  ? Monocytes Relative 12 %  ? Monocytes Absolute 1.3 (H) 0.1 - 1.0 K/uL  ? Eosinophils Relative 1 %  ? Eosinophils Absolute 0.1 0.0 - 0.5 K/uL  ? Basophils Relative 1 %  ? Basophils Absolute 0.1 0.0 - 0.1 K/uL  ? Immature Granulocytes 1 %  ? Abs Immature Granulocytes 0.09 (H) 0.00 - 0.07 K/uL  ?Comprehensive metabolic panel  ?Result Value Ref Range  ? Sodium 139 135 - 145 mmol/L  ? Potassium 3.3 (L) 3.5 - 5.1 mmol/L  ? Chloride 104 98 - 111 mmol/L  ? CO2 27 22 - 32 mmol/L  ? Glucose, Bld 110 (H) 70 - 99  mg/dL  ? BUN 15 8 - 23 mg/dL  ? Creatinine, Ser 0.77 0.44 - 1.00 mg/dL  ? Calcium 9.0 8.9 - 10.3 mg/dL  ? Total Protein 7.1 6.5 - 8.1 g/dL  ? Albumin 3.2 (L) 3.5 - 5.0 g/dL  ?  AST 22 15 - 41 U/L  ? ALT 40 0 - 44 U/L  ? Alkaline Phosphatase 96 38 - 126 U/L  ? Total Bilirubin 1.0 0.3 - 1.2 mg/dL  ? GFR, Estimated >60 >60 mL/min  ? Anion gap 8 5 - 15  ?Urinalysis, Routine w reflex microscopic Urine, Clean Catch  ?Result Value Ref Range  ? Color, Urine AMBER (A) YELLOW  ? APPearance HAZY (A) CLEAR  ? Specific Gravity, Urine 1.029 1.005 - 1.030  ? pH 5.0 5.0 - 8.0  ? Glucose, UA NEGATIVE NEGATIVE mg/dL  ? Hgb urine dipstick NEGATIVE NEGATIVE  ? Bilirubin Urine NEGATIVE NEGATIVE  ? Ketones, ur 20 (A) NEGATIVE mg/dL  ? Protein, ur NEGATIVE NEGATIVE mg/dL  ? Nitrite NEGATIVE NEGATIVE  ? Leukocytes,Ua MODERATE (A) NEGATIVE  ? RBC / HPF 6-10 0 - 5 RBC/hpf  ? WBC, UA 6-10 0 - 5 WBC/hpf  ? Bacteria, UA NONE SEEN NONE SEEN  ? Squamous Epithelial / LPF 6-10 0 - 5  ? Mucus PRESENT   ? Hyaline Casts, UA PRESENT   ? Ca Oxalate Crys, UA PRESENT   ?I-stat chem 8, ED (not at [facility] or [facility])  ?Result Value Ref Range  ? Sodium 140 135 - 145 mmol/L  ? Potassium 3.3 (L) 3.5 - 5.1 mmol/L  ? Chloride 101 98 - 111 mmol/L  ? BUN 12 8 - 23 mg/dL  ? Creatinine, Ser 0.70 0.44 - 1.00 mg/dL  ? Glucose, Bld 107 (H) 70 - 99 mg/dL  ? Calcium, Ion 1.22 1.15 - 1.40 mmol/L  ? TCO2 26 22 - 32 mmol/L  ? Hemoglobin 13.6 12.0 - 15.0 g/dL  ? HCT 40.0 36.0 - 46.0 %  ? ?CT ABDOMEN PELVIS WO CONTRAST ? ?Result Date: 01/13/2022 ?CLINICAL DATA:  Acute abdominal pain EXAM: CT ABDOMEN AND PELVIS WITHOUT CONTRAST TECHNIQUE: Multidetector CT imaging of the abdomen and pelvis was performed following the standard protocol without IV contrast. RADIATION DOSE REDUCTION: This exam was performed according to the departmental dose-optimization program which includes automated exposure control, adjustment of the mA and/or kV according to patient size and/or use of iterative  reconstruction technique. COMPARISON:  04/25/2021 FINDINGS: Lower Chest: Right basilar atelectasis Hepatobiliary: Normal hepatic contours. No intra- or extrahepatic biliary dilatation. Adjacent to the inferior tip of

## 2022-01-13 NOTE — ED Provider Notes (Signed)
?[facility] FLO[facility]IVE CARE AND UROLOGY ?Provider Note ? ? ?CSN: [id number] ?Arrival date & time: 01/07/22  1741 ? ?  ? ?History ? ?Chief Complaint  ?Patient presents with  ? Abdominal Pain  ? ? ?Katie Weber is a 76 y.o. female. ? ?Presents abdominal pain on and off ongoing for about a year.  She was seen by her GI physician but continues to have abdominal pain.  Vascular the ER for pain worsen so she presents here.  Denies fevers or cough.  Denies vomiting or diarrhea. ? ? ?  ? ?Home Medications ?Prior to Admission medications   ?Medication Sig Start Date End Date Taking? Authorizing Provider  ?alendronate (FOSAMAX) 70 MG tablet Take 70 mg by mouth once a week. Take with a full glass of water on an empty stomach. Take on Fridays   Yes [provider]  ?Camphor-Menthol-Methyl Sal (SALONPAS) 3.10-14-08 % PTCH Apply 1 application topically daily as needed (Pain).   Yes [provider]  ?Cholecalciferol (VITAMIN D) 50 MCG (2000 UT) tablet Take 2,000 Units by mouth 3 (three) times a week.   Yes [provider]  ?levothyroxine (SYNTHROID, LEVOTHROID) 100 MCG tablet Take 100 mcg by mouth daily before breakfast.   Yes [provider]  ?metroNIDAZOLE (FLAGYL) 500 MG tablet Take 1 tablet (500 mg total) by mouth 2 (two) times daily for 11 days. 01/10/22 01/21/22 Yes Antonieta Pert, MD  ?ondansetron (ZOFRAN-ODT) 4 MG disintegrating tablet Take 4 mg by mouth every 8 (eight) hours as needed for nausea or vomiting. 01/04/22  Yes [provider]  ?pantoprazole (PROTONIX) 40 MG tablet Take 40 mg by mouth 2 (two) times daily. 01/04/22  Yes [provider]  ?rosuvastatin (CRESTOR) 10 MG tablet Take 10 mg by mouth 2 (two) times a week.   Yes [provider]  ?vitamin B-12 (CYANOCOBALAMIN) 1000 MCG tablet Take 1,000 mcg by mouth 3 (three) times a week.   Yes [provider]  ?vitamin C (ASCORBIC ACID) 500 MG tablet Take 500 mg by mouth 3 (three) times a  week.   Yes [provider]  ?zinc gluconate 50 MG tablet Take 50 mg by mouth 3 (three) times a week.   Yes [provider]  ?cefpodoxime (VANTIN) 200 MG tablet Take 1 tablet (200 mg total) by mouth every 12 (twelve) hours for 11 days. 01/10/22 01/21/22  Antonieta Pert, MD  ?oxyCODONE (OXY IR/ROXICODONE) 5 MG immediate release tablet Take 1 tablet (5 mg total) by mouth every 6 (six) hours as needed for severe pain. 01/10/22   Meuth, Blaine Hamper, PA-C  ?   ? ?Allergies    ?Contrast media [iodinated contrast media], Ciprofloxacin, Clindamycin/lincomycin, and Penicillins   ? ?Review of Systems   ?Review of Systems  ?Constitutional:  Negative for fever.  ?HENT:  Negative for ear pain.   ?Eyes:  Negative for pain.  ?Respiratory:  Negative for cough.   ?Cardiovascular:  Negative for chest pain.  ?Gastrointestinal:  Positive for abdominal pain.  ?Genitourinary:  Negative for flank pain.  ?Musculoskeletal:  Negative for back pain.  ?Skin:  Negative for rash.  ?Neurological:  Negative for headaches.  ? ?Physical Exam ?Updated Vital Signs ?BP 125/69 (BP Location: Right Arm)   Pulse 81   Temp 97.7 ?F (36.5 ?C) (Oral)   Resp 18   Ht '5\' 2"'$  (1.575 m)   Wt 80.8 kg   SpO2 96%   BMI 32.58 kg/m?  ?Physical Exam ?Constitutional:   ?  General: She is not in acute distress. ?   Appearance: Normal appearance.  ?HENT:  ?   Head: Normocephalic.  ?   Nose: Nose normal.  ?Eyes:  ?   Extraocular Movements: Extraocular movements intact.  ?Cardiovascular:  ?   Rate and Rhythm: Normal rate.  ?Pulmonary:  ?   Effort: Pulmonary effort is normal.  ?Abdominal:  ?   Comments: Epigastric tenderness to palpation.  ?Musculoskeletal:     ?   General: Normal range of motion.  ?   Cervical back: Normal range of motion.  ?Neurological:  ?   General: No focal deficit present.  ?   Mental Status: She is alert. Mental status is at baseline.  ? ? ?ED Results / Procedures / Treatments   ?Labs ?(all labs ordered are listed, but only abnormal results  are displayed) ?Labs Reviewed  ?COMPREHENSIVE METABOLIC PANEL - Abnormal; Notable for the following components:  ?    Result Value  ? Glucose, Bld 116 (*)   ? AST 283 (*)   ? ALT 119 (*)   ? Alkaline Phosphatase 178 (*)   ? Total Bilirubin 1.8 (*)   ? All other components within normal limits  ?CBC - Abnormal; Notable for the following components:  ? MCH 25.5 (*)   ? RDW 17.5 (*)   ? All other components within normal limits  ?URINALYSIS, ROUTINE W REFLEX MICROSCOPIC - Abnormal; Notable for the following components:  ? Ketones, ur TRACE (*)   ? Protein, ur TRACE (*)   ? Leukocytes,Ua LARGE (*)   ? Bacteria, UA RARE (*)   ? Non Squamous Epithelial 0-5 (*)   ? All other components within normal limits  ?CBC - Abnormal; Notable for the following components:  ? WBC 14.3 (*)   ? RDW 17.9 (*)   ? All other components within normal limits  ?COMPREHENSIVE METABOLIC PANEL - Abnormal; Notable for the following components:  ? Glucose, Bld 143 (*)   ? AST 440 (*)   ? ALT 233 (*)   ? Alkaline Phosphatase 189 (*)   ? Total Bilirubin 2.9 (*)   ? All other components within normal limits  ?CBC WITH DIFFERENTIAL/PLATELET - Abnormal; Notable for the following components:  ? WBC 15.9 (*)   ? Hemoglobin 11.7 (*)   ? RDW 18.0 (*)   ? Neutro Abs 14.5 (*)   ? Abs Immature Granulocytes 0.08 (*)   ? All other components within normal limits  ?COMPREHENSIVE METABOLIC PANEL - Abnormal; Notable for the following components:  ? Glucose, Bld 132 (*)   ? Calcium 8.6 (*)   ? Albumin 3.0 (*)   ? AST 114 (*)   ? ALT 143 (*)   ? Alkaline Phosphatase 140 (*)   ? Total Bilirubin 6.1 (*)   ? All other components within normal limits  ?PROTIME-INR - Abnormal; Notable for the following components:  ? Prothrombin Time 15.9 (*)   ? INR 1.3 (*)   ? All other components within normal limits  ?HEPARIN LEVEL (UNFRACTIONATED) - Abnormal; Notable for the following components:  ? Heparin Unfractionated 0.14 (*)   ? All other components within normal limits  ?CBC -  Abnormal; Notable for the following components:  ? WBC 12.5 (*)   ? Hemoglobin 10.6 (*)   ? HCT 34.4 (*)   ? RDW 17.9 (*)   ? All other components within normal limits  ?COMPREHENSIVE METABOLIC PANEL - Abnormal; Notable for the following components:  ? Calcium  8.6 (*)   ? Total Protein 6.0 (*)   ? Albumin 2.9 (*)   ? AST 50 (*)   ? ALT 92 (*)   ? Total Bilirubin 2.4 (*)   ? Anion gap 4 (*)   ? All other components within normal limits  ?URINE CULTURE  ?AEROBIC/ANAEROBIC CULTURE W GRAM STAIN (SURGICAL/DEEP WOUND)  ?LIPASE, BLOOD  ?TSH  ?LIPASE, BLOOD  ?MAGNESIUM  ?LACTIC ACID, PLASMA  ?LACTIC ACID, PLASMA  ?APTT  ?HEPARIN LEVEL (UNFRACTIONATED)  ?TROPONIN I (HIGH SENSITIVITY)  ?TROPONIN I (HIGH SENSITIVITY)  ? ? ?EKG ?EKG Interpretation ? ?Date/Time:  Saturday January 07 2022 19:20:04 EDT ?Ventricular Rate:  67 ?PR Interval:    ?QRS Duration: 100 ?QT Interval:  421 ?QTC Calculation: 445 ?R Axis:   45 ?Text Interpretation: Atrial fibrillation Low voltage, precordial leads Confirmed by Thamas Jaegers ([id number]) on 01/07/2022 7:33:14 PM ? ?Radiology ?CT ABDOMEN PELVIS WO CONTRAST ? ?Result Date: 01/13/2022 ?CLINICAL DATA:  Acute abdominal pain EXAM: CT ABDOMEN AND PELVIS WITHOUT CONTRAST TECHNIQUE: Multidetector CT imaging of the abdomen and pelvis was performed following the standard protocol without IV contrast. RADIATION DOSE REDUCTION: This exam was performed according to the departmental dose-optimization program which includes automated exposure control, adjustment of the mA and/or kV according to patient size and/or use of iterative reconstruction technique. COMPARISON:  04/25/2021 FINDINGS: Lower Chest: Right basilar atelectasis Hepatobiliary: Normal hepatic contours. No intra- or extrahepatic biliary dilatation. Adjacent to the inferior tip of the liver there is inflammatory stranding and a small fluid collection that measures approximately 2.1 x 1.1 cm. Cholecystostomy tube present in the gallbladder via percutaneous  right-sided approach. Pancreas: Normal pancreas. No ductal dilatation or peripancreatic fluid collection. Spleen: Normal. Adrenals/Urinary Tract: The adrenal glands are normal. No hydronephrosis, nephroureterol

## 2022-01-14 LAB — AEROBIC/ANAEROBIC CULTURE W GRAM STAIN (SURGICAL/DEEP WOUND)

## 2022-01-27 ENCOUNTER — Other Ambulatory Visit (HOSPITAL_COMMUNITY): Payer: Self-pay

## 2022-01-27 MED ORDER — SODIUM CHLORIDE FLUSH 0.9 % IV SOLN
INTRAVENOUS | 3 refills | Status: DC
  Filled 2022-01-27: qty 300, 30d supply, fill #0

## 2022-02-07 ENCOUNTER — Ambulatory Visit: Payer: Self-pay | Admitting: General Surgery

## 2022-02-07 DIAGNOSIS — K810 Acute cholecystitis: Secondary | ICD-10-CM

## 2022-02-09 ENCOUNTER — Other Ambulatory Visit: Payer: Self-pay | Admitting: General Surgery

## 2022-02-09 DIAGNOSIS — K810 Acute cholecystitis: Secondary | ICD-10-CM

## 2022-02-09 DIAGNOSIS — K8050 Calculus of bile duct without cholangitis or cholecystitis without obstruction: Secondary | ICD-10-CM

## 2022-02-10 ENCOUNTER — Ambulatory Visit
Admission: RE | Admit: 2022-02-10 | Discharge: 2022-02-10 | Disposition: A | Discharge status: Home / Self Care | Payer: Medicare Other | Source: Ambulatory Visit | Attending: General Surgery | Admitting: General Surgery

## 2022-02-10 DIAGNOSIS — K8050 Calculus of bile duct without cholangitis or cholecystitis without obstruction: Secondary | ICD-10-CM

## 2022-02-10 DIAGNOSIS — K810 Acute cholecystitis: Secondary | ICD-10-CM

## 2022-02-10 HISTORY — PX: IR RADIOLOGIST EVAL & MGMT: IMG5224

## 2022-02-10 NOTE — Progress Notes (Signed)
? ?Referring Physician(s): ?Toth,Paul III ? ?Reason for follow up: ?Cholecystostomy tube check ? ? ?History of present illness: ?76 year old female with history of acute calculous cholecystitis status post image guided percutaneous cholecystostomy tube placement on 01/08/22.  She presents today for cholangiogram and tube site check. ? ?She endorses some redness and pain at the skin entry site.  She has been managing her gauze bandage, but has not taken down the stat-lock dressing.  No reports of drainage/leakage from skin entry site.  Unchanged bilious drainage in bag without any significant change in output.  No fevers, chills, shortness of breath, abdominal pain, nausea, vomiting. ? ? ?Past Medical History:  ?Diagnosis Date  ? Anemia   ? when pt was younger  ? Arthritis   ? arthritis fingers  ? Asthma   ? Coronary artery calcification 04/24/2017  ? Diverticulitis   ? x2 -last 1'17 tx. outpatient.  ? Environmental and seasonal allergies   ? 2014 Possibly related to air freshners.  ? 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  ? Family history of anesthesia complication 95JOA ago  ? daughter had an allergic reaction to atropine  ? GERD (gastroesophageal reflux disease)   ? no meds  ? History of hiatal hernia 09/14/2014  ? seen on xray  ? History of kidney stones   ? '80's lithotripsy-[facility] x1  ? History of kidney stones   ? x1  ? Hyperlipidemia   ? Hypothyroidism   ? Pneumonia 2014  ? Pure hypercholesterolemia   ? Tremor of left hand   ? ? ?Past Surgical History:  ?Procedure Laterality Date  ? BALLOON DILATION N/A 05/04/2014  ? Procedure: BALLOON DILATION;  Surgeon: Garlan Fair, MD;  Location: [facility];  Service: Endoscopy;  Laterality: N/A;  ? BREAST LUMPECTOMY Right 1972  ? benign   ? BREAST SURGERY Right   ? '72 -benign tumor  ? childbirth- NVD x2     ? COLONOSCOPY WITH PROPOFOL N/A 05/04/2014  ? Procedure: COLONOSCOPY WITH PROPOFOL;  Surgeon: Garlan Fair, MD;  Location: [facility];  Service: Endoscopy;  Laterality: N/A;  ? DILATION AND CURETTAGE OF UTERUS    ? 04-20-14 [facility] hospital endometrial polyp removed.  ? ESOPHAGOGASTRODUODENOSCOPY (EGD) WITH PROPOFOL N/A 05/04/2014  ? Procedure: ESOPHAGOGASTRODUODENOSCOPY (EGD) WITH PROPOFOL;  Surgeon: Garlan Fair, MD;  Location: [facility];  Service: Endoscopy;  Laterality: N/A;. [place].  ? HYSTEROSCOPY WITH D & C N/A 04/20/2014  ? Procedure: DILATATION AND CURETTAGE /HYSTEROSCOPY;  Surgeon: [name], MD;  Location: [facility];  Service: Gynecology;  Laterality: N/A;  ? IR PERC CHOLECYSTOSTOMY  01/08/2022  ? LITHOTRIPSY    ? POLYPECTOMY  2020  ? Dr. Lear Ng  ? 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 Procedure: LEFT TOTAL KNEE ARTHROPLASTY;  Surgeon: Mcarthur Rossetti, MD;  Location: [facility];  Service: Orthopedics;  Laterality: Left;  ? TOTAL KNEE ARTHROPLASTY Right 08/09/2021  ? Procedure: RIGHT TOTAL KNEE ARTHROPLASTY;  Surgeon: Mcarthur Rossetti, MD;  Location: [facility];  Service: Orthopedics;  Laterality: Right;  Needs RNFA  ? TUBAL LIGATION    ? ? ?Allergies: ?Contrast media [iodinated contrast media], Ciprofloxacin, Clindamycin/lincomycin, and Penicillins ? ?Medications: ?Prior to Admission medications   ?Medication Sig Start Date End Date Taking? Authorizing Provider  ?alendronate (FOSAMAX) 70 MG tablet Take 70 mg by mouth once a week. Take  with a full glass of water on an empty stomach. Take on Fridays    [provider]  ?Camphor-Menthol-Methyl Sal (SALONPAS) 3.10-14-08 % PTCH Apply 1 application topically daily as needed (Pain).    [provider]  ?Cholecalciferol (VITAMIN D) 50 MCG (2000 UT) tablet Take 2,000 Units by mouth 3 (three) times a week.    [provider]  ?levothyroxine (SYNTHROID, LEVOTHROID) 100 MCG tablet Take 100 mcg by mouth daily before breakfast.    [provider]  ?ondansetron  (ZOFRAN-ODT) 4 MG disintegrating tablet Take 4 mg by mouth every 8 (eight) hours as needed for nausea or vomiting. 01/04/22   [provider]  ?oxyCODONE (OXY IR/ROXICODONE) 5 MG immediate release tablet Take 1 tablet (5 mg total) by mouth every 6 (six) hours as needed for severe pain. 01/10/22   Meuth, Brooke A, PA-C  ?pantoprazole (PROTONIX) 40 MG tablet Take 40 mg by mouth 2 (two) times daily. 01/04/22   [provider]  ?rosuvastatin (CRESTOR) 10 MG tablet Take 10 mg by mouth 2 (two) times a week.    [provider]  ?sodium chloride flush 0.9 % SOLN injection Flush gallbladder drain once daily with 5 ml syringe. 01/10/22   Allred, Shirlyn Goltz, PA-C  ?vitamin B-12 (CYANOCOBALAMIN) 1000 MCG tablet Take 1,000 mcg by mouth 3 (three) times a week.    [provider]  ?vitamin C (ASCORBIC ACID) 500 MG tablet Take 500 mg by mouth 3 (three) times a week.    [provider]  ?zinc gluconate 50 MG tablet Take 50 mg by mouth 3 (three) times a week.    [provider]  ?  ? ?Family History  ?Problem Relation Age of Onset  ? Cancer Other   ? Endometrial cancer Mother   ? CAD Father   ? Breast cancer Maternal Aunt   ? ? ?Social History  ? ?Socioeconomic History  ? Marital status: Married  ?  Spouse name: Not on file  ? Number of children: Not on file  ? Years of education: Not on file  ? Highest education level: Not on file  ?Occupational History  ? Not on file  ?Tobacco Use  ? Smoking status: Never  ? Smokeless tobacco: Never  ?Vaping Use  ? Vaping Use: Never used  ?Substance and Sexual Activity  ? Alcohol use: Yes  ?  Comment: rare social  ? Drug use: No  ? Sexual activity: Not Currently  ?Other Topics Concern  ? Not on file  ?Social History Narrative  ? Not on file  ? ?Social Determinants of Health  ? ?Financial Resource Strain: Not on file  ?Food Insecurity: Not on file  ?Transportation Needs: Not on file  ?Physical Activity: Not on file  ?Stress: Not on file  ?Social  Connections: Not on file  ? ? ? ?Vital Signs: ?There were no vitals taken for this visit. ? ?Physical Exam ?HENT:  ?   Head: Normocephalic.  ?   Mouth/Throat:  ?   Mouth: Mucous membranes are moist.  ?Eyes:  ?   General: No scleral icterus. ?Pulmonary:  ?   Effort: Pulmonary effort is normal.  ?Abdominal:  ?   General: There is no distension.  ?   Comments: RUQ cholecystostomy drain in place.  Bilious output in bag.  Suture intact.  Minimal erythema at suture site.  Drain site without erythema or leaking.  ?Skin: ?   General: Skin is warm and dry.  ?   Coloration:  Skin is not jaundiced.  ?Neurological:  ?   Mental Status: She is alert and oriented to person, place, and time.  ? ? ?Imaging: ?No new pertinent imaging. ? ?Labs: ? ?CBC: ?Recent Labs  ?  01/08/22 ?0159 01/09/22 ?0609 01/10/22 ?0425 01/13/22 ?0230 01/13/22 ?0238  ?WBC 14.3* 15.9* 12.5* 11.3*  --   ?HGB 12.7 11.7* 10.6* 12.3 13.6  ?HCT 40.8 37.3 34.4* 38.7 40.0  ?PLT 298 251 233 339  --   ? ? ?COAGS: ?Recent Labs  ?  01/08/22 ?1913  ?INR 1.3*  ?APTT 30  ? ? ?BMP: ?Recent Labs  ?  01/08/22 ?0159 01/09/22 ?0609 01/10/22 ?0425 01/13/22 ?0230 01/13/22 ?8101  ?NA 138 137 139 139 140  ?K 3.9 4.2 4.0 3.3* 3.3*  ?CL 105 105 107 104 101  ?CO2 '26 26 28 27  '$ --   ?GLUCOSE 143* 132* 85 110* 107*  ?BUN '8 14 16 15 12  '$ ?CALCIUM 9.1 8.6* 8.6* 9.0  --   ?CREATININE 0.72 0.61 0.76 0.77 0.70  ?GFRNONAA >60 >60 >60 >60  --   ? ? ?LIVER FUNCTION TESTS: ?Recent Labs  ?  01/08/22 ?0159 01/09/22 ?0609 01/10/22 ?0425 01/13/22 ?0230  ?BILITOT 2.9* 6.1* 2.4* 1.0  ?AST 440* 114* 50* 22  ?ALT 233* 143* 92* 40  ?ALKPHOS 189* 140* 113 96  ?PROT 7.3 6.6 6.0* 7.1  ?ALBUMIN 3.8 3.0* 2.9* 3.2*  ? ? ?Assessment and Plan: ?76 year old female with history of acute calculous cholecystitis status post percutaneous cholecystostomy tube placement on 01/08/22.  The tube is functioning well without skin entry site complication. She was scheduled for cholecystogram/cholangiogram today, however was not  pre-medicated for her iodinated contrast allergy.  She has laparoscopic cholecystectomy scheduled with Dr. Marlou Starks on 03/10/22.  ? ?-will reschedule cholecystogram with pre-medication ?-sent note to Dr. Marlou Starks to determin

## 2022-03-01 NOTE — Pre-Procedure Instructions (Signed)
Surgical Instructions    Your procedure is scheduled on Friday, June 2nd.  Report to [facility] Main Entrance "A" at 1:00 P.M., then check in with the Admitting office.  Call this number if you have problems the morning of surgery:  [phone]   If you have any questions prior to your surgery date call [phone]: Open Monday-Friday 8am-4pm    Remember:  Do not eat after midnight the night before your surgery  You may drink clear liquids until 12:00 PM the day of your surgery.   Clear liquids allowed are: Water, Non-Citrus Juices (without pulp), Carbonated Beverages, Clear Tea, Black Coffee Only (NO MILK, CREAM OR POWDERED CREAMER of any kind), and Gatorade.    Take these medicines the morning of surgery with A SIP OF WATER  alendronate (FOSAMAX) levothyroxine (SYNTHROID, LEVOTHROID) pantoprazole (PROTONIX) rosuvastatin (CRESTOR)   As of today, STOP taking any Aspirin (unless otherwise instructed by your surgeon) Aleve, Naproxen, Ibuprofen, Motrin, Advil, Goody's, BC's, all herbal medications, fish oil, and all vitamins.                     Do NOT Smoke (Tobacco/Vaping) for 24 hours prior to your procedure.  If you use a CPAP at night, you may bring your mask/headgear for your overnight stay.   Contacts, glasses, piercing's, hearing aid's, dentures or partials may not be worn into surgery, please bring cases for these belongings.    For patients admitted to the hospital, discharge time will be determined by your treatment team.   Patients discharged the day of surgery will not be allowed to drive home, and someone needs to stay with them for 24 hours.  SURGICAL WAITING ROOM VISITATION Patients having surgery or a procedure may have two support people in the waiting room. These visitors may be switched out with other visitors if needed. Children under the age of 30 must have an adult accompany them who is not the patient. If the patient needs to stay at the hospital during  part of their recovery, the visitor guidelines for inpatient rooms apply.  Please refer to the [facility] website for the visitor guidelines for Inpatients (after your surgery is over and you are in a regular room).    Special instructions:   [facility]- Preparing For Surgery  Before surgery, you can play an important role. Because skin is not sterile, your skin needs to be as free of germs as possible. You can reduce the number of germs on your skin by washing with CHG (chlorahexidine gluconate) Soap before surgery.  CHG is an antiseptic cleaner which kills germs and bonds with the skin to continue killing germs even after washing.    Oral Hygiene is also important to reduce your risk of infection.  Remember - BRUSH YOUR TEETH THE MORNING OF SURGERY WITH YOUR REGULAR TOOTHPASTE  Please do not use if you have an allergy to CHG or antibacterial soaps. If your skin becomes reddened/irritated stop using the CHG.  Do not shave (including legs and underarms) for at least 48 hours prior to first CHG shower. It is OK to shave your face.  Please follow these instructions carefully.   Shower the NIGHT BEFORE SURGERY and the MORNING OF SURGERY  If you chose to wash your hair, wash your hair first as usual with your normal shampoo.  After you shampoo, rinse your hair and body thoroughly to remove the shampoo.  Use CHG Soap as you would any other liquid soap. You can  apply CHG directly to the skin and wash gently with a scrungie or a clean washcloth.   Apply the CHG Soap to your body ONLY FROM THE NECK DOWN.  Do not use on open wounds or open sores. Avoid contact with your eyes, ears, mouth and genitals (private parts). Wash Face and genitals (private parts)  with your normal soap.   Wash thoroughly, paying special attention to the area where your surgery will be performed.  Thoroughly rinse your body with warm water from the neck down.  DO NOT shower/wash with your normal soap after using and  rinsing off the CHG Soap.  Pat yourself dry with a CLEAN TOWEL.  Wear CLEAN PAJAMAS to bed the night before surgery  Place CLEAN SHEETS on your bed the night before your surgery  DO NOT SLEEP WITH PETS.   Day of Surgery: Take a shower with CHG soap. Do not wear jewelry or makeup Do not wear lotions, powders, perfumes, or deodorant. Do not shave 48 hours prior to surgery.  Do not bring valuables to the hospital.  [facility] is not responsible for any belongings or valuables. Do not wear nail polish, gel polish, artificial nails, or any other type of covering on natural nails (fingers and toes) If you have artificial nails or gel coating that need to be removed by a nail salon, please have this removed prior to surgery. Artificial nails or gel coating may interfere with anesthesia's ability to adequately monitor your vital signs. Wear Clean/Comfortable clothing the morning of surgery Remember to brush your teeth WITH YOUR REGULAR TOOTHPASTE.   Please read over the following fact sheets that you were given.    If you received a COVID test during your pre-op visit  it is requested that you wear a mask when out in public, stay away from anyone that may not be feeling well and notify your surgeon if you develop symptoms. If you have been in contact with anyone that has tested positive in the last 10 days please notify you surgeon.

## 2022-03-02 ENCOUNTER — Other Ambulatory Visit: Payer: Self-pay

## 2022-03-02 ENCOUNTER — Encounter (HOSPITAL_COMMUNITY): Payer: Self-pay

## 2022-03-02 ENCOUNTER — Encounter (HOSPITAL_COMMUNITY)
Admission: RE | Admit: 2022-03-02 | Discharge: 2022-03-02 | Disposition: A | Discharge status: Home / Self Care | Payer: Medicare Other | Source: Ambulatory Visit | Attending: General Surgery | Admitting: General Surgery

## 2022-03-02 VITALS — BP 149/87 | HR 72 | Temp 98.0°F | Ht 62.0 in | Wt 170.9 lb

## 2022-03-02 DIAGNOSIS — Z01812 Encounter for preprocedural laboratory examination: Secondary | ICD-10-CM | POA: Diagnosis present

## 2022-03-02 DIAGNOSIS — K810 Acute cholecystitis: Secondary | ICD-10-CM | POA: Diagnosis not present

## 2022-03-02 DIAGNOSIS — Z01818 Encounter for other preprocedural examination: Secondary | ICD-10-CM

## 2022-03-02 LAB — CBC
HCT: 41.9 % (ref 36.0–46.0)
Hemoglobin: 13.2 g/dL (ref 12.0–15.0)
MCH: 27.4 pg (ref 26.0–34.0)
MCHC: 31.5 g/dL (ref 30.0–36.0)
MCV: 87.1 fL (ref 80.0–100.0)
Platelets: 291 10*3/uL (ref 150–400)
RBC: 4.81 MIL/uL (ref 3.87–5.11)
RDW: 15.9 % — ABNORMAL HIGH (ref 11.5–15.5)
WBC: 7.6 10*3/uL (ref 4.0–10.5)
nRBC: 0 % (ref 0.0–0.2)

## 2022-03-02 LAB — COMPREHENSIVE METABOLIC PANEL
ALT: 16 U/L (ref 0–44)
AST: 26 U/L (ref 15–41)
Albumin: 3.9 g/dL (ref 3.5–5.0)
Alkaline Phosphatase: 53 U/L (ref 38–126)
Anion gap: 7 (ref 5–15)
BUN: 9 mg/dL (ref 8–23)
CO2: 26 mmol/L (ref 22–32)
Calcium: 9.9 mg/dL (ref 8.9–10.3)
Chloride: 108 mmol/L (ref 98–111)
Creatinine, Ser: 0.75 mg/dL (ref 0.44–1.00)
GFR, Estimated: >60 mL/min (ref >60)
Glucose, Bld: 92 mg/dL (ref 70–99)
Potassium: 4.2 mmol/L (ref 3.5–5.1)
Sodium: 141 mmol/L (ref 135–145)
Total Bilirubin: 0.6 mg/dL (ref 0.3–1.2)
Total Protein: 7.5 g/dL (ref 6.5–8.1)

## 2022-03-02 NOTE — Progress Notes (Signed)
PCP - Dr. Leeroy Cha Cardiologist - denies  PPM/ICD - denies   Chest x-ray - 01/08/22 EKG - 01/08/22 Stress Test - 07/24/17 ECHO - 01/09/22 Cardiac Cath - denies  Sleep Study - denies   DM- denies  ASA/Blood Thinner Instructions: n/a   ERAS Protcol - yes, no drink   COVID TEST- n/a   Anesthesia review: no  Patient denies shortness of breath, fever, cough and chest pain at PAT appointment   All instructions explained to the patient, with a verbal understanding of the material. Patient agrees to go over the instructions while at home for a better understanding. Patient also instructed to notify surgeon of any contact with COVID+ person or if she develops any symptoms. The opportunity to ask questions was provided.

## 2022-03-10 ENCOUNTER — Ambulatory Visit (HOSPITAL_COMMUNITY)
Admission: RE | Admit: 2022-03-10 | Discharge: 2022-03-10 | Disposition: A | Discharge status: Home / Self Care | Payer: Medicare Other | Source: Home / Self Care | Attending: General Surgery | Admitting: General Surgery

## 2022-03-10 ENCOUNTER — Ambulatory Visit (HOSPITAL_COMMUNITY): Payer: Medicare Other | Admitting: Physician Assistant

## 2022-03-10 ENCOUNTER — Encounter (HOSPITAL_COMMUNITY): Payer: Self-pay | Admitting: General Surgery

## 2022-03-10 ENCOUNTER — Encounter (HOSPITAL_COMMUNITY)
Admission: RE | Disposition: A | Discharge status: Home / Self Care | Payer: Self-pay | Source: Home / Self Care | Attending: General Surgery

## 2022-03-10 ENCOUNTER — Ambulatory Visit (HOSPITAL_COMMUNITY): Payer: Medicare Other

## 2022-03-10 ENCOUNTER — Ambulatory Visit (HOSPITAL_BASED_OUTPATIENT_CLINIC_OR_DEPARTMENT_OTHER): Payer: Medicare Other | Admitting: Anesthesiology

## 2022-03-10 ENCOUNTER — Other Ambulatory Visit: Payer: Self-pay

## 2022-03-10 DIAGNOSIS — K819 Cholecystitis, unspecified: Secondary | ICD-10-CM

## 2022-03-10 DIAGNOSIS — M1990 Unspecified osteoarthritis, unspecified site: Secondary | ICD-10-CM | POA: Insufficient documentation

## 2022-03-10 DIAGNOSIS — K219 Gastro-esophageal reflux disease without esophagitis: Secondary | ICD-10-CM | POA: Insufficient documentation

## 2022-03-10 DIAGNOSIS — I10 Essential (primary) hypertension: Secondary | ICD-10-CM

## 2022-03-10 DIAGNOSIS — K429 Umbilical hernia without obstruction or gangrene: Secondary | ICD-10-CM | POA: Insufficient documentation

## 2022-03-10 DIAGNOSIS — E039 Hypothyroidism, unspecified: Secondary | ICD-10-CM | POA: Insufficient documentation

## 2022-03-10 DIAGNOSIS — J45909 Unspecified asthma, uncomplicated: Secondary | ICD-10-CM | POA: Insufficient documentation

## 2022-03-10 DIAGNOSIS — Z79890 Hormone replacement therapy (postmenopausal): Secondary | ICD-10-CM | POA: Insufficient documentation

## 2022-03-10 DIAGNOSIS — Z79899 Other long term (current) drug therapy: Secondary | ICD-10-CM | POA: Insufficient documentation

## 2022-03-10 DIAGNOSIS — I2510 Atherosclerotic heart disease of native coronary artery without angina pectoris: Secondary | ICD-10-CM

## 2022-03-10 DIAGNOSIS — K9189 Other postprocedural complications and disorders of digestive system: Secondary | ICD-10-CM | POA: Diagnosis not present

## 2022-03-10 DIAGNOSIS — K8012 Calculus of gallbladder with acute and chronic cholecystitis without obstruction: Secondary | ICD-10-CM | POA: Insufficient documentation

## 2022-03-10 HISTORY — PX: CHOLECYSTECTOMY: SHX55

## 2022-03-10 SURGERY — LAPAROSCOPIC CHOLECYSTECTOMY WITH INTRAOPERATIVE CHOLANGIOGRAM
Anesthesia: General | Site: Abdomen

## 2022-03-10 MED ORDER — HYDROMORPHONE HCL 1 MG/ML IJ SOLN
INTRAMUSCULAR | Status: AC
  Filled 2022-03-10: qty 1

## 2022-03-10 MED ORDER — HYDROMORPHONE HCL 1 MG/ML IJ SOLN
0.2500 mg | INTRAMUSCULAR | Status: DC | PRN
  Administered 2022-03-10 (×2): 0.25 mg via INTRAVENOUS

## 2022-03-10 MED ORDER — ROCURONIUM BROMIDE 10 MG/ML (PF) SYRINGE
PREFILLED_SYRINGE | INTRAVENOUS | Status: DC | PRN
  Administered 2022-03-10: 70 mg via INTRAVENOUS

## 2022-03-10 MED ORDER — LACTATED RINGERS IV SOLN: INTRAVENOUS | Status: DC

## 2022-03-10 MED ORDER — SODIUM CHLORIDE 0.9 % IR SOLN
Status: DC | PRN
  Administered 2022-03-10: 1000 mL

## 2022-03-10 MED ORDER — ORAL CARE MOUTH RINSE: 15.0000 mL | Freq: Once | OROMUCOSAL | Status: AC

## 2022-03-10 MED ORDER — CHLORHEXIDINE GLUCONATE CLOTH 2 % EX PADS: 6.0000 | MEDICATED_PAD | Freq: Once | CUTANEOUS | Status: DC

## 2022-03-10 MED ORDER — ACETAMINOPHEN 500 MG PO TABS
1000.0000 mg | ORAL_TABLET | ORAL | Status: AC
  Administered 2022-03-10: 1000 mg via ORAL
  Filled 2022-03-10: qty 2

## 2022-03-10 MED ORDER — BUPIVACAINE-EPINEPHRINE 0.25% -1:200000 IJ SOLN
INTRAMUSCULAR | Status: DC | PRN
  Administered 2022-03-10: 26 mL

## 2022-03-10 MED ORDER — ONDANSETRON HCL 4 MG/2ML IJ SOLN
INTRAMUSCULAR | Status: AC
Start: 2022-03-10 — End: ?
  Filled 2022-03-10: qty 2

## 2022-03-10 MED ORDER — AMISULPRIDE (ANTIEMETIC) 5 MG/2ML IV SOLN: 10.0000 mg | Freq: Once | INTRAVENOUS | Status: DC | PRN

## 2022-03-10 MED ORDER — DEXAMETHASONE SODIUM PHOSPHATE 10 MG/ML IJ SOLN
INTRAMUSCULAR | Status: AC
  Filled 2022-03-10: qty 1

## 2022-03-10 MED ORDER — BUPIVACAINE-EPINEPHRINE (PF) 0.25% -1:200000 IJ SOLN
INTRAMUSCULAR | Status: AC
  Filled 2022-03-10: qty 30

## 2022-03-10 MED ORDER — PHENYLEPHRINE 80 MCG/ML (10ML) SYRINGE FOR IV PUSH (FOR BLOOD PRESSURE SUPPORT)
PREFILLED_SYRINGE | INTRAVENOUS | Status: DC | PRN
  Administered 2022-03-10: 80 ug via INTRAVENOUS

## 2022-03-10 MED ORDER — CELECOXIB 100 MG PO CAPS
100.0000 mg | ORAL_CAPSULE | ORAL | Status: DC
  Filled 2022-03-10: qty 1

## 2022-03-10 MED ORDER — SUGAMMADEX SODIUM 200 MG/2ML IV SOLN
INTRAVENOUS | Status: DC | PRN
  Administered 2022-03-10: 200 mg via INTRAVENOUS

## 2022-03-10 MED ORDER — FENTANYL CITRATE (PF) 250 MCG/5ML IJ SOLN
INTRAMUSCULAR | Status: DC | PRN
  Administered 2022-03-10: 100 ug via INTRAVENOUS

## 2022-03-10 MED ORDER — ONDANSETRON HCL 4 MG/2ML IJ SOLN
INTRAMUSCULAR | Status: DC | PRN
  Administered 2022-03-10: 4 mg via INTRAVENOUS

## 2022-03-10 MED ORDER — PHENYLEPHRINE 80 MCG/ML (10ML) SYRINGE FOR IV PUSH (FOR BLOOD PRESSURE SUPPORT)
PREFILLED_SYRINGE | INTRAVENOUS | Status: AC
  Filled 2022-03-10: qty 10

## 2022-03-10 MED ORDER — LIDOCAINE 2% (20 MG/ML) 5 ML SYRINGE
INTRAMUSCULAR | Status: DC | PRN
  Administered 2022-03-10: 60 mg via INTRAVENOUS

## 2022-03-10 MED ORDER — DEXAMETHASONE SODIUM PHOSPHATE 10 MG/ML IJ SOLN
INTRAMUSCULAR | Status: DC | PRN
  Administered 2022-03-10: 10 mg via INTRAVENOUS

## 2022-03-10 MED ORDER — FENTANYL CITRATE (PF) 250 MCG/5ML IJ SOLN
INTRAMUSCULAR | Status: AC
  Filled 2022-03-10: qty 5

## 2022-03-10 MED ORDER — OXYCODONE HCL 5 MG PO TABS: 5.0000 mg | ORAL_TABLET | Freq: Four times a day (QID) | ORAL | 0 refills | Status: AC | PRN

## 2022-03-10 MED ORDER — CHLORHEXIDINE GLUCONATE 0.12 % MT SOLN
15.0000 mL | Freq: Once | OROMUCOSAL | Status: AC
  Administered 2022-03-10: 15 mL via OROMUCOSAL
  Filled 2022-03-10: qty 15

## 2022-03-10 MED ORDER — SODIUM CHLORIDE 0.9 % IV SOLN
1.0000 g | INTRAVENOUS | Status: AC
  Administered 2022-03-10: 1 g via INTRAVENOUS
  Filled 2022-03-10: qty 1

## 2022-03-10 MED ORDER — GABAPENTIN 100 MG PO CAPS
100.0000 mg | ORAL_CAPSULE | ORAL | Status: AC
  Administered 2022-03-10: 100 mg via ORAL
  Filled 2022-03-10: qty 1

## 2022-03-10 MED ORDER — PROPOFOL 10 MG/ML IV BOLUS
INTRAVENOUS | Status: DC | PRN
  Administered 2022-03-10: 120 mg via INTRAVENOUS

## 2022-03-10 MED ORDER — CELECOXIB 200 MG PO CAPS
200.0000 mg | ORAL_CAPSULE | ORAL | Status: AC
  Administered 2022-03-10: 200 mg via ORAL
  Filled 2022-03-10: qty 1

## 2022-03-10 MED ORDER — OXYCODONE HCL 5 MG PO TABS
ORAL_TABLET | ORAL | Status: AC
  Administered 2022-03-10: 5 mg
  Filled 2022-03-10: qty 1

## 2022-03-10 MED ORDER — 0.9 % SODIUM CHLORIDE (POUR BTL) OPTIME
TOPICAL | Status: DC | PRN
  Administered 2022-03-10: 1000 mL

## 2022-03-10 SURGICAL SUPPLY — 45 items
ADH SKN CLS APL DERMABOND .7 (GAUZE/BANDAGES/DRESSINGS) ×1
APL PRP STRL LF DISP 70% ISPRP (MISCELLANEOUS) ×1
APPLIER CLIP 5 13 M/L LIGAMAX5 (MISCELLANEOUS) ×2
APR CLP MED LRG 5 ANG JAW (MISCELLANEOUS) ×1
BAG COUNTER SPONGE SURGICOUNT (BAG) ×2 IMPLANT
BAG SPEC RTRVL LRG 6X4 10 (ENDOMECHANICALS) ×1
BAG SPNG CNTER NS LX DISP (BAG) ×1
CANISTER SUCT 3000ML PPV (MISCELLANEOUS) ×2 IMPLANT
CATH REDDICK CHOLANGI 4FR 50CM (CATHETERS) ×1 IMPLANT
CHLORAPREP W/TINT 26 (MISCELLANEOUS) ×2 IMPLANT
CLIP APPLIE 5 13 M/L LIGAMAX5 (MISCELLANEOUS) ×1 IMPLANT
COVER MAYO STAND STRL (DRAPES) ×1 IMPLANT
COVER SURGICAL LIGHT HANDLE (MISCELLANEOUS) ×2 IMPLANT
DERMABOND ADVANCED (GAUZE/BANDAGES/DRESSINGS) ×1
DERMABOND ADVANCED .7 DNX12 (GAUZE/BANDAGES/DRESSINGS) ×1 IMPLANT
DRAPE C-ARM 42X120 X-RAY (DRAPES) ×1 IMPLANT
ELECT REM PT RETURN 9FT ADLT (ELECTROSURGICAL) ×2
ELECTRODE REM PT RTRN 9FT ADLT (ELECTROSURGICAL) ×1 IMPLANT
ENDOLOOP SUT PDS II  0 18 (SUTURE) ×1
ENDOLOOP SUT PDS II 0 18 (SUTURE) IMPLANT
GAUZE SPONGE 2X2 8PLY STRL LF (GAUZE/BANDAGES/DRESSINGS) IMPLANT
GLOVE BIO SURGEON STRL SZ7.5 (GLOVE) ×2 IMPLANT
GOWN STRL REUS W/ TWL LRG LVL3 (GOWN DISPOSABLE) ×3 IMPLANT
GOWN STRL REUS W/TWL LRG LVL3 (GOWN DISPOSABLE) ×6
IV CATH 14GX2 1/4 (CATHETERS) ×1 IMPLANT
KIT BASIN OR (CUSTOM PROCEDURE TRAY) ×2 IMPLANT
KIT TURNOVER KIT B (KITS) ×2 IMPLANT
NS IRRIG 1000ML POUR BTL (IV SOLUTION) ×2 IMPLANT
PAD ARMBOARD 7.5X6 YLW CONV (MISCELLANEOUS) ×2 IMPLANT
POUCH SPECIMEN RETRIEVAL 10MM (ENDOMECHANICALS) ×2 IMPLANT
SCISSORS LAP 5X35 DISP (ENDOMECHANICALS) ×2 IMPLANT
SET IRRIG TUBING LAPAROSCOPIC (IRRIGATION / IRRIGATOR) ×2 IMPLANT
SET TUBE SMOKE EVAC HIGH FLOW (TUBING) ×2 IMPLANT
SLEEVE ENDOPATH XCEL 5M (ENDOMECHANICALS) ×4 IMPLANT
SPECIMEN JAR SMALL (MISCELLANEOUS) ×2 IMPLANT
SPONGE GAUZE 2X2 STER 10/PKG (GAUZE/BANDAGES/DRESSINGS) ×1
SUT MNCRL AB 4-0 PS2 18 (SUTURE) ×2 IMPLANT
SUT VICRYL 0 AB UR-6 (SUTURE) ×1 IMPLANT
TAPE CLOTH SOFT 2X10 (GAUZE/BANDAGES/DRESSINGS) ×1 IMPLANT
TOWEL GREEN STERILE (TOWEL DISPOSABLE) ×2 IMPLANT
TOWEL GREEN STERILE FF (TOWEL DISPOSABLE) ×2 IMPLANT
TRAY LAPAROSCOPIC MC (CUSTOM PROCEDURE TRAY) ×2 IMPLANT
TROCAR XCEL BLUNT TIP 100MML (ENDOMECHANICALS) ×2 IMPLANT
TROCAR XCEL NON-BLD 5MMX100MML (ENDOMECHANICALS) ×2 IMPLANT
WATER STERILE IRR 1000ML POUR (IV SOLUTION) ×2 IMPLANT

## 2022-03-10 NOTE — Anesthesia Procedure Notes (Signed)
Procedure Name: Intubation Date/Time: 03/10/2022 2:12 PM Performed by: Mariea Clonts, CRNA Pre-anesthesia Checklist: Patient identified, Emergency Drugs available, Suction available and Patient being monitored Patient Re-evaluated:Patient Re-evaluated prior to induction Oxygen Delivery Method: Circle System Utilized Preoxygenation: Pre-oxygenation with 100% oxygen Induction Type: IV induction Ventilation: Mask ventilation without difficulty Laryngoscope Size: Miller and 2 Grade View: Grade I Tube type: Oral Tube size: 7.0 mm Number of attempts: 1 Airway Equipment and Method: Stylet and Oral airway Placement Confirmation: ETT inserted through vocal cords under direct vision, positive ETCO2 and breath sounds checked- equal and bilateral Tube secured with: Tape Dental Injury: Teeth and Oropharynx as per pre-operative assessment

## 2022-03-10 NOTE — H&P (Signed)
PROVIDER: Landry Corporal, MD  MRN: [id number] DOB: 06-02-1946 Subjective   Chief Complaint: Cholecystitis   History of Present Illness: Katie Weber is a 76 y.o. female who is seen today for cholecystitis. The patient is a 76 year old white female who was recently hospitalized with severe cholecystitis. She responded well to IV antibiotics and percutaneous drainage. She has had the drain for about 1 month. She is now ready to schedule her definitive surgery to remove the gallbladder.    Review of Systems: A complete review of systems was obtained from the patient. I have reviewed this information and discussed as appropriate with the patient. See HPI as well for other ROS.  ROS   Medical History: Past Medical History:  Diagnosis Date   Arthritis   GERD (gastroesophageal reflux disease)   Thyroid disease   Patient Active Problem List  Diagnosis   Cholecystitis   Past Surgical History:  Procedure Laterality Date   Left and Right Knee Surgery 2022    Allergies  Allergen Reactions   Iodinated Contrast Media Other (See Comments) and Shortness Of Breath  Tightness in chest. Trouble breathing   Ciprofloxacin Itching and Rash  Itching and rash on arm/chest   Penicillins Hives and Rash  .Marland KitchenHas patient had a PCN reaction causing immediate rash, facial/tongue/throat swelling, SOB or lightheadedness with hypotension: No Has patient had a PCN reaction causing severe rash involving mucus membranes or skin necrosis: No Has patient had a PCN reaction that required hospitalization No Has patient had a PCN reaction occurring within the last 10 years: No If all of the above answers are "NO", then may proceed with Cephalosporin use.   Current Outpatient Medications on File Prior to Visit  Medication Sig Dispense Refill   pantoprazole (PROTONIX) 40 MG DR tablet 1 tablet   levothyroxine (SYNTHROID) 100 MCG tablet daily   rosuvastatin (CRESTOR) 10 MG tablet 1 tablet   No  current facility-administered medications on file prior to visit.   Family History  Problem Relation Age of Onset   Endometrial cancer (Uterus cancer) Mother    Social History   Tobacco Use  Smoking Status Never  Smokeless Tobacco Never    Social History   Socioeconomic History   Marital status: Married  Tobacco Use   Smoking status: Never   Smokeless tobacco: Never  Substance and Sexual Activity   Alcohol use: Yes  Comment: 3 glasses per year   Drug use: Never   Objective:   Vitals:  BP: (!) 144/80  Pulse: 92  Temp: 36.7 C (98.1 F)  SpO2: 98%  Weight: 76.6 kg (168 lb 12.8 oz)  Height: 157.5 cm ('5\' 2"'$ )   Body mass index is 30.87 kg/m.  Physical Exam Vitals reviewed.  Constitutional:  General: She is not in acute distress. Appearance: Normal appearance.  HENT:  Head: Normocephalic and atraumatic.  Right Ear: External ear normal.  Left Ear: External ear normal.  Nose: Nose normal.  Mouth/Throat:  Mouth: Mucous membranes are moist.  Pharynx: Oropharynx is clear.  Eyes:  General: No scleral icterus. Extraocular Movements: Extraocular movements intact.  Conjunctiva/sclera: Conjunctivae normal.  Pupils: Pupils are equal, round, and reactive to light.  Cardiovascular:  Rate and Rhythm: Normal rate and regular rhythm.  Pulses: Normal pulses.  Heart sounds: Normal heart sounds.  Pulmonary:  Effort: Pulmonary effort is normal. No respiratory distress.  Breath sounds: Normal breath sounds.  Abdominal:  General: Bowel sounds are normal.  Palpations: Abdomen is soft.  Tenderness: There is no abdominal tenderness.  Comments: The abdomen is soft and nontender. There is a percutaneous drain exiting in the right upper quadrant. There is normal looking bile in the drain bag.  Musculoskeletal:  General: No swelling, tenderness or deformity. Normal range of motion.  Cervical back: Normal range of motion and neck supple.  Skin: General: Skin is warm and dry.   Coloration: Skin is not jaundiced.  Neurological:  General: No focal deficit present.  Mental Status: She is alert and oriented to person, place, and time.  Psychiatric:  Mood and Affect: Mood normal.  Behavior: Behavior normal.     Labs, Imaging and Diagnostic Testing:  Assessment and Plan:   Diagnoses and all orders for this visit:  Cholecystitis - [facility] Case Posting Request; Future    The patient was recently hospitalized with cholecystitis. She responded well to IV antibiotics and percutaneous drainage. She is now ready to have her definitive surgery. We will plan to leave the drain to bag drainage until the time of surgery to protect her so that she does not have a recurrence. I have discussed with her in detail the risks and benefits of the operation as well as some of the technical aspects including the risk of open surgery and the risk of common bile duct injury and she understands and wishes to proceed.

## 2022-03-10 NOTE — Op Note (Addendum)
03/10/2022  3:14 PM  PATIENT:  Katie Weber  76 y.o. female  PRE-OPERATIVE DIAGNOSIS:  cholecystitis  POST-OPERATIVE DIAGNOSIS:  cholecystitis, umbilical hernia  PROCEDURE:  Procedure(s): LAPAROSCOPIC CHOLECYSTECTOMY (N/A)  SURGEON:  Surgeon(s) and Role:    * Jovita Kussmaul, MD - Primary    * Dwan Bolt, MD - Assisting  PHYSICIAN ASSISTANT:   ASSISTANTS: Dr. Zenia Resides   ANESTHESIA:   local and general  EBL:  minimal   BLOOD ADMINISTERED:none  DRAINS: none   LOCAL MEDICATIONS USED:  MARCAINE     SPECIMEN:  Source of Specimen:  gallbladder  DISPOSITION OF SPECIMEN:  PATHOLOGY  COUNTS:  YES  TOURNIQUET:  * No tourniquets in log *  DICTATION: .Dragon Dictation    Procedure: After informed consent was obtained the patient was brought to the operating room and placed in the supine position on the operating room table. After adequate induction of general anesthesia the patient's abdomen was prepped with ChloraPrep allowed to dry and draped in usual sterile manner. An appropriate timeout was performed. The area below the umbilicus was infiltrated with quarter percent  Marcaine. A small incision was made with a 15 blade knife. The incision was carried down through the subcutaneous tissue bluntly with a hemostat and [employer] retractors. The linea alba was identified. The linea alba was incised with a 15 blade knife and each side was grasped with Coker clamps. The preperitoneal space was then probed with a hemostat until the peritoneum was opened and access was gained to the abdominal cavity. A 0 Vicryl pursestring stitch was placed in the fascia surrounding the opening. A Hassan cannula was then placed through the opening and anchored in place with the previously placed Vicryl purse string stitch. The abdomen was insufflated with carbon dioxide without difficulty. A laparoscope was inserted through the [facility] cannula in the right upper quadrant was inspected. Next the  epigastric region was infiltrated with % Marcaine. A small incision was made with a 15 blade knife. A 5 mm port was placed bluntly through this incision into the abdominal cavity under direct vision. Next 2 sites were chosen laterally on the right side of the abdomen for placement of 5 mm ports. Each of these areas was infiltrated with quarter percent Marcaine. Small stab incisions were made with a 15 blade knife. 5 mm ports were then placed bluntly through these incisions into the abdominal cavity under direct vision without difficulty. A blunt grasper was placed through the lateralmost 5 mm port and used to grasp the dome of the gallbladder and elevate it anteriorly and superiorly. Another blunt grasper was placed through the other 5 mm port and used to retract the body and neck of the gallbladder.  The gallbladder was contracted with some chronic inflammatory change.  The percutaneous drain was still in place.  Because of the amount of inflammation I elected to do a top-down dissection using the hook cautery.  Once the dissection reached the bottom of the gallbladder near the junction with the cystic duct I decided to control the cystic duct with a 0 PDS Endoloop.  The neck of the gallbladder just above the Endoloop was then divided with the electrocautery.  Posterior to this the cystic artery was identified and dissected circumferentially.  The artery was controlled with 3 clips proximally and 1 distally and the artery was divided between the 2 sets of clips.  The camera was moved to the epigastric port.  A laparoscopic bag was inserted through the [facility] cannula.  The gallbladder was placed in the bag and the bag was sealed.  The gallbladder and bag were then removed through the infraumbilical port without difficulty.  A laparoscopic bag was inserted through the hassan port. The laparoscope was moved to the epigastric port. The gallbladder was placed within the bag and the bag was sealed.  The bag with the  gallbladder was then removed with the [facility] cannula through the infraumbilical port without difficulty.  The Hassan cannula was replaced.  The abdomen was irrigated with copious amounts of saline.  The liver bed was inspected and found to be hemostatic.  The percutaneous drain was removed and the drain tract was divided and fulgurated with cautery.  The camera was then moved back to the epigastric port again.  The Hassan cannula was removed.  There was a small umbilical hernia just above the port site.  This hernia was joined to the port site fascial defect and the entire fascial defect was closed with multiple 0 Vicryl stitches.  The fascial defect was then closed with the previously placed Vicryl pursestring stitch as well as with another figure-of-eight 0 Vicryl stitch. The liver bed was inspected again and found to be hemostatic. The abdomen was irrigated with copious amounts of saline until the effluent was clear. The ports were then removed under direct vision without difficulty and were found to be hemostatic. The gas was allowed to escape. No other abnormalities were noted on general inspection of the abdomen. The skin incisions were all closed with interrupted 4-0 Monocryl subcuticular stitches. Dermabond dressings were applied. The patient tolerated the procedure well. At the end of the case all needle sponge and instrument counts were correct. The patient was then awakened and taken to recovery in stable condition.  The assistant was essential for retraction and visualization during the case.  PLAN OF CARE: Discharge to home after PACU  PATIENT DISPOSITION:  PACU - hemodynamically stable.   Delay start of Pharmacological VTE agent (>24hrs) due to surgical blood loss or risk of bleeding: not applicable

## 2022-03-10 NOTE — Transfer of Care (Signed)
Immediate Anesthesia Transfer of Care Note  Patient: Katie Weber  Procedure(s) Performed: LAPAROSCOPIC CHOLECYSTECTOMY (Abdomen)  Patient Location: PACU  Anesthesia Type:General  Level of Consciousness: awake, alert  and oriented  Airway & Oxygen Therapy: Patient Spontanous Breathing and Patient connected to nasal cannula oxygen  Post-op Assessment: Report given to RN and Post -op Vital signs reviewed and stable  Post vital signs: Reviewed and stable  Last Vitals:  Vitals Value Taken Time  BP 144/55 03/10/22 1531  Temp    Pulse 65 03/10/22 1532  Resp 17 03/10/22 1532  SpO2 99 % 03/10/22 1532  Vitals shown include unvalidated device data.  Last Pain:  Vitals:   03/10/22 1220  TempSrc:   PainSc: 0-No pain         Complications: No notable events documented.

## 2022-03-10 NOTE — Interval H&P Note (Signed)
History and Physical Interval Note:  03/10/2022 12:39 PM  Katie Weber  has presented today for surgery, with the diagnosis of cholecystitis.  The various methods of treatment have been discussed with the patient and family. After consideration of risks, benefits and other options for treatment, the patient has consented to  Procedure(s): LAPAROSCOPIC CHOLECYSTECTOMY WITH INTRAOPERATIVE CHOLANGIOGRAM, POSSIBLE OPEN (N/A) as a surgical intervention.  The patient's history has been reviewed, patient examined, no change in status, stable for surgery.  I have reviewed the patient's chart and labs.  Questions were answered to the patient's satisfaction.     [name]

## 2022-03-10 NOTE — Anesthesia Preprocedure Evaluation (Addendum)
Anesthesia Evaluation  Patient identified by MRN, date of birth, ID band Patient awake    Reviewed: Allergy & Precautions, NPO status , Patient's Chart, lab work & pertinent test results  History of Anesthesia Complications (+) Family history of anesthesia reactionNegative for: history of anesthetic complications  Airway Mallampati: II  TM Distance: >3 FB Neck ROM: Full    Dental no notable dental hx. (+) Dental Advisory Given   Pulmonary asthma , pneumonia,    Pulmonary exam normal breath sounds clear to auscultation       Cardiovascular pulmonary hypertension+ CAD (by calcium score; negative stress test 2018)  Normal cardiovascular exam Rhythm:Regular Rate:Normal  Echo 01/2022 1. Left ventricular ejection fraction, by estimation, is 60 to 65%. The left ventricle has normal function. The left ventricle has no regional wall motion abnormalities. Left ventricular diastolic parameters were normal.  2. Right ventricular systolic function is normal. The right ventricular size is normal. There is mildly elevated pulmonary artery systolic pressure.  3. The mitral valve is normal in structure. Mild mitral valve  regurgitation. No evidence of mitral stenosis.  4. The aortic valve is tricuspid. Aortic valve regurgitation is not visualized. No aortic stenosis is present.  5. The inferior vena cava is dilated in size with >50% respiratory variability, suggesting right atrial pressure of 8 mmHg.    Neuro/Psych negative neurological ROS     GI/Hepatic Neg liver ROS, hiatal hernia, GERD  ,  Endo/Other  Hypothyroidism   Renal/GU negative Renal ROS     Musculoskeletal  (+) Arthritis ,   Abdominal   Peds  Hematology  (+) Blood dyscrasia, anemia ,   Anesthesia Other Findings   Reproductive/Obstetrics                            Anesthesia Physical  Anesthesia Plan  ASA: 3  Anesthesia Plan: General    Post-op Pain Management:  Regional for Post-op pain and Tylenol PO (pre-op)*, Gabapentin PO (pre-op)* and Celebrex PO (pre-op)*   Induction: Intravenous  PONV Risk Score and Plan: 4 or greater and Treatment may vary due to age or medical condition, Ondansetron and Dexamethasone  Airway Management Planned: Oral ETT  Additional Equipment: None  Intra-op Plan:   Post-operative Plan: Extubation in OR  Informed Consent: I have reviewed the patients History and Physical, chart, labs and discussed the procedure including the risks, benefits and alternatives for the proposed anesthesia with the patient or authorized representative who has indicated his/her understanding and acceptance.       Plan Discussed with: CRNA  Anesthesia Plan Comments:        Anesthesia Quick Evaluation

## 2022-03-10 NOTE — Anesthesia Postprocedure Evaluation (Signed)
Anesthesia Post Note  Patient: Katie Weber  Procedure(s) Performed: LAPAROSCOPIC CHOLECYSTECTOMY (Abdomen)     Patient location during evaluation: PACU Anesthesia Type: General Level of consciousness: sedated and patient cooperative Pain management: pain level controlled Vital Signs Assessment: post-procedure vital signs reviewed and stable Respiratory status: spontaneous breathing Cardiovascular status: stable Anesthetic complications: no   No notable events documented.  Last Vitals:  Vitals:   03/10/22 1600 03/10/22 1615  BP: 139/65 129/79  Pulse: 68 73  Resp: 16 (!) 21  Temp:  36.8 C  SpO2: 94% 94%    Last Pain:  Vitals:   03/10/22 1615  TempSrc:   PainSc: 2                  Nolon Nations

## 2022-03-11 ENCOUNTER — Encounter (HOSPITAL_COMMUNITY): Payer: Self-pay | Admitting: General Surgery

## 2022-03-12 ENCOUNTER — Other Ambulatory Visit: Payer: Self-pay

## 2022-03-12 ENCOUNTER — Encounter (HOSPITAL_BASED_OUTPATIENT_CLINIC_OR_DEPARTMENT_OTHER): Payer: Self-pay | Admitting: Emergency Medicine

## 2022-03-12 ENCOUNTER — Emergency Department (HOSPITAL_BASED_OUTPATIENT_CLINIC_OR_DEPARTMENT_OTHER): Payer: Medicare Other

## 2022-03-12 ENCOUNTER — Emergency Department (HOSPITAL_BASED_OUTPATIENT_CLINIC_OR_DEPARTMENT_OTHER): Payer: Medicare Other | Admitting: Radiology

## 2022-03-12 ENCOUNTER — Emergency Department (HOSPITAL_BASED_OUTPATIENT_CLINIC_OR_DEPARTMENT_OTHER)
Admission: EM | Admit: 2022-03-12 | Discharge: 2022-03-12 | Disposition: A | Discharge status: Home / Self Care | Payer: Medicare Other | Source: Home / Self Care | Attending: Emergency Medicine | Admitting: Emergency Medicine

## 2022-03-12 DIAGNOSIS — R1012 Left upper quadrant pain: Secondary | ICD-10-CM | POA: Insufficient documentation

## 2022-03-12 DIAGNOSIS — R1930 Abdominal rigidity, unspecified site: Secondary | ICD-10-CM | POA: Insufficient documentation

## 2022-03-12 DIAGNOSIS — G8918 Other acute postprocedural pain: Secondary | ICD-10-CM | POA: Insufficient documentation

## 2022-03-12 DIAGNOSIS — D72829 Elevated white blood cell count, unspecified: Secondary | ICD-10-CM | POA: Insufficient documentation

## 2022-03-12 LAB — COMPREHENSIVE METABOLIC PANEL
ALT: 20 U/L (ref 0–44)
AST: 20 U/L (ref 15–41)
Albumin: 4.5 g/dL (ref 3.5–5.0)
Alkaline Phosphatase: 52 U/L (ref 38–126)
Anion gap: 10 (ref 5–15)
BUN: 16 mg/dL (ref 8–23)
CO2: 27 mmol/L (ref 22–32)
Calcium: 9.4 mg/dL (ref 8.9–10.3)
Chloride: 101 mmol/L (ref 98–111)
Creatinine, Ser: 0.75 mg/dL (ref 0.44–1.00)
GFR, Estimated: >60 mL/min (ref >60)
Glucose, Bld: 115 mg/dL — ABNORMAL HIGH (ref 70–99)
Potassium: 3.8 mmol/L (ref 3.5–5.1)
Sodium: 138 mmol/L (ref 135–145)
Total Bilirubin: 1.1 mg/dL (ref 0.3–1.2)
Total Protein: 8.1 g/dL (ref 6.5–8.1)

## 2022-03-12 LAB — URINALYSIS, ROUTINE W REFLEX MICROSCOPIC
Bilirubin Urine: NEGATIVE
Glucose, UA: NEGATIVE mg/dL
Ketones, ur: 40 mg/dL — AB
Leukocytes,Ua: NEGATIVE
Nitrite: NEGATIVE
Protein, ur: 30 mg/dL — AB
Specific Gravity, Urine: 1.03 (ref 1.005–1.030)
pH: 6 (ref 5.0–8.0)

## 2022-03-12 LAB — CBC
HCT: 42 % (ref 36.0–46.0)
Hemoglobin: 13.3 g/dL (ref 12.0–15.0)
MCH: 27.4 pg (ref 26.0–34.0)
MCHC: 31.7 g/dL (ref 30.0–36.0)
MCV: 86.6 fL (ref 80.0–100.0)
Platelets: 272 10*3/uL (ref 150–400)
RBC: 4.85 MIL/uL (ref 3.87–5.11)
RDW: 16 % — ABNORMAL HIGH (ref 11.5–15.5)
WBC: 16.9 10*3/uL — ABNORMAL HIGH (ref 4.0–10.5)
nRBC: 0 % (ref 0.0–0.2)

## 2022-03-12 LAB — LIPASE, BLOOD: Lipase: 14 U/L (ref 11–51)

## 2022-03-12 IMAGING — CT CT ABD-PELV W/O CM
2 of 4 series · 16 of 46 positions shown, 18 images · non-contrast
Comparison: [DATE] CT and prior studies

CLINICAL DATA: 75-year-old female with acute abdominal and pelvic
pain. Cholecystectomy on [DATE].



[Series 2: abd pel wo · axial · 0.75mm/px · z∈[-403,-8]mm · 13 of 87 slices shown, 15 images]
[im 4/87  soft-tissue]
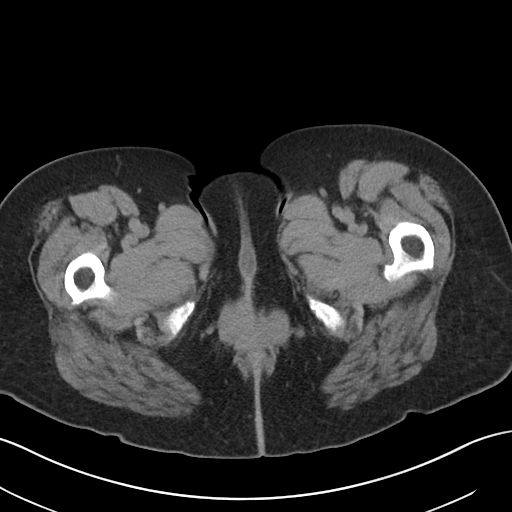
[im 4/87  bone]
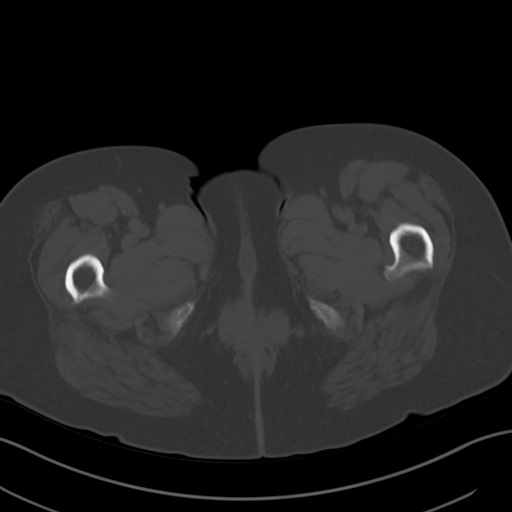
[im 11/87  soft-tissue]
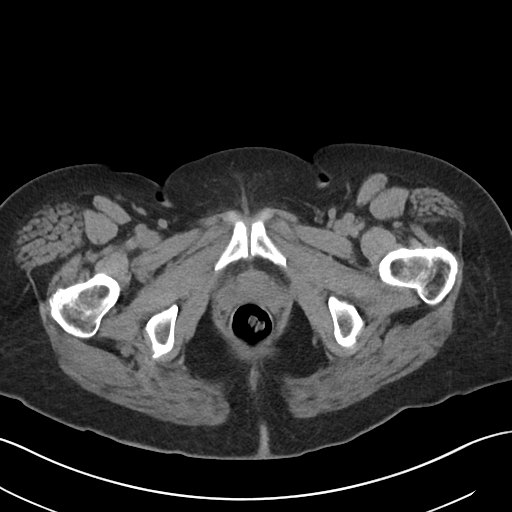
[im 18/87  soft-tissue]
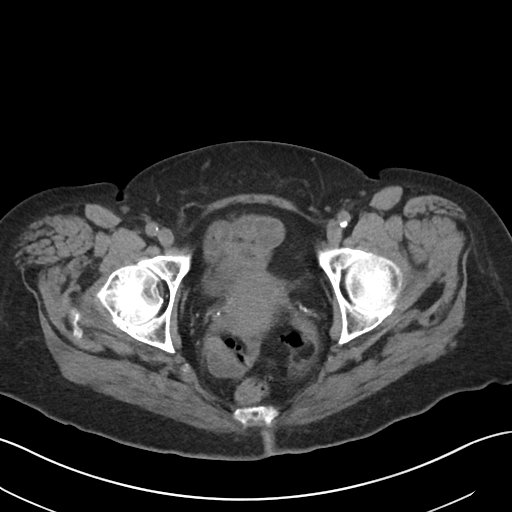
[im 26/87  soft-tissue]
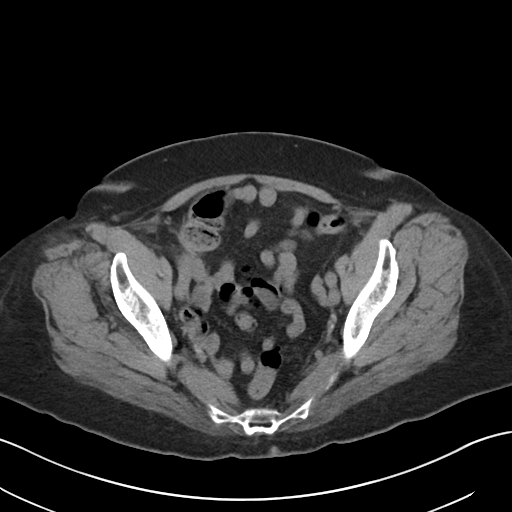
[im 29/87  soft-tissue]
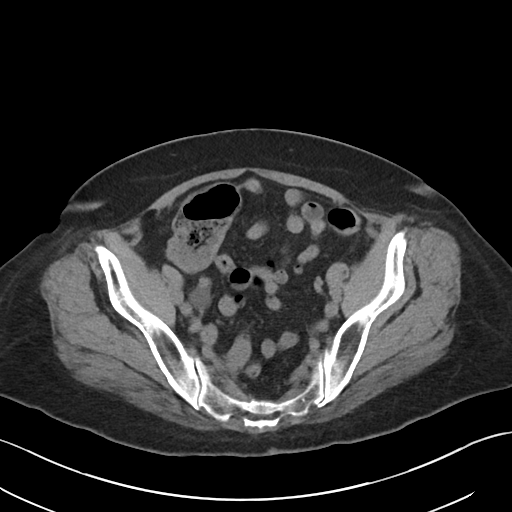
[im 36/87  soft-tissue]
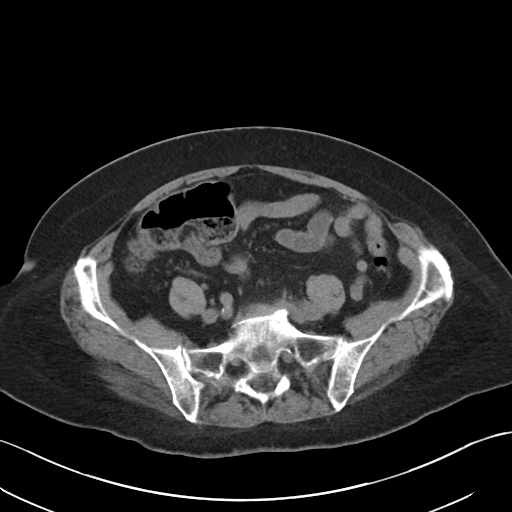
[im 44/87  soft-tissue]
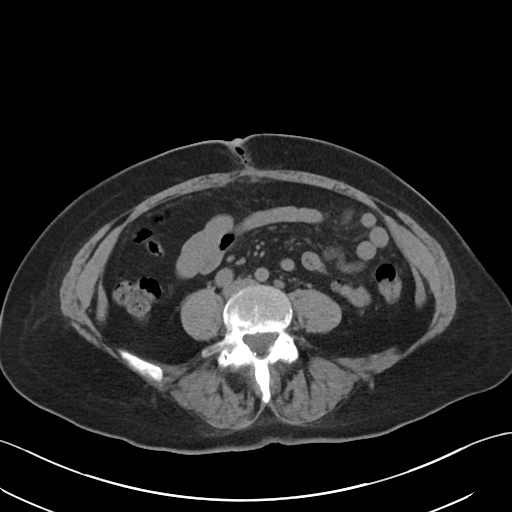
[im 51/87  soft-tissue]
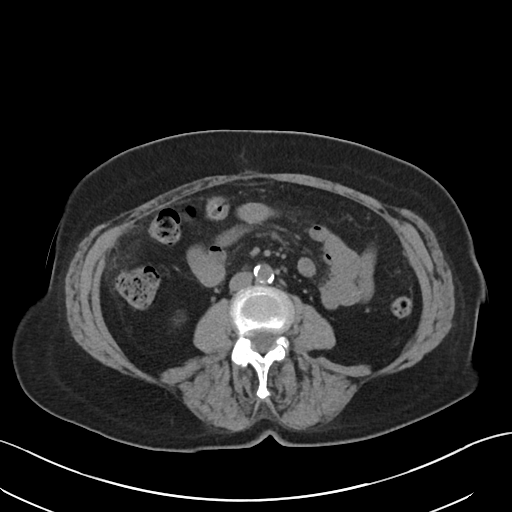
[im 58/87  soft-tissue]
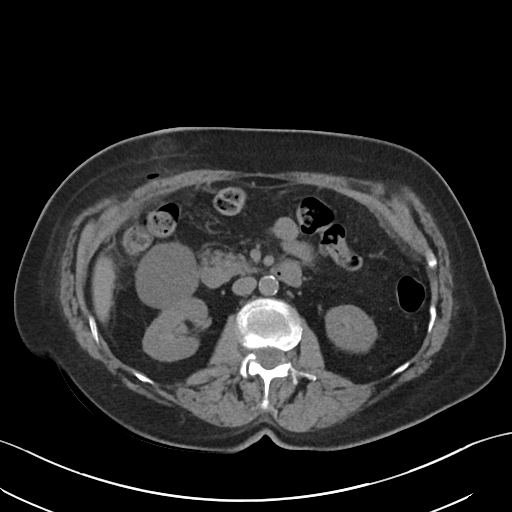
[im 58/87  bone]
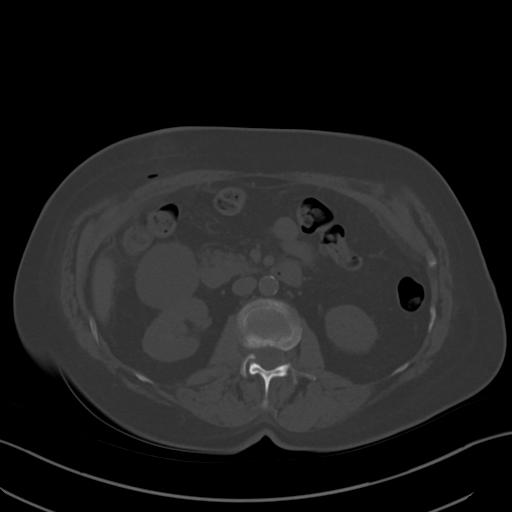
[im 61/87  soft-tissue]
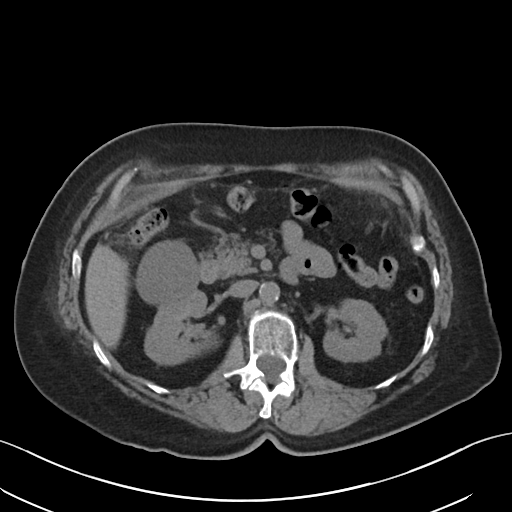
[im 69/87  soft-tissue]
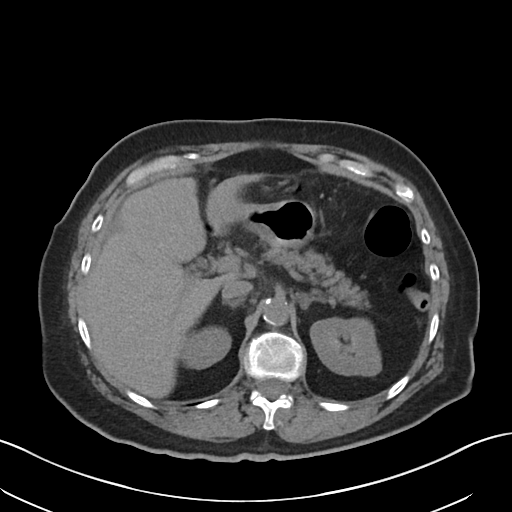
[im 76/87  soft-tissue]
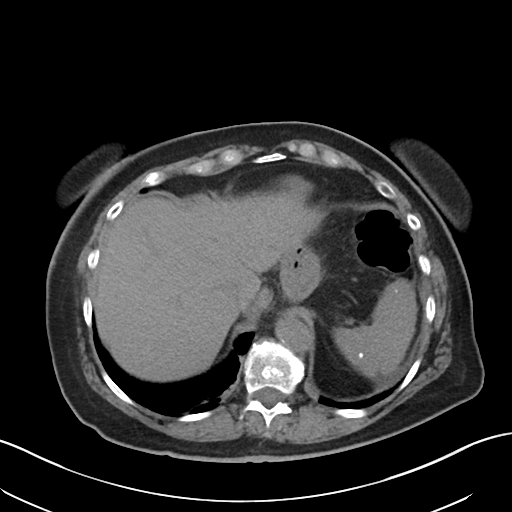
[im 83/87  soft-tissue]
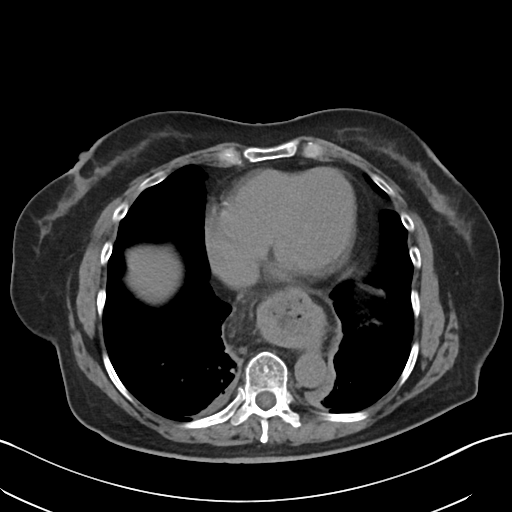

[Series 5: coronal · coronal · 0.87mm/px · 3 of 88 slices shown]
[im 30/88  soft-tissue]
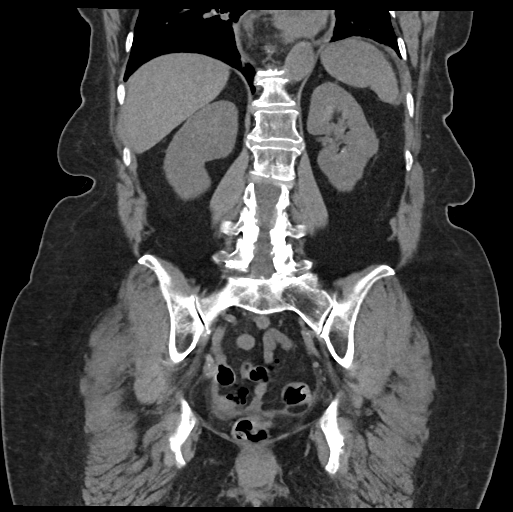
[im 39/88  soft-tissue]
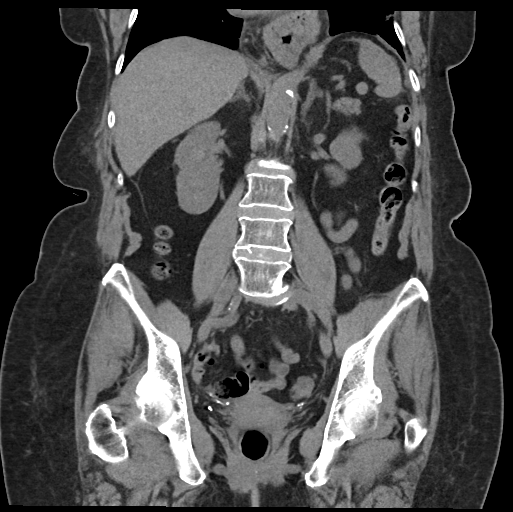
[im 49/88  soft-tissue]
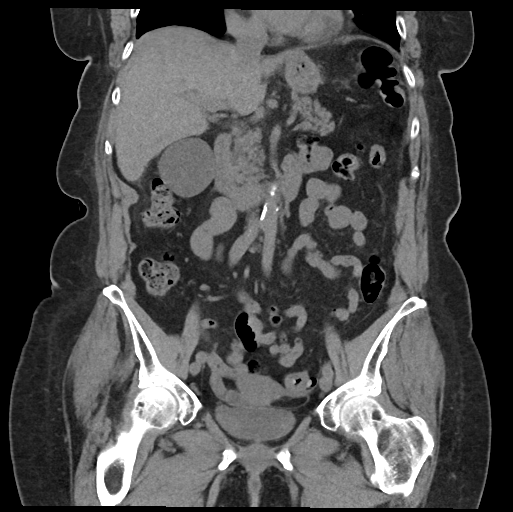

[16 of 46 positions shown; findings below may reference images not displayed]

FINDINGS: Please note that parenchymal and vascular abnormalities may be
missed as intravenous contrast was not administered.

Lower chest: Mild RIGHT basilar atelectasis noted.

Hepatobiliary: Patient is status post cholecystectomy. Mild
stranding in the cholecystectomy bed noted without focal collection
or gas in the cholecystectomy bed.

The liver is unremarkable. There is no evidence of intrahepatic or
extrahepatic biliary dilatation.

Pancreas: Unremarkable

Spleen: Unremarkable.

Adrenals/Urinary Tract: Punctate nonobstructing bilateral renal
calculi are identified. A RIGHT renal cyst is again identified and
needs no follow-up.

The adrenal glands and bladder are unremarkable.

Stomach/Bowel: A large hiatal hernia is again noted. Appendix
appears normal. No evidence of bowel wall thickening, distention, or
inflammatory changes. Small bowel and colonic diverticulosis noted
without evidence of acute diverticulitis.

Vascular/Lymphatic: Aortic atherosclerosis. No enlarged abdominal or
pelvic lymph nodes.

Reproductive: Uterus and bilateral adnexa are unremarkable.

Other: A trace amount of free fluid in the pelvis and adjacent to
the spleen are noted, not expected given recent surgery.

No evidence of pneumoperitoneum or focal collection/abscess.
Stranding/small foci of gas in the anterior abdominal subcutaneous
tissues and umbilicus are compatible with laparoscopic surgical
changes.

Musculoskeletal: No acute or suspicious bony abnormalities are
noted. Degenerative changes in the lumbar spine again noted.
IMPRESSION: 1. Status post cholecystectomy without definite complicating
features. Mild stranding in the cholecystectomy bed and trace amount
of free fluid in the pelvis and adjacent to the spleen, not expected
given recent surgery.
2. Punctate nonobstructing bilateral renal calculi.
3. Mild RIGHT basilar atelectasis.
4. Large hiatal hernia.
5. Aortic Atherosclerosis ([V4]-[V4]).

## 2022-03-12 IMAGING — DX DG CHEST 2V
2 series · 2 of 2 positions shown · non-contrast
Comparison: [DATE]

CLINICAL DATA: Recent cholecystectomy presenting with pain
radiating from the left shoulder into the left upper quadrant.

EXAM:
CHEST - 2 VIEW

[chest lat]
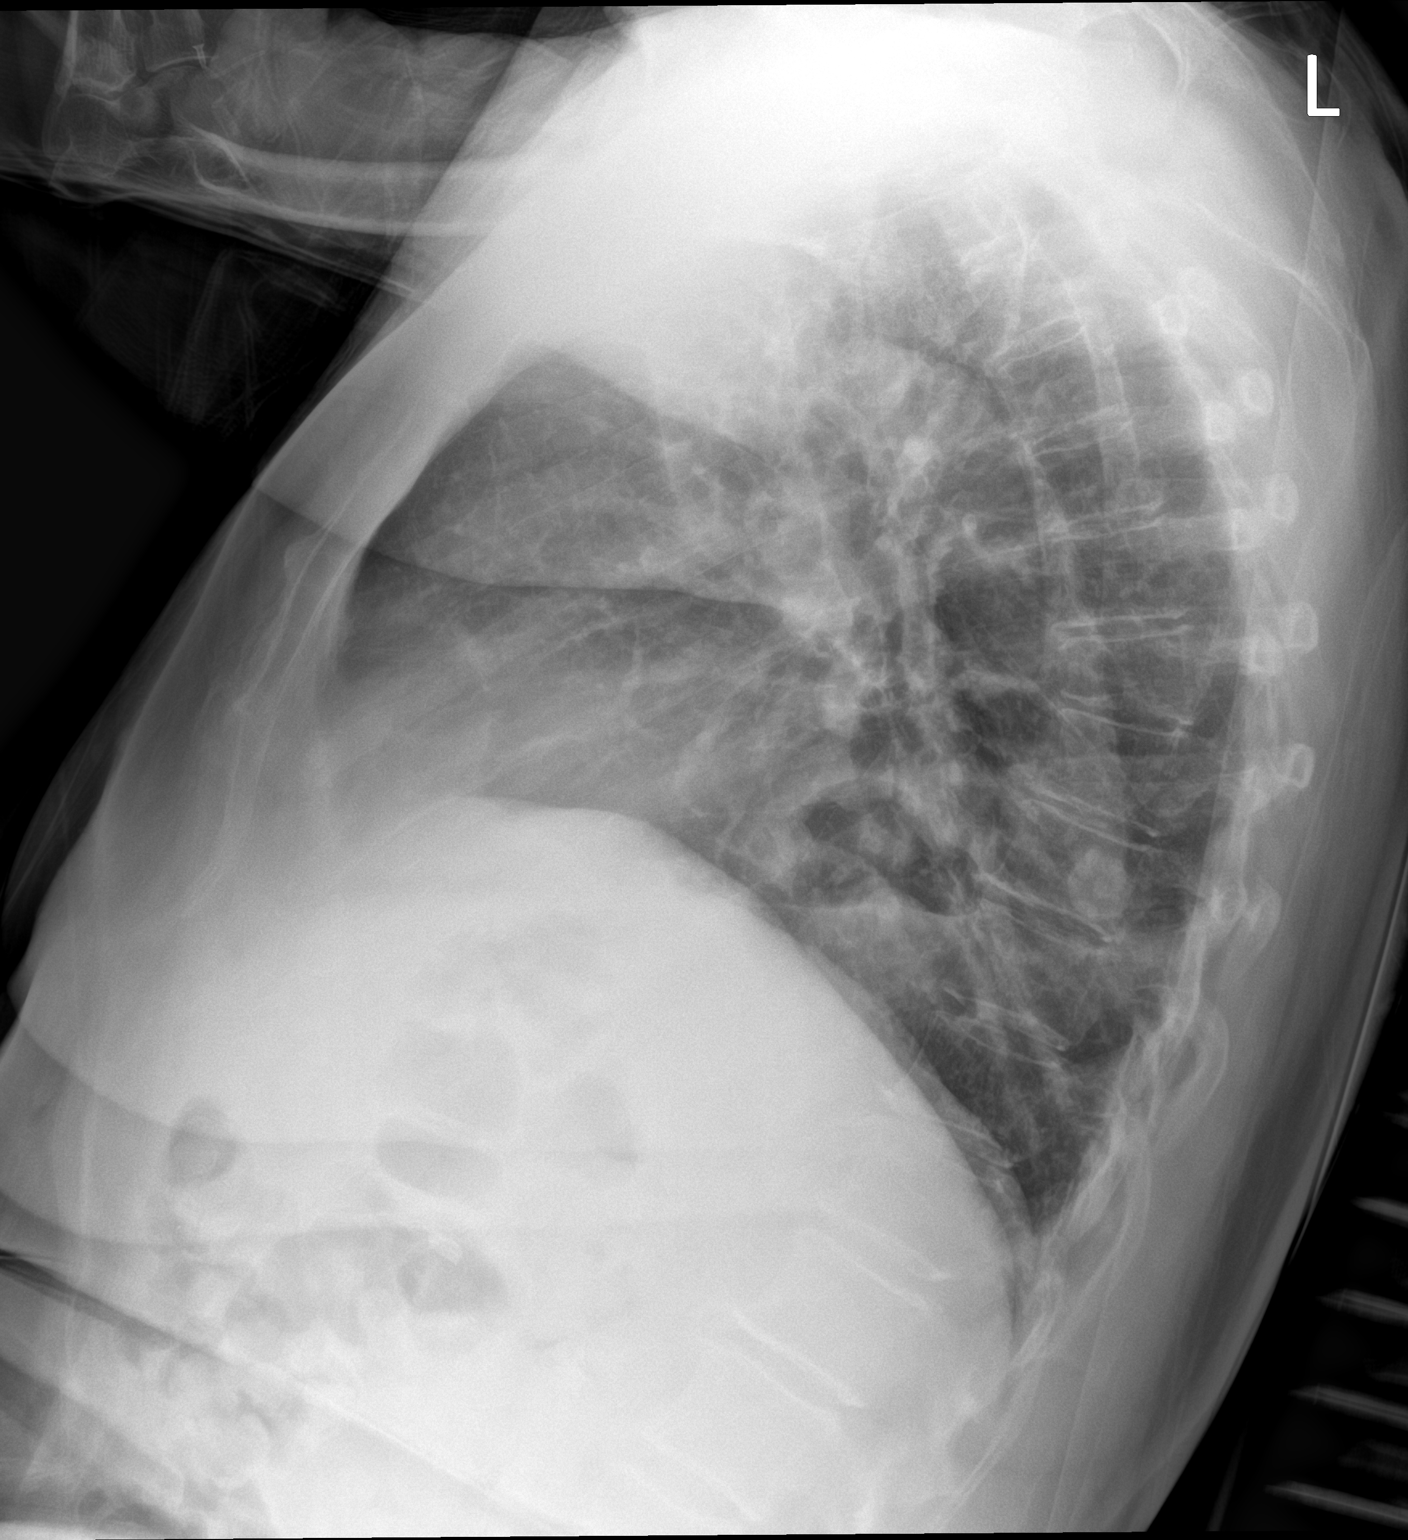

[chest ap]
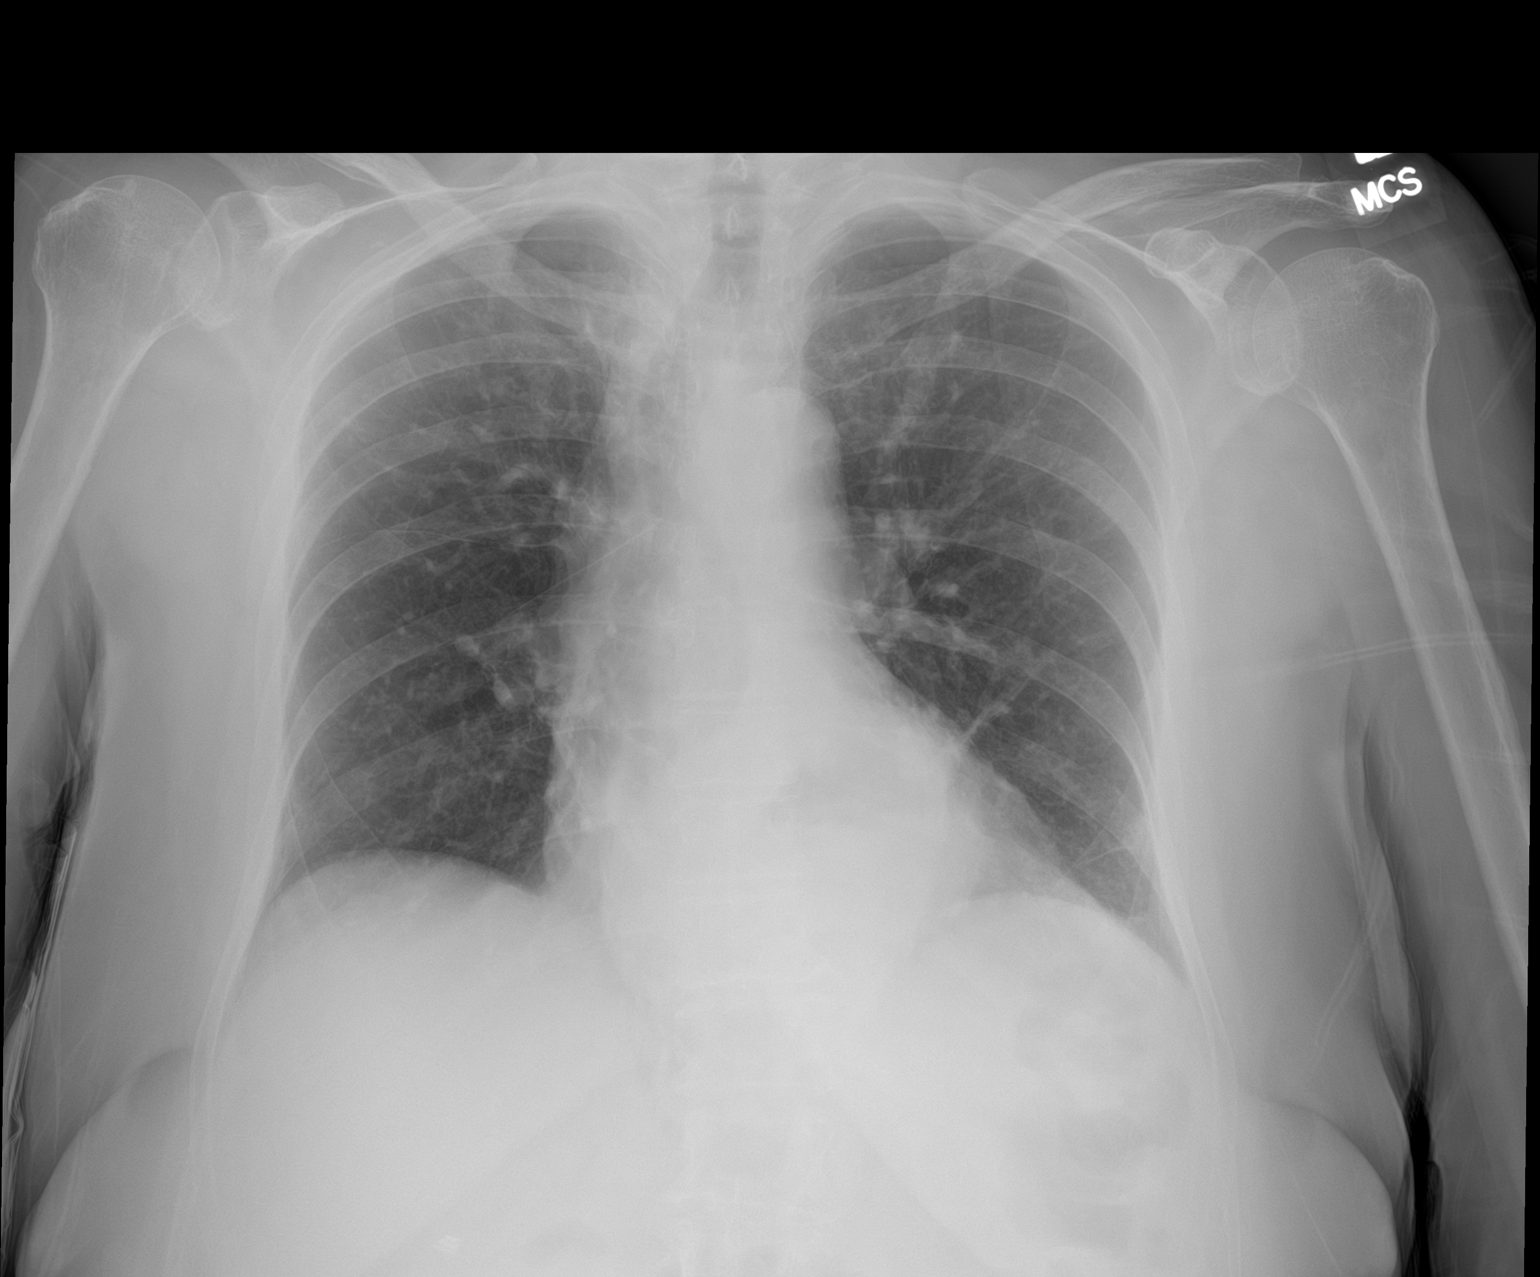

[2 of 2 positions shown; findings below may reference images not displayed]

FINDINGS: The heart size and mediastinal contours are within normal limits.
There is a moderate-sized hiatal hernia. A trace amount of left
basilar linear atelectasis is seen. Both lungs are otherwise clear.
Radiopaque surgical clips are seen within the right upper quadrant.
The visualized skeletal structures are unremarkable.
IMPRESSION: 1. Trace amount of left basilar linear atelectasis.
2. Moderate-sized hiatal hernia.

## 2022-03-12 MED ORDER — HYDROMORPHONE HCL 1 MG/ML IJ SOLN
1.0000 mg | Freq: Once | INTRAMUSCULAR | Status: AC
  Administered 2022-03-12: 1 mg via INTRAVENOUS
  Filled 2022-03-12: qty 1

## 2022-03-12 MED ORDER — FENTANYL CITRATE PF 50 MCG/ML IJ SOSY
50.0000 ug | PREFILLED_SYRINGE | INTRAMUSCULAR | Status: DC | PRN
  Administered 2022-03-12: 50 ug via INTRAVENOUS
  Filled 2022-03-12: qty 1

## 2022-03-12 NOTE — Discharge Instructions (Signed)
Call Dr. Marlou Starks tomorrow to schedule a follow-up visit.  Go to [facility] long hospital if you develop fever, vomiting, worsening pain.  Take 1 tablet of the oxycodone every 4 hours as we discussed as needed.

## 2022-03-12 NOTE — ED Provider Notes (Signed)
[facility] Provider Note   CSN: [id number] Arrival date & time: 03/12/22  1620     History  Chief Complaint  Patient presents with   Post-op Problem    Katie Weber is a 76 y.o. female.  76 year old female presents with pain to her left upper quadrant radiates to her left chest.  Denies any cough congestion.  Patient had a gallbladder surgery 2 days ago.  Denies any fever or chills.  Pain is severe and worse with movement.  No urinary symptoms.  No respiratory symptoms.  Called her surgeon and told to come here      Home Medications Prior to Admission medications   Medication Sig Start Date End Date Taking? Authorizing Provider  alendronate (FOSAMAX) 70 MG tablet Take 70 mg by mouth once a week. Take with a full glass of water on an empty stomach. Take on Fridays    [provider]  Cholecalciferol (VITAMIN D) 50 MCG (2000 UT) tablet Take 2,000 Units by mouth 3 (three) times a week.    [provider]  levothyroxine (SYNTHROID, LEVOTHROID) 100 MCG tablet Take 100 mcg by mouth daily before breakfast.    [provider]  oxyCODONE (ROXICODONE) 5 MG immediate release tablet Take 1 tablet (5 mg total) by mouth every 6 (six) hours as needed for severe pain. 03/10/22   [name], MD  pantoprazole (PROTONIX) 40 MG tablet Take 40 mg by mouth daily. 01/04/22   [provider]  rosuvastatin (CRESTOR) 10 MG tablet Take 10 mg by mouth 2 (two) times a week.    [provider]  sodium chloride flush 0.9 % SOLN injection Flush gallbladder drain once daily with 5 ml syringe. 01/10/22   Allred, Darrell K, PA-C  vitamin B-12 (CYANOCOBALAMIN) 1000 MCG tablet Take 1,000 mcg by mouth 3 (three) times a week.    [provider]  vitamin C (ASCORBIC ACID) 500 MG tablet Take 500 mg by mouth 3 (three) times a week.    [provider]  zinc gluconate 50 MG tablet Take 50 mg by mouth 3 (three) times a week.     [provider]      Allergies    Contrast media [iodinated contrast media], Ciprofloxacin, Clindamycin/lincomycin, and Penicillins    Review of Systems   Review of Systems  All other systems reviewed and are negative.  Physical Exam Updated Vital Signs BP (!) 169/76 (BP Location: Right Arm)   Pulse 81   Temp 98.2 F (36.8 C)   Resp 15   SpO2 93%  Physical Exam Vitals and nursing note reviewed.  Constitutional:      General: She is not in acute distress.    Appearance: Normal appearance. She is well-developed. She is not toxic-appearing.  HENT:     Head: Normocephalic and atraumatic.  Eyes:     General: Lids are normal.     Conjunctiva/sclera: Conjunctivae normal.     Pupils: Pupils are equal, round, and reactive to light.  Neck:     Thyroid: No thyroid mass.     Trachea: No tracheal deviation.  Cardiovascular:     Rate and Rhythm: Normal rate and regular rhythm.     Heart sounds: Normal heart sounds. No murmur heard.   No gallop.  Pulmonary:     Effort: Pulmonary effort is normal. No respiratory distress.     Breath sounds: Normal breath sounds. No stridor. No decreased breath sounds, wheezing, rhonchi or rales.  Abdominal:  General: There is no distension.     Palpations: Abdomen is soft.     Tenderness: There is abdominal tenderness in the left upper quadrant. There is guarding. There is no rebound.    Musculoskeletal:        General: No tenderness. Normal range of motion.     Cervical back: Normal range of motion and neck supple.  Skin:    General: Skin is warm and dry.     Findings: No abrasion or rash.  Neurological:     Mental Status: She is alert and oriented to person, place, and time. Mental status is at baseline.     GCS: GCS eye subscore is 4. GCS verbal subscore is 5. GCS motor subscore is 6.     Cranial Nerves: No cranial nerve deficit.     Sensory: No sensory deficit.     Motor: Motor function is intact.  Psychiatric:         Attention and Perception: Attention normal.        Speech: Speech normal.        Behavior: Behavior normal.    ED Results / Procedures / Treatments   Labs (all labs ordered are listed, but only abnormal results are displayed) Labs Reviewed  COMPREHENSIVE METABOLIC PANEL - Abnormal; Notable for the following components:      Result Value   Glucose, Bld 115 (*)    All other components within normal limits  CBC - Abnormal; Notable for the following components:   WBC 16.9 (*)    RDW 16.0 (*)    All other components within normal limits  LIPASE, BLOOD  URINALYSIS, ROUTINE W REFLEX MICROSCOPIC    EKG None  Radiology No results found.  Procedures Procedures    Medications Ordered in ED Medications  fentaNYL (SUBLIMAZE) injection 50 mcg (50 mcg Intravenous Given 03/12/22 1708)  HYDROmorphone (DILAUDID) injection 1 mg (1 mg Intravenous Given 03/12/22 1752)    ED Course/ Medical Decision Making/ A&P                           Medical Decision Making Amount and/or Complexity of Data Reviewed Labs: ordered. Radiology: ordered.  Risk Prescription drug management.   Patient presented with left abdominal pain after having gallbladder surgery a few days ago.  Her abdominal CT per my review and interpretation did not show any evidence of significant new fluid collection or infection.  I did speak with radiologist about these findings as well 2.  She has mild leukocytosis on her CBC.  Urinalysis negative for infection.  Chest x-ray performed and per my interpretation shows no acute infiltrate.  Patient abdomen reexamined and appears to be nonsurgical at this time.  Plan will be for patient to take oxycodone 1 tablet every 4 hours.  She will follow-up with the surgeon this week.  Strict return precautions given        Final Clinical Impression(s) / ED Diagnoses Final diagnoses:  None    Rx / DC Orders ED Discharge Orders     None         Lacretia Leigh, MD 03/12/22  2154

## 2022-03-12 NOTE — ED Notes (Signed)
Pt verbalizes understanding of discharge instructions. Opportunity for questioning and answers were provided. Pt discharged from ED to home with husband.    

## 2022-03-12 NOTE — ED Triage Notes (Signed)
Pt reports gall bladder removal and Friday and awoke this morning at 5am with "excruitiating pain" unrelieved by oxycodone Rx at home along with ES tylenol  Pain in left shoulder radiating down to LUQ.

## 2022-03-13 ENCOUNTER — Ambulatory Visit: Payer: Self-pay | Admitting: General Surgery

## 2022-03-13 ENCOUNTER — Inpatient Hospital Stay (HOSPITAL_COMMUNITY)
Admission: AD | Admit: 2022-03-13 | Discharge: 2022-03-18 | DRG: 356 | Disposition: A | Discharge status: Home / Self Care | Payer: Medicare Other | Source: Ambulatory Visit | Attending: Surgery | Admitting: Surgery

## 2022-03-13 DIAGNOSIS — I2510 Atherosclerotic heart disease of native coronary artery without angina pectoris: Secondary | ICD-10-CM | POA: Diagnosis not present

## 2022-03-13 DIAGNOSIS — J9811 Atelectasis: Secondary | ICD-10-CM | POA: Diagnosis present

## 2022-03-13 DIAGNOSIS — E861 Hypovolemia: Secondary | ICD-10-CM | POA: Diagnosis present

## 2022-03-13 DIAGNOSIS — K429 Umbilical hernia without obstruction or gangrene: Secondary | ICD-10-CM | POA: Diagnosis present

## 2022-03-13 DIAGNOSIS — E860 Dehydration: Secondary | ICD-10-CM | POA: Diagnosis present

## 2022-03-13 DIAGNOSIS — Z881 Allergy status to other antibiotic agents status: Secondary | ICD-10-CM

## 2022-03-13 DIAGNOSIS — K219 Gastro-esophageal reflux disease without esophagitis: Secondary | ICD-10-CM | POA: Diagnosis present

## 2022-03-13 DIAGNOSIS — E872 Acidosis, unspecified: Secondary | ICD-10-CM | POA: Diagnosis present

## 2022-03-13 DIAGNOSIS — K659 Peritonitis, unspecified: Secondary | ICD-10-CM | POA: Diagnosis present

## 2022-03-13 DIAGNOSIS — R0689 Other abnormalities of breathing: Secondary | ICD-10-CM | POA: Diagnosis not present

## 2022-03-13 DIAGNOSIS — Z79890 Hormone replacement therapy (postmenopausal): Secondary | ICD-10-CM | POA: Diagnosis not present

## 2022-03-13 DIAGNOSIS — Z880 Allergy status to penicillin: Secondary | ICD-10-CM | POA: Diagnosis not present

## 2022-03-13 DIAGNOSIS — R109 Unspecified abdominal pain: Secondary | ICD-10-CM | POA: Insufficient documentation

## 2022-03-13 DIAGNOSIS — M810 Age-related osteoporosis without current pathological fracture: Secondary | ICD-10-CM | POA: Diagnosis present

## 2022-03-13 DIAGNOSIS — Z803 Family history of malignant neoplasm of breast: Secondary | ICD-10-CM | POA: Diagnosis not present

## 2022-03-13 DIAGNOSIS — Z8049 Family history of malignant neoplasm of other genital organs: Secondary | ICD-10-CM

## 2022-03-13 DIAGNOSIS — K8012 Calculus of gallbladder with acute and chronic cholecystitis without obstruction: Secondary | ICD-10-CM | POA: Diagnosis present

## 2022-03-13 DIAGNOSIS — G8918 Other acute postprocedural pain: Secondary | ICD-10-CM | POA: Diagnosis present

## 2022-03-13 DIAGNOSIS — T8189XA Other complications of procedures, not elsewhere classified, initial encounter: Secondary | ICD-10-CM | POA: Diagnosis present

## 2022-03-13 DIAGNOSIS — D62 Acute posthemorrhagic anemia: Secondary | ICD-10-CM | POA: Diagnosis present

## 2022-03-13 DIAGNOSIS — R1012 Left upper quadrant pain: Secondary | ICD-10-CM | POA: Diagnosis not present

## 2022-03-13 DIAGNOSIS — Z20822 Contact with and (suspected) exposure to covid-19: Secondary | ICD-10-CM | POA: Diagnosis present

## 2022-03-13 DIAGNOSIS — K838 Other specified diseases of biliary tract: Secondary | ICD-10-CM | POA: Diagnosis present

## 2022-03-13 DIAGNOSIS — Z888 Allergy status to other drugs, medicaments and biological substances status: Secondary | ICD-10-CM

## 2022-03-13 DIAGNOSIS — E039 Hypothyroidism, unspecified: Secondary | ICD-10-CM | POA: Diagnosis not present

## 2022-03-13 DIAGNOSIS — Z8249 Family history of ischemic heart disease and other diseases of the circulatory system: Secondary | ICD-10-CM

## 2022-03-13 DIAGNOSIS — M1990 Unspecified osteoarthritis, unspecified site: Secondary | ICD-10-CM | POA: Diagnosis not present

## 2022-03-13 DIAGNOSIS — Z91041 Radiographic dye allergy status: Secondary | ICD-10-CM | POA: Diagnosis not present

## 2022-03-13 DIAGNOSIS — Y838 Other surgical procedures as the cause of abnormal reaction of the patient, or of later complication, without mention of misadventure at the time of the procedure: Secondary | ICD-10-CM | POA: Diagnosis present

## 2022-03-13 DIAGNOSIS — N170 Acute kidney failure with tubular necrosis: Secondary | ICD-10-CM | POA: Diagnosis not present

## 2022-03-13 DIAGNOSIS — K668 Other specified disorders of peritoneum: Secondary | ICD-10-CM | POA: Diagnosis present

## 2022-03-13 DIAGNOSIS — E7800 Pure hypercholesterolemia, unspecified: Secondary | ICD-10-CM | POA: Diagnosis present

## 2022-03-13 DIAGNOSIS — Z7983 Long term (current) use of bisphosphonates: Secondary | ICD-10-CM

## 2022-03-13 DIAGNOSIS — R579 Shock, unspecified: Secondary | ICD-10-CM | POA: Diagnosis not present

## 2022-03-13 DIAGNOSIS — Z96653 Presence of artificial knee joint, bilateral: Secondary | ICD-10-CM | POA: Diagnosis present

## 2022-03-13 DIAGNOSIS — J9601 Acute respiratory failure with hypoxia: Secondary | ICD-10-CM | POA: Diagnosis not present

## 2022-03-13 DIAGNOSIS — N179 Acute kidney failure, unspecified: Secondary | ICD-10-CM

## 2022-03-13 DIAGNOSIS — Z9049 Acquired absence of other specified parts of digestive tract: Secondary | ICD-10-CM | POA: Diagnosis not present

## 2022-03-13 DIAGNOSIS — K9189 Other postprocedural complications and disorders of digestive system: Secondary | ICD-10-CM | POA: Diagnosis not present

## 2022-03-13 DIAGNOSIS — K810 Acute cholecystitis: Secondary | ICD-10-CM

## 2022-03-13 DIAGNOSIS — Z79899 Other long term (current) drug therapy: Secondary | ICD-10-CM | POA: Diagnosis not present

## 2022-03-13 DIAGNOSIS — Z87442 Personal history of urinary calculi: Secondary | ICD-10-CM

## 2022-03-13 LAB — CBC
HCT: 42.8 % (ref 36.0–46.0)
Hemoglobin: 14.1 g/dL (ref 12.0–15.0)
MCH: 28.1 pg (ref 26.0–34.0)
MCHC: 32.9 g/dL (ref 30.0–36.0)
MCV: 85.3 fL (ref 80.0–100.0)
Platelets: 272 10*3/uL (ref 150–400)
RBC: 5.02 MIL/uL (ref 3.87–5.11)
RDW: 16 % — ABNORMAL HIGH (ref 11.5–15.5)
WBC: 14.3 10*3/uL — ABNORMAL HIGH (ref 4.0–10.5)
nRBC: 0 % (ref 0.0–0.2)

## 2022-03-13 LAB — CREATININE, SERUM
Creatinine, Ser: 0.87 mg/dL (ref 0.44–1.00)
GFR, Estimated: >60 mL/min (ref >60)

## 2022-03-13 LAB — SURGICAL PATHOLOGY

## 2022-03-13 MED ORDER — PANTOPRAZOLE SODIUM 40 MG IV SOLR
40.0000 mg | Freq: Every day | INTRAVENOUS | Status: DC
  Administered 2022-03-13: 40 mg via INTRAVENOUS
  Filled 2022-03-13: qty 10

## 2022-03-13 MED ORDER — METHOCARBAMOL 1000 MG/10ML IJ SOLN: 500.0000 mg | Freq: Three times a day (TID) | INTRAVENOUS | Status: DC | PRN

## 2022-03-13 MED ORDER — METHOCARBAMOL 500 MG PO TABS
500.0000 mg | ORAL_TABLET | Freq: Three times a day (TID) | ORAL | Status: DC | PRN
Start: 2022-03-13 — End: 2022-03-14
  Administered 2022-03-14: 500 mg via ORAL
  Filled 2022-03-13: qty 1

## 2022-03-13 MED ORDER — HYDROMORPHONE HCL 1 MG/ML IJ SOLN
0.5000 mg | INTRAMUSCULAR | Status: DC | PRN
  Administered 2022-03-13 – 2022-03-14 (×3): 0.5 mg via INTRAVENOUS
  Filled 2022-03-13 (×3): qty 0.5

## 2022-03-13 MED ORDER — SODIUM CHLORIDE 0.9 % IV SOLN: 4.0000 mg | Freq: Four times a day (QID) | INTRAVENOUS | Status: DC | PRN

## 2022-03-13 MED ORDER — ENOXAPARIN SODIUM 150 MG/ML IJ SOSY: 30.0000 mg | PREFILLED_SYRINGE | INTRAMUSCULAR | Status: DC

## 2022-03-13 MED ORDER — ONDANSETRON 4 MG PO TBDP: 4.0000 mg | ORAL_TABLET | Freq: Four times a day (QID) | ORAL | Status: DC | PRN

## 2022-03-13 MED ORDER — MORPHINE SULFATE (PF) 4 MG/ML IV SOLN: 1.0000 mg | INTRAVENOUS | Status: DC | PRN

## 2022-03-13 MED ORDER — SIMETHICONE 80 MG PO CHEW: 40.0000 mg | CHEWABLE_TABLET | Freq: Four times a day (QID) | ORAL | Status: DC | PRN

## 2022-03-13 MED ORDER — METHOCARBAMOL 500 MG PO TABS: 500.0000 mg | ORAL_TABLET | Freq: Three times a day (TID) | ORAL | Status: DC | PRN

## 2022-03-13 MED ORDER — ENOXAPARIN SODIUM 40 MG/0.4ML IJ SOSY
40.0000 mg | PREFILLED_SYRINGE | INTRAMUSCULAR | Status: DC
  Administered 2022-03-13: 40 mg via SUBCUTANEOUS
  Filled 2022-03-13: qty 0.4

## 2022-03-13 MED ORDER — PROCHLORPERAZINE EDISYLATE 10 MG/2ML IJ SOLN: 5.0000 mg | Freq: Four times a day (QID) | INTRAMUSCULAR | Status: DC | PRN

## 2022-03-13 MED ORDER — OXYCODONE HCL 5 MG PO TABS: 5.0000 mg | ORAL_TABLET | ORAL | Status: DC | PRN

## 2022-03-13 MED ORDER — ACETAMINOPHEN 500 MG PO TABS
1000.0000 mg | ORAL_TABLET | Freq: Four times a day (QID) | ORAL | Status: DC
  Administered 2022-03-13 – 2022-03-16 (×10): 1000 mg via ORAL
  Filled 2022-03-13 (×12): qty 2

## 2022-03-13 MED ORDER — SODIUM CHLORIDE 0.9 % IV SOLN: INTRAVENOUS | Status: DC

## 2022-03-13 MED ORDER — PANTOPRAZOLE SODIUM 40 MG IV SOLR: 40.0000 mg | Freq: Every day | INTRAVENOUS | Status: DC

## 2022-03-13 MED ORDER — OXYCODONE HCL 5 MG PO TABS
5.0000 mg | ORAL_TABLET | ORAL | Status: DC | PRN
  Administered 2022-03-13: 10 mg via ORAL
  Administered 2022-03-14: 5 mg via ORAL
  Filled 2022-03-13: qty 1
  Filled 2022-03-13 (×2): qty 2

## 2022-03-13 MED ORDER — PROCHLORPERAZINE MALEATE 10 MG PO TABS: 10.0000 mg | ORAL_TABLET | Freq: Four times a day (QID) | ORAL | Status: DC | PRN

## 2022-03-13 MED ORDER — ONDANSETRON HCL 4 MG/2ML IJ SOLN: 4.0000 mg | Freq: Four times a day (QID) | INTRAMUSCULAR | Status: DC | PRN

## 2022-03-14 ENCOUNTER — Encounter (HOSPITAL_COMMUNITY): Payer: Self-pay | Admitting: General Surgery

## 2022-03-14 ENCOUNTER — Inpatient Hospital Stay (HOSPITAL_COMMUNITY): Payer: Medicare Other

## 2022-03-14 ENCOUNTER — Encounter (HOSPITAL_COMMUNITY)
Admission: AD | Disposition: A | Discharge status: Home / Self Care | Payer: Self-pay | Source: Ambulatory Visit | Attending: General Surgery

## 2022-03-14 ENCOUNTER — Other Ambulatory Visit: Payer: Self-pay

## 2022-03-14 ENCOUNTER — Inpatient Hospital Stay (HOSPITAL_COMMUNITY): Payer: Medicare Other | Admitting: Anesthesiology

## 2022-03-14 DIAGNOSIS — K9189 Other postprocedural complications and disorders of digestive system: Secondary | ICD-10-CM

## 2022-03-14 DIAGNOSIS — K838 Other specified diseases of biliary tract: Secondary | ICD-10-CM | POA: Diagnosis not present

## 2022-03-14 DIAGNOSIS — Z9049 Acquired absence of other specified parts of digestive tract: Secondary | ICD-10-CM

## 2022-03-14 DIAGNOSIS — E039 Hypothyroidism, unspecified: Secondary | ICD-10-CM

## 2022-03-14 DIAGNOSIS — K668 Other specified disorders of peritoneum: Secondary | ICD-10-CM | POA: Diagnosis present

## 2022-03-14 DIAGNOSIS — G8918 Other acute postprocedural pain: Secondary | ICD-10-CM

## 2022-03-14 DIAGNOSIS — I2510 Atherosclerotic heart disease of native coronary artery without angina pectoris: Secondary | ICD-10-CM

## 2022-03-14 DIAGNOSIS — M1990 Unspecified osteoarthritis, unspecified site: Secondary | ICD-10-CM

## 2022-03-14 DIAGNOSIS — R1012 Left upper quadrant pain: Secondary | ICD-10-CM | POA: Diagnosis not present

## 2022-03-14 DIAGNOSIS — R0689 Other abnormalities of breathing: Secondary | ICD-10-CM

## 2022-03-14 DIAGNOSIS — N179 Acute kidney failure, unspecified: Secondary | ICD-10-CM

## 2022-03-14 DIAGNOSIS — T8189XA Other complications of procedures, not elsewhere classified, initial encounter: Secondary | ICD-10-CM | POA: Diagnosis present

## 2022-03-14 HISTORY — PX: LAPAROSCOPY: SHX197

## 2022-03-14 LAB — URINALYSIS, ROUTINE W REFLEX MICROSCOPIC
Bilirubin Urine: NEGATIVE
Glucose, UA: NEGATIVE mg/dL
Hgb urine dipstick: NEGATIVE
Ketones, ur: 20 mg/dL — AB
Leukocytes,Ua: NEGATIVE
Nitrite: NEGATIVE
Protein, ur: 100 mg/dL — AB
Specific Gravity, Urine: 1.025 (ref 1.005–1.030)
pH: 5 (ref 5.0–8.0)

## 2022-03-14 LAB — CBC WITH DIFFERENTIAL/PLATELET
Abs Immature Granulocytes: 0.07 10*3/uL (ref 0.00–0.07)
Abs Immature Granulocytes: 0.06 10*3/uL (ref 0.00–0.07)
Basophils Absolute: 0 10*3/uL (ref 0.0–0.1)
Basophils Absolute: 0.1 10*3/uL (ref 0.0–0.1)
Basophils Relative: 0 %
Basophils Relative: 0 %
Eosinophils Absolute: 0 10*3/uL (ref 0.0–0.5)
Eosinophils Absolute: 0.1 10*3/uL (ref 0.0–0.5)
Eosinophils Relative: 0 %
Eosinophils Relative: 0 %
HCT: 44.6 % (ref 36.0–46.0)
HCT: 46.4 % — ABNORMAL HIGH (ref 36.0–46.0)
Hemoglobin: 14.5 g/dL (ref 12.0–15.0)
Hemoglobin: 15 g/dL (ref 12.0–15.0)
Immature Granulocytes: 0 %
Immature Granulocytes: 0 %
Lymphocytes Relative: 6 %
Lymphocytes Relative: 6 %
Lymphs Abs: 1.2 10*3/uL (ref 0.7–4.0)
Lymphs Abs: 1.1 10*3/uL (ref 0.7–4.0)
MCH: 28 pg (ref 26.0–34.0)
MCH: 28.2 pg (ref 26.0–34.0)
MCHC: 32.5 g/dL (ref 30.0–36.0)
MCHC: 32.3 g/dL (ref 30.0–36.0)
MCV: 86.1 fL (ref 80.0–100.0)
MCV: 87.4 fL (ref 80.0–100.0)
Monocytes Absolute: 1.6 10*3/uL — ABNORMAL HIGH (ref 0.1–1.0)
Monocytes Absolute: 1.1 10*3/uL — ABNORMAL HIGH (ref 0.1–1.0)
Monocytes Relative: 8 %
Monocytes Relative: 6 %
Neutro Abs: 17.2 10*3/uL — ABNORMAL HIGH (ref 1.7–7.7)
Neutro Abs: 15.6 10*3/uL — ABNORMAL HIGH (ref 1.7–7.7)
Neutrophils Relative %: 86 %
Neutrophils Relative %: 88 %
Platelets: 303 10*3/uL (ref 150–400)
Platelets: 318 10*3/uL (ref 150–400)
RBC: 5.18 MIL/uL — ABNORMAL HIGH (ref 3.87–5.11)
RBC: 5.31 MIL/uL — ABNORMAL HIGH (ref 3.87–5.11)
RDW: 16.2 % — ABNORMAL HIGH (ref 11.5–15.5)
RDW: 16.2 % — ABNORMAL HIGH (ref 11.5–15.5)
WBC: 20 10*3/uL — ABNORMAL HIGH (ref 4.0–10.5)
WBC: 18.1 10*3/uL — ABNORMAL HIGH (ref 4.0–10.5)
nRBC: 0 % (ref 0.0–0.2)
nRBC: 0 % (ref 0.0–0.2)

## 2022-03-14 LAB — CBC
HCT: 44.5 % (ref 36.0–46.0)
Hemoglobin: 13.6 g/dL (ref 12.0–15.0)
MCH: 27.9 pg (ref 26.0–34.0)
MCHC: 30.6 g/dL (ref 30.0–36.0)
MCV: 91.4 fL (ref 80.0–100.0)
Platelets: 251 10*3/uL (ref 150–400)
RBC: 4.87 MIL/uL (ref 3.87–5.11)
RDW: 16.2 % — ABNORMAL HIGH (ref 11.5–15.5)
WBC: 3.9 10*3/uL — ABNORMAL LOW (ref 4.0–10.5)
nRBC: 0 % (ref 0.0–0.2)

## 2022-03-14 LAB — COMPREHENSIVE METABOLIC PANEL
ALT: 20 U/L (ref 0–44)
ALT: 17 U/L (ref 0–44)
AST: 20 U/L (ref 15–41)
AST: 26 U/L (ref 15–41)
Albumin: 3.1 g/dL — ABNORMAL LOW (ref 3.5–5.0)
Albumin: 2.9 g/dL — ABNORMAL LOW (ref 3.5–5.0)
Alkaline Phosphatase: 69 U/L (ref 38–126)
Alkaline Phosphatase: 40 U/L (ref 38–126)
Anion gap: 15 (ref 5–15)
Anion gap: 15 (ref 5–15)
BUN: 26 mg/dL — ABNORMAL HIGH (ref 8–23)
BUN: 30 mg/dL — ABNORMAL HIGH (ref 8–23)
CO2: 22 mmol/L (ref 22–32)
CO2: 18 mmol/L — ABNORMAL LOW (ref 22–32)
Calcium: 9.2 mg/dL (ref 8.9–10.3)
Calcium: 8.4 mg/dL — ABNORMAL LOW (ref 8.9–10.3)
Chloride: 102 mmol/L (ref 98–111)
Chloride: 107 mmol/L (ref 98–111)
Creatinine, Ser: 2.44 mg/dL — ABNORMAL HIGH (ref 0.44–1.00)
Creatinine, Ser: 2.4 mg/dL — ABNORMAL HIGH (ref 0.44–1.00)
GFR, Estimated: 20 mL/min — ABNORMAL LOW (ref >60)
GFR, Estimated: 21 mL/min — ABNORMAL LOW (ref >60)
Glucose, Bld: 132 mg/dL — ABNORMAL HIGH (ref 70–99)
Glucose, Bld: 101 mg/dL — ABNORMAL HIGH (ref 70–99)
Potassium: 5 mmol/L (ref 3.5–5.1)
Potassium: 3.7 mmol/L (ref 3.5–5.1)
Sodium: 139 mmol/L (ref 135–145)
Sodium: 140 mmol/L (ref 135–145)
Total Bilirubin: 2 mg/dL — ABNORMAL HIGH (ref 0.3–1.2)
Total Bilirubin: 1.4 mg/dL — ABNORMAL HIGH (ref 0.3–1.2)
Total Protein: 7.3 g/dL (ref 6.5–8.1)
Total Protein: 5.8 g/dL — ABNORMAL LOW (ref 6.5–8.1)

## 2022-03-14 LAB — TYPE AND SCREEN
ABO/RH(D): A POS
Antibody Screen: NEGATIVE

## 2022-03-14 LAB — MRSA NEXT GEN BY PCR, NASAL: MRSA by PCR Next Gen: NOT DETECTED

## 2022-03-14 LAB — RESP PANEL BY RT-PCR (FLU A&B, COVID) ARPGX2
Influenza A by PCR: NEGATIVE
Influenza B by PCR: NEGATIVE
SARS Coronavirus 2 by RT PCR: NEGATIVE

## 2022-03-14 LAB — GLUCOSE, CAPILLARY: Glucose-Capillary: 160 mg/dL — ABNORMAL HIGH (ref 70–99)

## 2022-03-14 LAB — MAGNESIUM: Magnesium: 2.2 mg/dL (ref 1.7–2.4)

## 2022-03-14 LAB — BILIRUBIN, TOTAL: Total Bilirubin: 1.7 mg/dL — ABNORMAL HIGH (ref 0.3–1.2)

## 2022-03-14 LAB — TROPONIN I (HIGH SENSITIVITY): Troponin I (High Sensitivity): 9 ng/L (ref <18)

## 2022-03-14 LAB — LACTIC ACID, PLASMA
Lactic Acid, Venous: 7.3 mmol/L (ref 0.5–1.9)
Lactic Acid, Venous: 3.8 mmol/L (ref 0.5–1.9)

## 2022-03-14 LAB — LIPASE, BLOOD: Lipase: 29 U/L (ref 11–51)

## 2022-03-14 IMAGING — DX DG CHEST 1V PORT
1 series · 1 of 1 positions shown · non-contrast
Comparison: Radiograph [DATE].

CLINICAL DATA: Hypoxia.

EXAM:
PORTABLE CHEST 1 VIEW

[chest ap]
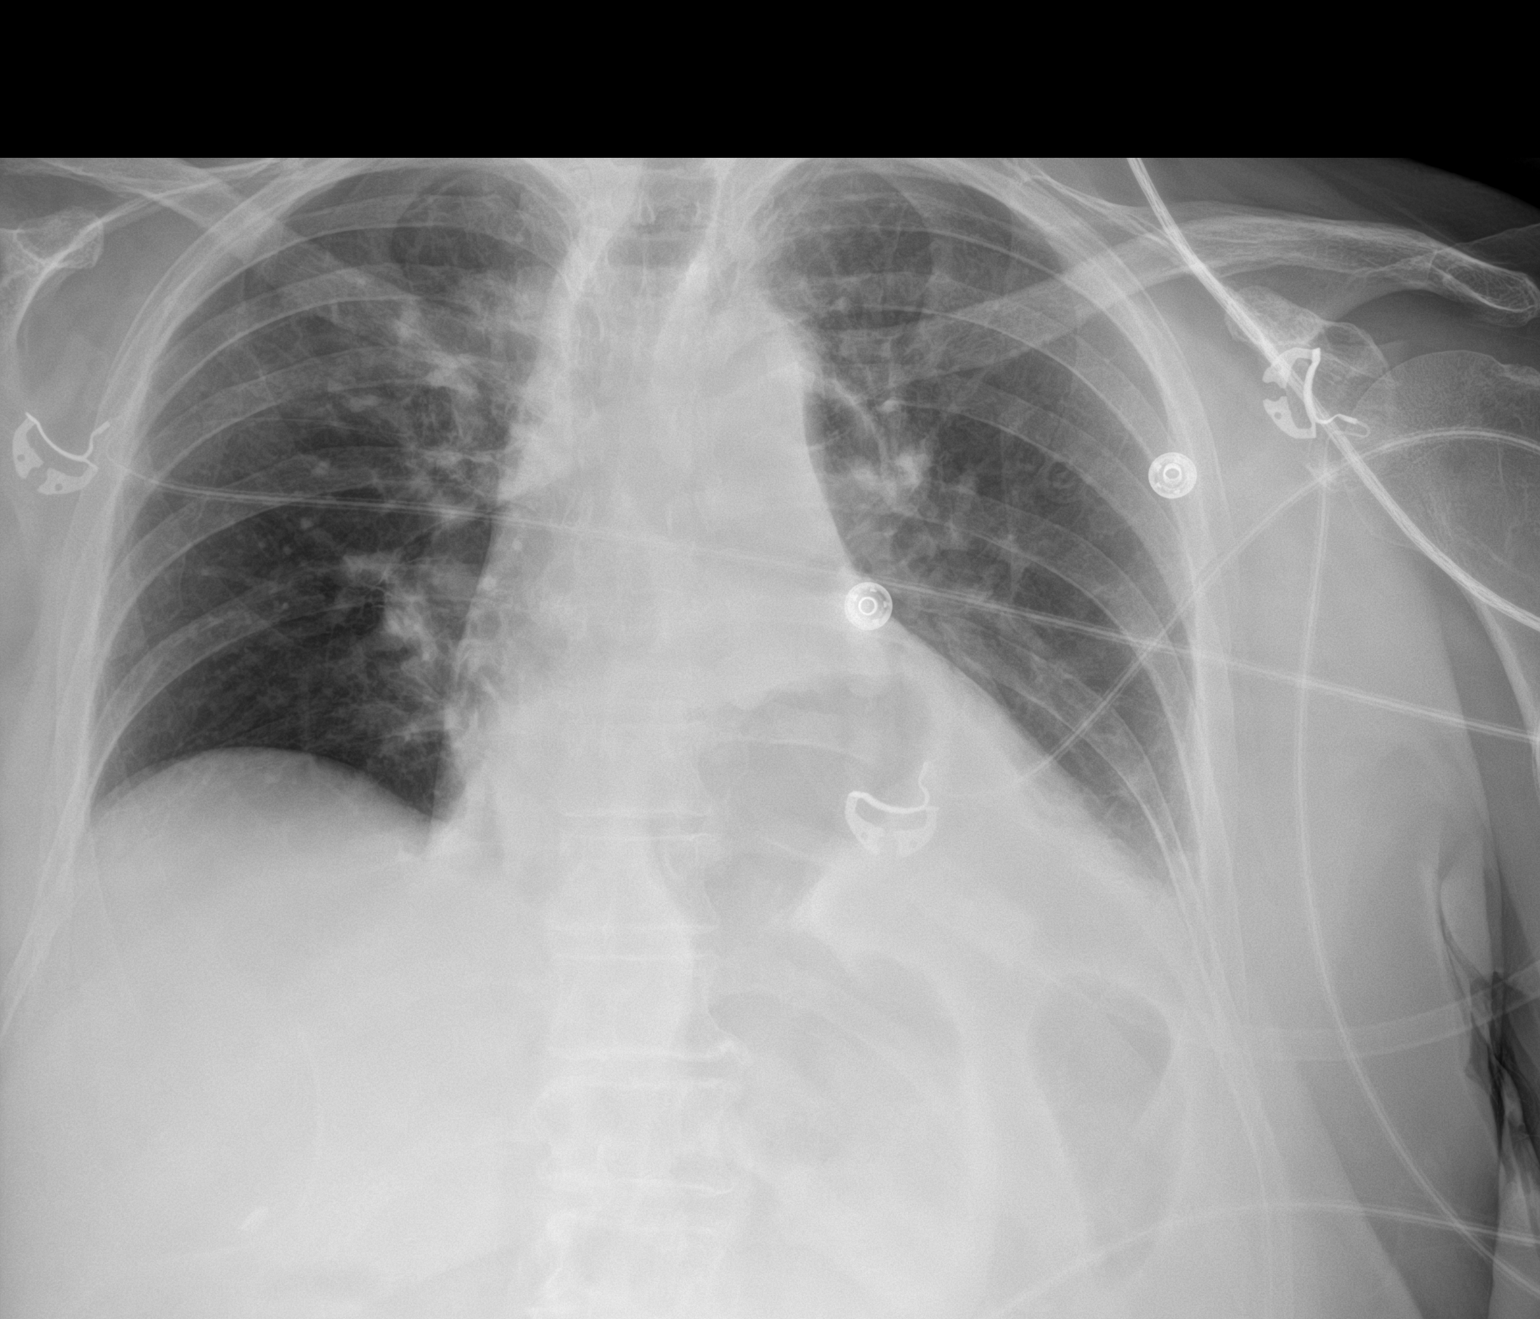

[1 of 1 positions shown; findings below may reference images not displayed]

FINDINGS: Enlarged cardiac silhouette with central vascular prominence.
Moderate hiatal hernia. Low lung volumes with bibasilar atelectasis.
Trace left pleural effusion. The visualized skeletal structures are
unchanged.
IMPRESSION: Low lung volumes with bibasilar atelectasis and a trace left pleural
effusion.

## 2022-03-14 IMAGING — CT CT ABD-PELV W/O CM
2 of 4 series · 15 of 46 positions shown, 17 images · non-contrast
Comparison: [DATE]

CLINICAL DATA: Laparoscopic cholecystectomy 4 days ago.  Pain.



[Series 3: abd/ pelvis 5.0 i30f 2 · axial · 0.84mm/px · z∈[+868,+1298]mm · 12 of 98 slices shown, 14 images]
[im 8/98  soft-tissue]
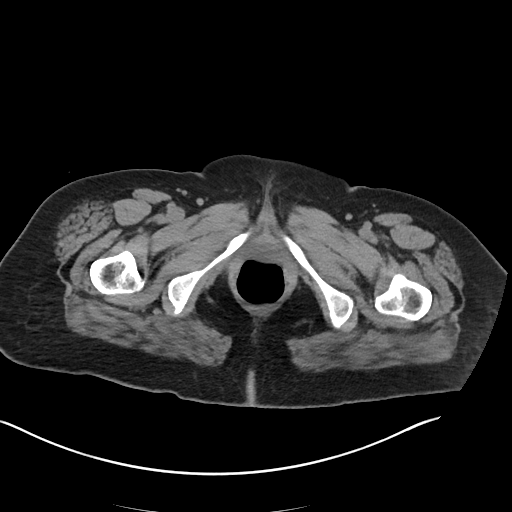
[im 8/98  bone]
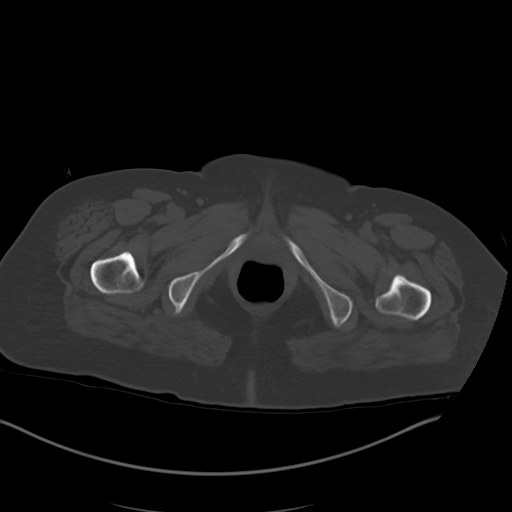
[im 16/98  soft-tissue]
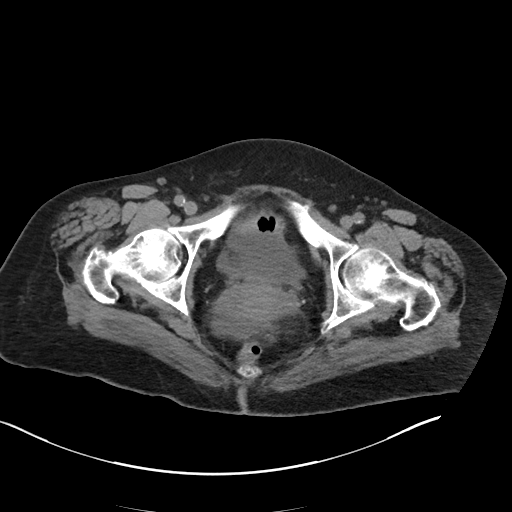
[im 24/98  soft-tissue]
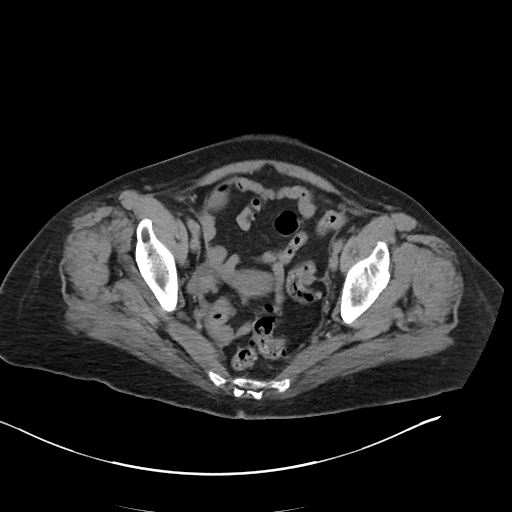
[im 32/98  soft-tissue]
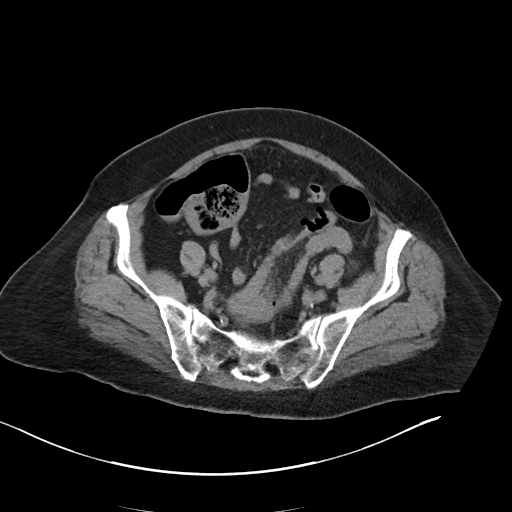
[im 39/98  soft-tissue]
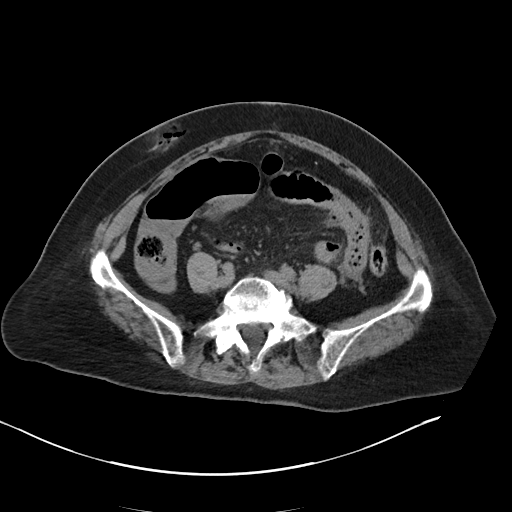
[im 47/98  soft-tissue]
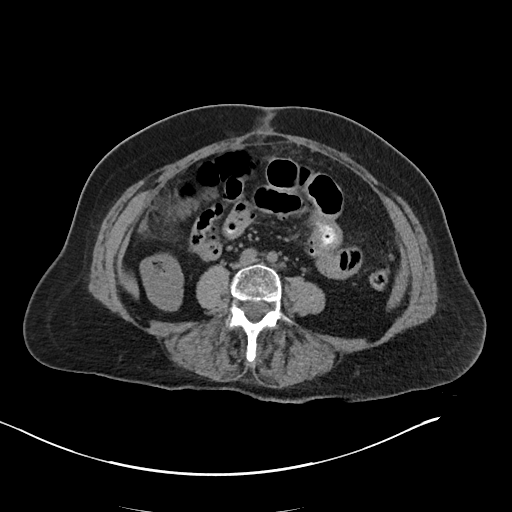
[im 55/98  soft-tissue]
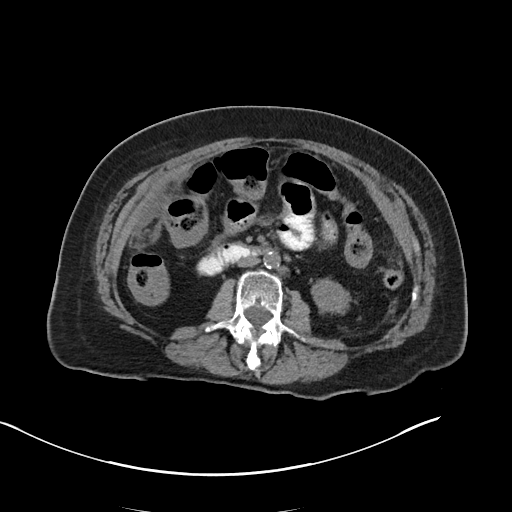
[im 63/98  soft-tissue]
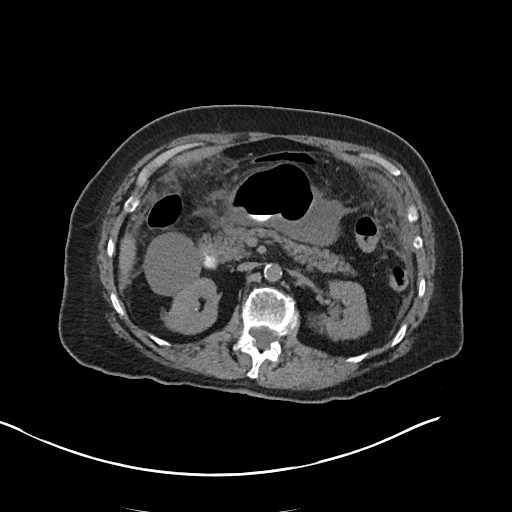
[im 70/98  soft-tissue]
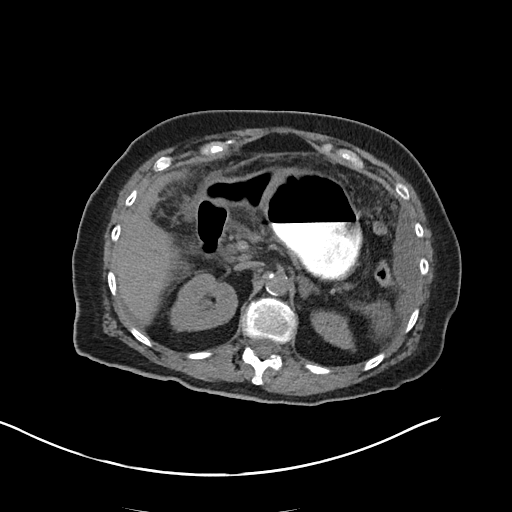
[im 70/98  bone]
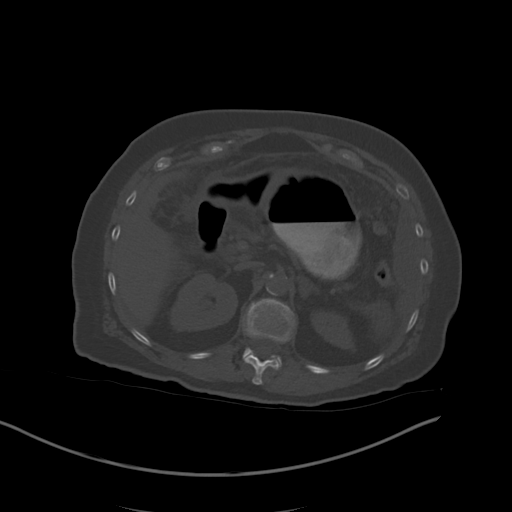
[im 78/98  soft-tissue]
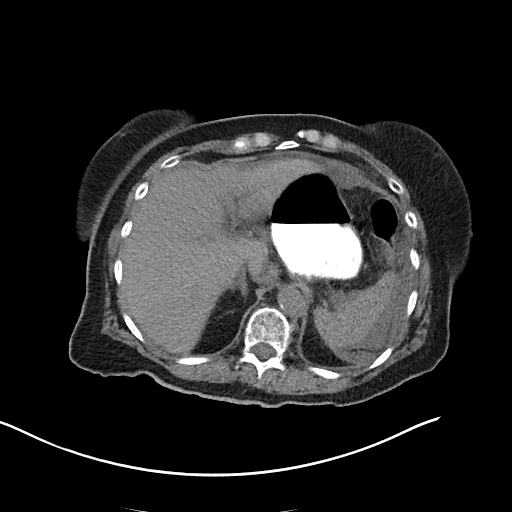
[im 86/98  soft-tissue]
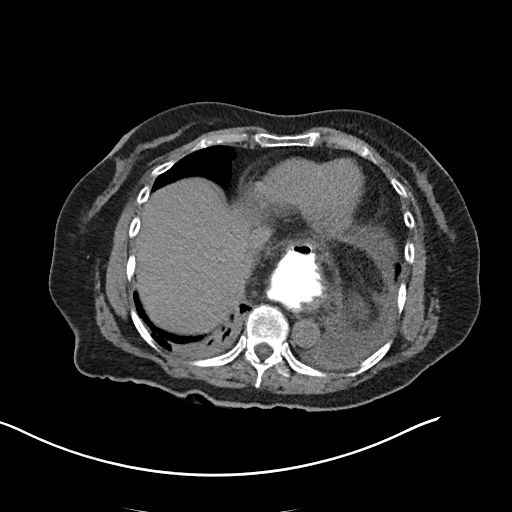
[im 94/98  soft-tissue]
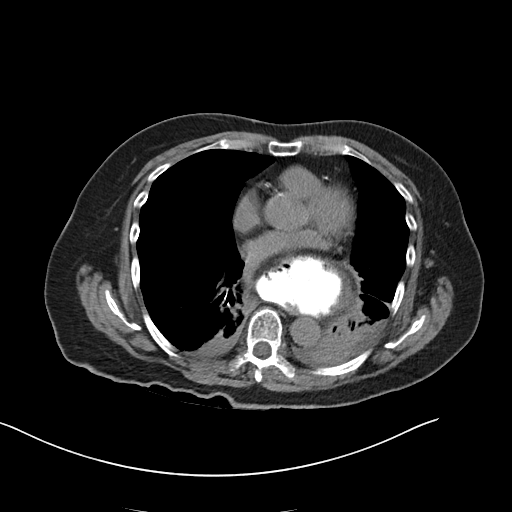

[Series 6: cor st · coronal · 0.82mm/px · 3 of 96 slices shown]
[im 32/96  soft-tissue]
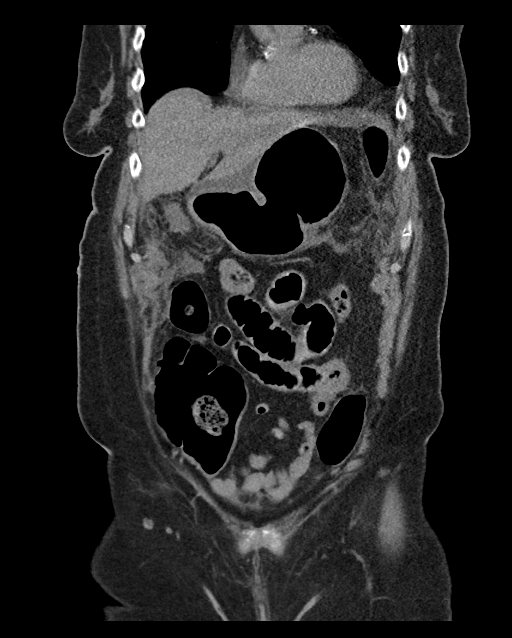
[im 43/96  soft-tissue]
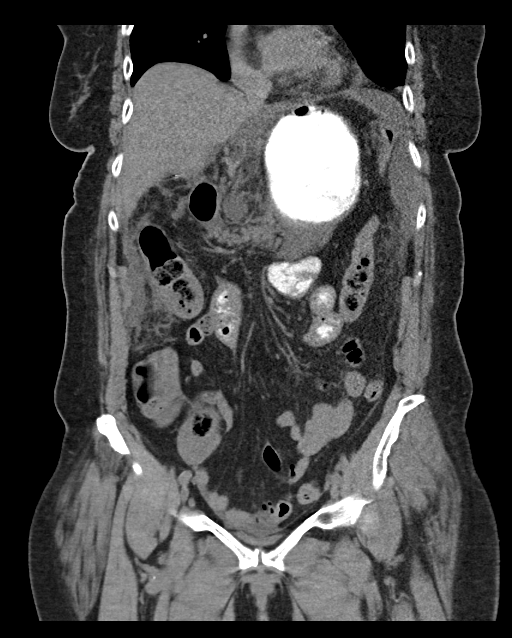
[im 53/96  soft-tissue]
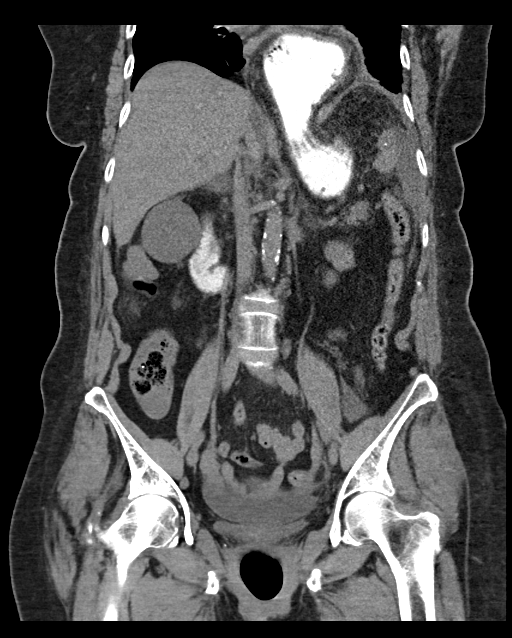

[15 of 46 positions shown; findings below may reference images not displayed]

FINDINGS: Lower chest: Left greater than right base atelectasis, increased.
Normal heart size with multivessel coronary artery calcification.
New trace left pleural fluid. Moderate hiatal hernia.

Hepatobiliary: Normal liver. Cholecystectomy. No biliary duct
dilatation or calcified common duct stone.

Pancreas: Normal, without mass or ductal dilatation.

Spleen: Old granulomatous disease within.

Adrenals/Urinary Tract: Mild left adrenal thickening. Normal right
adrenal gland. Punctate bilateral renal collecting system calculi.
Interpolar right renal exophytic anterior 4.4 cm low-density lesion
is likely a cyst or minimally complex cyst . In the absence of
clinically indicated signs/symptoms require(s) no independent
follow-up. No hydroureter or ureteric calculi. No bladder calculi.

Stomach/Bowel: Normal remainder of the stomach. Scattered colonic
diverticula. Normal terminal ileum and appendix. Normal small bowel.

Vascular/Lymphatic: Aortic atherosclerosis. No abdominopelvic
adenopathy.

Reproductive: Normal uterus. Right ovarian 1.8 cm fluid density
lesion is consistent with a residual follicle or cyst.

Other: Small volume pelvic fluid, slightly increased on 82/3.

Small volume loculated ascites is identified within the left
perihepatic space and extending into the left upper quadrant.
Example [DATE]. New. Contiguous increased perisplenic fluid including
on [DATE], increased. Small volume fluid is also identified within
either the alesser sac or gastrosplenic ligament including at 3.8 x
4.6 cm on 37/3, new. Omental edema is similar with minimal new
ascites within the right lateral abdomen on [DATE].

No free intraperitoneal air. Anterior abdominal wall subcutaneous
gas is likely postoperative, small volume.

Musculoskeletal: No acute osseous abnormality.
IMPRESSION: 1. Since the immediate postoperative CT of [DATE], increase in
left-greater-than-right upper abdominal fluid with areas of
loculation including in the left perihepatic space. Considerations
include a somewhat atypical distribution of delayed bile leak versus
small volume postoperative hemorrhage. Consider sampling or nuclear
medicine HIDA scan.
2. No other explanation for pain.
3. New trace left pleural fluid with progressive bibasilar
atelectasis.
4. Moderate hiatal hernia.
5. Bilateral nephrolithiasis
6. Coronary artery atherosclerosis. Aortic Atherosclerosis
([VT]-[VT]).

## 2022-03-14 SURGERY — LAPAROSCOPY, DIAGNOSTIC
Anesthesia: General | Site: Abdomen

## 2022-03-14 MED ORDER — ALBUMIN HUMAN 5 % IV SOLN: INTRAVENOUS | Status: DC | PRN

## 2022-03-14 MED ORDER — SODIUM CHLORIDE 0.9 % IV SOLN: INTRAVENOUS | Status: DC

## 2022-03-14 MED ORDER — MORPHINE SULFATE 1 MG/ML IV SOLN PCA
INTRAVENOUS | Status: DC
  Filled 2022-03-14: qty 30

## 2022-03-14 MED ORDER — PHENYLEPHRINE 80 MCG/ML (10ML) SYRINGE FOR IV PUSH (FOR BLOOD PRESSURE SUPPORT)
PREFILLED_SYRINGE | INTRAVENOUS | Status: DC | PRN
  Administered 2022-03-14: 240 ug via INTRAVENOUS
  Administered 2022-03-14: 160 ug via INTRAVENOUS

## 2022-03-14 MED ORDER — SODIUM CHLORIDE 0.9 % IV SOLN
2.0000 g | INTRAVENOUS | Status: DC
  Administered 2022-03-15 – 2022-03-17 (×3): 2 g via INTRAVENOUS
  Filled 2022-03-14 (×4): qty 20

## 2022-03-14 MED ORDER — ENOXAPARIN SODIUM 30 MG/0.3ML IJ SOSY
30.0000 mg | PREFILLED_SYRINGE | INTRAMUSCULAR | Status: DC
  Administered 2022-03-14 – 2022-03-15 (×2): 30 mg via SUBCUTANEOUS
  Filled 2022-03-14 (×2): qty 0.3

## 2022-03-14 MED ORDER — BUPIVACAINE-EPINEPHRINE (PF) 0.25% -1:200000 IJ SOLN
INTRAMUSCULAR | Status: AC
  Filled 2022-03-14: qty 30

## 2022-03-14 MED ORDER — DIPHENHYDRAMINE HCL 50 MG/ML IJ SOLN: 12.5000 mg | Freq: Four times a day (QID) | INTRAMUSCULAR | Status: DC | PRN

## 2022-03-14 MED ORDER — PROPOFOL 10 MG/ML IV BOLUS
INTRAVENOUS | Status: AC
  Filled 2022-03-14: qty 20

## 2022-03-14 MED ORDER — SODIUM CHLORIDE 0.9% FLUSH: 9.0000 mL | INTRAVENOUS | Status: DC | PRN

## 2022-03-14 MED ORDER — MORPHINE SULFATE (PF) 2 MG/ML IV SOLN
1.0000 mg | INTRAVENOUS | Status: DC | PRN
  Administered 2022-03-14 – 2022-03-15 (×3): 1 mg via INTRAVENOUS
  Filled 2022-03-14 (×3): qty 1

## 2022-03-14 MED ORDER — SUCCINYLCHOLINE CHLORIDE 200 MG/10ML IV SOSY
PREFILLED_SYRINGE | INTRAVENOUS | Status: AC
  Filled 2022-03-14: qty 10

## 2022-03-14 MED ORDER — SODIUM CHLORIDE 0.9 % IV SOLN
2.0000 g | INTRAVENOUS | Status: DC
  Administered 2022-03-14: 2 g via INTRAVENOUS
  Filled 2022-03-14: qty 20

## 2022-03-14 MED ORDER — 0.9 % SODIUM CHLORIDE (POUR BTL) OPTIME
TOPICAL | Status: DC | PRN
  Administered 2022-03-14: 1000 mL

## 2022-03-14 MED ORDER — AMISULPRIDE (ANTIEMETIC) 5 MG/2ML IV SOLN: 10.0000 mg | Freq: Once | INTRAVENOUS | Status: DC | PRN

## 2022-03-14 MED ORDER — PHENYLEPHRINE HCL (PRESSORS) 10 MG/ML IV SOLN
INTRAVENOUS | Status: AC
  Filled 2022-03-14: qty 1

## 2022-03-14 MED ORDER — ALBUMIN HUMAN 5 % IV SOLN
12.5000 g | Freq: Once | INTRAVENOUS | Status: AC
  Administered 2022-03-14: 12.5 g via INTRAVENOUS
  Filled 2022-03-14: qty 250

## 2022-03-14 MED ORDER — ROCURONIUM BROMIDE 10 MG/ML (PF) SYRINGE
PREFILLED_SYRINGE | INTRAVENOUS | Status: AC
  Filled 2022-03-14: qty 10

## 2022-03-14 MED ORDER — CHLORHEXIDINE GLUCONATE 0.12 % MT SOLN: 15.0000 mL | Freq: Once | OROMUCOSAL | Status: DC

## 2022-03-14 MED ORDER — PHENYLEPHRINE 80 MCG/ML (10ML) SYRINGE FOR IV PUSH (FOR BLOOD PRESSURE SUPPORT)
PREFILLED_SYRINGE | INTRAVENOUS | Status: AC
  Filled 2022-03-14: qty 10

## 2022-03-14 MED ORDER — MIDAZOLAM HCL 2 MG/2ML IJ SOLN
INTRAMUSCULAR | Status: AC
  Filled 2022-03-14: qty 2

## 2022-03-14 MED ORDER — LIDOCAINE 2% (20 MG/ML) 5 ML SYRINGE
INTRAMUSCULAR | Status: DC | PRN
  Administered 2022-03-14: 60 mg via INTRAVENOUS

## 2022-03-14 MED ORDER — OXYCODONE HCL 5 MG PO TABS: 5.0000 mg | ORAL_TABLET | Freq: Once | ORAL | Status: DC | PRN

## 2022-03-14 MED ORDER — OXYCODONE HCL 5 MG/5ML PO SOLN: 5.0000 mg | Freq: Once | ORAL | Status: DC | PRN

## 2022-03-14 MED ORDER — VASOPRESSIN 20 UNIT/ML IV SOLN
INTRAVENOUS | Status: AC
  Filled 2022-03-14: qty 1

## 2022-03-14 MED ORDER — MORPHINE SULFATE (PF) 2 MG/ML IV SOLN: 1.0000 mg | INTRAVENOUS | Status: DC | PRN

## 2022-03-14 MED ORDER — SODIUM CHLORIDE 0.9 % IV BOLUS
500.0000 mL | Freq: Once | INTRAVENOUS | Status: AC
  Administered 2022-03-14: 500 mL via INTRAVENOUS

## 2022-03-14 MED ORDER — FENTANYL CITRATE (PF) 250 MCG/5ML IJ SOLN
INTRAMUSCULAR | Status: DC | PRN
  Administered 2022-03-14 (×3): 50 ug via INTRAVENOUS

## 2022-03-14 MED ORDER — HYDROMORPHONE HCL 1 MG/ML IJ SOLN: 0.2500 mg | INTRAMUSCULAR | Status: DC | PRN

## 2022-03-14 MED ORDER — DIPHENHYDRAMINE HCL 12.5 MG/5ML PO ELIX: 12.5000 mg | ORAL_SOLUTION | Freq: Four times a day (QID) | ORAL | Status: DC | PRN

## 2022-03-14 MED ORDER — BUPIVACAINE-EPINEPHRINE (PF) 0.25% -1:200000 IJ SOLN
INTRAMUSCULAR | Status: DC | PRN
  Administered 2022-03-14: 5.5 mL via PERINEURAL

## 2022-03-14 MED ORDER — LACTATED RINGERS IV SOLN: INTRAVENOUS | Status: DC | PRN

## 2022-03-14 MED ORDER — FENTANYL CITRATE (PF) 250 MCG/5ML IJ SOLN
INTRAMUSCULAR | Status: AC
  Filled 2022-03-14: qty 5

## 2022-03-14 MED ORDER — PANTOPRAZOLE SODIUM 40 MG IV SOLR
40.0000 mg | Freq: Every day | INTRAVENOUS | Status: DC
  Administered 2022-03-14 – 2022-03-16 (×3): 40 mg via INTRAVENOUS
  Filled 2022-03-14 (×3): qty 10

## 2022-03-14 MED ORDER — ENOXAPARIN SODIUM 30 MG/0.3ML IJ SOSY: 30.0000 mg | PREFILLED_SYRINGE | INTRAMUSCULAR | Status: DC

## 2022-03-14 MED ORDER — BARIUM SULFATE 2 % PO SUSP
ORAL | Status: AC
  Filled 2022-03-14: qty 2

## 2022-03-14 MED ORDER — METRONIDAZOLE 500 MG/100ML IV SOLN
500.0000 mg | Freq: Two times a day (BID) | INTRAVENOUS | Status: DC
  Administered 2022-03-14: 500 mg via INTRAVENOUS
  Filled 2022-03-14: qty 100

## 2022-03-14 MED ORDER — SODIUM CHLORIDE (PF) 0.9 % IJ SOLN
INTRAMUSCULAR | Status: AC
  Filled 2022-03-14: qty 20

## 2022-03-14 MED ORDER — SUGAMMADEX SODIUM 200 MG/2ML IV SOLN
INTRAVENOUS | Status: DC | PRN
  Administered 2022-03-14: 200 mg via INTRAVENOUS

## 2022-03-14 MED ORDER — LIDOCAINE 2% (20 MG/ML) 5 ML SYRINGE
INTRAMUSCULAR | Status: AC
  Filled 2022-03-14: qty 5

## 2022-03-14 MED ORDER — ONDANSETRON 4 MG PO TBDP: 4.0000 mg | ORAL_TABLET | Freq: Four times a day (QID) | ORAL | Status: DC | PRN

## 2022-03-14 MED ORDER — CHLORHEXIDINE GLUCONATE CLOTH 2 % EX PADS
6.0000 | MEDICATED_PAD | Freq: Every day | CUTANEOUS | Status: DC
  Administered 2022-03-14 – 2022-03-17 (×4): 6 via TOPICAL

## 2022-03-14 MED ORDER — LEVOTHYROXINE SODIUM 100 MCG PO TABS
100.0000 ug | ORAL_TABLET | Freq: Every day | ORAL | Status: DC
Start: 2022-03-15 — End: 2022-03-18
  Administered 2022-03-15 – 2022-03-18 (×4): 100 ug via ORAL
  Filled 2022-03-14 (×4): qty 1

## 2022-03-14 MED ORDER — MORPHINE SULFATE (PF) 2 MG/ML IV SOLN
1.0000 mg | INTRAVENOUS | Status: DC | PRN
  Administered 2022-03-14 (×2): 2 mg via INTRAVENOUS
  Administered 2022-03-14: 1 mg via INTRAVENOUS
  Filled 2022-03-14 (×3): qty 1

## 2022-03-14 MED ORDER — DEXAMETHASONE SODIUM PHOSPHATE 10 MG/ML IJ SOLN
INTRAMUSCULAR | Status: DC | PRN
  Administered 2022-03-14: 10 mg via INTRAVENOUS

## 2022-03-14 MED ORDER — SUCCINYLCHOLINE CHLORIDE 200 MG/10ML IV SOSY
PREFILLED_SYRINGE | INTRAVENOUS | Status: DC | PRN
  Administered 2022-03-14: 120 mg via INTRAVENOUS

## 2022-03-14 MED ORDER — ONDANSETRON HCL 4 MG/2ML IJ SOLN: 4.0000 mg | Freq: Once | INTRAMUSCULAR | Status: DC | PRN

## 2022-03-14 MED ORDER — ROCURONIUM BROMIDE 10 MG/ML (PF) SYRINGE
PREFILLED_SYRINGE | INTRAVENOUS | Status: DC | PRN
  Administered 2022-03-14: 50 mg via INTRAVENOUS

## 2022-03-14 MED ORDER — ONDANSETRON HCL 4 MG/2ML IJ SOLN
4.0000 mg | Freq: Four times a day (QID) | INTRAMUSCULAR | Status: DC | PRN
  Administered 2022-03-14: 4 mg via INTRAVENOUS
  Filled 2022-03-14: qty 2

## 2022-03-14 MED ORDER — PHENYLEPHRINE HCL-NACL 20-0.9 MG/250ML-% IV SOLN
INTRAVENOUS | Status: DC | PRN
  Administered 2022-03-14: 40 ug/min via INTRAVENOUS

## 2022-03-14 MED ORDER — PROPOFOL 10 MG/ML IV BOLUS
INTRAVENOUS | Status: DC | PRN
  Administered 2022-03-14: 130 mg via INTRAVENOUS

## 2022-03-14 MED ORDER — ONDANSETRON HCL 4 MG/2ML IJ SOLN
INTRAMUSCULAR | Status: DC | PRN
  Administered 2022-03-14: 4 mg via INTRAVENOUS

## 2022-03-14 MED ORDER — ONDANSETRON HCL 4 MG/2ML IJ SOLN: 4.0000 mg | Freq: Four times a day (QID) | INTRAMUSCULAR | Status: DC | PRN

## 2022-03-14 MED ORDER — ACETAMINOPHEN 10 MG/ML IV SOLN
INTRAVENOUS | Status: AC
  Filled 2022-03-14: qty 100

## 2022-03-14 MED ORDER — METRONIDAZOLE 500 MG/100ML IV SOLN
500.0000 mg | Freq: Three times a day (TID) | INTRAVENOUS | Status: DC
  Administered 2022-03-14 – 2022-03-18 (×11): 500 mg via INTRAVENOUS
  Filled 2022-03-14 (×12): qty 100

## 2022-03-14 MED ORDER — NALOXONE HCL 0.4 MG/ML IJ SOLN: 0.4000 mg | INTRAMUSCULAR | Status: DC | PRN

## 2022-03-14 MED ORDER — ORAL CARE MOUTH RINSE: 15.0000 mL | Freq: Once | OROMUCOSAL | Status: DC

## 2022-03-14 SURGICAL SUPPLY — 48 items
BIOPATCH RED 1 DISK 7.0 (GAUZE/BANDAGES/DRESSINGS) ×2 IMPLANT
CANISTER SUCT 3000ML PPV (MISCELLANEOUS) ×3 IMPLANT
CHLORAPREP W/TINT 26 (MISCELLANEOUS) ×3 IMPLANT
COVER SURGICAL LIGHT HANDLE (MISCELLANEOUS) ×3 IMPLANT
DERMABOND ADVANCED (GAUZE/BANDAGES/DRESSINGS) ×1
DERMABOND ADVANCED .7 DNX12 (GAUZE/BANDAGES/DRESSINGS) ×2 IMPLANT
DRAIN CHANNEL 19F RND (DRAIN) ×2 IMPLANT
DRAPE WARM FLUID 44X44 (DRAPES) ×3 IMPLANT
DRSG TEGADERM 4X4.75 (GAUZE/BANDAGES/DRESSINGS) ×2 IMPLANT
ELECT CAUTERY BLADE 6.4 (BLADE) ×3 IMPLANT
ELECT REM PT RETURN 9FT ADLT (ELECTROSURGICAL) ×3
ELECTRODE REM PT RTRN 9FT ADLT (ELECTROSURGICAL) ×2 IMPLANT
EVACUATOR SILICONE 100CC (DRAIN) ×2 IMPLANT
GLOVE BIO SURGEON STRL SZ7.5 (GLOVE) ×3 IMPLANT
GOWN STRL REUS W/ TWL LRG LVL3 (GOWN DISPOSABLE) ×6 IMPLANT
GOWN STRL REUS W/TWL LRG LVL3 (GOWN DISPOSABLE) ×3
HANDLE SUCTION POOLE (INSTRUMENTS) ×2 IMPLANT
IRRIG SUCT STRYKERFLOW 2 WTIP (MISCELLANEOUS) ×3
IRRIGATION SUCT STRKRFLW 2 WTP (MISCELLANEOUS) ×1 IMPLANT
KIT BASIN OR (CUSTOM PROCEDURE TRAY) ×3 IMPLANT
KIT TURNOVER KIT B (KITS) ×3 IMPLANT
NS IRRIG 1000ML POUR BTL (IV SOLUTION) ×4 IMPLANT
PACK GENERAL/GYN (CUSTOM PROCEDURE TRAY) ×3 IMPLANT
PAD ARMBOARD 7.5X6 YLW CONV (MISCELLANEOUS) ×6 IMPLANT
PENCIL SMOKE EVACUATOR (MISCELLANEOUS) ×3 IMPLANT
SET IRRIG TUBING LAPAROSCOPIC (IRRIGATION / IRRIGATOR) IMPLANT
SET TUBE SMOKE EVAC HIGH FLOW (TUBING) ×3 IMPLANT
SLEEVE ENDOPATH XCEL 5M (ENDOMECHANICALS) ×3 IMPLANT
SLEEVE Z-THREAD 5X100MM (TROCAR) ×2 IMPLANT
SPONGE T-LAP 18X18 ~~LOC~~+RFID (SPONGE) ×2 IMPLANT
SUCTION POOLE HANDLE (INSTRUMENTS) ×3
SUT ETHILON 2 0 FS 18 (SUTURE) ×2 IMPLANT
SUT MNCRL AB 4-0 PS2 18 (SUTURE) ×3 IMPLANT
SUT PDS AB 1 TP1 96 (SUTURE) ×2 IMPLANT
SUT SILK 2 0 SH CR/8 (SUTURE) ×3 IMPLANT
SUT SILK 2 0 TIES 10X30 (SUTURE) ×3 IMPLANT
SUT SILK 3 0 SH CR/8 (SUTURE) ×3 IMPLANT
SUT SILK 3 0 TIES 10X30 (SUTURE) ×3 IMPLANT
TOWEL GREEN STERILE (TOWEL DISPOSABLE) ×3 IMPLANT
TOWEL GREEN STERILE FF (TOWEL DISPOSABLE) ×3 IMPLANT
TRAY FOLEY MTR SLVR 16FR STAT (SET/KITS/TRAYS/PACK) ×2 IMPLANT
TRAY LAPAROSCOPIC MC (CUSTOM PROCEDURE TRAY) ×3 IMPLANT
TROCAR ADV FIXATION 5X100MM (TROCAR) ×2 IMPLANT
TROCAR BALLN 12MMX100 BLUNT (TROCAR) ×2 IMPLANT
TROCAR XCEL BLUNT TIP 100MML (ENDOMECHANICALS) IMPLANT
TROCAR XCEL NON-BLD 11X100MML (ENDOMECHANICALS) IMPLANT
TROCAR XCEL NON-BLD 5MMX100MML (ENDOMECHANICALS) ×3 IMPLANT
YANKAUER SUCT BULB TIP NO VENT (SUCTIONS) ×2 IMPLANT

## 2022-03-14 NOTE — Progress Notes (Signed)
RN went into the room and the patient is crying out stating her pain is not getting any better despite getting morphine 2x they asked me to notify MD to see if there is something else told them I would let MD know about her pain. MD secure chatted

## 2022-03-14 NOTE — H&P (Signed)
Ct shows more fluid in the RUQ than it did 2 days ago. I am concerned about a bile leak from cystic duct stump. Since her breathing has gotten so much worse today I will plan to take her back to OR for a diagnostic laparoscopy and possible ex lap depending on the findings. I have discussed with her and the family the risks and benefits of the surgery as well as some of the technical aspects and she understands and wishes to proceed. She understands that she may end up staying on the vent after surgery.

## 2022-03-14 NOTE — Progress Notes (Signed)
46m of morphine wasted per protocol. Witnessed by TShanon Rosser

## 2022-03-14 NOTE — Progress Notes (Signed)
Subjective/Chief Complaint: Complains that abd is getting worse   Objective: Vital signs in last 24 hours: Temp:  [97.8 F (36.6 C)-100.9 F (38.3 C)] 97.9 F (36.6 C) (06/06 0512) Pulse Rate:  [97-106] 99 (06/06 0512) Resp:  [17-20] 20 (06/06 0512) BP: (100-134)/(62-86) 114/86 (06/06 0512) SpO2:  [90 %-93 %] 92 % (06/06 0512)    Intake/Output from previous day: 06/05 0701 - 06/06 0700 In: 240 [P.O.:240] Out: 200 [Urine:200] Intake/Output this shift: No intake/output data recorded.  General appearance: alert, cooperative, and mild distress Resp: clear to auscultation bilaterally Cardio: regular rate and rhythm GI: moderate diffuse tenderness  Lab Results:  Recent Labs    03/12/22 1705 03/13/22 2214  WBC 16.9* 14.3*  HGB 13.3 14.1  HCT 42.0 42.8  PLT 272 272   BMET Recent Labs    03/12/22 1705 03/13/22 2214  NA 138  --   K 3.8  --   CL 101  --   CO2 27  --   GLUCOSE 115*  --   BUN 16  --   CREATININE 0.75 0.87  CALCIUM 9.4  --    PT/INR No results for input(s): LABPROT, INR in the last 72 hours. ABG No results for input(s): PHART, HCO3 in the last 72 hours.  Invalid input(s): PCO2, PO2  Studies/Results: CT Abdomen Pelvis Wo Contrast  Result Date: 03/12/2022 CLINICAL DATA:  76 year old female with acute abdominal and pelvic pain. Cholecystectomy on 03/10/2022. EXAM: CT ABDOMEN AND PELVIS WITHOUT CONTRAST TECHNIQUE: Multidetector CT imaging of the abdomen and pelvis was performed following the standard protocol without IV contrast. RADIATION DOSE REDUCTION: This exam was performed according to the departmental dose-optimization program which includes automated exposure control, adjustment of the mA and/or kV according to patient size and/or use of iterative reconstruction technique. COMPARISON:  01/13/2022 CT and prior studies FINDINGS: Please note that parenchymal and vascular abnormalities may be missed as intravenous contrast was not administered.  Lower chest: Mild RIGHT basilar atelectasis noted. Hepatobiliary: Patient is status post cholecystectomy. Mild stranding in the cholecystectomy bed noted without focal collection or gas in the cholecystectomy bed. The liver is unremarkable. There is no evidence of intrahepatic or extrahepatic biliary dilatation. Pancreas: Unremarkable Spleen: Unremarkable. Adrenals/Urinary Tract: Punctate nonobstructing bilateral renal calculi are identified. A RIGHT renal cyst is again identified and needs no follow-up. The adrenal glands and bladder are unremarkable. Stomach/Bowel: A large hiatal hernia is again noted. Appendix appears normal. No evidence of bowel wall thickening, distention, or inflammatory changes. Small bowel and colonic diverticulosis noted without evidence of acute diverticulitis. Vascular/Lymphatic: Aortic atherosclerosis. No enlarged abdominal or pelvic lymph nodes. Reproductive: Uterus and bilateral adnexa are unremarkable. Other: A trace amount of free fluid in the pelvis and adjacent to the spleen are noted, not expected given recent surgery. No evidence of pneumoperitoneum or focal collection/abscess. Stranding/small foci of gas in the anterior abdominal subcutaneous tissues and umbilicus are compatible with laparoscopic surgical changes. Musculoskeletal: No acute or suspicious bony abnormalities are noted. Degenerative changes in the lumbar spine again noted. IMPRESSION: 1. Status post cholecystectomy without definite complicating features. Mild stranding in the cholecystectomy bed and trace amount of free fluid in the pelvis and adjacent to the spleen, not expected given recent surgery. 2. Punctate nonobstructing bilateral renal calculi. 3. Mild RIGHT basilar atelectasis. 4. Large hiatal hernia. 5. Aortic Atherosclerosis (ICD10-I70.0). Electronically Signed   By: Margarette Canada M.D.   On: 03/12/2022 18:36   DG Chest 2 View  Result Date: 03/12/2022 CLINICAL DATA:  Recent cholecystectomy presenting with  pain radiating from the left shoulder into the left upper quadrant. EXAM: CHEST - 2 VIEW COMPARISON:  January 08, 2022 FINDINGS: The heart size and mediastinal contours are within normal limits. There is a moderate-sized hiatal hernia. A trace amount of left basilar linear atelectasis is seen. Both lungs are otherwise clear. Radiopaque surgical clips are seen within the right upper quadrant. The visualized skeletal structures are unremarkable. IMPRESSION: 1. Trace amount of left basilar linear atelectasis. 2. Moderate-sized hiatal hernia. Electronically Signed   By: Virgina Norfolk M.D.   On: 03/12/2022 21:21    Anti-infectives: Anti-infectives (From admission, onward)    None       Assessment/Plan: s/p * No surgery found * Recheck lft's and wbc. Check lipase. Plan to repeat CT given persistent pain. Not clear what the etiology of her postoperative pain is. CT 2 days ago showed no complication of recent gallbladder surgery. Lft's have been normal. Wbc has been elevated but no sign of uti or pneumonia Will start empiric abx since the wbc is elevated and pain is getting worse and will continue to look for source Lovenox for vte prophylaxis Increase IVF for hydration Will check troponin as well  LOS: 1 day    [name] 03/14/2022

## 2022-03-14 NOTE — Significant Event (Signed)
Rapid Response Event Note   Reason for Call :  Abdominal pain and diaphoretic  Initial Focused Assessment:  Patient is lying in bed.  She is very fatigued and diaphoretic.  She is alert and oriented.  Complaining of sever abdominal pain in her lower abdomen.  Diffuse tenderness, soft abdomen, incision sites unremarkable Lung sounds clear, decreased bases Heart tones regular BP 88/64  HR 111  RR 40 O2 sat 91% on 4L [place] CBG 160 Bladder scan 0,  she states she has recently voided.  She is on a Morphine PCA pump: ETCO2 26  Husband at bedside  Interventions:  Bolus infusing, increased rate NRB O2 sats 100% Placed 2nd NSL: 20ga Left AC  CT scan resulted Dr Marlou Starks at bedside Consent for surgery signed by husband Transported to [place] of Care:     Event Summary:   MD Notified: Dr Autumn Messing Call Time: [place] Time: 6712 End Time: [place]  Raliegh Ip, RN

## 2022-03-14 NOTE — Anesthesia Procedure Notes (Signed)

## 2022-03-14 NOTE — Progress Notes (Signed)
Date and time results received: 03/14/22 1700 (use smartphrase ".now" to insert current time)  Test: Lactic Acid  Critical Value: 7.3  Name of Provider Notified: Rosendo Gros, MD   Orders Received? Or Actions Taken?: None at this time.

## 2022-03-14 NOTE — Progress Notes (Signed)
PCA set up for patient with Help from [facility]th[facility], Suezanne Jacquet. Pt is requiring more O2 she was on 2L satting 88 now on 4L satting 89-90% also  she is diaphoretric she was like this this AM but it has gotten worse and her RR are in the 30's ranging from 32-36. Rapid called and MD notified

## 2022-03-14 NOTE — Op Note (Signed)
03/13/2022 - 03/14/2022  2:04 PM  PATIENT:  Katie Weber  76 y.o. female  PRE-OPERATIVE DIAGNOSIS:  post op pain  POST-OPERATIVE DIAGNOSIS:  bile leak from liver bed  PROCEDURE:  Procedure(s): LAPAROSCOPY DIAGNOSTIC, DRAINAGE OF BILEOMA  SURGEON:  Surgeon(s) and Role:    * Jovita Kussmaul, MD - Primary    * White, Sharon Mt, MD - Assisting  PHYSICIAN ASSISTANT:   ASSISTANTS: Dr. Dema Severin   ANESTHESIA:   local and general  EBL:  minimal   BLOOD ADMINISTERED:none  DRAINS: (1) Blake drain(s) in the gallbladder bed of liver    LOCAL MEDICATIONS USED:  MARCAINE     SPECIMEN:  No Specimen  DISPOSITION OF SPECIMEN:  N/A  COUNTS:  YES  TOURNIQUET:  * No tourniquets in log *  DICTATION: .Dragon Dictation  After informed consent was obtained the patient was brought to the operating room and placed in the supine position on the operating table.  After adequate induction of general anesthesia the patient's abdomen was prepped with ChloraPrep, allowed to dry, and draped in usual sterile manner.  An appropriate timeout was performed.  The previous infraumbilical incision was opened sharply with a 15 blade knife.  A 0 Vicryl pursestring stitch was placed in the fascia surround the opening.  A Hassan cannula was placed through the opening and anchored in place with the previously placed Vicryl pursestring stitch.  The abdomen was then insufflated with carbon dioxide without difficulty.  The camera was placed through the [facility] cannula and the abdomen was inspected.  There was a moderate amount of bile within the abdominal cavity.  Two 5 mm ports were then placed through the 2 upper more anterior previous 5 mm ports under direct vision.  The bile was aspirated and the abdominal cavity was washed.  I was able to reinspect the gallbladder bed of the liver.  The clips and Endoloop were intact.  There was a small area of bile leaking from the liver bed itself.  This was fulgurated with the  electrocautery until the area was hemostatic and not leaking any more bile.  A 19 French round Blake drain was then brought into the abdominal cavity and out the lateral 5 mm port.  The drain was placed in the gallbladder bed of the liver.  No other abnormalities were noted on general inspection of the abdominal cavity.  The drain was anchored to the skin with a 3-0 nylon stitch.  The rest of the ports were then removed under direct vision and the gas was allowed to escape.  The fascial defect at the infraumbilical site was closed with the previously placed Vicryl pursestring stitch as well as with another figure-of-eight 0 Vicryl stitch.  The skin incisions were closed with interrupted 4-0 Monocryl subcuticular stitches.  Dermabond dressings were applied.  The patient tolerated the procedure well.  At the end of the case all needle sponge and instrument counts were correct.  The patient was then awakened and taken to recovery in stable condition.  PLAN OF CARE: Admit to inpatient   PATIENT DISPOSITION:  ICU - extubated and stable.   Delay start of Pharmacological VTE agent (>24hrs) due to surgical blood loss or risk of bleeding: no

## 2022-03-14 NOTE — Progress Notes (Signed)
Verbal order to increase IVF to 176m/hr  per trauma MD

## 2022-03-14 NOTE — H&P (Signed)
Katie Weber is an 76 y.o. female.   Chief Complaint: abd pain HPI: The patient is a 76 year old white female who had gallbladder removed at end of last week. She has had persistent pain since then. No nausea or vomiting. No fever or chill. Pain was left sided and now is generalized. CT over weekend was unremarkable. Lft's have been normal. Only wbc has been elevated  Past Medical History:  Diagnosis Date   Anemia    when pt was younger   Arthritis    arthritis fingers   Asthma    pt thinks this issue has resolved and wasn't actually asthma, just irritation caused from "scented plug-ins"   Coronary artery calcification 04/24/2017   Diverticulitis    x2 -last 1'17 tx. outpatient.   Environmental and seasonal allergies    2014 Possibly related to air freshners.   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   Family history of anesthesia complication 52DPO ago   daughter had an allergic reaction to atropine   GERD (gastroesophageal reflux disease)    no meds   History of hiatal hernia 09/14/2014   seen on xray   History of kidney stones    '80's lithotripsy-[facility] x1   History of kidney stones    x1   Hyperlipidemia    Hypothyroidism    Pneumonia 2014   Pure hypercholesterolemia    Tremor of left hand     Past Surgical History:  Procedure Laterality Date   BALLOON DILATION N/A 05/04/2014   Procedure: BALLOON DILATION;  Surgeon: Garlan Fair, MD;  Location: [facility];  Service: Endoscopy;  Laterality: N/A;   BREAST LUMPECTOMY Right 1972   benign    BREAST SURGERY Right    '72 -benign tumor   childbirth- NVD x2      CHOLECYSTECTOMY N/A 03/10/2022   Procedure: LAPAROSCOPIC CHOLECYSTECTOMY;  Surgeon: Jovita Kussmaul, MD;  Location: [place];  Service: General;  Laterality: N/A;   COLONOSCOPY WITH PROPOFOL N/A 05/04/2014   Procedure: COLONOSCOPY WITH PROPOFOL;  Surgeon: Garlan Fair, MD;  Location: [facility];  Service: Endoscopy;  Laterality: N/A;   DILATION AND CURETTAGE OF UTERUS     04-20-14 [facility] hospital endometrial polyp removed.   ESOPHAGOGASTRODUODENOSCOPY (EGD) WITH PROPOFOL N/A 05/04/2014   Procedure: ESOPHAGOGASTRODUODENOSCOPY (EGD) WITH PROPOFOL;  Surgeon: Garlan Fair, MD;  Location: [facility];  Service: Endoscopy;  Laterality: N/A;. Procedure done in [place], [place]- 5 yrs ago.   HYSTEROSCOPY WITH D & C N/A 04/20/2014   Procedure: DILATATION AND CURETTAGE /HYSTEROSCOPY;  Surgeon: [name], MD;  Location: [facility];  Service: Gynecology;  Laterality: N/A;   IR PERC CHOLECYSTOSTOMY  01/08/2022   IR RADIOLOGIST EVAL & MGMT  02/10/2022   LITHOTRIPSY     POLYPECTOMY  2020   Dr. Lear Ng   TOTAL KNEE ARTHROPLASTY Left 02/15/2021   Procedure: LEFT TOTAL KNEE ARTHROPLASTY;  Surgeon: Mcarthur Rossetti, MD;  Location: [place];  Service: Orthopedics;  Laterality: Left;   TOTAL KNEE ARTHROPLASTY Right 08/09/2021   Procedure: RIGHT TOTAL KNEE ARTHROPLASTY;  Surgeon: Mcarthur Rossetti, MD;  Location: [place];  Service: Orthopedics;  Laterality: Right;  Needs RNFA   TUBAL LIGATION      Family History  Problem Relation Age of Onset   Cancer Other    Endometrial cancer Mother    CAD Father    Breast cancer Maternal Aunt  Social History:  reports that she has never smoked. She has never used smokeless tobacco. She reports current alcohol use. She reports that she does not use drugs.  Allergies:  Allergies  Allergen Reactions   Contrast Media [Iodinated Contrast Media] Shortness Of Breath and Other (See Comments)    Tightness in chest. Trouble breathing   Ciprofloxacin Itching and Rash    Itching and rash on arm/chest   Clindamycin/Lincomycin Nausea Only   Penicillins Hives    .Marland KitchenHas patient had a PCN reaction causing immediate rash, facial/tongue/throat swelling, SOB or lightheadedness with hypotension: No Has patient had a PCN reaction causing severe  rash involving mucus membranes or skin necrosis: No Has patient had a PCN reaction that required hospitalization No Has patient had a PCN reaction occurring within the last 10 years: No If all of the above answers are "NO", then may proceed with Cephalosporin use.     Medications Prior to Admission  Medication Sig Dispense Refill   alendronate (FOSAMAX) 70 MG tablet Take 70 mg by mouth once a week. Take with a full glass of water on an empty stomach. Take on Fridays     Cholecalciferol (VITAMIN D) 50 MCG (2000 UT) tablet Take 2,000 Units by mouth 3 (three) times a week.     levothyroxine (SYNTHROID, LEVOTHROID) 100 MCG tablet Take 100 mcg by mouth daily before breakfast.     oxyCODONE (ROXICODONE) 5 MG immediate release tablet Take 1 tablet (5 mg total) by mouth every 6 (six) hours as needed for severe pain. 15 tablet 0   pantoprazole (PROTONIX) 40 MG tablet Take 40 mg by mouth daily.     vitamin B-12 (CYANOCOBALAMIN) 1000 MCG tablet Take 1,000 mcg by mouth 3 (three) times a week.     vitamin C (ASCORBIC ACID) 500 MG tablet Take 500 mg by mouth 3 (three) times a week.     zinc gluconate 50 MG tablet Take 50 mg by mouth 3 (three) times a week.     sodium chloride flush 0.9 % SOLN injection Flush gallbladder drain once daily with 5 ml syringe. (Patient not taking: Reported on 03/13/2022) 300 mL 3    Results for orders placed or performed during the hospital encounter of 03/13/22 (from the past 48 hour(s))  CBC     Status: Abnormal   Collection Time: 03/13/22 10:14 PM  Result Value Ref Range   WBC 14.3 (H) 4.0 - 10.5 K/uL   RBC 5.02 3.87 - 5.11 MIL/uL   Hemoglobin 14.1 12.0 - 15.0 g/dL   HCT 42.8 36.0 - 46.0 %   MCV 85.3 80.0 - 100.0 fL   MCH 28.1 26.0 - 34.0 pg   MCHC 32.9 30.0 - 36.0 g/dL   RDW 16.0 (H) 11.5 - 15.5 %   Platelets 272 150 - 400 K/uL   nRBC 0.0 0.0 - 0.2 %    Comment: Performed at [facility], [place] [address], [place], [place] [address]  Creatinine, serum      Status: None   Collection Time: 03/13/22 10:14 PM  Result Value Ref Range   Creatinine, Ser 0.87 0.44 - 1.00 mg/dL   GFR, Estimated >60 >60 mL/min    Comment: (NOTE) Calculated using the CKD-EPI Creatinine Equation (2021) Performed at [facility] [address], [place], [place] [address]   CBC with Differential/Platelet     Status: Abnormal   Collection Time: 03/14/22  8:29 AM  Result Value Ref Range   WBC 20.0 (H)  4.0 - 10.5 K/uL   RBC 5.18 (H) 3.87 - 5.11 MIL/uL   Hemoglobin 14.5 12.0 - 15.0 g/dL   HCT 44.6 36.0 - 46.0 %   MCV 86.1 80.0 - 100.0 fL   MCH 28.0 26.0 - 34.0 pg   MCHC 32.5 30.0 - 36.0 g/dL   RDW 16.2 (H) 11.5 - 15.5 %   Platelets 303 150 - 400 K/uL   nRBC 0.0 0.0 - 0.2 %   Neutrophils Relative % 86 %   Neutro Abs 17.2 (H) 1.7 - 7.7 K/uL   Lymphocytes Relative 6 %   Lymphs Abs 1.2 0.7 - 4.0 K/uL   Monocytes Relative 8 %   Monocytes Absolute 1.6 (H) 0.1 - 1.0 K/uL   Eosinophils Relative 0 %   Eosinophils Absolute 0.0 0.0 - 0.5 K/uL   Basophils Relative 0 %   Basophils Absolute 0.0 0.0 - 0.1 K/uL   Immature Granulocytes 0 %   Abs Immature Granulocytes 0.07 0.00 - 0.07 K/uL    Comment: Performed at [facility] [address], [place], [place] [address]  Resp Panel by RT-PCR (Flu A&B, Covid) Urine, Clean Catch     Status: None   Collection Time: 03/14/22  9:20 AM   Specimen: Urine, Clean Catch; Nasal Swab  Result Value Ref Range   SARS Coronavirus 2 by RT PCR NEGATIVE NEGATIVE    Comment: (NOTE) SARS-CoV-2 target nucleic acids are NOT DETECTED.  The SARS-CoV-2 RNA is generally detectable in upper respiratory specimens during the acute phase of infection. The lowest concentration of SARS-CoV-2 viral copies this assay can detect is 138 copies/mL. A negative result does not preclude SARS-Cov-2 infection and should not be used as the sole basis for treatment or other patient management decisions. A negative result may occur with  improper  specimen collection/handling, submission of specimen other than nasopharyngeal swab, presence of viral mutation(s) within the areas targeted by this assay, and inadequate number of viral copies(<138 copies/mL). A negative result must be combined with clinical observations, patient history, and epidemiological information. The expected result is Negative.  Fact Sheet for Patients:  EntrepreneurPulse.com.au  Fact Sheet for Healthcare Providers:  IncredibleEmployment.be  This test is no t yet approved or cleared by the [place] FDA and  has been authorized for detection and/or diagnosis of SARS-CoV-2 by FDA under an Emergency Use Authorization (EUA). This EUA will remain  in effect (meaning this test can be used) for the duration of the COVID-19 declaration under Section 564(b)(1) of the Act, 21 U.S.C.section 360bbb-3(b)(1), unless the authorization is terminated  or revoked sooner.       Influenza A by PCR NEGATIVE NEGATIVE   Influenza B by PCR NEGATIVE NEGATIVE    Comment: (NOTE) The Xpert Xpress SARS-CoV-2/FLU/RSV plus assay is intended as an aid in the diagnosis of influenza from Nasopharyngeal swab specimens and should not be used as a sole basis for treatment. Nasal washings and aspirates are unacceptable for Xpert Xpress SARS-CoV-2/FLU/RSV testing.  Fact Sheet for Patients: EntrepreneurPulse.com.au  Fact Sheet for Healthcare Providers: IncredibleEmployment.be  This test is not yet approved or cleared by the [place] FDA and has been authorized for detection and/or diagnosis of SARS-CoV-2 by FDA under an Emergency Use Authorization (EUA). This EUA will remain in effect (meaning this test can be used) for the duration of the COVID-19 declaration under Section 564(b)(1) of the Act, 21 U.S.C. section 360bbb-3(b)(1), unless the authorization is terminated or revoked.  Performed at [facility], [place] [address] [place], [place] [address]   Urinalysis, Routine w reflex microscopic Urine, Clean Catch     Status: Abnormal   Collection Time: 03/14/22  9:25 AM  Result Value Ref Range   Color, Urine AMBER (A) YELLOW    Comment: BIOCHEMICALS MAY BE AFFECTED BY COLOR   APPearance HAZY (A) CLEAR   Specific Gravity, Urine 1.025 1.005 - 1.030   pH 5.0 5.0 - 8.0   Glucose, UA NEGATIVE NEGATIVE mg/dL   Hgb urine dipstick NEGATIVE NEGATIVE   Bilirubin Urine NEGATIVE NEGATIVE   Ketones, ur 20 (A) NEGATIVE mg/dL   Protein, ur 100 (A) NEGATIVE mg/dL   Nitrite NEGATIVE NEGATIVE   Leukocytes,Ua NEGATIVE NEGATIVE   RBC / HPF 0-5 0 - 5 RBC/hpf   WBC, UA 0-5 0 - 5 WBC/hpf   Bacteria, UA RARE (A) NONE SEEN   Squamous Epithelial / LPF 6-10 0 - 5   Non Squamous Epithelial 0-5 (A) NONE SEEN    Comment: Performed at [facility], [place] [address], [place], [place] [address]  CBC with Differential/Platelet     Status: Abnormal (Preliminary result)   Collection Time: 03/14/22  9:59 AM  Result Value Ref Range   WBC 18.1 (H) 4.0 - 10.5 K/uL   RBC 5.31 (H) 3.87 - 5.11 MIL/uL   Hemoglobin 15.0 12.0 - 15.0 g/dL   HCT 46.4 (H) 36.0 - 46.0 %   MCV 87.4 80.0 - 100.0 fL   MCH 28.2 26.0 - 34.0 pg   MCHC 32.3 30.0 - 36.0 g/dL   RDW 16.2 (H) 11.5 - 15.5 %   Platelets 318 150 - 400 K/uL   nRBC 0.0 0.0 - 0.2 %    Comment: Performed at [facility] [address], [place], [place] [address]   Neutrophils Relative % PENDING %   Neutro Abs PENDING 1.7 - 7.7 K/uL   Band Neutrophils PENDING %   Lymphocytes Relative PENDING %   Lymphs Abs PENDING 0.7 - 4.0 K/uL   Monocytes Relative PENDING %   Monocytes Absolute PENDING 0.1 - 1.0 K/uL   Eosinophils Relative PENDING %   Eosinophils Absolute PENDING 0.0 - 0.5 K/uL   Basophils Relative PENDING %   Basophils Absolute PENDING 0.0 - 0.1 K/uL   WBC Morphology PENDING    RBC Morphology PENDING    Smear Review PENDING    Other PENDING %    nRBC PENDING 0 /100 WBC   Metamyelocytes Relative PENDING %   Myelocytes PENDING %   Promyelocytes Relative PENDING %   Blasts PENDING %   Immature Granulocytes PENDING %   Abs Immature Granulocytes PENDING 0.00 - 0.07 K/uL   CT Abdomen Pelvis Wo Contrast  Result Date: 03/12/2022 CLINICAL DATA:  76 year old female with acute abdominal and pelvic pain. Cholecystectomy on 03/10/2022. EXAM: CT ABDOMEN AND PELVIS WITHOUT CONTRAST TECHNIQUE: Multidetector CT imaging of the abdomen and pelvis was performed following the standard protocol without IV contrast. RADIATION DOSE REDUCTION: This exam was performed according to the departmental dose-optimization program which includes automated exposure control, adjustment of the mA and/or kV according to patient size and/or use of iterative reconstruction technique. COMPARISON:  01/13/2022 CT and prior studies FINDINGS: Please note that parenchymal and vascular abnormalities may be missed as intravenous contrast was not administered. Lower chest: Mild RIGHT basilar atelectasis noted. Hepatobiliary: Patient is status post cholecystectomy. Mild stranding in the cholecystectomy bed noted without focal collection or gas in the cholecystectomy bed.  The liver is unremarkable. There is no evidence of intrahepatic or extrahepatic biliary dilatation. Pancreas: Unremarkable Spleen: Unremarkable. Adrenals/Urinary Tract: Punctate nonobstructing bilateral renal calculi are identified. A RIGHT renal cyst is again identified and needs no follow-up. The adrenal glands and bladder are unremarkable. Stomach/Bowel: A large hiatal hernia is again noted. Appendix appears normal. No evidence of bowel wall thickening, distention, or inflammatory changes. Small bowel and colonic diverticulosis noted without evidence of acute diverticulitis. Vascular/Lymphatic: Aortic atherosclerosis. No enlarged abdominal or pelvic lymph nodes. Reproductive: Uterus and bilateral adnexa are unremarkable. Other:  A trace amount of free fluid in the pelvis and adjacent to the spleen are noted, not expected given recent surgery. No evidence of pneumoperitoneum or focal collection/abscess. Stranding/small foci of gas in the anterior abdominal subcutaneous tissues and umbilicus are compatible with laparoscopic surgical changes. Musculoskeletal: No acute or suspicious bony abnormalities are noted. Degenerative changes in the lumbar spine again noted. IMPRESSION: 1. Status post cholecystectomy without definite complicating features. Mild stranding in the cholecystectomy bed and trace amount of free fluid in the pelvis and adjacent to the spleen, not expected given recent surgery. 2. Punctate nonobstructing bilateral renal calculi. 3. Mild RIGHT basilar atelectasis. 4. Large hiatal hernia. 5. Aortic Atherosclerosis (ICD10-I70.0). Electronically Signed   By: Margarette Canada M.D.   On: 03/12/2022 18:36   DG Chest 2 View  Result Date: 03/12/2022 CLINICAL DATA:  Recent cholecystectomy presenting with pain radiating from the left shoulder into the left upper quadrant. EXAM: CHEST - 2 VIEW COMPARISON:  January 08, 2022 FINDINGS: The heart size and mediastinal contours are within normal limits. There is a moderate-sized hiatal hernia. A trace amount of left basilar linear atelectasis is seen. Both lungs are otherwise clear. Radiopaque surgical clips are seen within the right upper quadrant. The visualized skeletal structures are unremarkable. IMPRESSION: 1. Trace amount of left basilar linear atelectasis. 2. Moderate-sized hiatal hernia. Electronically Signed   By: Virgina Norfolk M.D.   On: 03/12/2022 21:21    Review of Systems  Constitutional:  Positive for diaphoresis.  HENT: Negative.    Eyes: Negative.   Respiratory: Negative.    Cardiovascular: Negative.   Gastrointestinal:  Positive for abdominal pain.  Endocrine: Negative.   Genitourinary: Negative.   Musculoskeletal: Negative.   Skin: Negative.   Allergic/Immunologic:  Negative.   Neurological: Negative.   Hematological: Negative.   Psychiatric/Behavioral: Negative.     Blood pressure 104/63, pulse 98, temperature 98.2 F (36.8 C), temperature source Oral, resp. rate 19, height 5' 2.5" (1.588 m), SpO2 92 %. Physical Exam Constitutional:      Appearance: Normal appearance.  HENT:     Head: Normocephalic and atraumatic.     Right Ear: External ear normal.     Left Ear: External ear normal.     Nose: Nose normal.     Mouth/Throat:     Mouth: Mucous membranes are dry.     Pharynx: Oropharynx is clear.  Eyes:     Extraocular Movements: Extraocular movements intact.     Conjunctiva/sclera: Conjunctivae normal.     Pupils: Pupils are equal, round, and reactive to light.  Cardiovascular:     Rate and Rhythm: Normal rate and regular rhythm.     Pulses: Normal pulses.     Heart sounds: Normal heart sounds.  Pulmonary:     Effort: Pulmonary effort is normal. No respiratory distress.     Breath sounds: Normal breath sounds.  Abdominal:     Comments: Moderate diffuse tenderness. Incsisions look  good  Musculoskeletal:        General: No swelling or deformity. Normal range of motion.     Cervical back: Normal range of motion and neck supple.  Skin:    General: Skin is dry.     Coloration: Skin is not jaundiced.     Comments: clammy  Neurological:     General: No focal deficit present.     Mental Status: She is alert and oriented to person, place, and time.  Psychiatric:        Mood and Affect: Mood normal.        Behavior: Behavior normal.        Thought Content: Thought content normal.     Assessment/Plan The patient is having postop abd pain from unknown source. Will admit for IV hydration and pain control. Will likely repeat CT and labs. Consider starting empiric abx if wbc remains elevated  [name], MD 03/14/2022, 10:42 AM

## 2022-03-14 NOTE — Anesthesia Preprocedure Evaluation (Addendum)
Anesthesia Evaluation  Patient identified by MRN, date of birth, ID band Patient awake    Reviewed: Allergy & Precautions, NPO status , Patient's Chart, lab work & pertinent test results  Airway Mallampati: III  TM Distance: >3 FB Neck ROM: Full    Dental  (+) Teeth Intact, Dental Advisory Given, Poor Dentition   Pulmonary asthma ,  Inc WOB over last 24h, now on HFNC and still appears labored   Pulmonary exam normal breath sounds clear to auscultation       Cardiovascular + CAD  Normal cardiovascular exam Rhythm:Regular Rate:Normal     Neuro/Psych negative neurological ROS  negative psych ROS   GI/Hepatic Neg liver ROS, hiatal hernia, GERD  Controlled,S/p lap CCY 4d ago- Ct shows more fluid in the RUQ than it did 2 days ago. I am concerned about a bile leak from cystic duct stump. Since her breathing has gotten so much worse today I will plan to take her back to OR for a diagnostic laparoscopy and possible ex lap depending on the findings   Endo/Other  Hypothyroidism   Renal/GU negative Renal ROS  negative genitourinary   Musculoskeletal  (+) Arthritis , Osteoarthritis,    Abdominal   Peds negative pediatric ROS (+)  Hematology negative hematology ROS (+)   Anesthesia Other Findings   Reproductive/Obstetrics negative OB ROS                            Anesthesia Physical Anesthesia Plan  ASA: 4  Anesthesia Plan: General   Post-op Pain Management: Ofirmev IV (intra-op)*   Induction: Intravenous  PONV Risk Score and Plan: Ondansetron, Dexamethasone and Midazolam  Airway Management Planned: Oral ETT  Additional Equipment:   Intra-op Plan:   Post-operative Plan: Extubation in OR and Possible Post-op intubation/ventilation  Informed Consent: I have reviewed the patients History and Physical, chart, labs and discussed the procedure including the risks, benefits and alternatives for  the proposed anesthesia with the patient or authorized representative who has indicated his/her understanding and acceptance.     Dental advisory given  Plan Discussed with: CRNA  Anesthesia Plan Comments: (Ventilation: Mask ventilation without difficulty Laryngoscope Size: Miller and 2 Grade View: Grade I Tube type: Oral Tube size: 7.0 mm Number of attempts: 1)        Anesthesia Quick Evaluation

## 2022-03-14 NOTE — Anesthesia Postprocedure Evaluation (Signed)
Anesthesia Post Note  Patient: Katie Weber  Procedure(s) Performed: LAPAROSCOPY DIAGNOSTIC  WITH IRRIGATION AND  DRAINAGE OF BILEOMA (Abdomen)     Patient location during evaluation: PACU Anesthesia Type: General Level of consciousness: awake Pain management: pain level controlled Vital Signs Assessment: post-procedure vital signs reviewed and stable Cardiovascular status: stable Postop Assessment: no apparent nausea or vomiting Anesthetic complications: no   No notable events documented.  Last Vitals:  Vitals:   03/14/22 1505 03/14/22 1520  BP: 90/62 (!) 90/58  Pulse: 91 98  Resp: 13 18  Temp:    SpO2: 93% 92%    Last Pain:  Vitals:   03/14/22 1450  TempSrc:   PainSc: Asleep                 Vinaya Sancho

## 2022-03-14 NOTE — Anesthesia Postprocedure Evaluation (Signed)
Anesthesia Post Note  Patient: Katie Weber  Procedure(s) Performed: LAPAROSCOPY DIAGNOSTIC  WITH IRRIGATION AND  DRAINAGE OF BILEOMA (Abdomen)     Patient location during evaluation: PACU Anesthesia Type: General Level of consciousness: awake and alert, oriented and patient cooperative Pain management: pain level controlled Vital Signs Assessment: post-procedure vital signs reviewed and stable Respiratory status: spontaneous breathing, nonlabored ventilation and respiratory function stable Cardiovascular status: blood pressure returned to baseline and stable Postop Assessment: no apparent nausea or vomiting Anesthetic complications: no   No notable events documented.  Last Vitals:  Vitals:   03/14/22 1420 03/14/22 1435  BP: 90/72 94/61  Pulse: 90 95  Resp: (!) 21 18  Temp: 36.6 C   SpO2: 94% 94%    Last Pain:  Vitals:   03/14/22 1420  TempSrc:   PainSc: 0-No pain                 Pervis Hocking

## 2022-03-14 NOTE — Transfer of Care (Signed)
Immediate Anesthesia Transfer of Care Note  Patient: Katie Weber  Procedure(s) Performed: LAPAROSCOPY DIAGNOSTIC  WITH IRRIGATION AND  DRAINAGE OF BILEOMA (Abdomen)  Patient Location: PACU  Anesthesia Type:General  Level of Consciousness: awake, alert  and oriented  Airway & Oxygen Therapy: Patient Spontanous Breathing and Patient connected to face mask oxygen  Post-op Assessment: Report given to RN, Post -op Vital signs reviewed and stable and Patient moving all extremities  Post vital signs: Reviewed and stable  Last Vitals:  Vitals Value Taken Time  BP 90/72 03/14/22 1421  Temp    Pulse 98 03/14/22 1426  Resp 15 03/14/22 1426  SpO2 93 % 03/14/22 1426  Vitals shown include unvalidated device data.  Last Pain:  Vitals:   03/14/22 1149  TempSrc:   PainSc: 10-Worst pain ever      Patients Stated Pain Goal: 3 (24/82/50 0370)  Complications: No notable events documented.

## 2022-03-14 NOTE — Progress Notes (Signed)
Pt husband very rude towards RN stating that his wife's pain is not getting better and nothing seems to helping. MD ordered PCA pump to be set up waiting for Pharmacy to send medication up. Told husband MD would have to send a referral to get a hospitalist on board that I could not get  a hospitalist myself he stated " Well in times like this protocol shouldn't matter"

## 2022-03-14 NOTE — Consult Note (Addendum)
NAME:  Katie Weber, MRN:  [id number], DOB:  01/14/1946, LOS: 1 ADMISSION DATE:  03/13/2022 CONSULTATION DATE:  03/14/2022 REFERRING MD:  Marlou Starks - CCS CHIEF COMPLAINT:  Abdominal pain  History of Present Illness:  76 year old woman who presented to [facility] 6/5 for abdominal pain after recent laparoscopic cholecystectomy (03/10/2022). PMHx significant for HLD, hypothyroidism, GERD with esophageal stenosis (s/p dilatation), nephrolithiasis, diverticulitis, arthritis, osteoporosis.  Patient initially presented to [facility] ED 6/4 for LUQ pain radiating to L chest after being instructed by her surgeon's office. Labs were notable for mild leukocytosis, CT A/P demonstrated mild stranding in the cholecystectomy bed and trace free fluid without significant etiology for LUQ pain. She was discharged from the ED but re-presented 6/5 after abdominal pain continued to worsen. CT A/P 6/6 demonstrated increased L > R upper abdominal fluid with loculation, including to L perihepatic space with c/f delayed bile leak vs. small volume postoperative hemorrhage. CCS admitted for further workup.  Rapid Response was called 6/6 for worsening patient condition, as she was diaphoretic, tachycardic to 100s and tachypneic to 40s with worsening pain and relative hypotension (BP 88/64). Given CT results and c/f bile leak from cystic duct stump, patient was taken back to OR for diagnostic laparoscopy +/- exploratory laparotomy 6/6 (Dr. Marlou Starks). A small bile leak in the liver bed was noted and was cauterized and a drain was left in place.  PCCM consulted for ICU admission and management.  Pertinent Medical History:   Past Medical History:  Diagnosis Date   Arthritis    arthritis fingers   Coronary artery calcification 04/24/2017   Diverticulitis    x2 -last 1'17 tx. outpatient.   Environmental and seasonal allergies    2014 Possibly related to air freshners.   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   Family history of anesthesia complication 00FVC ago   daughter had an allergic reaction to atropine   GERD (gastroesophageal reflux disease)    History of hiatal hernia 09/14/2014   History of kidney stones    '80's lithotripsy-UVA x1   Hyperlipidemia    Hypothyroidism    Pneumonia 2014   Pure hypercholesterolemia    Tremor of left hand    Significant Hospital Events: Including procedures, antibiotic start and stop dates in addition to other pertinent events   6/2 - Lap chole (Dr. Marlou Starks) 6/4 - Presented to ED for abdominal pain, CT grossly unremarkable. Discharged home. 6/6 - Re-presented or worsening abdominal pain. Became diaphoretic and hypotensive/tachycardic/tachypneic. Rapid Response called. Repeat CT A/P with increased fluid in upper quadrants c/f bile leak vs. Hemorrhage. Taken back to OR for diagnostic lap. Found to have liver bed bile leak, cauterized and drain placed. PCCM consulted.  Interim History / Subjective:  PCCM consulted for ICU admission/management Seen in PACU, feeling much better Abdominal pain decreased, "tolerable' On 4L Walker, previously on simple mask in PACU Maintaining O2 sat SBP soft (90s), MAP 70s  Objective:  Blood pressure 90/72, pulse 90, temperature 97.9 F (36.6 C), resp. rate (!) 21, height 5' 2.5" (1.588 m), SpO2 94 %.        Intake/Output Summary (Last 24 hours) at 03/14/2022 1438 Last data filed at 03/14/2022 1406 Gross per 24 hour  Intake 2340 ml  Output 230 ml  Net 2110 ml   There were no vitals filed for this visit.  Physical Examination: General: Acutely ill-appearing elderly woman in NAD. HEENT: [place]/AT, anicteric sclera,  PERRL, dry mucous membranes. Neuro: Awake, oriented x 4. Responds to verbal stimuli. Following commands consistently. Moves all 4 extremities spontaneously. CV: RRR, no m/g/r. PULM: Breathing even and unlabored on 4LNC. Lung fields diminished at bilateral bases, clear in upper  fields. GI: Soft, mildly distended, appropriately TTP postoperatively. Lap incisions clean/dry/intact. Blake drain x 1 to RUQ with scant bile-tinged serosanguineous output. Hypoactive bowel sounds. Extremities: Trace bilateral symmetric LE edema noted. Skin: Warm/dry, no rashes.  Resolved Hospital Problem List:    Assessment & Plan:  Bile leak s/p lap chole S/p diagnostic laparoscopy 6/6 S/p laparoscopic cholecystectomy 6/2 Presented to [facility] 6/6 after progressive worsening of abdominal pain postoperatively. CT A/P demonstrating increased L > R upper abdominal fluid with loculation, including to L perihepatic space with c/f delayed bile leak vs. small volume postoperative hemorrhage. CCS admitted for further workup. Now s/p OR takeback 6/6 for diagnostic lap/washout. - Admit to ICU for close monitoring - Trend WBC, fever curve - Trend LA to guide resuscitation - Fluid resuscitation - Goal MAP > 65 - Continue ceftriaxone/Flagyl, stop date per CCS - Postoperative wound care/drain management per CCS - Monitor drain output - Pain control per CCS  Acute respiratory insufficiency, periprocedural - Continue supplemental O2 support - Wean O2 for sat > 90% - Pulmonary hygiene - Encourage mobility once appropriate from a postsurgical standpoint  AKI in the setting of critical illness - Trend BMP - Replete electrolytes as indicated - Monitor I&Os - Avoid nephrotoxic agents as able - Ensure adequate renal perfusion - Suspect AKI will improve post-procedure; low threshold for Nephrology consult if Cr does not plateau  GERD History of esophageal stenosis, s/p dilatation - PPI - SLP evaluation if any c/f dysphagia post-extubation  Hypothyroidism - Continue levothyroxine  Best Practice: (right click and "Reselect all SmartList Selections" daily)   Diet/type: clear liquids DVT prophylaxis: LMWH GI prophylaxis: PPI Lines: N/A Foley:  N/A Code Status:  full code Last date of  multidisciplinary goals of care discussion [Pending]  Labs:  CBC: Recent Labs  Lab 03/12/22 1705 03/13/22 2214 03/14/22 0829 03/14/22 0959  WBC 16.9* 14.3* 20.0* 18.1*  NEUTROABS  --   --  17.2* 15.6*  HGB 13.3 14.1 14.5 15.0  HCT 42.0 42.8 44.6 46.4*  MCV 86.6 85.3 86.1 87.4  PLT 272 272 303 093   Basic Metabolic Panel: Recent Labs  Lab 03/12/22 1705 03/13/22 2214 03/14/22 0959  NA 138  --  139  K 3.8  --  5.0  CL 101  --  102  CO2 27  --  22  GLUCOSE 115*  --  132*  BUN 16  --  26*  CREATININE 0.75 0.87 2.44*  CALCIUM 9.4  --  9.2   GFR: Estimated Creatinine Clearance: 19.3 mL/min (A) (by C-G formula based on SCr of 2.44 mg/dL (H)). Recent Labs  Lab 03/12/22 1705 03/13/22 2214 03/14/22 0829 03/14/22 0959  WBC 16.9* 14.3* 20.0* 18.1*   Liver Function Tests: Recent Labs  Lab 03/12/22 1705 03/14/22 0959  AST 20 20  ALT 20 20  ALKPHOS 52 69  BILITOT 1.1 2.0*  PROT 8.1 7.3  ALBUMIN 4.5 3.1*   Recent Labs  Lab 03/12/22 1705 03/14/22 0959  LIPASE 14 29   No results for input(s): AMMONIA in the last 168 hours.  ABG:    Component Value Date/Time   TCO2 26 01/13/2022 0238   Coagulation Profile: No results for input(s): INR, PROTIME in the last 168 hours.  Cardiac Enzymes:  No results for input(s): CKTOTAL, CKMB, CKMBINDEX, TROPONINI in the last 168 hours.  HbA1C: No results found for: HGBA1C  CBG: Recent Labs  Lab 03/14/22 1217  GLUCAP 160*   Review of Systems:   Review of systems completed with pertinent positives/negatives outlined in above HPI.  Past Medical History:  She,  has a past medical history of Arthritis, Coronary artery calcification (04/24/2017), Diverticulitis, Environmental and seasonal allergies, Esophageal stenosis, Family history of anesthesia complication (33HLK ago), GERD (gastroesophageal reflux disease), History of hiatal hernia (09/14/2014), History of kidney stones, Hyperlipidemia, Hypothyroidism, Pneumonia (2014),  Pure hypercholesterolemia, and Tremor of left hand.   Surgical History:   Past Surgical History:  Procedure Laterality Date   BALLOON DILATION N/A 05/04/2014   Procedure: BALLOON DILATION;  Surgeon: Garlan Fair, MD;  Location: [facility];  Service: Endoscopy;  Laterality: N/A;   BREAST LUMPECTOMY Right 1972   benign    BREAST SURGERY Right    '72 -benign tumor   childbirth- NVD x2      CHOLECYSTECTOMY N/A 03/10/2022   Procedure: LAPAROSCOPIC CHOLECYSTECTOMY;  Surgeon: Jovita Kussmaul, MD;  Location: [facility];  Service: General;  Laterality: N/A;   COLONOSCOPY WITH PROPOFOL N/A 05/04/2014   Procedure: COLONOSCOPY WITH PROPOFOL;  Surgeon: Garlan Fair, MD;  Location: [facility];  Service: Endoscopy;  Laterality: N/A;   DILATION AND CURETTAGE OF UTERUS     04-20-14 [facility] hospital endometrial polyp removed.   ESOPHAGOGASTRODUODENOSCOPY (EGD) WITH PROPOFOL N/A 05/04/2014   Procedure: ESOPHAGOGASTRODUODENOSCOPY (EGD) WITH PROPOFOL;  Surgeon: Garlan Fair, MD;  Location: [facility];  Service: Endoscopy;  Laterality: N/A;. Procedure done in [place], [place]- 5 yrs ago.   HYSTEROSCOPY WITH D & C N/A 04/20/2014   Procedure: DILATATION AND CURETTAGE /HYSTEROSCOPY;  Surgeon: [name], MD;  Location: [facility];  Service: Gynecology;  Laterality: N/A;   IR PERC CHOLECYSTOSTOMY  01/08/2022   IR RADIOLOGIST EVAL & MGMT  02/10/2022   LITHOTRIPSY     POLYPECTOMY  2020   Dr. 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 She has never used smokeless tobacco. She reports  current alcohol use. She reports that she does not use drugs.   Family History:  Her family history includes Breast cancer in her maternal aunt; CAD in her father; Cancer in an other family member; Endometrial cancer in her mother.   Allergies: Allergies  Allergen Reactions   Contrast Media [Iodinated Contrast Media] Shortness Of Breath and Other (See Comments)    Tightness in chest. Trouble breathing   Ciprofloxacin Itching and Rash    Itching and rash on arm/chest   Clindamycin/Lincomycin Nausea Only   Penicillins Hives    .Marland KitchenHas patient had a PCN reaction causing immediate rash, facial/tongue/throat swelling, SOB or lightheadedness with hypotension: No Has patient had a PCN reaction causing severe rash involving mucus membranes or skin necrosis: No Has patient had a PCN reaction that required hospitalization No Has patient had a PCN reaction occurring within the last 10 years: No If all of the above answers are "NO", then  may proceed with Cephalosporin use.    Home Medications: Prior to Admission medications   Medication Sig Start Date End Date Taking? Authorizing Provider  alendronate (FOSAMAX) 70 MG tablet Take 70 mg by mouth once a week. Take with a full glass of water on an empty stomach. Take on Fridays   Yes [provider]  Cholecalciferol (VITAMIN D) 50 MCG (2000 UT) tablet Take 2,000 Units by mouth 3 (three) times a week.   Yes [provider]  levothyroxine (SYNTHROID, LEVOTHROID) 100 MCG tablet Take 100 mcg by mouth daily before breakfast.   Yes [provider]  oxyCODONE (ROXICODONE) 5 MG immediate release tablet Take 1 tablet (5 mg total) by mouth every 6 (six) hours as needed for severe pain. 03/10/22  Yes [name], MD  pantoprazole (PROTONIX) 40 MG tablet Take 40 mg by mouth daily. 01/04/22  Yes [provider]  vitamin B-12 (CYANOCOBALAMIN) 1000 MCG tablet Take 1,000 mcg by mouth 3 (three) times a week.   Yes [provider]  vitamin C (ASCORBIC ACID) 500 MG tablet Take 500 mg by mouth 3 (three) times a week.   Yes [provider]  zinc gluconate 50 MG tablet Take 50 mg by mouth 3 (three) times a week.   Yes [provider]  sodium chloride flush 0.9 % SOLN injection Flush gallbladder drain once daily with 5 ml syringe. Patient not taking: Reported on 03/13/2022 01/10/22   Allred, Shirlyn Goltz, PA-C    Critical care time: 42 minutes   Lestine Mount, [place] [facility] 03/14/22 2:38 PM  Please see [email or web address] for pager details.  From 7A-7P if no response, please call [phone] After hours, please call ELink [phone]

## 2022-03-15 ENCOUNTER — Encounter (HOSPITAL_COMMUNITY): Payer: Self-pay | Admitting: General Surgery

## 2022-03-15 ENCOUNTER — Inpatient Hospital Stay (HOSPITAL_COMMUNITY): Payer: Medicare Other

## 2022-03-15 DIAGNOSIS — E861 Hypovolemia: Secondary | ICD-10-CM | POA: Diagnosis present

## 2022-03-15 DIAGNOSIS — N179 Acute kidney failure, unspecified: Secondary | ICD-10-CM | POA: Diagnosis not present

## 2022-03-15 DIAGNOSIS — R0689 Other abnormalities of breathing: Secondary | ICD-10-CM | POA: Diagnosis not present

## 2022-03-15 LAB — COMPREHENSIVE METABOLIC PANEL
ALT: 17 U/L (ref 0–44)
AST: 27 U/L (ref 15–41)
Albumin: 2.8 g/dL — ABNORMAL LOW (ref 3.5–5.0)
Alkaline Phosphatase: 30 U/L — ABNORMAL LOW (ref 38–126)
Anion gap: 10 (ref 5–15)
BUN: 34 mg/dL — ABNORMAL HIGH (ref 8–23)
CO2: 18 mmol/L — ABNORMAL LOW (ref 22–32)
Calcium: 8 mg/dL — ABNORMAL LOW (ref 8.9–10.3)
Chloride: 112 mmol/L — ABNORMAL HIGH (ref 98–111)
Creatinine, Ser: 2.04 mg/dL — ABNORMAL HIGH (ref 0.44–1.00)
GFR, Estimated: 25 mL/min — ABNORMAL LOW (ref >60)
Glucose, Bld: 87 mg/dL (ref 70–99)
Potassium: 3.9 mmol/L (ref 3.5–5.1)
Sodium: 140 mmol/L (ref 135–145)
Total Bilirubin: 1.5 mg/dL — ABNORMAL HIGH (ref 0.3–1.2)
Total Protein: 5.5 g/dL — ABNORMAL LOW (ref 6.5–8.1)

## 2022-03-15 LAB — CBC
HCT: 36.6 % (ref 36.0–46.0)
Hemoglobin: 11.3 g/dL — ABNORMAL LOW (ref 12.0–15.0)
MCH: 27.4 pg (ref 26.0–34.0)
MCHC: 30.9 g/dL (ref 30.0–36.0)
MCV: 88.8 fL (ref 80.0–100.0)
Platelets: 249 10*3/uL (ref 150–400)
RBC: 4.12 MIL/uL (ref 3.87–5.11)
RDW: 16.2 % — ABNORMAL HIGH (ref 11.5–15.5)
WBC: 7.2 10*3/uL (ref 4.0–10.5)
nRBC: 0 % (ref 0.0–0.2)

## 2022-03-15 LAB — MAGNESIUM: Magnesium: 2.1 mg/dL (ref 1.7–2.4)

## 2022-03-15 LAB — PHOSPHORUS: Phosphorus: 4 mg/dL (ref 2.5–4.6)

## 2022-03-15 LAB — LACTIC ACID, PLASMA: Lactic Acid, Venous: 2.9 mmol/L (ref 0.5–1.9)

## 2022-03-15 IMAGING — CR DG CHEST 1V PORT
1 series · 1 of 1 positions shown · non-contrast
Comparison: [DATE]

CLINICAL DATA: Hypoxia

EXAM:
PORTABLE CHEST 1 VIEW

[AP]
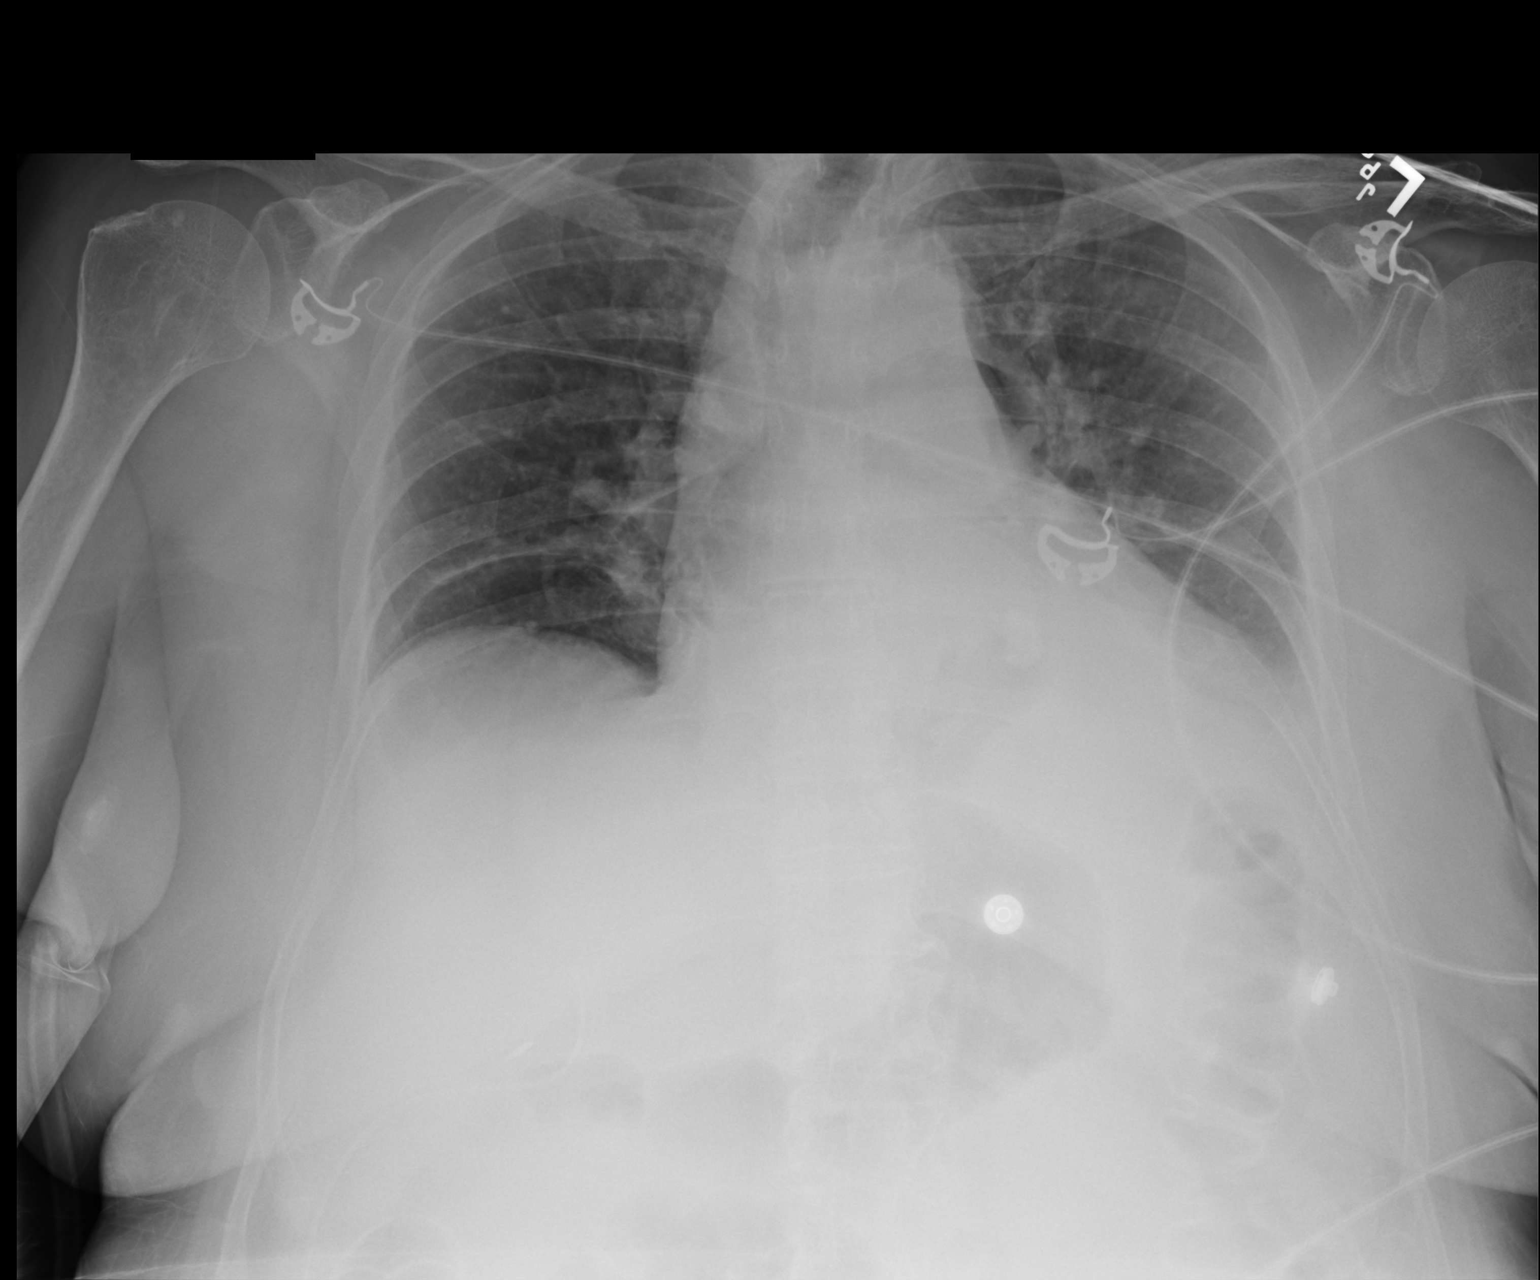

[1 of 1 positions shown; findings below may reference images not displayed]

FINDINGS: The heart size and mediastinal contours are within normal limits.
The lung volumes are low. Patchy consolidation of left lung base is
identified. Minimal pleural fluid is identified in the minor
fissure. The visualized skeletal structures are unremarkable.
IMPRESSION: Left lung base pneumonia.

## 2022-03-15 MED ORDER — LACTATED RINGERS IV SOLN: INTRAVENOUS | Status: DC

## 2022-03-15 MED ORDER — LACTATED RINGERS IV BOLUS
500.0000 mL | Freq: Once | INTRAVENOUS | Status: AC
Start: 2022-03-15 — End: 2022-03-15
  Administered 2022-03-15: 500 mL via INTRAVENOUS

## 2022-03-15 NOTE — Assessment & Plan Note (Addendum)
Creatinine has returned to normal.   - Stop IV fluids. - Ready for transfer.  - PCCM will sign off.

## 2022-03-15 NOTE — Progress Notes (Signed)
NAME:  Katie Weber, MRN:  [id number], DOB:  [birth year], LOS: 2 ADMISSION DATE:  03/13/2022 CONSULTATION DATE:  03/14/2022 REFERRING MD:  Marlou Starks - CCS CHIEF COMPLAINT:  Abdominal pain  History of Present Illness:  76 year old woman who presented to [facility] 6/5 for abdominal pain after recent laparoscopic cholecystectomy (03/10/2022). PMHx significant for HLD, hypothyroidism, GERD with esophageal stenosis (s/p dilatation), nephrolithiasis, diverticulitis, arthritis, osteoporosis.  Patient initially presented to [facility] ED 6/4 for LUQ pain radiating to L chest after being instructed by her surgeon's office. Labs were notable for mild leukocytosis, CT A/P demonstrated mild stranding in the cholecystectomy bed and trace free fluid without significant etiology for LUQ pain. She was discharged from the ED but re-presented 6/5 after abdominal pain continued to worsen. CT A/P 6/6 demonstrated increased L > R upper abdominal fluid with loculation, including to L perihepatic space with c/f delayed bile leak vs. small volume postoperative hemorrhage. CCS admitted for further workup.  Rapid Response was called 6/6 for worsening patient condition, as she was diaphoretic, tachycardic to 100s and tachypneic to 40s with worsening pain and relative hypotension (BP 88/64). Given CT results and c/f bile leak from cystic duct stump, patient was taken back to OR for diagnostic laparoscopy +/- exploratory laparotomy 6/6 (Dr. Marlou Starks). A small bile leak in the liver bed was noted and was cauterized and a drain was left in place.  PCCM consulted for ICU admission and management.  Pertinent Medical History:   Past Medical History:  Diagnosis Date   Arthritis    arthritis fingers   Coronary artery calcification 04/24/2017   Diverticulitis    x2 -last 1'17 tx. outpatient.   Environmental and seasonal allergies    2014 Possibly related to air freshners.   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   Family history of anesthesia complication 70JJK ago   daughter had an allergic reaction to atropine   GERD (gastroesophageal reflux disease)    History of hiatal hernia 09/14/2014   History of kidney stones    '80's lithotripsy-UVA x1   Hyperlipidemia    Hypothyroidism    Pneumonia 2014   Pure hypercholesterolemia    Tremor of left hand    Significant Hospital Events: Including procedures, antibiotic start and stop dates in addition to other pertinent events   6/2 - Lap chole (Dr. Marlou Starks) 6/4 - Presented to ED for abdominal pain, CT grossly unremarkable. Discharged home. 6/6 - Re-presented or worsening abdominal pain. Became diaphoretic and hypotensive/tachycardic/tachypneic. Rapid Response called. Repeat CT A/P with increased fluid in upper quadrants c/f bile leak vs. Hemorrhage. Taken back to OR for diagnostic lap. Found to have liver bed bile leak, cauterized and drain placed. PCCM consulted.  Interim History / Subjective:   MAPs overnight between 60-65. No pressor requirement.   This morning, Katie Weber states she is feeling much better.  In particular, her abdominal pain has significantly subsided and she has been able to tolerate drinking water.  She denies any dizziness, chest pain.  She notes that her blood pressure at home is generally systolic between 093-818 and diastolic around 70.  She has never had low blood pressure before.  Objective:  Blood pressure (!) 85/51, pulse 81, temperature 98.2 F (36.8 C), temperature source Oral, resp. rate 14, height 5' 2.5" (1.588 m), weight 78 kg, SpO2 99 %.        Intake/Output Summary (Last 24 hours) at 03/15/2022 [phone]  Last data filed at 03/15/2022 0600 Gross per 24 hour  Intake 4987.8 ml  Output 515 ml  Net 4472.8 ml    Filed Weights   03/14/22 1622  Weight: 78 kg   Physical Examination: General: Acutely ill-appearing elderly woman in NAD. HEENT: [place]/AT, anicteric sclera, PERRL, very dry mucous  membranes. Neuro: Awake, alert and oriented x4.  No focal deficits noted. CV: Regular rate and rhythm with no murmurs, rubs, or gallops PULM: Diminished breath sounds in the bases, but no wheezing, Rales or rhonchi.  No increased work of breathing. GI: Soft, mildly distended, with minimal tenderness of the right upper quadrant. Lap incisions clean/dry/intact. Blake drain x 1 to RUQ with scant bile-tinged serosanguineous output. Hypoactive bowel sounds. Extremities: No edema noted. Skin: Warm/dry, no rashes.  Resolved Hospital Problem List:    Assessment & Plan:   Hypotension Shock, Distributive (resolved) Initially on presentation, I suspect the patient was in shock secondary to peritonitis with bile leak.  Her lactic acid levels improved significantly with IV fluid resuscitation.  At this time, I suspect her hypotension is primarily secondary to profound hypovolemia.  Although she has received approximately 6 L since admission, we have not yet quite reached euvolemic state.   - Change NS to LR, increase rate to 125 cc/hr - 500 cc LR bolus - Goal MAP > 65  NAGMA AGMA Initially on presentation, patient had lactic acidosis, however lactic acid levels have improved but bicarb levels remain unchanged at 18.  Anion gap has closed.  I suspect current NAGMA may be secondary to normal saline infusion.  Due to this, will change to LR.  We will also check a lactic acid to ensure continued resolution.  - Change NS to LR - Lactic acid level pending  Bile Leak s/p diagnostic laparoscopy 6/6 History of recent laparoscopic cholecystectomy 6/2 - Continue ceftriaxone/Flagyl, stop date per CCS - Postoperative wound care/drain management per CCS - Monitor drain output - Pain control per CCS  Acute respiratory insufficiency Suspect this is secondary to atelectasis and poor inspiratory effort due to peritonitis.  - Continue supplemental O2 support - Wean O2 for sat > 90% - Pulmonary hygiene -  Encourage mobility once appropriate from a postsurgical standpoint  Acute Kidney Injury  -Likely pre-renal given critical illness, however hypotension may have lead to ATN as well. Creatinine with slight improvement today, however minimal urine output documented.   - Trend BMP closely  - Continue IVF resuscitation  - Replete electrolytes as indicated - Monitor I&Os - Avoid nephrotoxic agents as able - Ensure adequate renal perfusion  Acute blood loss Anemia Hemoglobin decreased post-op.  - Trend CBC while admitted  - Transfuse for hemoglobin < 7  GERD History of esophageal stenosis, s/p dilatation - PPI - SLP evaluation if any c/f dysphagia post-extubation  Hypothyroidism - Continue levothyroxine  Best Practice: (right click and "Reselect all SmartList Selections" daily)   Diet/type: clear liquids DVT prophylaxis: LMWH GI prophylaxis: PPI Lines: N/A Foley:  N/A Code Status:  full code Last date of multidisciplinary goals of care discussion [Per Primary]     Dr. Jose Persia Internal Medicine PGY-3  03/15/2022, 7:40 AM

## 2022-03-15 NOTE — Assessment & Plan Note (Signed)
Improving post-operatively  - Advance diet.

## 2022-03-15 NOTE — Progress Notes (Signed)
1 Day Post-Op   Subjective/Chief Complaint: No complaints. Feels much better today   Objective: Vital signs in last 24 hours: Temp:  [96.4 F (35.8 C)-98.9 F (37.2 C)] 98.2 F (36.8 C) (06/07 0400) Pulse Rate:  [81-111] 81 (06/07 0600) Resp:  [8-40] 14 (06/07 0600) BP: (68-104)/(51-73) 85/51 (06/07 0600) SpO2:  [90 %-100 %] 99 % (06/07 0600) Weight:  [78 kg] 78 kg (06/06 1622)    Intake/Output from previous day: 06/06 0701 - 06/07 0700 In: 4987.8 [I.V.:3849.7; IV Piggyback:1138.1] Out: 515 [Urine:345; Drains:160; Blood:10] Intake/Output this shift: No intake/output data recorded.  General appearance: alert and cooperative Resp: clear to auscultation bilaterally Cardio: regular rate and rhythm GI: soft, mild tenderness. Drain output small  Lab Results:  Recent Labs    03/14/22 1541 03/15/22 0039  WBC 3.9* 7.2  HGB 13.6 11.3*  HCT 44.5 36.6  PLT 251 249   BMET Recent Labs    03/14/22 1806 03/15/22 0039  NA 140 140  K 3.7 3.9  CL 107 112*  CO2 18* 18*  GLUCOSE 101* 87  BUN 30* 34*  CREATININE 2.40* 2.04*  CALCIUM 8.4* 8.0*   PT/INR No results for input(s): LABPROT, INR in the last 72 hours. ABG No results for input(s): PHART, HCO3 in the last 72 hours.  Invalid input(s): PCO2, PO2  Studies/Results: CT ABDOMEN PELVIS WO CONTRAST  Result Date: 03/14/2022 CLINICAL DATA:  Laparoscopic cholecystectomy 4 days ago.  Pain. EXAM: CT ABDOMEN AND PELVIS WITHOUT CONTRAST TECHNIQUE: Multidetector CT imaging of the abdomen and pelvis was performed following the standard protocol without IV contrast. RADIATION DOSE REDUCTION: This exam was performed according to the departmental dose-optimization program which includes automated exposure control, adjustment of the mA and/or kV according to patient size and/or use of iterative reconstruction technique. COMPARISON:  03/12/2022 FINDINGS: Lower chest: Left greater than right base atelectasis, increased. Normal heart size  with multivessel coronary artery calcification. New trace left pleural fluid. Moderate hiatal hernia. Hepatobiliary: Normal liver. Cholecystectomy. No biliary duct dilatation or calcified common duct stone. Pancreas: Normal, without mass or ductal dilatation. 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 Adrenals/Urinary Tract: Mild left adrenal thickening. Normal right adrenal gland. Punctate bilateral renal collecting system calculi. Interpolar right renal exophytic anterior 4.4 cm low-density lesion is likely a cyst or minimally complex cyst . In the absence of clinically indicated signs/symptoms require(s) no independent follow-up. No hydroureter or ureteric calculi. No bladder calculi. Stomach/Bowel: Normal remainder of the stomach. Scattered colonic diverticula. Normal terminal ileum and appendix. Normal small bowel. Vascular/Lymphatic: Aortic atherosclerosis. No abdominopelvic adenopathy. Reproductive: Normal uterus. Right ovarian 1.8 cm fluid density lesion is consistent with a residual follicle or cyst. Other: Small volume pelvic fluid, slightly increased on 82/3. Small volume loculated ascites is identified within the left perihepatic space and extending into the left upper quadrant. Example 16/3. New. Contiguous increased perisplenic fluid including on 27/3, increased. Small volume fluid is also identified within either the alesser sac or gastrosplenic ligament including at 3.8 x 4.6 cm on 37/3, new. Omental edema is similar with minimal new ascites within the right lateral abdomen on 48/3. No free intraperitoneal air. Anterior abdominal wall subcutaneous gas is likely postoperative, small volume. Musculoskeletal: No acute osseous abnormality. IMPRESSION: 1. Since the immediate postoperative CT of 03/12/2022, increase in left-greater-than-right upper abdominal fluid with areas of loculation including in the left perihepatic space. Considerations include a somewhat atypical distribution of delayed bile leak  versus small volume postoperative hemorrhage. Consider sampling or nuclear medicine HIDA scan.  2. No other explanation for pain. 3. New trace left pleural fluid with progressive bibasilar atelectasis. 4. Moderate hiatal hernia. 5. Bilateral nephrolithiasis 6. Coronary artery atherosclerosis. Aortic Atherosclerosis (ICD10-I70.0). Electronically Signed   By: Abigail Miyamoto M.D.   On: 03/14/2022 11:57   DG Chest Port 1 View  Result Date: 03/15/2022 CLINICAL DATA:  Hypoxia EXAM: PORTABLE CHEST 1 VIEW COMPARISON:  March 14, 2022 FINDINGS: The heart size and mediastinal contours are within normal limits. The lung volumes are low. Patchy consolidation of left lung base is identified. Minimal pleural fluid is identified in the minor fissure. The visualized skeletal structures are unremarkable. IMPRESSION: Left lung base pneumonia. Electronically Signed   By: Abelardo Diesel M.D.   On: 03/15/2022 07:17   DG CHEST PORT 1 VIEW  Result Date: 03/14/2022 CLINICAL DATA:  Hypoxia. EXAM: PORTABLE CHEST 1 VIEW COMPARISON:  Radiograph March 12, 2022. FINDINGS: Enlarged cardiac silhouette with central vascular prominence. Moderate hiatal hernia. Low lung volumes with bibasilar atelectasis. Trace left pleural effusion. The visualized skeletal structures are unchanged. IMPRESSION: Low lung volumes with bibasilar atelectasis and a trace left pleural effusion. Electronically Signed   By: Dahlia Bailiff M.D.   On: 03/14/2022 17:24    Anti-infectives: Anti-infectives (From admission, onward)    Start     Dose/Rate Route Frequency Ordered Stop   03/15/22 0900  cefTRIAXone (ROCEPHIN) 2 g in sodium chloride 0.9 % 100 mL IVPB        2 g 200 mL/hr over 30 Minutes Intravenous Every 24 hours 03/14/22 1541 03/20/22 0859   03/14/22 1630  metroNIDAZOLE (FLAGYL) IVPB 500 mg        500 mg 100 mL/hr over 60 Minutes Intravenous Every 8 hours 03/14/22 1541 03/19/22 1629   03/14/22 0945  cefTRIAXone (ROCEPHIN) 2 g in sodium chloride 0.9 % 100 mL  IVPB  Status:  Discontinued        2 g 200 mL/hr over 30 Minutes Intravenous Every 24 hours 03/14/22 0850 03/14/22 1545   03/14/22 0945  metroNIDAZOLE (FLAGYL) IVPB 500 mg  Status:  Discontinued        500 mg 100 mL/hr over 60 Minutes Intravenous Every 12 hours 03/14/22 0850 03/14/22 1545       Assessment/Plan: s/p Procedure(s): LAPAROSCOPY DIAGNOSTIC  WITH IRRIGATION AND  DRAINAGE OF BILEOMA (N/A) Advance diet AKI. Cr improving. Will leave foley in 1 more day Continue to monitor in icu today. If she continues to do this well then will transfer to floor tomorrow Continue rocephin/flagyl for bile leak Continue drain Lovenox for vte prophylaxis Appreciate CCM assistance  LOS: 2 days    Katie Weber 03/15/2022

## 2022-03-15 NOTE — Assessment & Plan Note (Signed)
Status post laparoscopy and drainage of bileoma.  Scan JP drainage.  - Continue antibiotics - Drain management per CCS

## 2022-03-15 NOTE — Assessment & Plan Note (Addendum)
Blood pressure has improved.   - Stop IV fluids progress ambulation.

## 2022-03-15 NOTE — Assessment & Plan Note (Signed)
Presently well controled

## 2022-03-15 NOTE — Progress Notes (Signed)
Dr. Marlou Starks paged with regards to BP 87/53 (64). Pt. Mentating appropriately, no signs of distress. BP soft overnight. Dr. Marlou Starks notified of assessment findings, no new orders.

## 2022-03-16 DIAGNOSIS — R0689 Other abnormalities of breathing: Secondary | ICD-10-CM | POA: Diagnosis not present

## 2022-03-16 LAB — CBC WITH DIFFERENTIAL/PLATELET
Abs Immature Granulocytes: 1.5 10*3/uL — ABNORMAL HIGH (ref 0.00–0.07)
Basophils Absolute: 0 10*3/uL (ref 0.0–0.1)
Basophils Relative: 0 %
Eosinophils Absolute: 0 10*3/uL (ref 0.0–0.5)
Eosinophils Relative: 0 %
HCT: 30.1 % — ABNORMAL LOW (ref 36.0–46.0)
Hemoglobin: 9.9 g/dL — ABNORMAL LOW (ref 12.0–15.0)
Immature Granulocytes: 12 %
Lymphocytes Relative: 5 %
Lymphs Abs: 0.6 10*3/uL — ABNORMAL LOW (ref 0.7–4.0)
MCH: 28.1 pg (ref 26.0–34.0)
MCHC: 32.9 g/dL (ref 30.0–36.0)
MCV: 85.5 fL (ref 80.0–100.0)
Monocytes Absolute: 0.7 10*3/uL (ref 0.1–1.0)
Monocytes Relative: 6 %
Neutro Abs: 9.7 10*3/uL — ABNORMAL HIGH (ref 1.7–7.7)
Neutrophils Relative %: 77 %
Platelets: 216 10*3/uL (ref 150–400)
RBC: 3.52 MIL/uL — ABNORMAL LOW (ref 3.87–5.11)
RDW: 16.3 % — ABNORMAL HIGH (ref 11.5–15.5)
WBC: 12.5 10*3/uL — ABNORMAL HIGH (ref 4.0–10.5)
nRBC: 0.2 % (ref 0.0–0.2)

## 2022-03-16 LAB — BASIC METABOLIC PANEL
Anion gap: 3 — ABNORMAL LOW (ref 5–15)
BUN: 34 mg/dL — ABNORMAL HIGH (ref 8–23)
CO2: 24 mmol/L (ref 22–32)
Calcium: 8.1 mg/dL — ABNORMAL LOW (ref 8.9–10.3)
Chloride: 111 mmol/L (ref 98–111)
Creatinine, Ser: 0.76 mg/dL (ref 0.44–1.00)
GFR, Estimated: >60 mL/min (ref >60)
Glucose, Bld: 108 mg/dL — ABNORMAL HIGH (ref 70–99)
Potassium: 3.8 mmol/L (ref 3.5–5.1)
Sodium: 138 mmol/L (ref 135–145)

## 2022-03-16 MED ORDER — STERILE WATER FOR INJECTION IJ SOLN
INTRAMUSCULAR | Status: AC
  Filled 2022-03-16: qty 10

## 2022-03-16 MED ORDER — ENOXAPARIN SODIUM 40 MG/0.4ML IJ SOSY
40.0000 mg | PREFILLED_SYRINGE | INTRAMUSCULAR | Status: DC
  Administered 2022-03-16 – 2022-03-17 (×2): 40 mg via SUBCUTANEOUS
  Filled 2022-03-16 (×2): qty 0.4

## 2022-03-16 MED ORDER — DOCUSATE SODIUM 100 MG PO CAPS
100.0000 mg | ORAL_CAPSULE | Freq: Two times a day (BID) | ORAL | Status: DC
  Administered 2022-03-16 – 2022-03-17 (×2): 100 mg via ORAL
  Filled 2022-03-16 (×2): qty 1

## 2022-03-16 NOTE — Progress Notes (Signed)
2 Days Post-Op   Subjective/Chief Complaint: Feels better. Has a little more abd pain today but different than before surgery   Objective: Vital signs in last 24 hours: Temp:  [97.5 F (36.4 C)-98.4 F (36.9 C)] 97.5 F (36.4 C) (06/08 0725) Pulse Rate:  [66-86] 73 (06/08 0700) Resp:  [15-25] 23 (06/08 0700) BP: (87-110)/(45-92) 110/92 (06/08 0700) SpO2:  [90 %-95 %] 94 % (06/08 0700) Last BM Date :  (PTA)  Intake/Output from previous day: 06/07 0701 - 06/08 0700 In: 3746.1 [P.O.:200; I.V.:3146.3; IV Piggyback:399.9] Out: 1440 [Urine:1400; Drains:40] Intake/Output this shift: No intake/output data recorded.  General appearance: alert and cooperative Resp: clear to auscultation bilaterally Cardio: regular rate and rhythm GI: soft, appropriately tender  Lab Results:  Recent Labs    03/15/22 0039 03/16/22 0424  WBC 7.2 12.5*  HGB 11.3* 9.9*  HCT 36.6 30.1*  PLT 249 216   BMET Recent Labs    03/15/22 0039 03/16/22 0424  NA 140 138  K 3.9 3.8  CL 112* 111  CO2 18* 24  GLUCOSE 87 108*  BUN 34* 34*  CREATININE 2.04* 0.76  CALCIUM 8.0* 8.1*   PT/INR No results for input(s): "LABPROT", "INR" in the last 72 hours. ABG No results for input(s): "PHART", "HCO3" in the last 72 hours.  Invalid input(s): "PCO2", "PO2"  Studies/Results: DG Chest Port 1 View  Result Date: 03/15/2022 CLINICAL DATA:  Hypoxia EXAM: PORTABLE CHEST 1 VIEW COMPARISON:  March 14, 2022 FINDINGS: The heart size and mediastinal contours are within normal limits. The lung volumes are low. Patchy consolidation of left lung base is identified. Minimal pleural fluid is identified in the minor fissure. The visualized skeletal structures are unremarkable. IMPRESSION: Left lung base pneumonia. Electronically Signed   By: Abelardo Diesel M.D.   On: 03/15/2022 07:17   DG CHEST PORT 1 VIEW  Result Date: 03/14/2022 CLINICAL DATA:  Hypoxia. EXAM: PORTABLE CHEST 1 VIEW COMPARISON:  Radiograph March 12, 2022.  FINDINGS: Enlarged cardiac silhouette with central vascular prominence. Moderate hiatal hernia. Low lung volumes with bibasilar atelectasis. Trace left pleural effusion. The visualized skeletal structures are unchanged. IMPRESSION: Low lung volumes with bibasilar atelectasis and a trace left pleural effusion. Electronically Signed   By: Dahlia Bailiff M.D.   On: 03/14/2022 17:24   CT ABDOMEN PELVIS WO CONTRAST  Result Date: 03/14/2022 CLINICAL DATA:  Laparoscopic cholecystectomy 4 days ago.  Pain. EXAM: CT ABDOMEN AND PELVIS WITHOUT CONTRAST TECHNIQUE: Multidetector CT imaging of the abdomen and pelvis was performed following the standard protocol without IV contrast. RADIATION DOSE REDUCTION: This exam was performed according to the departmental dose-optimization program which includes automated exposure control, adjustment of the mA and/or kV according to patient size and/or use of iterative reconstruction technique. COMPARISON:  03/12/2022 FINDINGS: Lower chest: Left greater than right base atelectasis, increased. Normal heart size with multivessel coronary artery calcification. New trace left pleural fluid. Moderate hiatal hernia. Hepatobiliary: Normal liver. Cholecystectomy. No biliary duct dilatation or calcified common duct stone. Pancreas: Normal, without mass or ductal dilatation. 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 Adrenals/Urinary Tract: Mild left adrenal thickening. Normal right adrenal gland. Punctate bilateral renal collecting system calculi. Interpolar right renal exophytic anterior 4.4 cm low-density lesion is likely a cyst or minimally complex cyst . In the absence of clinically indicated signs/symptoms require(s) no independent follow-up. No hydroureter or ureteric calculi. No bladder calculi. Stomach/Bowel: Normal remainder of the stomach. Scattered colonic diverticula. Normal terminal ileum and appendix. Normal small bowel. Vascular/Lymphatic: Aortic atherosclerosis.  No abdominopelvic  adenopathy. Reproductive: Normal uterus. Right ovarian 1.8 cm fluid density lesion is consistent with a residual follicle or cyst. Other: Small volume pelvic fluid, slightly increased on 82/3. Small volume loculated ascites is identified within the left perihepatic space and extending into the left upper quadrant. Example 16/3. New. Contiguous increased perisplenic fluid including on 27/3, increased. Small volume fluid is also identified within either the alesser sac or gastrosplenic ligament including at 3.8 x 4.6 cm on 37/3, new. Omental edema is similar with minimal new ascites within the right lateral abdomen on 48/3. No free intraperitoneal air. Anterior abdominal wall subcutaneous gas is likely postoperative, small volume. Musculoskeletal: No acute osseous abnormality. IMPRESSION: 1. Since the immediate postoperative CT of 03/12/2022, increase in left-greater-than-right upper abdominal fluid with areas of loculation including in the left perihepatic space. Considerations include a somewhat atypical distribution of delayed bile leak versus small volume postoperative hemorrhage. Consider sampling or nuclear medicine HIDA scan. 2. No other explanation for pain. 3. New trace left pleural fluid with progressive bibasilar atelectasis. 4. Moderate hiatal hernia. 5. Bilateral nephrolithiasis 6. Coronary artery atherosclerosis. Aortic Atherosclerosis (ICD10-I70.0). Electronically Signed   By: Abigail Miyamoto M.D.   On: 03/14/2022 11:57    Anti-infectives: Anti-infectives (From admission, onward)    Start     Dose/Rate Route Frequency Ordered Stop   03/15/22 0900  cefTRIAXone (ROCEPHIN) 2 g in sodium chloride 0.9 % 100 mL IVPB        2 g 200 mL/hr over 30 Minutes Intravenous Every 24 hours 03/14/22 1541 03/20/22 0859   03/14/22 1630  metroNIDAZOLE (FLAGYL) IVPB 500 mg        500 mg 100 mL/hr over 60 Minutes Intravenous Every 8 hours 03/14/22 1541 03/19/22 1629   03/14/22 0945  cefTRIAXone (ROCEPHIN) 2 g in  sodium chloride 0.9 % 100 mL IVPB  Status:  Discontinued        2 g 200 mL/hr over 30 Minutes Intravenous Every 24 hours 03/14/22 0850 03/14/22 1545   03/14/22 0945  metroNIDAZOLE (FLAGYL) IVPB 500 mg  Status:  Discontinued        500 mg 100 mL/hr over 60 Minutes Intravenous Every 12 hours 03/14/22 0850 03/14/22 1545       Assessment/Plan: s/p Procedure(s): LAPAROSCOPY DIAGNOSTIC  WITH IRRIGATION AND  DRAINAGE OF BILEOMA (N/A) Advance diet. Allow soft diet Transfer to floor D/c foley Continue drain Continue rocephin/flagyl PT consult  LOS: 3 days    Katie Weber 03/16/2022

## 2022-03-16 NOTE — Evaluation (Signed)
Physical Therapy Evaluation Patient Details Name: Katie Weber MRN: [id number] DOB: [birth year] Today's Date: 03/16/2022  History of Present Illness  76 y.o. female presents to [facility] hospital on 03/14/2022 with abdominal pain. Pt recently underwent laparoscopic cholecystectomy on 6/2.  CT AP 6/6 demonstrated increased L > R upper abdominal fluid with loculation, including to L perihepatic space with c/f delayed bile leak vs. small volume postoperative hemorrhage. Pt underwent ex-lap with drainage of bileoma on 6/6. PMH includes OA, asthma, HLD, PNA, B TKA.  Clinical Impression  Pt presents to PT with deficits in endurance, strength, power, gait, balance. Pt is able to ambulate for household distances with support of a walker, fatiguing more quickly than at baseline. Pt will benefit from frequent mobilization in an effort to reduce pain and restore independence in mobility. PT recommends outpatient PT at this time, although the pt may progress quickly.     Recommendations for follow up therapy are one component of a multi-disciplinary discharge planning process, led by the attending physician.  Recommendations may be updated based on patient status, additional functional criteria and insurance authorization.  Follow Up Recommendations Outpatient PT    Assistance Recommended at Discharge PRN  Patient can return home with the following  A little help with walking and/or transfers;A little help with bathing/dressing/bathroom;Assistance with cooking/housework    Equipment Recommendations None recommended by PT  Recommendations for Other Services       Functional Status Assessment Patient has had a recent decline in their functional status and demonstrates the ability to make significant improvements in function in a reasonable and predictable amount of time.     Precautions / Restrictions Precautions Precautions: Fall Precaution Comments: JP drain Restrictions Weight Bearing  Restrictions: No      Mobility  Bed Mobility Overal bed mobility: Needs Assistance Bed Mobility: Sit to Supine       Sit to supine: Supervision        Transfers Overall transfer level: Needs assistance Equipment used: Rolling walker (2 wheels) Transfers: Sit to/from Stand Sit to Stand: Supervision                Ambulation/Gait Ambulation/Gait assistance: Supervision Gait Distance (Feet): 200 Feet Assistive device: Rolling walker (2 wheels) Gait Pattern/deviations: Step-through pattern Gait velocity: reduced Gait velocity interpretation: <1.8 ft/sec, indicate of risk for recurrent falls   General Gait Details: pt with slowed step-through gait  Stairs            Wheelchair Mobility    Modified Rankin (Stroke Patients Only)       Balance Overall balance assessment: Needs assistance Sitting-balance support: No upper extremity supported, Feet supported Sitting balance-Leahy Scale: Good     Standing balance support: Single extremity supported, Bilateral upper extremity supported, Reliant on assistive device for balance Standing balance-Leahy Scale: Poor                               Pertinent Vitals/Pain Pain Assessment Pain Assessment: 0-10 Pain Score: 5  Pain Location: L flank Pain Descriptors / Indicators: Cramping Pain Intervention(s): Patient requesting pain meds-RN notified    Home Living Family/patient expects to be discharged to:: Private residence Living Arrangements: Spouse/significant other Available Help at Discharge: Family;Available 24 hours/day Type of Home: House Home Access: Stairs to enter Entrance Stairs-Rails: Can reach both Entrance Stairs-Number of Steps: 1   Home Layout: Able to live on main level with bedroom/bathroom Home Equipment: Conservation officer, nature (2 wheels);Shower seat;Cane -  single point;Toilet riser;Wheelchair - manual      Prior Function Prior Level of Function : Independent/Modified Independent              Mobility Comments: enjoys walking       Hand Dominance   Dominant Hand: Right    Extremity/Trunk Assessment   Upper Extremity Assessment Upper Extremity Assessment: Overall WFL for tasks assessed    Lower Extremity Assessment Lower Extremity Assessment: Overall WFL for tasks assessed    Cervical / Trunk Assessment Cervical / Trunk Assessment: Normal  Communication   Communication: No difficulties  Cognition Arousal/Alertness: Awake/alert Behavior During Therapy: WFL for tasks assessed/performed Overall Cognitive Status: Within Functional Limits for tasks assessed                                          General Comments General comments (skin integrity, edema, etc.): VSS on pt on 6L [place] during session    Exercises     Assessment/Plan    PT Assessment Patient needs continued PT services  PT Problem List Decreased activity tolerance;Decreased balance;Decreased mobility;Decreased knowledge of precautions;Pain       PT Treatment Interventions DME instruction;Gait training;Stair training;Functional mobility training;Therapeutic activities;Therapeutic exercise;Balance training;Neuromuscular re-education;Patient/family education    PT Goals (Current goals can be found in the Care Plan section)  Acute Rehab PT Goals Patient Stated Goal: to return to independence and regain strength/endurance PT Goal Formulation: With patient/family Time For Goal Achievement: 03/30/22 Potential to Achieve Goals: Good    Frequency Min 3X/week     Co-evaluation               AM-PAC PT "6 Clicks" Mobility  Outcome Measure Help needed turning from your back to your side while in a flat bed without using bedrails?: A Little Help needed moving from lying on your back to sitting on the side of a flat bed without using bedrails?: A Little Help needed moving to and from a bed to a chair (including a wheelchair)?: A Little Help needed standing up from a  chair using your arms (e.g., wheelchair or bedside chair)?: A Little Help needed to walk in hospital room?: A Little Help needed climbing 3-5 steps with a railing? : Total 6 Click Score: 16    End of Session Equipment Utilized During Treatment: Oxygen Activity Tolerance: Patient tolerated treatment well Patient left: in bed;with call bell/phone within reach;with bed alarm set;with family/visitor present Nurse Communication: Mobility status PT Visit Diagnosis: Other abnormalities of gait and mobility (R26.89)    Time: 3276-1470 PT Time Calculation (min) (ACUTE ONLY): 32 min   Charges:   PT Evaluation $PT Eval Low Complexity: [place], PT, DPT Acute Rehabilitation Office [phone]   Zenaida Niece 03/16/2022, 4:33 PM

## 2022-03-16 NOTE — Progress Notes (Signed)
NAME:  Katie Weber, MRN:  [id number], DOB:  1945-12-04, LOS: 3 ADMISSION DATE:  03/13/2022 CONSULTATION DATE:  03/14/2022 REFERRING MD:  Marlou Starks - [facility] CHIEF COMPLAINT:  Abdominal pain  History of Present Illness:  76 year old woman who presented to [facility] 6/5 for abdominal pain after recent laparoscopic cholecystectomy (03/10/2022). PMHx significant for HLD, hypothyroidism, GERD with esophageal stenosis (s/p dilatation), nephrolithiasis, diverticulitis, arthritis, osteoporosis.  Patient initially presented to [facility] ED 6/4 for LUQ pain radiating to L chest after being instructed by her surgeon's office. Labs were notable for mild leukocytosis, CT A/P demonstrated mild stranding in the cholecystectomy bed and trace free fluid without significant etiology for LUQ pain. She was discharged from the ED but re-presented 6/5 after abdominal pain continued to worsen. CT A/P 6/6 demonstrated increased L > R upper abdominal fluid with loculation, including to L perihepatic space with c/f delayed bile leak vs. small volume postoperative hemorrhage. [facility]CS admitted for further workup.  Rapid Response was called 6/6 for worsening patient condition, as she was diaphoretic, tachycardic to 100s and tachypneic to 40s with worsening pain and relative hypotension (BP 88/64). Given CT results and c/f bile leak from cystic duct stump, patient was taken back to OR for diagnostic laparoscopy +/- exploratory laparotomy 6/6 (Dr. Marlou Starks). A small bile leak in the liver bed was noted and was cauterized and a drain was left in place.  PCCM consulted for ICU admission and management.  Pertinent Medical History:   Past Medical History:  Diagnosis Date   Arthritis    arthritis fingers   Coronary artery calcification 04/24/2017   Diverticulitis    x2 -last 1'17 tx. outpatient.   Environmental and seasonal allergies    2014 Possibly related to air freshners.   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   Family history of anesthesia complication 19JYN ago   daughter had an allergic reaction to atropine   GERD (gastroesophageal reflux disease)    History of hiatal hernia 09/14/2014   History of kidney stones    '80's lithotripsy-[facility] x1   Hyperlipidemia    Hypothyroidism    Pneumonia 2014   Pure hypercholesterolemia    Tremor of left hand    Significant Hospital Events: Including procedures, antibiotic start and stop dates in addition to other pertinent events   6/2 - Lap chole (Dr. Marlou Starks) 6/4 - Presented to ED for abdominal pain, CT grossly unremarkable. Discharged home. 6/6 - Re-presented or worsening abdominal pain. Became diaphoretic and hypotensive/tachycardic/tachypneic. Rapid Response called. Repeat CT A/P with increased fluid in upper quadrants c/f bile leak vs. Hemorrhage. Taken back to OR for diagnostic lap. Found to have liver bed bile leak, cauterized and drain placed. PCCM consulted.  Interim History / Subjective:   BP improving gradually. Supplemental O2 requirement improving.  Creatinine returned to baseline.   Katie Weber states she is feeling good overall. She is only experiencing abdominal pain with palpation. She was able to eat french toast this morning without difficulty. She feels as though her heart is racing but denies chest pain, SOB.  Objective:  Blood pressure 110/77, pulse 74, temperature (!) 97.5 F (36.4 C), temperature source Oral, resp. rate (!) 25, height 5' 2.5" (1.588 m), weight 78 kg, SpO2 95 %.        Intake/Output Summary (Last 24 hours) at 03/16/2022 0823 Last data filed at 03/16/2022 0800 Gross per 24 hour  Intake 3785.26 ml  Output  1440 ml  Net 2345.26 ml    Filed Weights   03/14/22 1622  Weight: 78 kg   Physical Examination: General: Acutely ill-appearing elderly woman in NAD. HEENT: Reading/AT, anicteric sclera, PERRL, very dry mucous membranes. Neuro: Awake, alert and oriented x4.  No focal deficits  noted. CV: Regular rate and rhythm with no murmurs, rubs, or gallops. Sinus rhythm per telemetry.  PULM: Diminished breath sounds in the bases, but no wheezing, Rales or rhonchi.  No increased work of breathing. GI: Soft, mildly distended, with minimal tenderness of the right upper quadrant.  Extremities: No edema noted. Skin: Warm/dry, no rashes.  Resolved Hospital Problem List:  NAGMA AGMA  Assessment & Plan:   Hypotension Shock, Distributive (resolved) Improving overall.   - Continue LR @ 125 cc/hr  - Goal MAP > 65  Bile Leak s/p diagnostic laparoscopy 6/6 History of recent laparoscopic cholecystectomy 6/2 - Continue ceftriaxone/Flagyl, stop date per CCS - Postoperative wound care/drain management per CCS - Monitor drain output - Pain control per CCS  Acute respiratory insufficiency 2/2 Atelectasis  - Continue supplemental O2 support - Wean O2 for sat > 90% - Pulmonary hygiene - Encourage mobility once appropriate from a postsurgical standpoint  Acute Kidney Injury  Creatinine has returned to baseline with improvements in urine output.   - Trend BMP closely  - Continue IVF resuscitation  - Replete electrolytes as indicated - Monitor I&Os - Avoid nephrotoxic agents as able - Ensure adequate renal perfusion - Discontinue foley   Acute blood loss Anemia Hemoglobin decreased post-op. No indication for transfusion at this time.  - Trend CBC while admitted  - Transfuse for hemoglobin < 7  GERD History of esophageal stenosis, s/p dilatation - PPI  Hypothyroidism - Continue levothyroxine  Best Practice: (right click and "Reselect all SmartList Selections" daily)   Diet/type: clear liquids DVT prophylaxis: LMWH GI prophylaxis: PPI Lines: N/A Foley:  N/A Code Status:  full code Last date of multidisciplinary goals of care discussion [Per Primary]     Dr. Jose Persia Internal Medicine PGY-3  03/16/2022, 8:23 AM

## 2022-03-17 MED ORDER — PANTOPRAZOLE SODIUM 40 MG PO TBEC
40.0000 mg | DELAYED_RELEASE_TABLET | Freq: Every day | ORAL | Status: DC
Start: 2022-03-17 — End: 2022-03-18
  Administered 2022-03-17: 40 mg via ORAL
  Filled 2022-03-17: qty 1

## 2022-03-17 NOTE — Progress Notes (Signed)
3 Days Post-Op   Subjective/Chief Complaint: No complaints. Feels better   Objective: Vital signs in last 24 hours: Temp:  [98 F (36.7 C)-98.4 F (36.9 C)] 98 F (36.7 C) (06/08 1557) Pulse Rate:  [59-77] 60 (06/09 0700) Resp:  [15-28] 18 (06/09 0700) BP: (109-138)/(59-93) 131/72 (06/09 0700) SpO2:  [91 %-97 %] 97 % (06/09 0700) Last BM Date : 03/17/22  Intake/Output from previous day: 06/08 0701 - 06/09 0700 In: 1505.9 [P.O.:600; I.V.:505.9; IV Piggyback:400] Out: 450 [Urine:450] Intake/Output this shift: No intake/output data recorded.  General appearance: alert and cooperative Resp: clear to auscultation bilaterally Cardio: regular rate and rhythm GI: soft, mild tenderness. Minimal drain output  Lab Results:  Recent Labs    03/15/22 0039 03/16/22 0424  WBC 7.2 12.5*  HGB 11.3* 9.9*  HCT 36.6 30.1*  PLT 249 216   BMET Recent Labs    03/15/22 0039 03/16/22 0424  NA 140 138  K 3.9 3.8  CL 112* 111  CO2 18* 24  GLUCOSE 87 108*  BUN 34* 34*  CREATININE 2.04* 0.76  CALCIUM 8.0* 8.1*   PT/INR No results for input(s): "LABPROT", "INR" in the last 72 hours. ABG No results for input(s): "PHART", "HCO3" in the last 72 hours.  Invalid input(s): "PCO2", "PO2"  Studies/Results: No results found.  Anti-infectives: Anti-infectives (From admission, onward)    Start     Dose/Rate Route Frequency Ordered Stop   03/15/22 0900  cefTRIAXone (ROCEPHIN) 2 g in sodium chloride 0.9 % 100 mL IVPB        2 g 200 mL/hr over 30 Minutes Intravenous Every 24 hours 03/14/22 1541 03/20/22 0859   03/14/22 1630  metroNIDAZOLE (FLAGYL) IVPB 500 mg        500 mg 100 mL/hr over 60 Minutes Intravenous Every 8 hours 03/14/22 1541 03/19/22 1629   03/14/22 0945  cefTRIAXone (ROCEPHIN) 2 g in sodium chloride 0.9 % 100 mL IVPB  Status:  Discontinued        2 g 200 mL/hr over 30 Minutes Intravenous Every 24 hours 03/14/22 0850 03/14/22 1545   03/14/22 0945  metroNIDAZOLE (FLAGYL)  IVPB 500 mg  Status:  Discontinued        500 mg 100 mL/hr over 60 Minutes Intravenous Every 12 hours 03/14/22 0850 03/14/22 1545       Assessment/Plan: s/p Procedure(s): LAPAROSCOPY DIAGNOSTIC  WITH IRRIGATION AND  DRAINAGE OF BILEOMA (N/A) Advance diet Transfer to floor Continue rocephin and flagyl for total of 5 days or until d/c Continue drain. I will remove in a week or so Ambulate Hopefully home tomorrow  LOS: 4 days    [name] 03/17/2022

## 2022-03-17 NOTE — Evaluation (Signed)
Occupational Therapy Evaluation Patient Details Name: Katie Weber MRN: [id number] DOB: 1946-07-25 Today's Date: 03/17/2022   History of Present Illness 76 y.o. female presents to [facility] hospital on 03/14/2022 with abdominal pain. Pt recently underwent laparoscopic cholecystectomy on 6/2.  CT AP 6/6 demonstrated increased L > R upper abdominal fluid with loculation, including to L perihepatic space with c/f delayed bile leak vs. small volume postoperative hemorrhage. Pt underwent ex-lap with drainage of bileoma on 6/6. PMH includes OA, asthma, HLD, PNA, B TKA.   Clinical Impression   PTA pt lived independently with her husband, enjoyed walking, gardening and spending time with her grand children. Pt able to complete mobility with S @ RW level and LB ADL tasks with min A. VSS on RA throughout session (probe moved to ear); SpO2 94 during activity. Will follow acutely however do not anticipate need for OT follow up. Pt very appreciative.  Pt would like to follow up with PT at [facility].      Recommendations for follow up therapy are one component of a multi-disciplinary discharge planning process, led by the attending physician.  Recommendations may be updated based on patient status, additional functional criteria and insurance authorization.   Follow Up Recommendations  No OT follow up    Assistance Recommended at Discharge Intermittent Supervision/Assistance  Patient can return home with the following A little help with bathing/dressing/bathroom;Assistance with cooking/housework;Assist for transportation    Functional Status Assessment  Patient has had a recent decline in their functional status and demonstrates the ability to make significant improvements in function in a reasonable and predictable amount of time.  Equipment Recommendations  None recommended by OT    Recommendations for Other Services       Precautions / Restrictions Precautions Precautions:  Fall Precaution Comments: JP drain Restrictions Weight Bearing Restrictions: No      Mobility Bed Mobility Overal bed mobility: Needs Assistance Bed Mobility: Sit to Supine       Sit to supine: Supervision        Transfers Overall transfer level: Needs assistance Equipment used: Rolling walker (2 wheels) Transfers: Sit to/from Stand Sit to Stand: Supervision                  Balance Overall balance assessment: Needs assistance Sitting-balance support: No upper extremity supported, Feet supported Sitting balance-Leahy Scale: Good     Standing balance support: Single extremity supported, Bilateral upper extremity supported, Reliant on assistive device for balance Standing balance-Leahy Scale: Fair                             ADL either performed or assessed with clinical judgement   ADL Overall ADL's : Needs assistance/impaired     Grooming: Modified independent   Upper Body Bathing: Set up   Lower Body Bathing: Minimal assistance   Upper Body Dressing : Set up   Lower Body Dressing: Minimal assistance   Toilet Transfer: Ambulation;Rolling walker (2 wheels);Supervision/safety   Toileting- Water quality scientist and Hygiene: Supervision/safety       Functional mobility during ADLs: Rolling walker (2 wheels);Supervision/safety       Vision         Perception     Praxis      Pertinent Vitals/Pain Pain Assessment Pain Assessment: No/denies pain     Hand Dominance Right   Extremity/Trunk Assessment Upper Extremity Assessment Upper Extremity Assessment: Overall WFL for tasks assessed   Lower Extremity Assessment  Lower Extremity Assessment: Defer to PT evaluation   Cervical / Trunk Assessment Cervical / Trunk Assessment: Normal (abdominal surgery)   Communication Communication Communication: No difficulties   Cognition Arousal/Alertness: Awake/alert Behavior During Therapy: WFL for tasks assessed/performed Overall  Cognitive Status: Within Functional Limits for tasks assessed                                       General Comments  VSS on RA    Exercises Exercises: Other exercises Other Exercises Other Exercises: incentive spirometer x 5 - able to pull 1000 ml   Shoulder Instructions      Home Living Family/patient expects to be discharged to:: Private residence Living Arrangements: Spouse/significant other Available Help at Discharge: Family;Available 24 hours/day Type of Home: House Home Access: Stairs to enter [employer] of Steps: 1 Entrance Stairs-Rails: Can reach both Home Layout: Able to live on main level with bedroom/bathroom     Bathroom Shower/Tub: Teacher, early years/pre: Standard Bathroom Accessibility: Yes How Accessible: Accessible via walker Home Equipment: [place] (2 wheels);Shower seat;Cane - single point;Toilet riser;Wheelchair - manual;Grab bars - tub/shower          Prior Functioning/Environment Prior Level of Function : Independent/Modified Independent             Mobility Comments: enjoys walking; gardening adn spending time with her grandchildren          OT Problem List: Decreased strength;Decreased activity tolerance;Obesity      OT Treatment/Interventions: Self-care/ADL training;Therapeutic exercise;Energy conservation;DME and/or AE instruction;Therapeutic activities;Patient/family education    OT Goals(Current goals can be found in the care plan section) Acute Rehab OT Goals Patient Stated Goal: to return to prior level OT Goal Formulation: With patient Time For Goal Achievement: 03/31/22 Potential to Achieve Goals: Good  OT Frequency: Min 2X/week    Co-evaluation              AM-PAC OT "6 Clicks" Daily Activity     Outcome Measure Help from another person eating meals?: None Help from another person taking care of personal grooming?: None Help from another person toileting, which  includes using toliet, bedpan, or urinal?: A Little Help from another person bathing (including washing, rinsing, drying)?: A Little Help from another person to put on and taking off regular upper body clothing?: A Little Help from another person to put on and taking off regular lower body clothing?: A Little 6 Click Score: 20   End of Session Equipment Utilized During Treatment: Rolling walker (2 wheels) Nurse Communication: Mobility status;Other (comment) (no O2 needed)  Activity Tolerance: Patient tolerated treatment well Patient left: in chair;with call bell/phone within reach  OT Visit Diagnosis: Unsteadiness on feet (R26.81);Muscle weakness (generalized) (M62.81)                Time: 1962-2297 OT Time Calculation (min): 39 min Charges:  OT General Charges $OT Visit: 1 Visit OT Evaluation $OT Eval Moderate Complexity: 1 Mod OT Treatments $Self Care/Home Management : 23-37 mins  Maurie Boettcher, OT/L   Acute OT Clinical Specialist Acute Rehabilitation Services Pager [phone] Office [phone]   [facility] 03/17/2022, 5:15 PM

## 2022-03-17 NOTE — Plan of Care (Signed)

## 2022-03-18 NOTE — TOC Transition Note (Signed)
Transition of Care [facility]) - CM/SW Discharge Note   Patient Details  Name: Katie Weber MRN: [id number] Date of Birth: 25-Oct-1945  Transition of [facility] CM/SW Contact:  Bartholomew Crews, RN Phone Number: [phone] 03/18/2022, 8:55 AM   Clinical Narrative:     Patient to transition home today. OP rehab referral per previous RNCM. No further TOC needs identified at this time.   Final next level of care: OP Rehab Barriers to Discharge: No Barriers Identified   Patient Goals and CMS Choice        Discharge Placement                       Discharge Plan and Services                                     Social Determinants of Health (SDOH) Interventions     Readmission Risk Interventions     No data to display

## 2022-03-18 NOTE — Discharge Summary (Signed)
Physician Discharge Summary  Patient ID: Katie Weber MRN: [id number] DOB/AGE: April 07, 1946 76 y.o.  PCP: Leeroy Cha, MD  Admit date: 03/13/2022 Discharge date: 03/18/2022  Admission Diagnoses:  bile leak  Discharge Diagnoses:  same post lap washout and drain placement  Principal Problem:   Postoperative pain Active Problems:   Abdominal pain   Biloma following surgery   Hypovolemia due to dehydration   AKI (acute kidney injury) [facility])   Surgery:  laparoscopy and drain placement by Dr. Marlou Starks  Discharged Condition: improved  Hospital Course:   Had surgery to control gallbladder bed leak which was successful.  Drain left in place and appears serous at time of discharge  Consults: none  Significant Diagnostic Studies: none    Discharge Exam: Blood pressure 134/72, pulse 78, temperature 98.4 F (36.9 C), temperature source Oral, resp. rate 16, height 5' 2.5" (1.588 m), weight 78 kg, SpO2 94 %. Drainage serous.  Trocar sites look OK.   Disposition: Discharge disposition: 01-Home or Self Care       Discharge Instructions     Ambulatory referral to Physical Therapy   Complete by: As directed    Call MD for:  severe uncontrolled pain   Complete by: As directed    Diet - low sodium heart healthy   Complete by: As directed    Discharge instructions   Complete by: As directed    You may shower with JP.  Empty as needed or at least daily and record output.   Increase activity slowly   Complete by: As directed       Allergies as of 03/18/2022       Reactions   Contrast Media [iodinated Contrast Media] Shortness Of Breath, Other (See Comments)   Tightness in chest. Trouble breathing   Ciprofloxacin Itching, Rash   Itching and rash on arm/chest   Clindamycin/lincomycin Nausea Only   Penicillins Hives   .Marland KitchenHas patient had a PCN reaction causing immediate rash, facial/tongue/throat swelling, SOB or lightheadedness with hypotension: No Has patient  had a PCN reaction causing severe rash involving mucus membranes or skin necrosis: No Has patient had a PCN reaction that required hospitalization No Has patient had a PCN reaction occurring within the last 10 years: No If all of the above answers are "NO", then may proceed with Cephalosporin use.        Medication List     STOP taking these medications    Normal Saline Flush 0.9 % Soln       TAKE these medications    alendronate 70 MG tablet Commonly known as: FOSAMAX Take 70 mg by mouth once a week. Take with a full glass of water on an empty stomach. Take on Fridays   levothyroxine 100 MCG tablet Commonly known as: SYNTHROID Take 100 mcg by mouth daily before breakfast.   oxyCODONE 5 MG immediate release tablet Commonly known as: Roxicodone Take 1 tablet (5 mg total) by mouth every 6 (six) hours as needed for severe pain.   pantoprazole 40 MG tablet Commonly known as: PROTONIX Take 40 mg by mouth daily.   vitamin B-12 1000 MCG tablet Commonly known as: CYANOCOBALAMIN Take 1,000 mcg by mouth 3 (three) times a week.   vitamin C 500 MG tablet Commonly known as: ASCORBIC ACID Take 500 mg by mouth 3 (three) times a week.   Vitamin D 50 MCG (2000 UT) tablet Take 2,000 Units by mouth 3 (three) times a week.   zinc gluconate 50 MG tablet Take 50  mg by mouth 3 (three) times a week.        Follow-up Information     [name], MD Follow up in 2 week(s).   Specialty: General Surgery Why: See Dr. Marlou Starks in followup Contact information: [address] [place] [address] [phone]                 Signed: Pedro Earls 03/18/2022, 8:24 AM

## 2022-03-21 ENCOUNTER — Ambulatory Visit: Payer: Medicare Other | Admitting: Orthopaedic Surgery

## 2022-04-25 ENCOUNTER — Other Ambulatory Visit: Payer: Self-pay | Admitting: General Surgery

## 2022-04-25 ENCOUNTER — Other Ambulatory Visit (HOSPITAL_COMMUNITY): Payer: Self-pay | Admitting: General Surgery

## 2022-04-25 DIAGNOSIS — K8042 Calculus of bile duct with acute cholecystitis without obstruction: Secondary | ICD-10-CM

## 2022-04-25 DIAGNOSIS — R7989 Other specified abnormal findings of blood chemistry: Secondary | ICD-10-CM

## 2022-04-25 DIAGNOSIS — Z9049 Acquired absence of other specified parts of digestive tract: Secondary | ICD-10-CM

## 2022-04-26 ENCOUNTER — Other Ambulatory Visit (HOSPITAL_COMMUNITY): Payer: Self-pay | Admitting: General Surgery

## 2022-04-26 ENCOUNTER — Ambulatory Visit (HOSPITAL_COMMUNITY)
Admission: RE | Admit: 2022-04-26 | Discharge: 2022-04-26 | Disposition: A | Discharge status: Home / Self Care | Payer: Medicare Other | Source: Ambulatory Visit | Attending: General Surgery | Admitting: General Surgery

## 2022-04-26 DIAGNOSIS — R7989 Other specified abnormal findings of blood chemistry: Secondary | ICD-10-CM

## 2022-04-26 DIAGNOSIS — Z9049 Acquired absence of other specified parts of digestive tract: Secondary | ICD-10-CM

## 2022-04-26 DIAGNOSIS — K8042 Calculus of bile duct with acute cholecystitis without obstruction: Secondary | ICD-10-CM | POA: Insufficient documentation

## 2022-04-26 MED ORDER — GADOBUTROL 1 MMOL/ML IV SOLN
7.0000 mL | Freq: Once | INTRAVENOUS | Status: AC | PRN
  Administered 2022-04-26: 7 mL via INTRAVENOUS

## 2022-04-27 ENCOUNTER — Other Ambulatory Visit: Payer: Self-pay | Admitting: General Surgery

## 2022-04-27 DIAGNOSIS — R1084 Generalized abdominal pain: Secondary | ICD-10-CM

## 2022-05-25 ENCOUNTER — Ambulatory Visit: Payer: Medicare Other | Admitting: Physical Therapy

## 2022-05-29 ENCOUNTER — Encounter: Payer: Self-pay | Admitting: Physical Therapy

## 2022-05-29 ENCOUNTER — Ambulatory Visit (INDEPENDENT_AMBULATORY_CARE_PROVIDER_SITE_OTHER): Payer: Medicare Other | Admitting: Physical Therapy

## 2022-05-29 ENCOUNTER — Other Ambulatory Visit: Payer: Self-pay

## 2022-05-29 DIAGNOSIS — R2681 Unsteadiness on feet: Secondary | ICD-10-CM | POA: Diagnosis not present

## 2022-05-29 DIAGNOSIS — M6281 Muscle weakness (generalized): Secondary | ICD-10-CM

## 2022-05-29 DIAGNOSIS — R2689 Other abnormalities of gait and mobility: Secondary | ICD-10-CM | POA: Diagnosis not present

## 2022-05-29 DIAGNOSIS — M25661 Stiffness of right knee, not elsewhere classified: Secondary | ICD-10-CM

## 2022-05-29 DIAGNOSIS — M25561 Pain in right knee: Secondary | ICD-10-CM

## 2022-05-29 DIAGNOSIS — M5459 Other low back pain: Secondary | ICD-10-CM

## 2022-05-29 NOTE — Therapy (Signed)
OUTPATIENT PHYSICAL THERAPY THORACOLUMBAR EVALUATION   Patient Name: Katie Weber MRN: [id number] DOB:01-Jul-1946, 76 y.o., female Today's Date: 05/29/2022   PT End of Session - 05/29/22 1655     Visit Number 1    Number of Visits 16    Date for PT Re-Evaluation 07/21/22    Authorization Type Medicare Part A and B    Progress Note Due on Visit 10    PT Start Time 1100    PT Stop Time 1145    PT Time Calculation (min) 45 min    Activity Tolerance Patient tolerated treatment well    Behavior During Therapy [facility] for tasks assessed/performed             Past Medical History:  Diagnosis Date   Arthritis    arthritis fingers   Coronary artery calcification 04/24/2017   Diverticulitis    x2 -last 1'17 tx. outpatient.   Environmental and seasonal allergies    2014 Possibly related to air freshners.   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   Family history of anesthesia complication 97QBH ago   daughter had an allergic reaction to atropine   GERD (gastroesophageal reflux disease)    History of hiatal hernia 09/14/2014   History of kidney stones    '80's lithotripsy-[facility] x1   Hyperlipidemia    Hypothyroidism    Pneumonia 2014   Pure hypercholesterolemia    Tremor of left hand    Past Surgical History:  Procedure Laterality Date   BALLOON DILATION N/A 05/04/2014   Procedure: BALLOON DILATION;  Surgeon: Garlan Fair, MD;  Location: [facility];  Service: Endoscopy;  Laterality: N/A;   BREAST LUMPECTOMY Right 1972   benign    BREAST SURGERY Right    '72 -benign tumor   childbirth- NVD x2      CHOLECYSTECTOMY N/A 03/10/2022   Procedure: LAPAROSCOPIC CHOLECYSTECTOMY;  Surgeon: Jovita Kussmaul, MD;  Location: [place];  Service: General;  Laterality: N/A;   COLONOSCOPY WITH PROPOFOL N/A 05/04/2014   Procedure: COLONOSCOPY WITH PROPOFOL;  Surgeon: Garlan Fair, MD;  Location: [facility];  Service:  Endoscopy;  Laterality: N/A;   DILATION AND CURETTAGE OF UTERUS     04-20-14 [facility] hospital endometrial polyp removed.   ESOPHAGOGASTRODUODENOSCOPY (EGD) WITH PROPOFOL N/A 05/04/2014   Procedure: ESOPHAGOGASTRODUODENOSCOPY (EGD) WITH PROPOFOL;  Surgeon: Garlan Fair, MD;  Location: [facility];  Service: Endoscopy;  Laterality: N/A;. Procedure done in [place], [place]- 5 yrs ago.   HYSTEROSCOPY WITH D & C N/A 04/20/2014   Procedure: DILATATION AND CURETTAGE /HYSTEROSCOPY;  Surgeon: [name], MD;  Location: [facility];  Service: Gynecology;  Laterality: N/A;   IR PERC CHOLECYSTOSTOMY  01/08/2022   IR RADIOLOGIST EVAL & MGMT  02/10/2022   LAPAROSCOPY N/A 03/14/2022   Procedure: LAPAROSCOPY DIAGNOSTIC  WITH IRRIGATION AND  DRAINAGE OF Lucia Bitter;  Surgeon: Jovita Kussmaul, MD;  Location: [place];  Service: General;  Laterality: N/A;   LITHOTRIPSY     POLYPECTOMY  2020   Dr. Lear Ng   TOTAL KNEE ARTHROPLASTY Left 02/15/2021   Procedure: LEFT TOTAL KNEE ARTHROPLASTY;  Surgeon: Mcarthur Rossetti, MD;  Location: [facility];  Service: Orthopedics;  Laterality: Left;   TOTAL KNEE ARTHROPLASTY Right 08/09/2021   Procedure: RIGHT TOTAL KNEE ARTHROPLASTY;  Surgeon: Mcarthur Rossetti, MD;  Location: [facility];  Service: Orthopedics;  Laterality: Right;  Needs RNFA   TUBAL  LIGATION     Patient Active Problem List   Diagnosis Date Noted   Hypovolemia due to dehydration 03/15/2022   AKI (acute kidney injury) ([place]) 03/15/2022   Biloma following surgery 03/14/2022   Abdominal pain 03/13/2022   Postoperative pain 03/13/2022   Acute cholecystitis 70/26/3785   Biliary colic 88/50/2774   New onset atrial flutter ([place]) 01/07/2022   DJD (degenerative joint disease) of knee 08/09/2021   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   Unilateral primary osteoarthritis, right knee 06/06/2021   Status post total left knee replacement 02/15/2021   Unilateral primary osteoarthritis, left knee  01/04/2021   Coronary artery calcification 04/24/2017   Hyperlipidemia 04/24/2017   Acute bronchitis 09/29/2014   SBO (small bowel obstruction) ([place]) 09/27/2014   Nausea and vomiting 09/27/2014   Hypothyroidism 09/27/2014   Leukocytosis 09/27/2014    PCP: Leeroy Cha, MD  REFERRING PROVIDER: Jovita Kussmaul, MD  REFERRING DIAG: T81.89XA,K66.8 (ICD-10-CM) - Biloma following surgery, initial encounter G89.18 (ICD-10-CM) - Postoperative pain  Rationale for Evaluation and Treatment Rehabilitation  THERAPY DIAG:  Unsteadiness on feet  Other abnormalities of gait and mobility  Muscle weakness (generalized)  Other low back pain  Acute pain of right knee  Stiffness of right knee, not elsewhere classified  ONSET DATE: 03/17/22 Referral  SUBJECTIVE:                                                                                                                                                                                           SUBJECTIVE STATEMENT: Following the first surgery 6/2, she returned to the hospital with fluid in the chest and needed a second surgery and hospital stay on 6/6. Following that hospital stay she returned home 6/10 and needed a lot of help with walking (furniture walking) and stairs. She also had a mild COVID case and diverticulitis through these health concerns.  She lost over 20 lbs since before the hospital stay and put 7 lbs back on. She has an endoscopy next Monday to clear a small blockage from a UTI, and suspects it may have passed already.   The referral for physical therapy was to improve her conditioning and strength. She struggles to walk in the yard and limits her walking in the house. She can use stairs now, splitting her nights between sleeping in the bedroom upstairs and sleeping downstairs. She has increased back pain with prolonged standing, like cooking. She has increased Rt knee pain from starting driving again a few weeks ago.  She  took a half oxycodone for the first time since the surgeries last night and prior to PT. She takes  a foot bath 3x/week with epsom salts and magnesium that seems to help tremors.  PERTINENT HISTORY:  Cholecystectomy 03/10/22, laparoscopy 03/14/22 TKA Lt 5/22 Rt 11/22, hypothyroidism, hyperlipidemia, OA  PAIN:  Are you having pain? Yes: NPRS scale: 0/10, highest to 5-6/10 in last week Pain location: low back through middle and both sides Pain description: dull ache Aggravating factors: prolonged standing and activity Relieving factors: flexion, oxycodone  Are you having pain? Yes: NPRS scale: 0/10 at rest, highest 8/10 Pain location: Rt anterior knee and patella Pain description: soreness Aggravating factors: first getting up in the morning, bending Relieving factors: moving  PRECAUTIONS: None  WEIGHT BEARING RESTRICTIONS No  FALLS:  Has patient fallen in last 6 months? No  LIVING ENVIRONMENT: Lives with: lives with their spouse Lives in: House/apartment Stairs: Yes: Internal: 13 steps; on left going up and External: 2 steps; can reach both Has following equipment at home: Single point cane, Walker - 2 wheeled, Manufacturing engineer  OCCUPATION: retired, for leisure she enjoys hiking  PLOF: [place] return to activity level prior to surgery without pain   OBJECTIVE:   PATIENT SURVEYS:  FOTO 05/29/22 44%, target 55%  COGNITION: Overall cognitive status: Within functional limits for tasks assessed    SENSATION: Not tested  POSTURE: flexed trunk  and weight shift left  LUMBAR ROM:   Active A/PROM  eval  Flexion   Extension   Right lateral flexion   Left lateral flexion   Right rotation   Left rotation    (Blank rows = not tested)  LOWER EXTREMITY ROM:     ROM (A: AROM, P: PROM) Right Eval 05/29/22 Left Eval 05/29/22  Hip flexion    Hip extension    Hip abduction    Hip adduction    Hip internal rotation    Hip external rotation    Knee  flexion    Knee extension Seated LAQ A: -8* Seated LAQ WNL  Ankle dorsiflexion    Ankle plantarflexion    Ankle inversion    Ankle eversion     (Blank rows = not tested)  LOWER EXTREMITY MMT:    MMT Right Eval 05/29/22 Left Eval 05/29/22  Hip flexion    Hip extension    Hip abduction    Hip adduction    Hip internal rotation    Hip external rotation    Knee flexion Seated  4/5 painful Seated  4/5  Knee extension Seated  4/5 painful Seated  5/5  Ankle dorsiflexion    Ankle plantarflexion    Ankle inversion    Ankle eversion     (Blank rows = not tested)   FUNCTIONAL TESTS:  05/29/22 5 times sit to stand: 11.38 sec, no UE use  (cutoff for older adults is <12sec)  Functional gait assessment: 11/30   [facility] PT Assessment - 05/29/22 0001       Functional Gait  Assessment   Gait assessed  Yes    Gait Level Surface Walks 20 ft, slow speed, abnormal gait pattern, evidence for imbalance or deviates 10-15 in outside of the 12 in walkway width. Requires more than 7 sec to ambulate 20 ft.    Change in Gait Speed Makes only minor adjustments to walking speed, or accomplishes a change in speed with significant gait deviations, deviates 10-15 in outside the 12 in walkway width, or changes speed but loses balance but is able to recover and continue walking.    Gait with Horizontal Head Turns  Performs head turns with moderate changes in gait velocity, slows down, deviates 10-15 in outside 12 in walkway width but recovers, can continue to walk.    Gait with Vertical Head Turns Performs task with moderate change in gait velocity, slows down, deviates 10-15 in outside 12 in walkway width but recovers, can continue to walk.    Gait and Pivot Turn Pivot turns safely in greater than 3 sec and stops with no loss of balance, or pivot turns safely within 3 sec and stops with mild imbalance, requires small steps to catch balance.    Step Over Obstacle Is able to step over one shoe box (4.5 in total  height) but must slow down and adjust steps to clear box safely. May require verbal cueing.    Gait with Narrow Base of Support Ambulates less than 4 steps heel to toe or cannot perform without assistance.    Gait with Eyes Closed Walks 20 ft, slow speed, abnormal gait pattern, evidence for imbalance, deviates 10-15 in outside 12 in walkway width. Requires more than 9 sec to ambulate 20 ft.    Ambulating Backwards Walks 20 ft, uses assistive device, slower speed, mild gait deviations, deviates 6-10 in outside 12 in walkway width.    Steps Two feet to a stair, must use rail.    Total Score 11             GAIT: Distance walked: 200' Assistive device utilized: None Level of assistance: SBA Comments: shortened step length bil., flexed trunk, some M-L instability and near losses of balance  TODAY'S TREATMENT     PATIENT EDUCATION:  Education details: POC Person educated: Patient and Spouse Education method: Explanation Education comprehension: verbalized understanding  HOME EXERCISE PROGRAM:   ASSESSMENT:  CLINICAL IMPRESSION: Patient is a 76 y.o. female who was seen today for physical therapy evaluation and treatment for deconditioning and weakness s/p abdominal surgeries 03/2022. She presents with gait deviations and instability and scored a 11/30 on the FGA, indicating a high risk of falls. She has back and Rt knee pain currently and reports high functional limitations following her hospital stay to her current condition. She will benefit from skilled PT to address her observed impairments and functional limitations in order to meet her goals.  OBJECTIVE IMPAIRMENTS Abnormal gait, decreased activity tolerance, decreased balance, decreased endurance, decreased mobility, difficulty walking, decreased ROM, decreased strength, postural dysfunction, and pain.   ACTIVITY LIMITATIONS carrying, lifting, bending, squatting, stairs, toileting, dressing, locomotion level, and caring for  others  PARTICIPATION LIMITATIONS: meal prep, cleaning, laundry, driving, community activity, and yard work  [place], Time since onset of injury/illness/exacerbation, and 3+ comorbidities: see PMH  are also affecting patient's functional outcome.   REHAB POTENTIAL: Good  CLINICAL DECISION MAKING: Evolving/moderate complexity  EVALUATION COMPLEXITY: Low   GOALS: Goals reviewed with patient? Yes  SHORT TERM GOALS: Target date: 06/23/22  Patient will be independent with HEP to continue progressing outside of clinic. Baseline: see objective data Goal status: INITIAL  2.  Patient scores above a 18/30 on the FGA to represent reduced fall risk with gait activities. Baseline: see objective data Goal status: INITIAL   LONG TERM GOALS: Target date: 07/21/22  Patient will score FOTO >/= 55% to reflect improved self-perceived functional ability. Baseline: see objective data Goal status: INITIAL   2.  Patient scores above a 25/30 on the FGA to represent low fall risk with gait activities. Baseline: see objective data Goal status: INITIAL  3.  Patient  reports </= 2/10 back and knee pain with standing, gait, and stair activity. Baseline: see objective data Goal status: INITIAL  4.  Patient Rt knee seated LAQ to -2* to represent improved knee range and quadriceps strength. Baseline: see objective data Goal status: INITIAL  5.  Patient safely ambulates 500' including ramp, curb, and stairs independently to allow for household and community mobility. Baseline: see objective data Goal status: INITIAL  6.  Patient verbalizes and demonstrates ability to perform ADLs, household chores, and yardwork. Baseline: see objective data Goal status: INITIAL   PLAN: PT FREQUENCY: 2x/week  PT DURATION: 8 weeks  PLANNED INTERVENTIONS: Therapeutic exercises, Therapeutic activity, Neuromuscular re-education, Balance training, Gait training, Patient/Family education, Self Care,  Joint mobilization, Stair training, Vestibular training, DME instructions, Dry Needling, Electrical stimulation, Spinal mobilization, Cryotherapy, Moist heat, Ultrasound, Ionotophoresis '4mg'$ /ml Dexamethasone, Manual therapy, Re-evaluation, and physical performance tests .  PLAN FOR NEXT SESSION: assess hip ext and abd strength, provide HEP for static balance and core and LE strengthening   Jana Hakim, Student-PT 05/29/2022, 5:28 PM   This entire session of physical therapy was performed under the direct supervision of PT signing evaluation /treatment. PT reviewed note and agrees.  Jamey Reas, PT, DPT 05/29/2022, 5:35 PM

## 2022-06-01 ENCOUNTER — Ambulatory Visit (INDEPENDENT_AMBULATORY_CARE_PROVIDER_SITE_OTHER): Payer: Medicare Other | Admitting: Rehabilitative and Restorative Service Providers"

## 2022-06-01 ENCOUNTER — Encounter: Payer: Self-pay | Admitting: Rehabilitative and Restorative Service Providers"

## 2022-06-01 DIAGNOSIS — R2681 Unsteadiness on feet: Secondary | ICD-10-CM | POA: Diagnosis not present

## 2022-06-01 DIAGNOSIS — R2689 Other abnormalities of gait and mobility: Secondary | ICD-10-CM | POA: Diagnosis not present

## 2022-06-01 DIAGNOSIS — M5459 Other low back pain: Secondary | ICD-10-CM

## 2022-06-01 DIAGNOSIS — M6281 Muscle weakness (generalized): Secondary | ICD-10-CM | POA: Diagnosis not present

## 2022-06-01 DIAGNOSIS — M25661 Stiffness of right knee, not elsewhere classified: Secondary | ICD-10-CM

## 2022-06-01 DIAGNOSIS — M25561 Pain in right knee: Secondary | ICD-10-CM

## 2022-06-01 NOTE — Therapy (Signed)
OUTPATIENT PHYSICAL THERAPY TREATMENT   Patient Name: Katie Weber MRN: [id number] DOB:May 19, 1946, 76 y.o., female Today's Date: 06/01/2022  END OF SESSION:   PT End of Session - 06/01/22 1257     Visit Number 2    Number of Visits 16    Date for PT Re-Evaluation 07/21/22    Authorization Type Medicare Part A and B    Progress Note Due on Visit 10    PT Start Time 1259    PT Stop Time 1339    PT Time Calculation (min) 40 min    Activity Tolerance Patient limited by fatigue    Behavior During Therapy [facility] for tasks assessed/performed              Past Medical History:  Diagnosis Date   Arthritis    arthritis fingers   Coronary artery calcification 04/24/2017   Diverticulitis    x2 -last 1'17 tx. outpatient.   Environmental and seasonal allergies    2014 Possibly related to air freshners.   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   Family history of anesthesia complication 88FOY ago   daughter had an allergic reaction to atropine   GERD (gastroesophageal reflux disease)    History of hiatal hernia 09/14/2014   History of kidney stones    '80's lithotripsy-[facility] x1   Hyperlipidemia    Hypothyroidism    Pneumonia 2014   Pure hypercholesterolemia    Tremor of left hand    Past Surgical History:  Procedure Laterality Date   BALLOON DILATION N/A 05/04/2014   Procedure: BALLOON DILATION;  Surgeon: Garlan Fair, MD;  Location: [facility];  Service: Endoscopy;  Laterality: N/A;   BREAST LUMPECTOMY Right 1972   benign    BREAST SURGERY Right    '72 -benign tumor   childbirth- NVD x2      CHOLECYSTECTOMY N/A 03/10/2022   Procedure: LAPAROSCOPIC CHOLECYSTECTOMY;  Surgeon: Jovita Kussmaul, MD;  Location: [facility];  Service: General;  Laterality: N/A;   COLONOSCOPY WITH PROPOFOL N/A 05/04/2014   Procedure: COLONOSCOPY WITH PROPOFOL;  Surgeon: Garlan Fair, MD;  Location: [facility];  Service:  Endoscopy;  Laterality: N/A;   DILATION AND CURETTAGE OF UTERUS     04-20-14 [facility] hospital endometrial polyp removed.   ESOPHAGOGASTRODUODENOSCOPY (EGD) WITH PROPOFOL N/A 05/04/2014   Procedure: ESOPHAGOGASTRODUODENOSCOPY (EGD) WITH PROPOFOL;  Surgeon: Garlan Fair, MD;  Location: [facility];  Service: Endoscopy;  Laterality: N/A;. Procedure done in [place], [place]- 5 yrs ago.   HYSTEROSCOPY WITH D & C N/A 04/20/2014   Procedure: DILATATION AND CURETTAGE /HYSTEROSCOPY;  Surgeon: [name], MD;  Location: [facility];  Service: Gynecology;  Laterality: N/A;   IR PERC CHOLECYSTOSTOMY  01/08/2022   IR RADIOLOGIST EVAL & MGMT  02/10/2022   LAPAROSCOPY N/A 03/14/2022   Procedure: LAPAROSCOPY DIAGNOSTIC  WITH IRRIGATION AND  DRAINAGE OF Lucia Bitter;  Surgeon: Jovita Kussmaul, MD;  Location: [facility];  Service: General;  Laterality: N/A;   LITHOTRIPSY     POLYPECTOMY  2020   Dr. Lear Ng   TOTAL KNEE ARTHROPLASTY Left 02/15/2021   Procedure: LEFT TOTAL KNEE ARTHROPLASTY;  Surgeon: Mcarthur Rossetti, MD;  Location: [facility];  Service: Orthopedics;  Laterality: Left;   TOTAL KNEE ARTHROPLASTY Right 08/09/2021   Procedure: RIGHT TOTAL KNEE ARTHROPLASTY;  Surgeon: Mcarthur Rossetti, MD;  Location: [facility];  Service: Orthopedics;  Laterality: Right;  Needs  RNFA   TUBAL LIGATION     Patient Active Problem List   Diagnosis Date Noted   Hypovolemia due to dehydration 03/15/2022   AKI (acute kidney injury) ([place]) 03/15/2022   Biloma following surgery 03/14/2022   Abdominal pain 03/13/2022   Postoperative pain 03/13/2022   Acute cholecystitis 17/51/0258   Biliary colic 52/77/8242   New onset atrial flutter ([place]) 01/07/2022   DJD (degenerative joint disease) of knee 08/09/2021   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   Unilateral primary osteoarthritis, right knee 06/06/2021   Status post total left knee replacement 02/15/2021   Unilateral primary osteoarthritis, left knee  01/04/2021   Coronary artery calcification 04/24/2017   Hyperlipidemia 04/24/2017   Acute bronchitis 09/29/2014   SBO (small bowel obstruction) ([place]) 09/27/2014   Nausea and vomiting 09/27/2014   Hypothyroidism 09/27/2014   Leukocytosis 09/27/2014    PCP: Leeroy Cha, MD  REFERRING PROVIDER: Jovita Kussmaul, MD  REFERRING DIAG: T81.89XA,K66.8 (ICD-10-CM) - Biloma following surgery, initial encounter G89.18 (ICD-10-CM) - Postoperative pain  Rationale for Evaluation and Treatment Rehabilitation  THERAPY DIAG:  Unsteadiness on feet  Other abnormalities of gait and mobility  Muscle weakness (generalized)  Other low back pain  Acute pain of right knee  Stiffness of right knee, not elsewhere classified  ONSET DATE: 03/17/22 Referral  SUBJECTIVE:                                                                                                                                                                                           SUBJECTIVE STATEMENT: Pt indicated feeling ok today, no specific pain complaints.  Pt indicated she has been walking in house but not outside house for exercise.    PERTINENT HISTORY:  Cholecystectomy 03/10/22, laparoscopy 03/14/22 TKA Lt 5/22 Rt 11/22, hypothyroidism, hyperlipidemia, OA  PAIN:  NPRS scale: 0/10 Pain location: low back through middle and both sides Pain description: dull ache Aggravating factors: prolonged standing and activity Relieving factors: flexion, oxycodone  NPRS scale: 0/10 at rest Pain location: Rt anterior knee and patella Pain description: soreness Aggravating factors: first getting up in the morning, bending Relieving factors: moving  PRECAUTIONS: None  WEIGHT BEARING RESTRICTIONS No  FALLS:  Has patient fallen in last 6 months? No  LIVING ENVIRONMENT: Lives with: lives with their spouse Lives in: House/apartment Stairs: Yes: Internal: 13 steps; on left going up and External: 2 steps; can reach  both Has following equipment at home: Single point cane, Walker - 2 wheeled, Manufacturing engineer  OCCUPATION: retired, for leisure she enjoys hiking  PLOF: [place] return to activity level prior to surgery  without pain   OBJECTIVE:   PATIENT SURVEYS:  05/29/2022 FOTO  44%, target 55%  COGNITION: 05/29/2022 Overall cognitive status: Within functional limits for tasks assessed    SENSATION: 05/29/2022 Not tested  POSTURE:  05/29/2022 flexed trunk  and weight shift left  LUMBAR ROM:   Active AROM  05/29/2022  Flexion   Extension   Right lateral flexion   Left lateral flexion   Right rotation   Left rotation    (Blank rows = not tested)  LOWER EXTREMITY ROM:     ROM (A: AROM, P: PROM) Right Eval 05/29/2022 Left Eval 05/29/2022  Hip flexion    Hip extension    Hip abduction    Hip adduction    Hip internal rotation    Hip external rotation    Knee flexion    Knee extension Seated LAQ A: -8* Seated LAQ WNL  Ankle dorsiflexion    Ankle plantarflexion    Ankle inversion    Ankle eversion     (Blank rows = not tested)  LOWER EXTREMITY MMT:    MMT Right Eval 05/29/2022 Left Eval 05/29/2022  Hip flexion    Hip extension    Hip abduction    Hip adduction    Hip internal rotation    Hip external rotation    Knee flexion Seated  4/5 painful Seated  4/5  Knee extension Seated  4/5 painful Seated  5/5  Ankle dorsiflexion    Ankle plantarflexion    Ankle inversion    Ankle eversion     (Blank rows = not tested)   FUNCTIONAL TESTS:  05/29/2022 5 times sit to stand: 11.38 sec, no UE use  (cutoff for older adults is <12sec)  Functional gait assessment: 11/30   GAIT: 05/29/2022 Distance walked: 200' Assistive device utilized: None Level of assistance: SBA Comments: shortened step length bil., flexed trunk, some M-L instability and near losses of balance  TODAY'S TREATMENT  06/01/2022 Therex: Nustep lvl 5 5 mins, rest 2 min 5 mins   Sit to stand to sit no UE assist x 10 Seated SLR x 10 bilateral Standing feet together green band rows bilateral x 20 Standing gh ext feet together green band bilateral x 20   Additional time spent in review of HEP c cues for techniques, handout provided  Neuro Re-ed Tandem stance 1 min x 2 bilateral Feet together on foam [place]w anterior/posterior weight shifting 2 mins Tandem ambulation in // bars 10 ft x 10 fwd      PATIENT EDUCATION:  06/01/2022 Education details: HEP Person educated: Patient Education method: Explanation, cues, handout Education comprehension: verbalized understanding, demonstration  HOME EXERCISE PROGRAM: Access Code: [email or web address] URL: [email or web address] Date: 06/01/2022 Prepared by: Scot Jun  Exercises - Sit to Stand  - 3 x daily - 7 x weekly - 1 sets - 10 reps - Seated Straight Leg Heel Taps  - 1-2 x daily - 7 x weekly - 1-2 sets - 10 reps - Tandem Stance  - 1-2 x daily - 7 x weekly - 1 sets - 1-2 reps - 30- 60 sec hold  ASSESSMENT:  CLINICAL IMPRESSION: Considerations today given to current history of fatigue but overall tolerated well with intermittent rest breaks.  Combination of functional movement/strengthening and balance for home use.  Continued skilled PT services warranted at this time.   OBJECTIVE IMPAIRMENTS Abnormal gait, decreased activity tolerance, decreased balance, decreased endurance, decreased mobility, difficulty walking, decreased ROM, decreased strength, postural dysfunction, and pain.  ACTIVITY LIMITATIONS carrying, lifting, bending, squatting, stairs, toileting, dressing, locomotion level, and caring for others  PARTICIPATION LIMITATIONS: meal prep, cleaning, laundry, driving, community activity, and yard work  [place], Time since onset of injury/illness/exacerbation, and 3+ comorbidities: see PMH  are also affecting patient's functional outcome.   REHAB POTENTIAL: Good  CLINICAL  DECISION MAKING: Evolving/moderate complexity  EVALUATION COMPLEXITY: Low   GOALS: Goals reviewed with patient? Yes  SHORT TERM GOALS: Target date: 06/23/22  Patient will be independent with HEP to continue progressing outside of clinic. Baseline: see objective data Goal status: on going - 06/01/2022  2.  Patient scores above a 18/30 on the FGA to represent reduced fall risk with gait activities. Baseline: see objective data Goal status: on going - 06/01/2022   LONG TERM GOALS: Target date: 07/21/22  Patient will score FOTO >/= 55% to reflect improved self-perceived functional ability. Baseline: see objective data Goal status: INITIAL   2.  Patient scores above a 25/30 on the FGA to represent low fall risk with gait activities. Baseline: see objective data Goal status: INITIAL  3.  Patient reports </= 2/10 back and knee pain with standing, gait, and stair activity. Baseline: see objective data Goal status: INITIAL  4.  Patient Rt knee seated LAQ to -2* to represent improved knee range and quadriceps strength. Baseline: see objective data Goal status: INITIAL  5.  Patient safely ambulates 500' including ramp, curb, and stairs independently to allow for household and community mobility. Baseline: see objective data Goal status: INITIAL  6.  Patient verbalizes and demonstrates ability to perform ADLs, household chores, and yardwork. Baseline: see objective data Goal status: INITIAL   PLAN: PT FREQUENCY: 2x/week  PT DURATION: 8 weeks  PLANNED INTERVENTIONS: Therapeutic exercises, Therapeutic activity, Neuromuscular re-education, Balance training, Gait training, Patient/Family education, Self Care, Joint mobilization, Stair training, Vestibular training, DME instructions, Dry Needling, Electrical stimulation, Spinal mobilization, Cryotherapy, Moist heat, Ultrasound, Ionotophoresis '4mg'$ /ml Dexamethasone, Manual therapy, Re-evaluation, and physical performance tests .  PLAN  FOR NEXT SESSION: Progressive general strengthening and balance (static and dynamic, compliant surface).   Scot Jun, PT, DPT, OCS, ATC 06/01/22  1:38 PM

## 2022-06-06 ENCOUNTER — Ambulatory Visit (INDEPENDENT_AMBULATORY_CARE_PROVIDER_SITE_OTHER): Payer: Medicare Other | Admitting: Physical Therapy

## 2022-06-06 ENCOUNTER — Encounter: Payer: Self-pay | Admitting: Physical Therapy

## 2022-06-06 DIAGNOSIS — M5459 Other low back pain: Secondary | ICD-10-CM

## 2022-06-06 DIAGNOSIS — M25661 Stiffness of right knee, not elsewhere classified: Secondary | ICD-10-CM

## 2022-06-06 DIAGNOSIS — R2689 Other abnormalities of gait and mobility: Secondary | ICD-10-CM | POA: Diagnosis not present

## 2022-06-06 DIAGNOSIS — R2681 Unsteadiness on feet: Secondary | ICD-10-CM

## 2022-06-06 DIAGNOSIS — M6281 Muscle weakness (generalized): Secondary | ICD-10-CM

## 2022-06-06 DIAGNOSIS — M25561 Pain in right knee: Secondary | ICD-10-CM

## 2022-06-06 NOTE — Therapy (Signed)
OUTPATIENT PHYSICAL THERAPY TREATMENT   Patient Name: Katie Weber MRN: [id number] DOB:[birth year], 76 y.o., female Today's Date: 06/06/2022  END OF SESSION:   PT End of Session - 06/06/22 1606     Visit Number 3    Number of Visits 16    Date for PT Re-Evaluation 07/21/22    Authorization Type Medicare Part A and B    Progress Note Due on Visit 10    PT Start Time 1601    PT Stop Time 1645    PT Time Calculation (min) 44 min    Activity Tolerance Patient limited by fatigue;Patient tolerated treatment well    Behavior During Therapy [facility] for tasks assessed/performed             Past Medical History:  Diagnosis Date   Arthritis    arthritis fingers   Coronary artery calcification 04/24/2017   Diverticulitis    x2 -last 1'17 tx. outpatient.   Environmental and seasonal allergies    2014 Possibly related to air freshners.   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   Family history of anesthesia complication 17OHY ago   daughter had an allergic reaction to atropine   GERD (gastroesophageal reflux disease)    History of hiatal hernia 09/14/2014   History of kidney stones    '80's lithotripsy-UVA x1   Hyperlipidemia    Hypothyroidism    Pneumonia 2014   Pure hypercholesterolemia    Tremor of left hand    Past Surgical History:  Procedure Laterality Date   BALLOON DILATION N/A 05/04/2014   Procedure: BALLOON DILATION;  Surgeon: Garlan Fair, MD;  Location: [facility];  Service: Endoscopy;  Laterality: N/A;   BREAST LUMPECTOMY Right 1972   benign    BREAST SURGERY Right    '72 -benign tumor   childbirth- NVD x2      CHOLECYSTECTOMY N/A 03/10/2022   Procedure: LAPAROSCOPIC CHOLECYSTECTOMY;  Surgeon: Jovita Kussmaul, MD;  Location: [place];  Service: General;  Laterality: N/A;   COLONOSCOPY WITH PROPOFOL N/A 05/04/2014   Procedure: COLONOSCOPY WITH PROPOFOL;  Surgeon: Garlan Fair, MD;   Location: [facility];  Service: Endoscopy;  Laterality: N/A;   DILATION AND CURETTAGE OF UTERUS     04-20-14 [facility] hospital endometrial polyp removed.   ESOPHAGOGASTRODUODENOSCOPY (EGD) WITH PROPOFOL N/A 05/04/2014   Procedure: ESOPHAGOGASTRODUODENOSCOPY (EGD) WITH PROPOFOL;  Surgeon: Garlan Fair, MD;  Location: [facility];  Service: Endoscopy;  Laterality: N/A;. Procedure done in [place], [place]- 5 yrs ago.   HYSTEROSCOPY WITH D & C N/A 04/20/2014   Procedure: DILATATION AND CURETTAGE /HYSTEROSCOPY;  Surgeon: [name], MD;  Location: [facility];  Service: Gynecology;  Laterality: N/A;   IR PERC CHOLECYSTOSTOMY  01/08/2022   IR RADIOLOGIST EVAL & MGMT  02/10/2022   LAPAROSCOPY N/A 03/14/2022   Procedure: LAPAROSCOPY DIAGNOSTIC  WITH IRRIGATION AND  DRAINAGE OF Lucia Bitter;  Surgeon: Jovita Kussmaul, MD;  Location: [place];  Service: General;  Laterality: N/A;   LITHOTRIPSY     POLYPECTOMY  2020   Dr. Lear Ng   TOTAL KNEE ARTHROPLASTY Left 02/15/2021   Procedure: LEFT TOTAL KNEE ARTHROPLASTY;  Surgeon: Mcarthur Rossetti, MD;  Location: [place];  Service: Orthopedics;  Laterality: Left;   TOTAL KNEE ARTHROPLASTY Right 08/09/2021   Procedure: RIGHT TOTAL KNEE ARTHROPLASTY;  Surgeon: Mcarthur Rossetti, MD;  Location: [place];  Service: Orthopedics;  Laterality: Right;  Needs RNFA   TUBAL LIGATION     Patient Active Problem List   Diagnosis Date Noted   Hypovolemia due to dehydration 03/15/2022   AKI (acute kidney injury) ([place]) 03/15/2022   Biloma following surgery 03/14/2022   Abdominal pain 03/13/2022   Postoperative pain 03/13/2022   Acute cholecystitis 82/50/0370   Biliary colic 48/88/9169   New onset atrial flutter ([place]) 01/07/2022   DJD (degenerative joint disease) of knee 08/09/2021   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   Unilateral primary osteoarthritis, right knee 06/06/2021   Status post total left knee replacement 02/15/2021   Unilateral primary  osteoarthritis, left knee 01/04/2021   Coronary artery calcification 04/24/2017   Hyperlipidemia 04/24/2017   Acute bronchitis 09/29/2014   SBO (small bowel obstruction) ([place]) 09/27/2014   Nausea and vomiting 09/27/2014   Hypothyroidism 09/27/2014   Leukocytosis 09/27/2014    PCP: Leeroy Cha, MD  REFERRING PROVIDER: Jovita Kussmaul, MD  REFERRING DIAG: T81.89XA,K66.8 (ICD-10-CM) - Biloma following surgery, initial encounter G89.18 (ICD-10-CM) - Postoperative pain  Rationale for Evaluation and Treatment Rehabilitation  THERAPY DIAG:  Unsteadiness on feet  Other abnormalities of gait and mobility  Muscle weakness (generalized)  Other low back pain  Acute pain of right knee  Stiffness of right knee, not elsewhere classified  ONSET DATE: 03/17/22 Referral  SUBJECTIVE:                                                                                                                                                                                           SUBJECTIVE STATEMENT: She had some increased soreness after the last session. She has been practicing tandem walking around curves in home and SLR exercise. She is beginning to feel steadier and more confident in her balance.  PERTINENT HISTORY:  Cholecystectomy 03/10/22, laparoscopy 03/14/22 TKA Lt 5/22 Rt 11/22, hypothyroidism, hyperlipidemia, OA  PAIN:  NPRS scale: 0/10 Pain location: low back through middle and both sides Pain description: dull ache Aggravating factors: prolonged standing and activity Relieving factors: flexion, oxycodone  NPRS scale: 0/10 at rest Pain location: Rt anterior knee and patella Pain description: soreness Aggravating factors: first getting up in the morning, bending Relieving factors: moving  PRECAUTIONS: None  WEIGHT BEARING RESTRICTIONS No  FALLS:  Has patient fallen in last 6 months? No  LIVING ENVIRONMENT: Lives with: lives with their spouse Lives in:  House/apartment Stairs: Yes: Internal: 13 steps; on left going up and External: 2 steps; can reach both Has following equipment at home: Single point cane, Walker - 2 wheeled, Manufacturing engineer  OCCUPATION: retired, for leisure she enjoys hiking  PLOF: Independent  PATIENT GOALS return to activity level prior to surgery without pain   OBJECTIVE:   PATIENT SURVEYS:  05/29/2022 FOTO  44%, target 55%  COGNITION: 05/29/2022 Overall cognitive status: Within functional limits for tasks assessed    SENSATION: 05/29/2022 Not tested  POSTURE:  05/29/2022 flexed trunk  and weight shift left  LUMBAR ROM:   Active AROM  05/29/2022  Flexion   Extension   Right lateral flexion   Left lateral flexion   Right rotation   Left rotation    (Blank rows = not tested)  LOWER EXTREMITY ROM:     ROM (A: AROM, P: PROM) Right Eval 05/29/2022 Left Eval 05/29/2022  Hip flexion    Hip extension    Hip abduction    Hip adduction    Hip internal rotation    Hip external rotation    Knee flexion    Knee extension Seated LAQ A: -8* Seated LAQ WNL  Ankle dorsiflexion    Ankle plantarflexion    Ankle inversion    Ankle eversion     (Blank rows = not tested)  LOWER EXTREMITY MMT:    MMT Right Eval 05/29/2022 Left Eval 05/29/2022  Hip flexion    Hip extension    Hip abduction    Hip adduction    Hip internal rotation    Hip external rotation    Knee flexion Seated  4/5 painful Seated  4/5  Knee extension Seated  4/5 painful Seated  5/5  Ankle dorsiflexion    Ankle plantarflexion    Ankle inversion    Ankle eversion     (Blank rows = not tested)   FUNCTIONAL TESTS:  05/29/2022 5 times sit to stand: 11.38 sec, no UE use  (cutoff for older adults is <12sec)  Functional gait assessment: 11/30   GAIT: 05/29/2022 Distance walked: 200' Assistive device utilized: None Level of assistance: SBA Comments: shortened step length bil., flexed trunk, some M-L instability and near  losses of balance  TODAY'S TREATMENT  06/06/2022 Therex: Nustep lvl 5 5 mins BLE/BUEs, 5 mins BLEs  Neuro Re-ed: Counter exercises added to HEP 10 reps at a time, paced throughout day as indicated by fatigue -grapevine walking x 3 at counter -hip extension alternating x 10 -hip abduction alternating x 11 -standing marching alternating x 11 UE ranger flexion with anterior step and following hand with eyes and head x 10 bil. with some losses of balance requiring minA to recover Amb 37' with 20' x 2 of fast walking resembling crossing street Amb 69' with horizontal head turns and intermittent CGA Amb 56' with vertical head turns and intermittent CGA Amb 180' diagonal head turns (90' each way) with CGA Amb 180' with 3 steps eyes open 3 steps eyes closed 4" step up over down alternating LEs with intermittent UE support Foam alternating SLE stance with 3 cone anterior tapping, intermittent UE support and CGA Foam stance horizontal, vertical, diagonal head turns eyes open and eyes closed with intermittent UE support and CGA  06/01/2022 Therex: Nustep lvl 5 5 mins, rest 2 min 5 mins  Sit to stand to sit no UE assist x 10 Seated SLR x 10 bilateral Standing feet together green band rows bilateral x 20 Standing gh ext feet together green band bilateral x 20   Additional time spent in review of HEP c cues for techniques, handout provided  Neuro Re-ed Tandem stance 1 min x 2 bilateral Feet together on foam [place] pew anterior/posterior weight shifting 2 mins Tandem  ambulation in // bars 10 ft x 10 fwd   PATIENT EDUCATION:  06/01/2022 Education details: HEP Person educated: Patient Education method: Explanation, cues, handout Education comprehension: verbalized understanding, demonstration  HOME EXERCISE PROGRAM: Access Code: [email or web address] URL: [email or web address] Date: 06/06/2022 Prepared by: Jamey Reas  Exercises - Sit to Stand  - 3 x daily - 7 x weekly - 1 sets -  10 reps - Seated Straight Leg Heel Taps  - 1-2 x daily - 7 x weekly - 1-2 sets - 10 reps - Tandem Stance  - 1-2 x daily - 7 x weekly - 1 sets - 1-2 reps - 30- 60 sec hold - [place] with Counter Support  - 1-2 x daily - 7 x weekly - 1 sets - 10 reps - Standing hip extension alternating legs  - 1-2 x daily - 7 x weekly - 1 sets - 10 reps - 5 seconds hold - Standing hip abduction alternating legs  - 1-2 x daily - 7 x weekly - 1 sets - 10 reps - 5 seconds hold - Standing March with Counter Support  - 1-2 x daily - 7 x weekly - 1 sets - 10 reps - 5 seconds hold  ASSESSMENT:  CLINICAL IMPRESSION: She had some increased soreness after last session and was advised to complete home exercises paced throughout her day. Today's session included balance tasks within functional activities with varying surfaces and visual inputs, and she required contact guard assist or UE support for most exercises. She will continue to benefit from skilled PT to address strength and balance deficits.  OBJECTIVE IMPAIRMENTS Abnormal gait, decreased activity tolerance, decreased balance, decreased endurance, decreased mobility, difficulty walking, decreased ROM, decreased strength, postural dysfunction, and pain.   ACTIVITY LIMITATIONS carrying, lifting, bending, squatting, stairs, toileting, dressing, locomotion level, and caring for others  PARTICIPATION LIMITATIONS: meal prep, cleaning, laundry, driving, community activity, and yard work  [place], Time since onset of injury/illness/exacerbation, and 3+ comorbidities: see PMH  are also affecting patient's functional outcome.   REHAB POTENTIAL: Good  CLINICAL DECISION MAKING: Evolving/moderate complexity  EVALUATION COMPLEXITY: Low   GOALS: Goals reviewed with patient? Yes  SHORT TERM GOALS: Target date: 06/23/22  Patient will be independent with HEP to continue progressing outside of clinic. Baseline: see objective data Goal status: on going -  06/06/2022  2.  Patient scores above a 18/30 on the FGA to represent reduced fall risk with gait activities. Baseline: see objective data Goal status: on going - 06/06/2022   LONG TERM GOALS: Target date: 07/21/22  Patient will score FOTO >/= 55% to reflect improved self-perceived functional ability. Baseline: see objective data Goal status: INITIAL   2.  Patient scores above a 25/30 on the FGA to represent low fall risk with gait activities. Baseline: see objective data Goal status: INITIAL  3.  Patient reports </= 2/10 back and knee pain with standing, gait, and stair activity. Baseline: see objective data Goal status: INITIAL  4.  Patient Rt knee seated LAQ to -2* to represent improved knee range and quadriceps strength. Baseline: see objective data Goal status: INITIAL  5.  Patient safely ambulates 500' including ramp, curb, and stairs independently to allow for household and community mobility. Baseline: see objective data Goal status: INITIAL  6.  Patient verbalizes and demonstrates ability to perform ADLs, household chores, and yardwork. Baseline: see objective data Goal status: INITIAL   PLAN: PT FREQUENCY: 2x/week  PT DURATION: 8 weeks  PLANNED INTERVENTIONS: Therapeutic exercises, Therapeutic activity,  Neuromuscular re-education, Balance training, Gait training, Patient/Family education, Self Care, Joint mobilization, Stair training, Vestibular training, DME instructions, Dry Needling, Electrical stimulation, Spinal mobilization, Cryotherapy, Moist heat, Ultrasound, Ionotophoresis '4mg'$ /ml Dexamethasone, Manual therapy, Re-evaluation, and physical performance tests .  PLAN FOR NEXT SESSION: Assess new HEP recommendations. Continue progressive general strengthening and balance (static and dynamic/gait, compliant surface, varying visual input)  Jana Hakim, SPT 06/06/22, 5:00 PM  This entire session of physical therapy was performed under the direct supervision of  PT signing evaluation /treatment. PT reviewed note and agrees.  Jamey Reas, PT, DPT 06/06/22  5:00 PM

## 2022-06-08 ENCOUNTER — Ambulatory Visit (INDEPENDENT_AMBULATORY_CARE_PROVIDER_SITE_OTHER): Payer: Medicare Other | Admitting: Physical Therapy

## 2022-06-08 ENCOUNTER — Encounter: Payer: Self-pay | Admitting: Physical Therapy

## 2022-06-08 DIAGNOSIS — R2681 Unsteadiness on feet: Secondary | ICD-10-CM | POA: Diagnosis not present

## 2022-06-08 DIAGNOSIS — M6281 Muscle weakness (generalized): Secondary | ICD-10-CM | POA: Diagnosis not present

## 2022-06-08 DIAGNOSIS — R2689 Other abnormalities of gait and mobility: Secondary | ICD-10-CM | POA: Diagnosis not present

## 2022-06-08 NOTE — Therapy (Signed)
OUTPATIENT PHYSICAL THERAPY TREATMENT   Patient Name: Katie Weber MRN: [id number] DOB:01-24-1946, 76 y.o., female Today's Date: 06/08/2022  END OF SESSION:   PT End of Session - 06/08/22 1606     Visit Number 4    Number of Visits 16    Date for PT Re-Evaluation 07/21/22    Authorization Type Medicare Part A and B    Progress Note Due on Visit 10    PT Start Time 1518    PT Stop Time 1557    PT Time Calculation (min) 39 min    Activity Tolerance Patient limited by fatigue;Patient tolerated treatment well    Behavior During Therapy [facility] for tasks assessed/performed              Past Medical History:  Diagnosis Date   Arthritis    arthritis fingers   Coronary artery calcification 04/24/2017   Diverticulitis    x2 -last 1'17 tx. outpatient.   Environmental and seasonal allergies    2014 Possibly related to air freshners.   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   Family history of anesthesia complication 91MBW ago   daughter had an allergic reaction to atropine   GERD (gastroesophageal reflux disease)    History of hiatal hernia 09/14/2014   History of kidney stones    '80's lithotripsy-UVA x1   Hyperlipidemia    Hypothyroidism    Pneumonia 2014   Pure hypercholesterolemia    Tremor of left hand    Past Surgical History:  Procedure Laterality Date   BALLOON DILATION N/A 05/04/2014   Procedure: BALLOON DILATION;  Surgeon: Garlan Fair, MD;  Location: [facility];  Service: Endoscopy;  Laterality: N/A;   BREAST LUMPECTOMY Right 1972   benign    BREAST SURGERY Right    '72 -benign tumor   childbirth- NVD x2      CHOLECYSTECTOMY N/A 03/10/2022   Procedure: LAPAROSCOPIC CHOLECYSTECTOMY;  Surgeon: Jovita Kussmaul, MD;  Location: [place];  Service: General;  Laterality: N/A;   COLONOSCOPY WITH PROPOFOL N/A 05/04/2014   Procedure: COLONOSCOPY WITH PROPOFOL;  Surgeon: Garlan Fair, MD;   Location: [facility];  Service: Endoscopy;  Laterality: N/A;   DILATION AND CURETTAGE OF UTERUS     04-20-14 [facility] hospital endometrial polyp removed.   ESOPHAGOGASTRODUODENOSCOPY (EGD) WITH PROPOFOL N/A 05/04/2014   Procedure: ESOPHAGOGASTRODUODENOSCOPY (EGD) WITH PROPOFOL;  Surgeon: Garlan Fair, MD;  Location: [facility];  Service: Endoscopy;  Laterality: N/A;. Procedure done in [place], [place]- 5 yrs ago.   HYSTEROSCOPY WITH D & C N/A 04/20/2014   Procedure: DILATATION AND CURETTAGE /HYSTEROSCOPY;  Surgeon: [name], MD;  Location: [facility];  Service: Gynecology;  Laterality: N/A;   IR PERC CHOLECYSTOSTOMY  01/08/2022   IR RADIOLOGIST EVAL & MGMT  02/10/2022   LAPAROSCOPY N/A 03/14/2022   Procedure: LAPAROSCOPY DIAGNOSTIC  WITH IRRIGATION AND  DRAINAGE OF Lucia Bitter;  Surgeon: Jovita Kussmaul, MD;  Location: [place];  Service: General;  Laterality: N/A;   LITHOTRIPSY     POLYPECTOMY  2020   Dr. Lear Ng   TOTAL KNEE ARTHROPLASTY Left 02/15/2021   Procedure: LEFT TOTAL KNEE ARTHROPLASTY;  Surgeon: Mcarthur Rossetti, MD;  Location: [place];  Service: Orthopedics;  Laterality: Left;   TOTAL KNEE ARTHROPLASTY Right 08/09/2021   Procedure: RIGHT TOTAL KNEE ARTHROPLASTY;  Surgeon: Mcarthur Rossetti, MD;  Location: [place];  Service: Orthopedics;  Laterality:  Right;  Needs RNFA   TUBAL LIGATION     Patient Active Problem List   Diagnosis Date Noted   Hypovolemia due to dehydration 03/15/2022   AKI (acute kidney injury) ([place]) 03/15/2022   Biloma following surgery 03/14/2022   Abdominal pain 03/13/2022   Postoperative pain 03/13/2022   Acute cholecystitis 62/37/6283   Biliary colic 15/17/6160   New onset atrial flutter ([place]) 01/07/2022   DJD (degenerative joint disease) of knee 08/09/2021   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   Unilateral primary osteoarthritis, right knee 06/06/2021   Status post total left knee replacement 02/15/2021   Unilateral primary  osteoarthritis, left knee 01/04/2021   Coronary artery calcification 04/24/2017   Hyperlipidemia 04/24/2017   Acute bronchitis 09/29/2014   SBO (small bowel obstruction) ([place]) 09/27/2014   Nausea and vomiting 09/27/2014   Hypothyroidism 09/27/2014   Leukocytosis 09/27/2014    PCP: Leeroy Cha, MD  REFERRING PROVIDER: Jovita Kussmaul, MD  REFERRING DIAG: T81.89XA,K66.8 (ICD-10-CM) - Biloma following surgery, initial encounter G89.18 (ICD-10-CM) - Postoperative pain  Rationale for Evaluation and Treatment Rehabilitation  THERAPY DIAG:  Unsteadiness on feet  Other abnormalities of gait and mobility  Muscle weakness (generalized)  ONSET DATE: 03/17/22 Referral  SUBJECTIVE:                                                                                                                                                                                           SUBJECTIVE STATEMENT: Doing well, I feel like I'm doing better- not walking into as many walls and feeling a bit more steady.   PERTINENT HISTORY:  Cholecystectomy 03/10/22, laparoscopy 03/14/22 TKA Lt 5/22 Rt 11/22, hypothyroidism, hyperlipidemia, OA  PAIN:  NPRS scale: 0/10 Pain location:  Pain description:  Aggravating factors:  Relieving factors:  NPRS scale: 0/10 at rest Pain location: Rt anterior knee and patella Pain description: soreness Aggravating factors: first getting up in the morning, bending Relieving factors: moving  PRECAUTIONS: None  WEIGHT BEARING RESTRICTIONS No  FALLS:  Has patient fallen in last 6 months? No  LIVING ENVIRONMENT: Lives with: lives with their spouse Lives in: House/apartment Stairs: Yes: Internal: 13 steps; on left going up and External: 2 steps; can reach both Has following equipment at home: Single point cane, Walker - 2 wheeled, Manufacturing engineer  OCCUPATION: retired, for leisure she enjoys hiking  PLOF: [place] return to activity level prior  to surgery without pain   OBJECTIVE:   PATIENT SURVEYS:  05/29/2022 FOTO  44%, target 55%  COGNITION: 05/29/2022 Overall cognitive status: Within functional limits for tasks assessed    SENSATION: 05/29/2022  Not tested  POSTURE:  05/29/2022 flexed trunk  and weight shift left  LUMBAR ROM:   Active AROM  05/29/2022  Flexion   Extension   Right lateral flexion   Left lateral flexion   Right rotation   Left rotation    (Blank rows = not tested)  LOWER EXTREMITY ROM:     ROM (A: AROM, P: PROM) Right Eval 05/29/2022 Left Eval 05/29/2022  Hip flexion    Hip extension    Hip abduction    Hip adduction    Hip internal rotation    Hip external rotation    Knee flexion    Knee extension Seated LAQ A: -8* Seated LAQ WNL  Ankle dorsiflexion    Ankle plantarflexion    Ankle inversion    Ankle eversion     (Blank rows = not tested)  LOWER EXTREMITY MMT:    MMT Right Eval 05/29/2022 Left Eval 05/29/2022  Hip flexion    Hip extension    Hip abduction    Hip adduction    Hip internal rotation    Hip external rotation    Knee flexion Seated  4/5 painful Seated  4/5  Knee extension Seated  4/5 painful Seated  5/5  Ankle dorsiflexion    Ankle plantarflexion    Ankle inversion    Ankle eversion     (Blank rows = not tested)   FUNCTIONAL TESTS:  05/29/2022 5 times sit to stand: 11.38 sec, no UE use  (cutoff for older adults is <12sec)  Functional gait assessment: 11/30   GAIT: 05/29/2022 Distance walked: 200' Assistive device utilized: None Level of assistance: SBA Comments: shortened step length bil., flexed trunk, some M-L instability and near losses of balance  TODAY'S TREATMENT   06/08/22  Nustep L5 x6 minutes BLEs only   -Tandem stance 3x30 seconds B  -Forward lunges on BOSU 1x10 B  -Narrow BOS eyes closed 3x30 seconds -Toe taps on BOSU while standing on blue foam pad x20 forward, then x10 sideways each leg -3 way toe taps on blue foam pad x10  B with stance knee slightly bent - backwards walking with horizontal head turns x3 laps (about 66f each) - sideways stepping with vertical head turns x2 laps (about 241feach)  06/06/2022 Therex: Nustep lvl 5 5 mins BLE/BUEs, 5 mins BLEs    Neuro Re-ed: Counter exercises added to HEP 10 reps at a time, paced throughout day as indicated by fatigue -grapevine walking x 3 at counter -hip extension alternating x 10 -hip abduction alternating x 11 -standing marching alternating x 11 UE ranger flexion with anterior step and following hand with eyes and head x 10 bil. with some losses of balance requiring minA to recover Amb [id number] 20' x 2 of fast walking resembling crossing street Amb 90' with horizontal head turns and intermittent CGA Amb 90' with vertical head turns and intermittent CGA Amb 180' diagonal head turns (90' each way) with CGA Amb 180' with 3 steps eyes open 3 steps eyes closed 4" step up over down alternating LEs with intermittent UE support Foam alternating SLE stance with 3 cone anterior tapping, intermittent UE support and CGA Foam stance horizontal, vertical, diagonal head turns eyes open and eyes closed with intermittent UE support and CGA  06/01/2022 Therex: Nustep lvl 5 5 mins, rest 2 min 5 mins  Sit to stand to sit no UE assist x 10 Seated SLR x 10 bilateral Standing feet together green band rows bilateral x 20  Standing gh ext feet together green band bilateral x 20   Additional time spent in review of HEP c cues for techniques, handout provided  Neuro Re-ed Tandem stance 1 min x 2 bilateral Feet together on foam church pew anterior/posterior weight shifting 2 mins Tandem ambulation in // bars 10 ft x 10 fwd   PATIENT EDUCATION:  06/01/2022 Education details: HEP Person educated: Patient Education method: Explanation, cues, handout Education comprehension: verbalized understanding, demonstration  HOME EXERCISE PROGRAM: Access Code: [email or web address] URL:  [email or web address] Date: 06/06/2022 Prepared by: Jamey Reas  Exercises - Sit to Stand  - 3 x daily - 7 x weekly - 1 sets - 10 reps - Seated Straight Leg Heel Taps  - 1-2 x daily - 7 x weekly - 1-2 sets - 10 reps - Tandem Stance  - 1-2 x daily - 7 x weekly - 1 sets - 1-2 reps - 30- 60 sec hold - [place] with Counter Support  - 1-2 x daily - 7 x weekly - 1 sets - 10 reps - Standing hip extension alternating legs  - 1-2 x daily - 7 x weekly - 1 sets - 10 reps - 5 seconds hold - Standing hip abduction alternating legs  - 1-2 x daily - 7 x weekly - 1 sets - 10 reps - 5 seconds hold - Standing March with Counter Support  - 1-2 x daily - 7 x weekly - 1 sets - 10 reps - 5 seconds hold  ASSESSMENT:  CLINICAL IMPRESSION:   Murlene arrives doing well today, we continued focus on balance training this session with mix of dynamic and static balance challenges. Seemed to fatigue easily today but did well, required up to [facility] for balance this session. Will continue to benefit from skilled PT services to address balance and fall risk moving forward.   OBJECTIVE IMPAIRMENTS Abnormal gait, decreased activity tolerance, decreased balance, decreased endurance, decreased mobility, difficulty walking, decreased ROM, decreased strength, postural dysfunction, and pain.   ACTIVITY LIMITATIONS carrying, lifting, bending, squatting, stairs, toileting, dressing, locomotion level, and caring for others  PARTICIPATION LIMITATIONS: meal prep, cleaning, laundry, driving, community activity, and yard work  [place], Time since onset of injury/illness/exacerbation, and 3+ comorbidities: see PMH  are also affecting patient's functional outcome.   REHAB POTENTIAL: Good  CLINICAL DECISION MAKING: Evolving/moderate complexity  EVALUATION COMPLEXITY: Low   GOALS: Goals reviewed with patient? Yes  SHORT TERM GOALS: Target date: 06/23/22  Patient will be independent with HEP to  continue progressing outside of clinic. Baseline: see objective data Goal status: on going - 06/06/2022  2.  Patient scores above a 18/30 on the FGA to represent reduced fall risk with gait activities. Baseline: see objective data Goal status: on going - 06/06/2022   LONG TERM GOALS: Target date: 07/21/22  Patient will score FOTO >/= 55% to reflect improved self-perceived functional ability. Baseline: see objective data Goal status: INITIAL   2.  Patient scores above a 25/30 on the FGA to represent low fall risk with gait activities. Baseline: see objective data Goal status: INITIAL  3.  Patient reports </= 2/10 back and knee pain with standing, gait, and stair activity. Baseline: see objective data Goal status: INITIAL  4.  Patient Rt knee seated LAQ to -2* to represent improved knee range and quadriceps strength. Baseline: see objective data Goal status: INITIAL  5.  Patient safely ambulates 500' including ramp, curb, and stairs independently to allow for household and community mobility. Baseline: see objective  data Goal status: INITIAL  6.  Patient verbalizes and demonstrates ability to perform ADLs, household chores, and yardwork. Baseline: see objective data Goal status: INITIAL   PLAN: PT FREQUENCY: 2x/week  PT DURATION: 8 weeks  PLANNED INTERVENTIONS: Therapeutic exercises, Therapeutic activity, Neuromuscular re-education, Balance training, Gait training, Patient/Family education, Self Care, Joint mobilization, Stair training, Vestibular training, DME instructions, Dry Needling, Electrical stimulation, Spinal mobilization, Cryotherapy, Moist heat, Ultrasound, Ionotophoresis '4mg'$ /ml Dexamethasone, Manual therapy, Re-evaluation, and physical performance tests .  PLAN FOR NEXT SESSION: Assess new HEP recommendations. Continue progressive general strengthening and balance (static and dynamic/gait, compliant surface, varying visual input)   Aubriana Ravelo U PT DPT PN2   06/08/2022, 4:07 PM

## 2022-06-13 ENCOUNTER — Ambulatory Visit (INDEPENDENT_AMBULATORY_CARE_PROVIDER_SITE_OTHER): Payer: Medicare Other | Admitting: Physical Therapy

## 2022-06-13 ENCOUNTER — Encounter: Payer: Self-pay | Admitting: Physical Therapy

## 2022-06-13 DIAGNOSIS — M6281 Muscle weakness (generalized): Secondary | ICD-10-CM

## 2022-06-13 DIAGNOSIS — R2689 Other abnormalities of gait and mobility: Secondary | ICD-10-CM

## 2022-06-13 DIAGNOSIS — R2681 Unsteadiness on feet: Secondary | ICD-10-CM

## 2022-06-13 NOTE — Therapy (Signed)
OUTPATIENT PHYSICAL THERAPY TREATMENT   Patient Name: Katie Weber MRN: [id number] DOB:Jan 11, 1946, 76 y.o., female Today's Date: 06/13/2022  END OF SESSION:   PT End of Session - 06/13/22 1559     Visit Number 5    Number of Visits 16    Date for PT Re-Evaluation 07/21/22    Authorization Type Medicare Part A and B    Progress Note Due on Visit 10    PT Start Time 1559    PT Stop Time 1644    PT Time Calculation (min) 45 min    Activity Tolerance Patient limited by fatigue;Patient tolerated treatment well    Behavior During Therapy [facility] for tasks assessed/performed               Past Medical History:  Diagnosis Date   Arthritis    arthritis fingers   Coronary artery calcification 04/24/2017   Diverticulitis    x2 -last 1'17 tx. outpatient.   Environmental and seasonal allergies    2014 Possibly related to air freshners.   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   Family history of anesthesia complication 96EXB ago   daughter had an allergic reaction to atropine   GERD (gastroesophageal reflux disease)    History of hiatal hernia 09/14/2014   History of kidney stones    '80's lithotripsy-UVA x1   Hyperlipidemia    Hypothyroidism    Pneumonia 2014   Pure hypercholesterolemia    Tremor of left hand    Past Surgical History:  Procedure Laterality Date   BALLOON DILATION N/A 05/04/2014   Procedure: BALLOON DILATION;  Surgeon: Garlan Fair, MD;  Location: [facility];  Service: Endoscopy;  Laterality: N/A;   BREAST LUMPECTOMY Right 1972   benign    BREAST SURGERY Right    '72 -benign tumor   childbirth- NVD x2      CHOLECYSTECTOMY N/A 03/10/2022   Procedure: LAPAROSCOPIC CHOLECYSTECTOMY;  Surgeon: Jovita Kussmaul, MD;  Location: [place];  Service: General;  Laterality: N/A;   COLONOSCOPY WITH PROPOFOL N/A 05/04/2014   Procedure: COLONOSCOPY WITH PROPOFOL;  Surgeon: Garlan Fair, MD;   Location: [facility];  Service: Endoscopy;  Laterality: N/A;   DILATION AND CURETTAGE OF UTERUS     04-20-14 [facility] endometrial polyp removed.   ESOPHAGOGASTRODUODENOSCOPY (EGD) WITH PROPOFOL N/A 05/04/2014   Procedure: ESOPHAGOGASTRODUODENOSCOPY (EGD) WITH PROPOFOL;  Surgeon: Garlan Fair, MD;  Location: [facility];  Service: Endoscopy;  Laterality: N/A;. Procedure done in [place], [place]- 5 yrs ago.   HYSTEROSCOPY WITH D & C N/A 04/20/2014   Procedure: DILATATION AND CURETTAGE /HYSTEROSCOPY;  Surgeon: [name], MD;  Location: [facility];  Service: Gynecology;  Laterality: N/A;   IR PERC CHOLECYSTOSTOMY  01/08/2022   IR RADIOLOGIST EVAL & MGMT  02/10/2022   LAPAROSCOPY N/A 03/14/2022   Procedure: LAPAROSCOPY DIAGNOSTIC  WITH IRRIGATION AND  DRAINAGE OF Lucia Bitter;  Surgeon: Jovita Kussmaul, MD;  Location: [place];  Service: General;  Laterality: N/A;   LITHOTRIPSY     POLYPECTOMY  2020   Dr. Lear Ng   TOTAL KNEE ARTHROPLASTY Left 02/15/2021   Procedure: LEFT TOTAL KNEE ARTHROPLASTY;  Surgeon: Mcarthur Rossetti, MD;  Location: [place];  Service: Orthopedics;  Laterality: Left;   TOTAL KNEE ARTHROPLASTY Right 08/09/2021   Procedure: RIGHT TOTAL KNEE ARTHROPLASTY;  Surgeon: Mcarthur Rossetti, MD;  Location: [place];  Service: Orthopedics;  Laterality: Right;  Needs RNFA   TUBAL LIGATION     Patient Active Problem List   Diagnosis Date Noted   Hypovolemia due to dehydration 03/15/2022   AKI (acute kidney injury) ([place]) 03/15/2022   Biloma following surgery 03/14/2022   Abdominal pain 03/13/2022   Postoperative pain 03/13/2022   Acute cholecystitis 94/70/9628   Biliary colic 36/62/9476   New onset atrial flutter ([place]) 01/07/2022   DJD (degenerative joint disease) of knee 08/09/2021   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   Unilateral primary osteoarthritis, right knee 06/06/2021   Status post total left knee replacement 02/15/2021   Unilateral primary  osteoarthritis, left knee 01/04/2021   Coronary artery calcification 04/24/2017   Hyperlipidemia 04/24/2017   Acute bronchitis 09/29/2014   SBO (small bowel obstruction) ([place]) 09/27/2014   Nausea and vomiting 09/27/2014   Hypothyroidism 09/27/2014   Leukocytosis 09/27/2014    PCP: Leeroy Cha, MD  REFERRING PROVIDER: Jovita Kussmaul, MD  REFERRING DIAG: T81.89XA,K66.8 (ICD-10-CM) - Biloma following surgery, initial encounter G89.18 (ICD-10-CM) - Postoperative pain  Rationale for Evaluation and Treatment Rehabilitation  THERAPY DIAG:  Unsteadiness on feet  Other abnormalities of gait and mobility  Muscle weakness (generalized)  ONSET DATE: 03/17/22 Referral  SUBJECTIVE:                                                                                                                                                                                           SUBJECTIVE STATEMENT: She has been doing her exercises including scanning while walking.   PERTINENT HISTORY:  Cholecystectomy 03/10/22, laparoscopy 03/14/22 TKA Lt 5/22 Rt 11/22, hypothyroidism, hyperlipidemia, OA  PAIN:  NPRS scale:   0/10 Pain location:back Pain description:  Aggravating factors:  Relieving factors:    NPRS scale: 0/10 at rest 210 in morning Pain location: Rt anterior knee and patella Pain description: soreness Aggravating factors: first getting up in the morning, bending Relieving factors: moving  PRECAUTIONS: None  WEIGHT BEARING RESTRICTIONS No  FALLS:  Has patient fallen in last 6 months? No  LIVING ENVIRONMENT: Lives with: lives with their spouse Lives in: House/apartment Stairs: Yes: Internal: 13 steps; on left going up and External: 2 steps; can reach both Has following equipment at home: Single point cane, Walker - 2 wheeled, Manufacturing engineer  OCCUPATION: retired, for leisure she enjoys hiking  PLOF: [place] return to activity level prior to surgery  without pain   OBJECTIVE:   PATIENT SURVEYS:  05/29/2022 FOTO  44%, target 55%  COGNITION: 05/29/2022 Overall cognitive status: Within functional limits for tasks assessed    SENSATION: 05/29/2022 Not tested  POSTURE:  05/29/2022 flexed trunk  and weight shift left  LUMBAR ROM:   Active AROM  05/29/2022  Flexion   Extension   Right lateral flexion   Left lateral flexion   Right rotation   Left rotation    (Blank rows = not tested)  LOWER EXTREMITY ROM:     ROM (A: AROM, P: PROM) Right Eval 05/29/2022 Left Eval 05/29/2022  Hip flexion    Hip extension    Hip abduction    Hip adduction    Hip internal rotation    Hip external rotation    Knee flexion    Knee extension Seated LAQ A: -8* Seated LAQ WNL  Ankle dorsiflexion    Ankle plantarflexion    Ankle inversion    Ankle eversion     (Blank rows = not tested)  LOWER EXTREMITY MMT:    MMT Right Eval 05/29/2022 Left Eval 05/29/2022  Hip flexion    Hip extension    Hip abduction    Hip adduction    Hip internal rotation    Hip external rotation    Knee flexion Seated  4/5 painful Seated  4/5  Knee extension Seated  4/5 painful Seated  5/5  Ankle dorsiflexion    Ankle plantarflexion    Ankle inversion    Ankle eversion     (Blank rows = not tested)   FUNCTIONAL TESTS:  05/29/2022 5 times sit to stand: 11.38 sec, no UE use  (cutoff for older adults is <12sec)  Functional gait assessment: 11/30   GAIT: 05/29/2022 Distance walked: 200' Assistive device utilized: None Level of assistance: SBA Comments: shortened step length bil., flexed trunk, some M-L instability and near losses of balance  TODAY'S TREATMENT  06/13/2022 Neuromuscular Re-ed -Tandem stance on foam beam 1 x 30 seconds Bil. and on towel roll as HEP 1 x 30 sec Bil. -crossways stance on towel roll 30 sec with feet hip width;  head turns 5 reps ea direction right/left, up/down & diagonals. -braiding 3 laps with no LOB - rocking  back with recovery.  -BOSU round side up step up, cross contralateral LE ant & post across midline 5 reps ea LE -Forward lunges on BOSU 1x10 B  -Narrow BOS eyes closed 3x30 seconds -Toe taps on 3 cones lateral, anterior & across midline while standing on blue foam pad x10 each leg -squat on foam beam 3 reps -tandem stance on floor eyes closed 20 sec 2 reps ea. -standing on foam feet together 30 sec with intermittent touch  Therapeutic Activity / Gait   Working on increasing pace for 20'  4 reps  Scanning right/left, up/down & diagonals maintaining path & pace with tactile cues  Amb 3 steps eyes open & 3 steps eyes closed with tactile cues.   Naming animals A-Z for distraction while scanning    06/08/22  Nustep L5 x6 minutes BLEs only   -Tandem stance 3x30 seconds B  -Forward lunges on BOSU 1x10 B  -Narrow BOS eyes closed 3x30 seconds -Toe taps on BOSU while standing on blue foam pad x20 forward, then x10 sideways each leg -3 way toe taps on blue foam pad x10 B with stance knee slightly bent - backwards walking with horizontal head turns x3 laps (about 27f each) - sideways stepping with vertical head turns x2 laps (about 256feach)  06/06/2022 Therex: Nustep lvl 5 5 mins BLE/BUEs, 5 mins BLEs    Neuro Re-ed: Counter exercises added to HEP 10 reps at a  time, paced throughout day as indicated by fatigue -grapevine walking x 3 at counter -hip extension alternating x 10 -hip abduction alternating x 11 -standing marching alternating x 11 UE ranger flexion with anterior step and following hand with eyes and head x 10 bil. with some losses of balance requiring minA to recover Amb 1' with 20' x 2 of fast walking resembling crossing street Amb 90' with horizontal head turns and intermittent CGA Amb 90' with vertical head turns and intermittent CGA Amb 180' diagonal head turns (90' each way) with CGA Amb 180' with 3 steps eyes open 3 steps eyes closed 4" step up over down  alternating LEs with intermittent UE support Foam alternating SLE stance with 3 cone anterior tapping, intermittent UE support and CGA Foam stance horizontal, vertical, diagonal head turns eyes open and eyes closed with intermittent UE support and CGA    PATIENT EDUCATION:  06/01/2022 Education details: HEP Person educated: Patient Education method: Explanation, cues, handout Education comprehension: verbalized understanding, demonstration  HOME EXERCISE PROGRAM: Access Code: [email or web address] URL: [email or web address] Date: 06/06/2022 Prepared by: Jamey Reas  Exercises - Sit to Stand  - 3 x daily - 7 x weekly - 1 sets - 10 reps - Seated Straight Leg Heel Taps  - 1-2 x daily - 7 x weekly - 1-2 sets - 10 reps - Tandem Stance  - 1-2 x daily - 7 x weekly - 1 sets - 1-2 reps - 30- 60 sec hold - [place] with Counter Support  - 1-2 x daily - 7 x weekly - 1 sets - 10 reps - Standing hip extension alternating legs  - 1-2 x daily - 7 x weekly - 1 sets - 10 reps - 5 seconds hold - Standing hip abduction alternating legs  - 1-2 x daily - 7 x weekly - 1 sets - 10 reps - 5 seconds hold - Standing March with Counter Support  - 1-2 x daily - 7 x weekly - 1 sets - 10 reps - 5 seconds hold  ASSESSMENT:  CLINICAL IMPRESSION: Patient improved balance reactions with progressing difficulty of balance activities.  She was able to incorporate balance reactions into functional gait.  She continues to benefit from skilled PT.   OBJECTIVE IMPAIRMENTS Abnormal gait, decreased activity tolerance, decreased balance, decreased endurance, decreased mobility, difficulty walking, decreased ROM, decreased strength, postural dysfunction, and pain.   ACTIVITY LIMITATIONS carrying, lifting, bending, squatting, stairs, toileting, dressing, locomotion level, and caring for others  PARTICIPATION LIMITATIONS: meal prep, cleaning, laundry, driving, community activity, and yard work  [place],  Time since onset of injury/illness/exacerbation, and 3+ comorbidities: see PMH  are also affecting patient's functional outcome.   REHAB POTENTIAL: Good  CLINICAL DECISION MAKING: Evolving/moderate complexity  EVALUATION COMPLEXITY: Low   GOALS: Goals reviewed with patient? Yes  SHORT TERM GOALS: Target date: 06/23/22  Patient will be independent with HEP to continue progressing outside of clinic. Baseline: see objective data Goal status: on going - 06/06/2022  2.  Patient scores above a 18/30 on the FGA to represent reduced fall risk with gait activities. Baseline: see objective data Goal status: on going - 06/06/2022   LONG TERM GOALS: Target date: 07/21/22  Patient will score FOTO >/= 55% to reflect improved self-perceived functional ability. Baseline: see objective data Goal status: INITIAL   2.  Patient scores above a 25/30 on the FGA to represent low fall risk with gait activities. Baseline: see objective data Goal status: INITIAL  3.  Patient reports </=  2/10 back and knee pain with standing, gait, and stair activity. Baseline: see objective data Goal status: INITIAL  4.  Patient Rt knee seated LAQ to -2* to represent improved knee range and quadriceps strength. Baseline: see objective data Goal status: INITIAL  5.  Patient safely ambulates 500' including ramp, curb, and stairs independently to allow for household and community mobility. Baseline: see objective data Goal status: INITIAL  6.  Patient verbalizes and demonstrates ability to perform ADLs, household chores, and yardwork. Baseline: see objective data Goal status: INITIAL   PLAN: PT FREQUENCY: 2x/week  PT DURATION: 8 weeks  PLANNED INTERVENTIONS: Therapeutic exercises, Therapeutic activity, Neuromuscular re-education, Balance training, Gait training, Patient/Family education, Self Care, Joint mobilization, Stair training, Vestibular training, DME instructions, Dry Needling, Electrical stimulation,  Spinal mobilization, Cryotherapy, Moist heat, Ultrasound, Ionotophoresis '4mg'$ /ml Dexamethasone, Manual therapy, Re-evaluation, and physical performance tests .  PLAN FOR NEXT SESSION:  continue to progress neuromuscular re-education / balance activities,  try standing theraband hip exercises.     Jamey Reas, PT, DPT 06/13/2022, 4:46 PM

## 2022-06-15 ENCOUNTER — Ambulatory Visit (INDEPENDENT_AMBULATORY_CARE_PROVIDER_SITE_OTHER): Payer: Medicare Other | Admitting: Rehabilitative and Restorative Service Providers"

## 2022-06-15 ENCOUNTER — Encounter: Payer: Self-pay | Admitting: Rehabilitative and Restorative Service Providers"

## 2022-06-15 DIAGNOSIS — M5459 Other low back pain: Secondary | ICD-10-CM

## 2022-06-15 DIAGNOSIS — M25561 Pain in right knee: Secondary | ICD-10-CM

## 2022-06-15 DIAGNOSIS — R2689 Other abnormalities of gait and mobility: Secondary | ICD-10-CM

## 2022-06-15 DIAGNOSIS — M25661 Stiffness of right knee, not elsewhere classified: Secondary | ICD-10-CM

## 2022-06-15 DIAGNOSIS — R2681 Unsteadiness on feet: Secondary | ICD-10-CM

## 2022-06-15 DIAGNOSIS — M6281 Muscle weakness (generalized): Secondary | ICD-10-CM

## 2022-06-15 NOTE — Therapy (Signed)
OUTPATIENT PHYSICAL THERAPY TREATMENT   Patient Name: Katie Weber MRN: [id number] DOB:1946-04-18, 76 y.o., female Today's Date: 06/16/2022  END OF SESSION:   PT End of Session - 06/15/22 1510     Visit Number 6    Number of Visits 16    Date for PT Re-Evaluation 07/21/22    Authorization Type Medicare Part A and B    Progress Note Due on Visit 10    PT Start Time 1514    PT Stop Time 1555    PT Time Calculation (min) 41 min    Equipment Utilized During Treatment Gait belt    Activity Tolerance Patient tolerated treatment well    Behavior During Therapy WFL for tasks assessed/performed             Past Medical History:  Diagnosis Date   Arthritis    arthritis fingers   Coronary artery calcification 04/24/2017   Diverticulitis    x2 -last 1'17 tx. outpatient.   Environmental and seasonal allergies    2014 Possibly related to air freshners.   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   Family history of anesthesia complication 75IEP ago   daughter had an allergic reaction to atropine   GERD (gastroesophageal reflux disease)    History of hiatal hernia 09/14/2014   History of kidney stones    '80's lithotripsy-[facility] x1   Hyperlipidemia    Hypothyroidism    Pneumonia 2014   Pure hypercholesterolemia    Tremor of left hand    Past Surgical History:  Procedure Laterality Date   BALLOON DILATION N/A 05/04/2014   Procedure: BALLOON DILATION;  Surgeon: Garlan Fair, MD;  Location: [facility];  Service: Endoscopy;  Laterality: N/A;   BREAST LUMPECTOMY Right 1972   benign    BREAST SURGERY Right    '72 -benign tumor   childbirth- NVD x2      CHOLECYSTECTOMY N/A 03/10/2022   Procedure: LAPAROSCOPIC CHOLECYSTECTOMY;  Surgeon: Jovita Kussmaul, MD;  Location: [facility];  Service: General;  Laterality: N/A;   COLONOSCOPY WITH PROPOFOL N/A 05/04/2014   Procedure: COLONOSCOPY WITH PROPOFOL;  Surgeon: Garlan Fair, MD;  Location: [facility];  Service: Endoscopy;  Laterality: N/A;   DILATION AND CURETTAGE OF UTERUS     04-20-14 [facility] hospital endometrial polyp removed.   ESOPHAGOGASTRODUODENOSCOPY (EGD) WITH PROPOFOL N/A 05/04/2014   Procedure: ESOPHAGOGASTRODUODENOSCOPY (EGD) WITH PROPOFOL;  Surgeon: Garlan Fair, MD;  Location: [facility];  Service: Endoscopy;  Laterality: N/A;. Procedure done in [place], [place]- 5 yrs ago.   HYSTEROSCOPY WITH D & C N/A 04/20/2014   Procedure: DILATATION AND CURETTAGE /HYSTEROSCOPY;  Surgeon: [name], MD;  Location: [facility];  Service: Gynecology;  Laterality: N/A;   IR PERC CHOLECYSTOSTOMY  01/08/2022   IR RADIOLOGIST EVAL & MGMT  02/10/2022   LAPAROSCOPY N/A 03/14/2022   Procedure: LAPAROSCOPY DIAGNOSTIC  WITH IRRIGATION AND  DRAINAGE OF Lucia Bitter;  Surgeon: Jovita Kussmaul, MD;  Location: [facility];  Service: General;  Laterality: N/A;   LITHOTRIPSY     POLYPECTOMY  2020   Dr. Lear Ng   TOTAL KNEE ARTHROPLASTY Left 02/15/2021   Procedure: LEFT TOTAL KNEE ARTHROPLASTY;  Surgeon: Mcarthur Rossetti, MD;  Location: [facility];  Service: Orthopedics;  Laterality: Left;   TOTAL KNEE ARTHROPLASTY Right 08/09/2021   Procedure: RIGHT TOTAL KNEE ARTHROPLASTY;  Surgeon: Mcarthur Rossetti, MD;  Location: [facility];  Service: Orthopedics;  Laterality: Right;  Needs RNFA   TUBAL LIGATION     Patient Active Problem List   Diagnosis Date Noted   Hypovolemia due to dehydration 03/15/2022   AKI (acute kidney injury) ([place]) 03/15/2022   Biloma following surgery 03/14/2022   Abdominal pain 03/13/2022   Postoperative pain 03/13/2022   Acute cholecystitis 67/89/3810   Biliary colic 17/51/0258   New onset atrial flutter ([place]) 01/07/2022   DJD (degenerative joint disease) of knee 08/09/2021   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   Unilateral primary osteoarthritis, right knee 06/06/2021   Status post total left knee replacement 02/15/2021    Unilateral primary osteoarthritis, left knee 01/04/2021   Coronary artery calcification 04/24/2017   Hyperlipidemia 04/24/2017   Acute bronchitis 09/29/2014   SBO (small bowel obstruction) ([place]) 09/27/2014   Nausea and vomiting 09/27/2014   Hypothyroidism 09/27/2014   Leukocytosis 09/27/2014    PCP: Leeroy Cha, MD  REFERRING PROVIDER: Jovita Kussmaul, MD  REFERRING DIAG: T81.89XA,K66.8 (ICD-10-CM) - Biloma following surgery, initial encounter G89.18 (ICD-10-CM) - Postoperative pain  Rationale for Evaluation and Treatment Rehabilitation  THERAPY DIAG:  Unsteadiness on feet  Other abnormalities of gait and mobility  Muscle weakness (generalized)  Other low back pain  Acute pain of right knee  Stiffness of right knee, not elsewhere classified  ONSET DATE: 03/17/22 Referral  SUBJECTIVE:                                                                                                                                                                                           SUBJECTIVE STATEMENT: She is feeling really good. She doesn't have any pain, and has been practicing the balance exercises at home. She has been able to go up and down stairs with alternating pattern with a handrail with no pain.  PERTINENT HISTORY:  Cholecystectomy 03/10/22, laparoscopy 03/14/22 TKA Lt 5/22 Rt 11/22, hypothyroidism, hyperlipidemia, OA  PAIN:  NPRS scale:   0/10 Pain location:back Pain description:  Aggravating factors:  Relieving factors:    NPRS scale: 0/10  Pain location: Rt anterior knee and patella Pain description: soreness Aggravating factors: first getting up in the morning, bending Relieving factors: moving  PRECAUTIONS: None  WEIGHT BEARING RESTRICTIONS No  FALLS:  Has patient fallen in last 6 months? No  LIVING ENVIRONMENT: Lives with: lives with their spouse Lives in: House/apartment Stairs: Yes: Internal: 13 steps; on left going up and External: 2 steps;  can reach both Has following equipment at home: Single point cane, Walker - 2 wheeled, Manufacturing engineer  OCCUPATION: retired, for leisure she enjoys hiking  PLOF: Independent  PATIENT GOALS return to activity level prior to surgery without pain   OBJECTIVE:   PATIENT SURVEYS:  05/29/2022 FOTO  44%, target 55%  COGNITION: 05/29/2022 Overall cognitive status: Within functional limits for tasks assessed    SENSATION: 05/29/2022 Not tested  POSTURE:  05/29/2022 flexed trunk  and weight shift left  LUMBAR ROM:   Active AROM  05/29/2022  Flexion   Extension   Right lateral flexion   Left lateral flexion   Right rotation   Left rotation    (Blank rows = not tested)  LOWER EXTREMITY ROM:     ROM (A: AROM, P: PROM) Right Eval 05/29/2022 Left Eval 05/29/2022  Hip flexion    Hip extension    Hip abduction    Hip adduction    Hip internal rotation    Hip external rotation    Knee flexion    Knee extension Seated LAQ A: -8* Seated LAQ WNL  Ankle dorsiflexion    Ankle plantarflexion    Ankle inversion    Ankle eversion     (Blank rows = not tested)  LOWER EXTREMITY MMT:    MMT Right Eval 05/29/2022 Left Eval 05/29/2022  Hip flexion    Hip extension    Hip abduction    Hip adduction    Hip internal rotation    Hip external rotation    Knee flexion Seated  4/5 painful Seated  4/5  Knee extension Seated  4/5 painful Seated  5/5  Ankle dorsiflexion    Ankle plantarflexion    Ankle inversion    Ankle eversion     (Blank rows = not tested)   FUNCTIONAL TESTS:  05/29/2022 5 times sit to stand: 11.38 sec, no UE use  (cutoff for older adults is <12sec)  Functional gait assessment: 11/30   GAIT: 06/16/2022:  Independent ambulation into and through clinic between activity.  Less occurrences of lateral instability but still noted on occasion in stance phase SL for both LE.   05/29/2022 Distance walked: 200' Assistive device utilized: None Level of  assistance: SBA Comments: shortened step length bil., flexed trunk, some M-L instability and near losses of balance  TODAY'S TREATMENT  06/15/2022 Neuromuscular Re-ed -Tandem stance on foam beam 2 x 40 seconds Bil eyes open with intermittent UE support -sideways on foam beam hip width; head turns 5 reps ea direction right/left, up/down & diagonals and minA needed to recover 2 losses of balance -sideways on foam beam narrow BOS A-P weight shift -rocker board M-L and A-P x 10 ea. with frequent UE support -rocker board held A-P with mini squats with UE support -picking up cone from floor with across body reach to holder x 4  TherAct -Amb with SLE step on 4" step up and over, alternating x 5 ea. bil -20' walking with 8' sidestepping in middle x 3 bil. SBA -Weaving between cones 5' apart SBA -Amb 3 steps eyes open 3 steps eyes closed SBA -Scanning right/left, up/down & diagonals maintaining path & pace - increased sway with horizontal head turns -Naming [employer] for distraction during gait - increased sway  06/13/2022 Neuromuscular Re-ed -Tandem stance on foam beam 1 x 30 seconds Bil. and on towel roll as HEP 1 x 30 sec Bil. -crossways stance on towel roll 30 sec with feet hip width;  head turns 5 reps ea direction right/left, up/down & diagonals. -braiding 3 laps with no LOB - rocking back with recovery.  -BOSU round side up step up, cross contralateral LE ant &  post across midline 5 reps ea LE -Forward lunges on BOSU 1x10 B  -Narrow BOS eyes closed 3x30 seconds -Toe taps on 3 cones lateral, anterior & across midline while standing on blue foam pad x10 each leg -squat on foam beam 3 reps -tandem stance on floor eyes closed 20 sec 2 reps ea. -standing on foam feet together 30 sec with intermittent touch  Therapeutic Activity / Gait   Working on increasing pace for 20'  4 reps  Scanning right/left, up/down & diagonals maintaining path & pace with tactile cues  Amb 3 steps eyes open & 3  steps eyes closed with tactile cues.   Naming animals A-Z for distraction while scanning  06/08/22  Nustep L5 x6 minutes BLEs only   -Tandem stance 3x30 seconds B  -Forward lunges on BOSU 1x10 B  -Narrow BOS eyes closed 3x30 seconds -Toe taps on BOSU while standing on blue foam pad x20 forward, then x10 sideways each leg -3 way toe taps on blue foam pad x10 B with stance knee slightly bent - backwards walking with horizontal head turns x3 laps (about 64f each) - sideways stepping with vertical head turns x2 laps (about 226feach)   PATIENT EDUCATION:  06/01/2022 Education details: HEP Person educated: Patient Education method: Explanation, cues, handout Education comprehension: verbalized understanding, demonstration  HOME EXERCISE PROGRAM: Access Code: [email or web address] URL: [email or web address] Date: 06/06/2022 Prepared by: RoJamey ReasExercises - Sit to Stand  - 3 x daily - 7 x weekly - 1 sets - 10 reps - Seated Straight Leg Heel Taps  - 1-2 x daily - 7 x weekly - 1-2 sets - 10 reps - Tandem Stance  - 1-2 x daily - 7 x weekly - 1 sets - 1-2 reps - 30- 60 sec hold - Carioca with Counter Support  - 1-2 x daily - 7 x weekly - 1 sets - 10 reps - Standing hip extension alternating legs  - 1-2 x daily - 7 x weekly - 1 sets - 10 reps - 5 seconds hold - Standing hip abduction alternating legs  - 1-2 x daily - 7 x weekly - 1 sets - 10 reps - 5 seconds hold - Standing March with Counter Support  - 1-2 x daily - 7 x weekly - 1 sets - 10 reps - 5 seconds hold  ASSESSMENT:  CLINICAL IMPRESSION: She presented today feeling much better with no pain, feeling steadiness during gait and turning, and able to use stairs with alternating pattern. She is continuing to progress with balance activities and dynamic gait tasks in clinic and has greatly improved in steadiness and speed with turning and obstacle negotiation. She continues to benefit from skilled PT.  OBJECTIVE IMPAIRMENTS  Abnormal gait, decreased activity tolerance, decreased balance, decreased endurance, decreased mobility, difficulty walking, decreased ROM, decreased strength, postural dysfunction, and pain.   ACTIVITY LIMITATIONS carrying, lifting, bending, squatting, stairs, toileting, dressing, locomotion level, and caring for others  PARTICIPATION LIMITATIONS: meal prep, cleaning, laundry, driving, community activity, and yard work  PERardenTime since onset of injury/illness/exacerbation, and 3+ comorbidities: see PMH  are also affecting patient's functional outcome.   REHAB POTENTIAL: Good  CLINICAL DECISION MAKING: Evolving/moderate complexity  EVALUATION COMPLEXITY: Low   GOALS: Goals reviewed with patient? Yes  SHORT TERM GOALS: Target date: 06/23/22  Patient will be independent with HEP to continue progressing outside of clinic. Baseline: see objective data Goal status: on going - 06/06/2022  2.  Patient scores above a  18/30 on the FGA to represent reduced fall risk with gait activities. Baseline: see objective data Goal status: on going - 06/06/2022   LONG TERM GOALS: Target date: 07/21/22  Patient will score FOTO >/= 55% to reflect improved self-perceived functional ability. Baseline: see objective data Goal status: INITIAL   2.  Patient scores above a 25/30 on the FGA to represent low fall risk with gait activities. Baseline: see objective data Goal status: INITIAL  3.  Patient reports </= 2/10 back and knee pain with standing, gait, and stair activity. Baseline: see objective data Goal status: INITIAL  4.  Patient Rt knee seated LAQ to -2* to represent improved knee range and quadriceps strength. Baseline: see objective data Goal status: INITIAL  5.  Patient safely ambulates 500' including ramp, curb, and stairs independently to allow for household and community mobility. Baseline: see objective data Goal status: INITIAL  6.  Patient verbalizes and  demonstrates ability to perform ADLs, household chores, and yardwork. Baseline: see objective data Goal status: INITIAL   PLAN: PT FREQUENCY: 2x/week  PT DURATION: 8 weeks  PLANNED INTERVENTIONS: Therapeutic exercises, Therapeutic activity, Neuromuscular re-education, Balance training, Gait training, Patient/Family education, Self Care, Joint mobilization, Stair training, Vestibular training, DME instructions, Dry Needling, Electrical stimulation, Spinal mobilization, Cryotherapy, Moist heat, Ultrasound, Ionotophoresis '4mg'$ /ml Dexamethasone, Manual therapy, Re-evaluation, and physical performance tests .  PLAN FOR NEXT SESSION: Assess STG/LTG, standing theraband hip exercises  Jana Hakim, Student-PT 06/15/2022, 5:23 PM  This entire session of physical therapy was performed under the direct supervision of PT signing evaluation /treatment. PT reviewed note and agrees.  Scot Jun, PT, DPT, OCS, ATC 06/15/2022, 5:26 PM

## 2022-06-20 ENCOUNTER — Encounter: Payer: Self-pay | Admitting: Physical Therapy

## 2022-06-20 ENCOUNTER — Ambulatory Visit (INDEPENDENT_AMBULATORY_CARE_PROVIDER_SITE_OTHER): Payer: Medicare Other | Admitting: Physical Therapy

## 2022-06-20 DIAGNOSIS — R2689 Other abnormalities of gait and mobility: Secondary | ICD-10-CM | POA: Diagnosis not present

## 2022-06-20 DIAGNOSIS — M25561 Pain in right knee: Secondary | ICD-10-CM

## 2022-06-20 DIAGNOSIS — M5459 Other low back pain: Secondary | ICD-10-CM

## 2022-06-20 DIAGNOSIS — R2681 Unsteadiness on feet: Secondary | ICD-10-CM

## 2022-06-20 DIAGNOSIS — M6281 Muscle weakness (generalized): Secondary | ICD-10-CM

## 2022-06-20 DIAGNOSIS — M25661 Stiffness of right knee, not elsewhere classified: Secondary | ICD-10-CM

## 2022-06-20 NOTE — Therapy (Signed)
OUTPATIENT PHYSICAL THERAPY TREATMENT & PROGRESS NOTE   Patient Name: Katie Weber MRN: [id number] DOB:08-Jun-1946, 76 y.o., female Today's Date: 06/20/2022  Progress Note Reporting Period 05/29/22 to 06/20/22  See note below for Objective Data and Assessment of Progress/Goals.    END OF SESSION:  PT End of Session - 06/20/22 1430     Visit Number 7    Number of Visits 16    Date for PT Re-Evaluation 07/21/22    Authorization Type Medicare Part A and B    Progress Note Due on Visit 10    PT Start Time 1432    PT Stop Time 1518    PT Time Calculation (min) 46 min    Equipment Utilized During Treatment Gait belt    Activity Tolerance Patient tolerated treatment well    Behavior During Therapy WFL for tasks assessed/performed             Past Medical History:  Diagnosis Date   Arthritis    arthritis fingers   Coronary artery calcification 04/24/2017   Diverticulitis    x2 -last 1'17 tx. outpatient.   Environmental and seasonal allergies    2014 Possibly related to air freshners.   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   Family history of anesthesia complication 06CBJ ago   daughter had an allergic reaction to atropine   GERD (gastroesophageal reflux disease)    History of hiatal hernia 09/14/2014   History of kidney stones    '80's lithotripsy-[facility] x1   Hyperlipidemia    Hypothyroidism    Pneumonia 2014   Pure hypercholesterolemia    Tremor of left hand    Past Surgical History:  Procedure Laterality Date   BALLOON DILATION N/A 05/04/2014   Procedure: BALLOON DILATION;  Surgeon: Garlan Fair, MD;  Location: [facility];  Service: Endoscopy;  Laterality: N/A;   BREAST LUMPECTOMY Right 1972   benign    BREAST SURGERY Right    '72 -benign tumor   childbirth- NVD x2      CHOLECYSTECTOMY N/A 03/10/2022   Procedure: LAPAROSCOPIC CHOLECYSTECTOMY;  Surgeon: Jovita Kussmaul, MD;  Location: [facility];  Service: General;  Laterality: N/A;   COLONOSCOPY WITH PROPOFOL N/A 05/04/2014   Procedure: COLONOSCOPY WITH PROPOFOL;  Surgeon: Garlan Fair, MD;  Location: [facility];  Service: Endoscopy;  Laterality: N/A;   DILATION AND CURETTAGE OF UTERUS     04-20-14 [facility] endometrial polyp removed.   ESOPHAGOGASTRODUODENOSCOPY (EGD) WITH PROPOFOL N/A 05/04/2014   Procedure: ESOPHAGOGASTRODUODENOSCOPY (EGD) WITH PROPOFOL;  Surgeon: Garlan Fair, MD;  Location: [facility];  Service: Endoscopy;  Laterality: N/A;. Procedure done in [place], [place]- 5 yrs ago.   HYSTEROSCOPY WITH D & C N/A 04/20/2014   Procedure: DILATATION AND CURETTAGE /HYSTEROSCOPY;  Surgeon: [name], MD;  Location: [facility];  Service: Gynecology;  Laterality: N/A;   IR PERC CHOLECYSTOSTOMY  01/08/2022   IR RADIOLOGIST EVAL & MGMT  02/10/2022   LAPAROSCOPY N/A 03/14/2022   Procedure: LAPAROSCOPY DIAGNOSTIC  WITH IRRIGATION AND  DRAINAGE OF Lucia Bitter;  Surgeon: Jovita Kussmaul, MD;  Location: [facility];  Service: General;  Laterality: N/A;   LITHOTRIPSY     POLYPECTOMY  2020   Dr. Lear Ng   TOTAL KNEE ARTHROPLASTY Left 02/15/2021   Procedure: LEFT TOTAL KNEE ARTHROPLASTY;  Surgeon: Mcarthur Rossetti, MD;  Location: [facility];  Service: Orthopedics;  Laterality: Left;  TOTAL KNEE ARTHROPLASTY Right 08/09/2021   Procedure: RIGHT TOTAL KNEE ARTHROPLASTY;  Surgeon: Mcarthur Rossetti, MD;  Location: [facility];  Service: Orthopedics;  Laterality: Right;  Needs RNFA   TUBAL LIGATION     Patient Active Problem List   Diagnosis Date Noted   Hypovolemia due to dehydration 03/15/2022   AKI (acute kidney injury) ([place]) 03/15/2022   Biloma following surgery 03/14/2022   Abdominal pain 03/13/2022   Postoperative pain 03/13/2022   Acute cholecystitis 56/38/9373   Biliary colic 42/87/6811   New onset atrial flutter ([place]) 01/07/2022   DJD (degenerative joint disease) of knee 08/09/2021   Status post total right knee  replacement 08/09/2021   Unilateral primary osteoarthritis, right knee 06/06/2021   Status post total left knee replacement 02/15/2021   Unilateral primary osteoarthritis, left knee 01/04/2021   Coronary artery calcification 04/24/2017   Hyperlipidemia 04/24/2017   Acute bronchitis 09/29/2014   SBO (small bowel obstruction) ([place]) 09/27/2014   Nausea and vomiting 09/27/2014   Hypothyroidism 09/27/2014   Leukocytosis 09/27/2014    PCP: Leeroy Cha, MD  REFERRING PROVIDER: Jovita Kussmaul, MD  REFERRING DIAG: T81.89XA,K66.8 (ICD-10-CM) - Biloma following surgery, initial encounter G89.18 (ICD-10-CM) - Postoperative pain  Rationale for Evaluation and Treatment Rehabilitation  THERAPY DIAG:  Unsteadiness on feet  Other abnormalities of gait and mobility  Muscle weakness (generalized)  Other low back pain  Acute pain of right knee  Stiffness of right knee, not elsewhere classified  ONSET DATE: 03/17/22 Referral  SUBJECTIVE:                                                                                                                                                                                           SUBJECTIVE STATEMENT: She has been doing well, following a busy weekend. She has been walking and doing HEP with no concerns, but noting some increased knee pain today.  PERTINENT HISTORY:  Cholecystectomy 03/10/22, laparoscopy 03/14/22 TKA Lt 5/22 Rt 11/22, hypothyroidism, hyperlipidemia, OA  PAIN:  NPRS scale:   0/10 Pain location:back Pain description:  Aggravating factors:  Relieving factors:    NPRS scale: 5/10  Pain location: Rt anterior knee and patella Pain description: soreness Aggravating factors: first getting up in the morning, bending Relieving factors: moving  PRECAUTIONS: None  WEIGHT BEARING RESTRICTIONS No  FALLS:  Has patient fallen in last 6 months? No  LIVING ENVIRONMENT: Lives with: lives with their spouse Lives in:  House/apartment Stairs: Yes: Internal: 13 steps; on left going up and External: 2 steps; can reach both Has following equipment at home: Single point cane, Walker - 2 wheeled, Manufacturing engineer  OCCUPATION: retired, for leisure she enjoys hiking  PLOF: Independent  PATIENT GOALS return to activity level prior to surgery without pain   OBJECTIVE:   PATIENT SURVEYS:  06/20/2022 FOTO 53% 05/29/2022 FOTO  44%, target 55%  COGNITION: 05/29/2022 Overall cognitive status: Within functional limits for tasks assessed    SENSATION: 05/29/2022 Not tested  POSTURE:  05/29/2022 flexed trunk  and weight shift left  PALPATION: 06/20/2022 - tenderness to palpation at distal IT band, no tenderness at joint line  LUMBAR ROM:   Active AROM  05/29/2022  Flexion   Extension   Right lateral flexion   Left lateral flexion   Right rotation   Left rotation    (Blank rows = not tested)  LOWER EXTREMITY ROM:     ROM (A: AROM, P: PROM) Right Eval 05/29/2022 Left Eval 05/29/2022 Right 06/20/2022  Hip flexion     Hip extension     Hip abduction     Hip adduction     Hip internal rotation     Hip external rotation     Knee flexion     Knee extension Seated LAQ A: -8* Seated LAQ WNL Seated  LAQ A: -2*  Ankle dorsiflexion     Ankle plantarflexion     Ankle inversion     Ankle eversion      (Blank rows = not tested)  LOWER EXTREMITY MMT:    MMT Right Eval 05/29/2022 Left Eval 05/29/2022 Right 06/20/22 Left  06/20/22  Hip flexion      Hip extension      Hip abduction      Hip adduction      Hip internal rotation      Hip external rotation      Knee flexion Seated  4/5 painful Seated  4/5 Seated HH Dynameter 35.8# & 36.7# RLE:LLE 86.7% Seated HH Dynameter 40.8# & 43.1#  Knee extension Seated  4/5 painful Seated  5/5 Seated HH Dynameter 52.9# & 54.1# RLE:LLE 99.7% Seated HH dynameter 55.9# & 52.4#  Ankle dorsiflexion      Ankle plantarflexion      Ankle inversion       Ankle eversion       (Blank rows = not tested)  FUNCTIONAL TESTS:  06/20/2022 Functional gait assessment: 21/30  [facility] PT Assessment - 06/20/22 1430       Functional Gait  Assessment   Gait Level Surface Walks 20 ft in less than 7 sec but greater than 5.5 sec, uses assistive device, slower speed, mild gait deviations, or deviates 6-10 in outside of the 12 in walkway width.    Change in Gait Speed Able to change speed, demonstrates mild gait deviations, deviates 6-10 in outside of the 12 in walkway width, or no gait deviations, unable to achieve a major change in velocity, or uses a change in velocity, or uses an assistive device.    Gait with Horizontal Head Turns Performs head turns smoothly with slight change in gait velocity (eg, minor disruption to smooth gait path), deviates 6-10 in outside 12 in walkway width, or uses an assistive device.    Gait with Vertical Head Turns Performs task with slight change in gait velocity (eg, minor disruption to smooth gait path), deviates 6 - 10 in outside 12 in walkway width or uses assistive device    Gait and Pivot Turn Pivot turns safely in greater than 3 sec and stops with no loss of balance, or pivot turns safely within 3 sec and stops  with mild imbalance, requires small steps to catch balance.    Step Over Obstacle Is able to step over one shoe box (4.5 in total height) without changing gait speed. No evidence of imbalance.    Gait with Narrow Base of Support Is able to ambulate for 10 steps heel to toe with no staggering.    Gait with Eyes Closed Walks 20 ft, uses assistive device, slower speed, mild gait deviations, deviates 6-10 in outside 12 in walkway width. Ambulates 20 ft in less than 9 sec but greater than 7 sec.    Ambulating Backwards Walks 20 ft, uses assistive device, slower speed, mild gait deviations, deviates 6-10 in outside 12 in walkway width.    Steps Alternating feet, must use rail.    Total Score 21              05/29/2022 5 times sit to stand: 11.38 sec, no UE use  (cutoff for older adults is <12sec)  Functional gait assessment: 11/30  [facility] PT Assessment - 05/29/22 0001                Functional Gait  Assessment    Gait assessed  Yes     Gait Level Surface Walks 20 ft, slow speed, abnormal gait pattern, evidence for imbalance or deviates 10-15 in outside of the 12 in walkway width. Requires more than 7 sec to ambulate 20 ft.     Change in Gait Speed Makes only minor adjustments to walking speed, or accomplishes a change in speed with significant gait deviations, deviates 10-15 in outside the 12 in walkway width, or changes speed but loses balance but is able to recover and continue walking.     Gait with Horizontal Head Turns Performs head turns with moderate changes in gait velocity, slows down, deviates 10-15 in outside 12 in walkway width but recovers, can continue to walk.     Gait with Vertical Head Turns Performs task with moderate change in gait velocity, slows down, deviates 10-15 in outside 12 in walkway width but recovers, can continue to walk.     Gait and Pivot Turn Pivot turns safely in greater than 3 sec and stops with no loss of balance, or pivot turns safely within 3 sec and stops with mild imbalance, requires small steps to catch balance.     Step Over Obstacle Is able to step over one shoe box (4.5 in total height) but must slow down and adjust steps to clear box safely. May require verbal cueing.     Gait with Narrow Base of Support Ambulates less than 4 steps heel to toe or cannot perform without assistance.     Gait with Eyes Closed Walks 20 ft, slow speed, abnormal gait pattern, evidence for imbalance, deviates 10-15 in outside 12 in walkway width. Requires more than 9 sec to ambulate 20 ft.     Ambulating Backwards Walks 20 ft, uses assistive device, slower speed, mild gait deviations, deviates 6-10 in outside 12 in walkway width.     Steps Two feet to a stair, must use rail.      Total Score 11        GAIT: 06/16/2022:  Independent ambulation into and through clinic between activity.  Less occurrences of lateral instability but still noted on occasion in stance phase SL for both LE.   05/29/2022 Distance walked: 200' Assistive device utilized: None Level of assistance: SBA Comments: shortened step length bil., flexed trunk, some M-L instability and near losses  of balance  TODAY'S TREATMENT  06/20/2022 Physical performance tests: FGA as noted in objective  TherAct: ROM, dynamometer testing, and FOTO assessment as indicated in objective PT demo & verbal cues stand to/from floor transfer with chair and kneeling to pillow, pt returned demo x 1 PT explained verbal and visual - garden kneel bench to assist with transfers to gardening, pt verbalized understanding  06/15/2022 Neuromuscular Re-ed -Tandem stance on foam beam 2 x 40 seconds Bil eyes open with intermittent UE support -sideways on foam beam hip width; head turns 5 reps ea direction right/left, up/down & diagonals and minA needed to recover 2 losses of balance -sideways on foam beam narrow BOS A-P weight shift -rocker board M-L and A-P x 10 ea. with frequent UE support -rocker board held A-P with mini squats with UE support -picking up cone from floor with across body reach to holder x 4  TherAct -Amb with SLE step on 4" step up and over, alternating x 5 ea. bil -20' walking with 8' sidestepping in middle x 3 bil. SBA -Weaving between cones 5' apart SBA -Amb 3 steps eyes open 3 steps eyes closed SBA -Scanning right/left, up/down & diagonals maintaining path & pace - increased sway with horizontal head turns -Naming [employer] for distraction during gait - increased sway  06/13/2022 Neuromuscular Re-ed -Tandem stance on foam beam 1 x 30 seconds Bil. and on towel roll as HEP 1 x 30 sec Bil. -crossways stance on towel roll 30 sec with feet hip width;  head turns 5 reps ea direction right/left, up/down &  diagonals. -braiding 3 laps with no LOB - rocking back with recovery.  -BOSU round side up step up, cross contralateral LE ant & post across midline 5 reps ea LE -Forward lunges on BOSU 1x10 B  -Narrow BOS eyes closed 3x30 seconds -Toe taps on 3 cones lateral, anterior & across midline while standing on blue foam pad x10 each leg -squat on foam beam 3 reps -tandem stance on floor eyes closed 20 sec 2 reps ea. -standing on foam feet together 30 sec with intermittent touch  Therapeutic Activity / Gait   Working on increasing pace for 20'  4 reps  Scanning right/left, up/down & diagonals maintaining path & pace with tactile cues  Amb 3 steps eyes open & 3 steps eyes closed with tactile cues.   Naming animals A-Z for distraction while scanning  06/08/22  Nustep L5 x6 minutes BLEs only   -Tandem stance 3x30 seconds B  -Forward lunges on BOSU 1x10 B  -Narrow BOS eyes closed 3x30 seconds -Toe taps on BOSU while standing on blue foam pad x20 forward, then x10 sideways each leg -3 way toe taps on blue foam pad x10 B with stance knee slightly bent - backwards walking with horizontal head turns x3 laps (about 51f each) - sideways stepping with vertical head turns x2 laps (about 223feach)   PATIENT EDUCATION:  06/01/2022 Education details: HEP Person educated: Patient Education method: Explanation, cues, handout Education comprehension: verbalized understanding, demonstration  HOME EXERCISE PROGRAM: Access Code: [email or web address] URL: [email or web address] Date: 06/06/2022 Prepared by: RoJamey ReasExercises - Sit to Stand  - 3 x daily - 7 x weekly - 1 sets - 10 reps - Seated Straight Leg Heel Taps  - 1-2 x daily - 7 x weekly - 1-2 sets - 10 reps - Tandem Stance  - 1-2 x daily - 7 x weekly - 1 sets - 1-2 reps - 30- 60 sec hold -  Carioca with Counter Support  - 1-2 x daily - 7 x weekly - 1 sets - 10 reps - Standing hip extension alternating legs  - 1-2 x daily - 7 x weekly -  1 sets - 10 reps - 5 seconds hold - Standing hip abduction alternating legs  - 1-2 x daily - 7 x weekly - 1 sets - 10 reps - 5 seconds hold - Standing March with Counter Support  - 1-2 x daily - 7 x weekly - 1 sets - 10 reps - 5 seconds hold  ASSESSMENT:  CLINICAL IMPRESSION: In this 7 visit progress note period, she has progressed well in terms of her dynamic balance and pain. FGA score has improved from 11 to 21/30, surpassing the STG goal set for this time period but still not to LTG target. Her knee pain and back pain have improved, though she had a recent increase in knee pain prior to today's appointment. Knee extension AROM in LAQ position improved to -2* meeting LTG set for improved strength and knee extension dynamometer testing is 99.7% of the LT with knee flexion at 87% of Lt, further indicating improved Rt knee strength. She continues to benefit from skilled PT to address remaining deficits.  OBJECTIVE IMPAIRMENTS Abnormal gait, decreased activity tolerance, decreased balance, decreased endurance, decreased mobility, difficulty walking, decreased ROM, decreased strength, postural dysfunction, and pain.   ACTIVITY LIMITATIONS carrying, lifting, bending, squatting, stairs, toileting, dressing, locomotion level, and caring for others  PARTICIPATION LIMITATIONS: meal prep, cleaning, laundry, driving, community activity, and yard work  [place], Time since onset of injury/illness/exacerbation, and 3+ comorbidities: see PMH  are also affecting patient's functional outcome.   REHAB POTENTIAL: Good  CLINICAL DECISION MAKING: Evolving/moderate complexity  EVALUATION COMPLEXITY: Low   GOALS: Goals reviewed with patient? Yes  SHORT TERM GOALS: Target date: 06/23/22  Patient will be independent with HEP to continue progressing outside of clinic. Baseline: see objective data Goal status: met 06/20/22  2.  Patient scores above a 18/30 on the FGA to represent reduced fall  risk with gait activities. Baseline: see objective data Goal status: met 06/20/22   LONG TERM GOALS: Target date: 07/21/22  Patient will score FOTO >/= 55% to reflect improved self-perceived functional ability. Baseline: see objective data Goal status: INITIAL   2.  Patient scores above a 25/30 on the FGA to represent low fall risk with gait activities. Baseline: see objective data Goal status: INITIAL  3.  Patient reports </= 2/10 back and knee pain with standing, gait, and stair activity. Baseline: see objective data Goal status: INITIAL  4.  Patient Rt knee seated LAQ to -2* to represent improved knee range and quadriceps strength. Baseline: see objective data Goal status: met 06/20/22  5.  Patient safely ambulates 500' including ramp, curb, and stairs independently to allow for household and community mobility. Baseline: see objective data Goal status: INITIAL  6.  Patient verbalizes and demonstrates ability to perform ADLs, household chores, and yardwork. Baseline: see objective data Goal status: INITIAL   PLAN: PT FREQUENCY: 2x/week  PT DURATION: 8 weeks  PLANNED INTERVENTIONS: Therapeutic exercises, Therapeutic activity, Neuromuscular re-education, Balance training, Gait training, Patient/Family education, Self Care, Joint mobilization, Stair training, Vestibular training, DME instructions, Dry Needling, Electrical stimulation, Spinal mobilization, Cryotherapy, Moist heat, Ultrasound, Ionotophoresis 19m/ml Dexamethasone, Manual therapy, Re-evaluation, and physical performance tests .  PLAN FOR NEXT SESSION: standing theraband hip exercises, continue to progress dynamic balance in gait  KJana Hakim Student-PT 06/20/2022, 5:23 PM  This entire session of physical therapy was performed under the direct supervision of PT signing evaluation /treatment. PT reviewed note and agrees.  Jamey Reas, PT, DPT 06/20/2022, 5:26 PM

## 2022-06-21 ENCOUNTER — Ambulatory Visit: Payer: Self-pay

## 2022-06-21 ENCOUNTER — Encounter: Payer: Self-pay | Admitting: Orthopaedic Surgery

## 2022-06-21 ENCOUNTER — Ambulatory Visit (INDEPENDENT_AMBULATORY_CARE_PROVIDER_SITE_OTHER): Payer: Medicare Other

## 2022-06-21 ENCOUNTER — Ambulatory Visit (INDEPENDENT_AMBULATORY_CARE_PROVIDER_SITE_OTHER): Payer: Medicare Other | Admitting: Orthopaedic Surgery

## 2022-06-21 DIAGNOSIS — Z96653 Presence of artificial knee joint, bilateral: Secondary | ICD-10-CM | POA: Diagnosis not present

## 2022-06-21 NOTE — Progress Notes (Signed)
The patient is here today for follow-up as a relates to both her knee replacements.  Her left knee was replaced in May 2022 and her right knee was replaced in November 2022.  She is still having some problems with the right knee hurting on the lateral aspect of her knee.  She had setbacks through the therapy process after having to have abdominal surgery earlier this year.  She is 76 years old and still recovering from that surgery.  She is back in physical therapy now.  She walks with just only a slight limp.  Both knees are examined and feel ligamentously stable with good range of motion.  She lacks full extension on her right knee by just a few degrees.  X-rays of both knees show well-seated implants from total knee arthroplasties with no complicating features.  I agree with her continued physical therapy to strengthen her knees.  This can take time to get better relief.  I have recommended Voltaren gel at least to massage into the lateral aspect of the right knee least 2-3 times a day and that may help her as well.  All questions and concerns were answered and addressed.  We will see her back in 6 months unless there are issues.  At that visit we will have a final standing AP and lateral both knees.

## 2022-06-22 ENCOUNTER — Encounter: Payer: Medicare Other | Admitting: Rehabilitative and Restorative Service Providers"

## 2022-06-22 ENCOUNTER — Ambulatory Visit (INDEPENDENT_AMBULATORY_CARE_PROVIDER_SITE_OTHER): Payer: Medicare Other | Admitting: Rehabilitative and Restorative Service Providers"

## 2022-06-22 ENCOUNTER — Encounter: Payer: Self-pay | Admitting: Rehabilitative and Restorative Service Providers"

## 2022-06-22 DIAGNOSIS — R2689 Other abnormalities of gait and mobility: Secondary | ICD-10-CM | POA: Diagnosis not present

## 2022-06-22 DIAGNOSIS — M25561 Pain in right knee: Secondary | ICD-10-CM

## 2022-06-22 DIAGNOSIS — M6281 Muscle weakness (generalized): Secondary | ICD-10-CM | POA: Diagnosis not present

## 2022-06-22 DIAGNOSIS — M25661 Stiffness of right knee, not elsewhere classified: Secondary | ICD-10-CM

## 2022-06-22 DIAGNOSIS — R2681 Unsteadiness on feet: Secondary | ICD-10-CM

## 2022-06-22 DIAGNOSIS — M5459 Other low back pain: Secondary | ICD-10-CM

## 2022-06-22 NOTE — Therapy (Signed)
OUTPATIENT PHYSICAL THERAPY TREATMENT   Patient Name: Katie Weber MRN: [id number] DOB:1946/10/01, 76 y.o., female Today's Date: 06/22/2022  END OF SESSION:  PT End of Session - 06/22/22 1444     Visit Number 8    Number of Visits 16    Date for PT Re-Evaluation 07/21/22    Authorization Type Medicare Part A and B    Progress Note Due on Visit 17    PT Start Time 1430    PT Stop Time 1516    PT Time Calculation (min) 46 min    Equipment Utilized During Treatment Gait belt    Activity Tolerance Patient tolerated treatment well    Behavior During Therapy WFL for tasks assessed/performed              Past Medical History:  Diagnosis Date   Arthritis    arthritis fingers   Coronary artery calcification 04/24/2017   Diverticulitis    x2 -last 1'17 tx. outpatient.   Environmental and seasonal allergies    2014 Possibly related to air freshners.   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   Family history of anesthesia complication 79TJQ ago   daughter had an allergic reaction to atropine   GERD (gastroesophageal reflux disease)    History of hiatal hernia 09/14/2014   History of kidney stones    '80's lithotripsy-[facility] x1   Hyperlipidemia    Hypothyroidism    Pneumonia 2014   Pure hypercholesterolemia    Tremor of left hand    Past Surgical History:  Procedure Laterality Date   BALLOON DILATION N/A 05/04/2014   Procedure: BALLOON DILATION;  Surgeon: Garlan Fair, MD;  Location: [facility];  Service: Endoscopy;  Laterality: N/A;   BREAST LUMPECTOMY Right 1972   benign    BREAST SURGERY Right    '72 -benign tumor   childbirth- NVD x2      CHOLECYSTECTOMY N/A 03/10/2022   Procedure: LAPAROSCOPIC CHOLECYSTECTOMY;  Surgeon: Jovita Kussmaul, MD;  Location: [facility];  Service: General;  Laterality: N/A;   COLONOSCOPY WITH PROPOFOL N/A 05/04/2014   Procedure: COLONOSCOPY WITH PROPOFOL;  Surgeon: Garlan Fair, MD;  Location: [facility];  Service: Endoscopy;  Laterality: N/A;   DILATION AND CURETTAGE OF UTERUS     04-20-14 [facility] hospital endometrial polyp removed.   ESOPHAGOGASTRODUODENOSCOPY (EGD) WITH PROPOFOL N/A 05/04/2014   Procedure: ESOPHAGOGASTRODUODENOSCOPY (EGD) WITH PROPOFOL;  Surgeon: Garlan Fair, MD;  Location: [facility];  Service: Endoscopy;  Laterality: N/A;. Procedure done in [place], [place]- 5 yrs ago.   HYSTEROSCOPY WITH D & C N/A 04/20/2014   Procedure: DILATATION AND CURETTAGE /HYSTEROSCOPY;  Surgeon: [name], MD;  Location: [facility];  Service: Gynecology;  Laterality: N/A;   IR PERC CHOLECYSTOSTOMY  01/08/2022   IR RADIOLOGIST EVAL & MGMT  02/10/2022   LAPAROSCOPY N/A 03/14/2022   Procedure: LAPAROSCOPY DIAGNOSTIC  WITH IRRIGATION AND  DRAINAGE OF Lucia Bitter;  Surgeon: Jovita Kussmaul, MD;  Location: [facility];  Service: General;  Laterality: N/A;   LITHOTRIPSY     POLYPECTOMY  2020   Dr. Lear Ng   TOTAL KNEE ARTHROPLASTY Left 02/15/2021   Procedure: LEFT TOTAL KNEE ARTHROPLASTY;  Surgeon: Mcarthur Rossetti, MD;  Location: [facility];  Service: Orthopedics;  Laterality: Left;   TOTAL KNEE ARTHROPLASTY Right 08/09/2021   Procedure: RIGHT TOTAL KNEE ARTHROPLASTY;  Surgeon: Mcarthur Rossetti, MD;  Location: [facility];  Service: Orthopedics;  Laterality: Right;  Needs RNFA   TUBAL LIGATION     Patient Active Problem List   Diagnosis Date Noted   Hypovolemia due to dehydration 03/15/2022   AKI (acute kidney injury) ([place]) 03/15/2022   Biloma following surgery 03/14/2022   Abdominal pain 03/13/2022   Postoperative pain 03/13/2022   Acute cholecystitis 16/94/5038   Biliary colic 88/28/0034   New onset atrial flutter ([place]) 01/07/2022   DJD (degenerative joint disease) of knee 08/09/2021   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   Unilateral primary osteoarthritis, right knee 06/06/2021   Status post total left knee replacement 02/15/2021    Unilateral primary osteoarthritis, left knee 01/04/2021   Coronary artery calcification 04/24/2017   Hyperlipidemia 04/24/2017   Acute bronchitis 09/29/2014   SBO (small bowel obstruction) ([place]) 09/27/2014   Nausea and vomiting 09/27/2014   Hypothyroidism 09/27/2014   Leukocytosis 09/27/2014    PCP: Leeroy Cha, MD  REFERRING PROVIDER: Jovita Kussmaul, MD  REFERRING DIAG: T81.89XA,K66.8 (ICD-10-CM) - Biloma following surgery, initial encounter G89.18 (ICD-10-CM) - Postoperative pain  Rationale for Evaluation and Treatment Rehabilitation  THERAPY DIAG:  Unsteadiness on feet  Other abnormalities of gait and mobility  Muscle weakness (generalized)  Other low back pain  Acute pain of right knee  Stiffness of right knee, not elsewhere classified  ONSET DATE: 03/17/22 Referral  SUBJECTIVE:                                                                                                                                                                                           SUBJECTIVE STATEMENT: She saw Dr. [name] and started using voltarin gel prior to PT appointment with immediate relief. She has stiffness in the knee.  PERTINENT HISTORY:  Cholecystectomy 03/10/22, laparoscopy 03/14/22 TKA Lt 5/22 Rt 11/22, hypothyroidism, hyperlipidemia, OA  PAIN:  NPRS scale: 0/10 Pain location:back Pain description:  Aggravating factors:  Relieving factors:    NPRS scale: 0/10  Pain location: Rt anterior knee and patella Pain description: soreness Aggravating factors: first getting up in the morning, bending Relieving factors: moving  PRECAUTIONS: None  WEIGHT BEARING RESTRICTIONS No  FALLS:  Has patient fallen in last 6 months? No  LIVING ENVIRONMENT: Lives with: lives with their spouse Lives in: House/apartment Stairs: Yes: Internal: 13 steps; on left going up and External: 2 steps; can reach both Has following equipment at home: Single point cane, Walker - 2  wheeled, Manufacturing engineer  OCCUPATION: retired, for leisure she enjoys hiking  PLOF: [place] return to activity level prior to surgery without pain   OBJECTIVE:   PATIENT SURVEYS:  06/20/2022 FOTO 53% 05/29/2022 FOTO  44%, target 55%  COGNITION: 05/29/2022 Overall cognitive status: Within functional limits for tasks assessed    SENSATION: 05/29/2022 Not tested  POSTURE:  05/29/2022 flexed trunk  and weight shift left  PALPATION: 06/20/2022 - tenderness to palpation at distal IT band, no tenderness at joint line  LUMBAR ROM:   Active AROM  05/29/2022  Flexion   Extension   Right lateral flexion   Left lateral flexion   Right rotation   Left rotation    (Blank rows = not tested)  LOWER EXTREMITY ROM:     ROM (A: AROM, P: PROM) Right Eval 05/29/2022 Left Eval 05/29/2022 Right 06/20/2022  Hip flexion     Hip extension     Hip abduction     Hip adduction     Hip internal rotation     Hip external rotation     Knee flexion     Knee extension Seated LAQ A: -8* Seated LAQ WNL Seated  LAQ A: -2*  Ankle dorsiflexion     Ankle plantarflexion     Ankle inversion     Ankle eversion      (Blank rows = not tested)  LOWER EXTREMITY MMT:    MMT Right Eval 05/29/2022 Left Eval 05/29/2022 Right 06/20/22 Left  06/20/22  Hip flexion      Hip extension      Hip abduction      Hip adduction      Hip internal rotation      Hip external rotation      Knee flexion Seated  4/5 painful Seated  4/5 Seated HH Dynameter 35.8# & 36.7# RLE:LLE 86.7% Seated HH Dynameter 40.8# & 43.1#  Knee extension Seated  4/5 painful Seated  5/5 Seated HH Dynameter 52.9# & 54.1# RLE:LLE 99.7% Seated HH dynameter 55.9# & 52.4#  Ankle dorsiflexion      Ankle plantarflexion      Ankle inversion      Ankle eversion       (Blank rows = not tested)  FUNCTIONAL TESTS:  06/20/2022 Functional gait assessment: 21/30    05/29/2022 5 times sit to stand: 11.38 sec,  no UE use  (cutoff for older adults is <12sec)  Functional gait assessment: 11/30  [facility] PT Assessment - 05/29/22 0001                Functional Gait  Assessment    Gait assessed  Yes     Gait Level Surface Walks 20 ft, slow speed, abnormal gait pattern, evidence for imbalance or deviates 10-15 in outside of the 12 in walkway width. Requires more than 7 sec to ambulate 20 ft.     Change in Gait Speed Makes only minor adjustments to walking speed, or accomplishes a change in speed with significant gait deviations, deviates 10-15 in outside the 12 in walkway width, or changes speed but loses balance but is able to recover and continue walking.     Gait with Horizontal Head Turns Performs head turns with moderate changes in gait velocity, slows down, deviates 10-15 in outside 12 in walkway width but recovers, can continue to walk.     Gait with Vertical Head Turns Performs task with moderate change in gait velocity, slows down, deviates 10-15 in outside 12 in walkway width but recovers, can continue to walk.     Gait and Pivot Turn Pivot turns safely in greater than 3 sec and stops with no loss of balance, or pivot turns  safely within 3 sec and stops with mild imbalance, requires small steps to catch balance.     Step Over Obstacle Is able to step over one shoe box (4.5 in total height) but must slow down and adjust steps to clear box safely. May require verbal cueing.     Gait with Narrow Base of Support Ambulates less than 4 steps heel to toe or cannot perform without assistance.     Gait with Eyes Closed Walks 20 ft, slow speed, abnormal gait pattern, evidence for imbalance, deviates 10-15 in outside 12 in walkway width. Requires more than 9 sec to ambulate 20 ft.     Ambulating Backwards Walks 20 ft, uses assistive device, slower speed, mild gait deviations, deviates 6-10 in outside 12 in walkway width.     Steps Two feet to a stair, must use rail.     Total Score 11        GAIT: 06/16/2022:   Independent ambulation into and through clinic between activity.  Less occurrences of lateral instability but still noted on occasion in stance phase SL for both LE.   05/29/2022 Distance walked: 200' Assistive device utilized: None Level of assistance: SBA Comments: shortened step length bil., flexed trunk, some M-L instability and near losses of balance  TODAY'S TREATMENT  06/22/2022 TherEx: -Standing 4 way hip motions with red theraband x 10 bil., increased fatigue with LLE stance, PT explained set up for home use with pt verbalized understanding -Standing IT band stretch 2 x 20s bil. -Seated SLR x 10 bil with additional cueing for technique  Neuro Re-ed: -Foam normal BOS vertical, horizontal, diagonal head turns x 8 ea -Foam normal BOS with A-P weight shifting x 2 min -Foam beam tandem walking 8' forward and backward x 5 ea., frequent UE support to maintain stability  TherAct: -63' and 30' walking with horizontal head turns, one instance of minA to maintain balance -38' walking with vertical head nods SBA -30' walking with eyes open and closed 3 steps ea. SBA -75' walking eyes open and closed 5 steps ea. SBA -54' with cues for 2 x 10' increased pace SBA  06/20/2022 Physical performance tests: FGA as noted in objective  TherAct: ROM, dynamometer testing, and FOTO assessment as indicated in objective PT demo & verbal cues stand to/from floor transfer with chair and kneeling to pillow, pt returned demo x 1 PT explained verbal and visual - garden kneel bench to assist with transfers to gardening, pt verbalized understanding  06/15/2022 Neuromuscular Re-ed -Tandem stance on foam beam 2 x 40 seconds Bil eyes open with intermittent UE support -sideways on foam beam hip width; head turns 5 reps ea direction right/left, up/down & diagonals and minA needed to recover 2 losses of balance -sideways on foam beam narrow BOS A-P weight shift -rocker board M-L and A-P x 10 ea. with frequent  UE support -rocker board held A-P with mini squats with UE support -picking up cone from floor with across body reach to holder x 4  TherAct -Amb with SLE step on 4" step up and over, alternating x 5 ea. bil -20' walking with 8' sidestepping in middle x 3 bil. SBA -Weaving between cones 5' apart SBA -Amb 3 steps eyes open 3 steps eyes closed SBA -Scanning right/left, up/down & diagonals maintaining path & pace - increased sway with horizontal head turns -Naming [employer] for distraction during gait - increased sway  06/13/2022 Neuromuscular Re-ed -Tandem stance on foam beam 1 x 30 seconds Bil. and  on towel roll as HEP 1 x 30 sec Bil. -crossways stance on towel roll 30 sec with feet hip width;  head turns 5 reps ea direction right/left, up/down & diagonals. -braiding 3 laps with no LOB - rocking back with recovery.  -BOSU round side up step up, cross contralateral LE ant & post across midline 5 reps ea LE -Forward lunges on BOSU 1x10 B  -Narrow BOS eyes closed 3x30 seconds -Toe taps on 3 cones lateral, anterior & across midline while standing on blue foam pad x10 each leg -squat on foam beam 3 reps -tandem stance on floor eyes closed 20 sec 2 reps ea. -standing on foam feet together 30 sec with intermittent touch  Therapeutic Activity / Gait   Working on increasing pace for 20'  4 reps  Scanning right/left, up/down & diagonals maintaining path & pace with tactile cues  Amb 3 steps eyes open & 3 steps eyes closed with tactile cues.   Naming animals A-Z for distraction while scanning   PATIENT EDUCATION:  06/01/2022 Education details: HEP Person educated: Patient Education method: Explanation, cues, handout Education comprehension: verbalized understanding, demonstration  HOME EXERCISE PROGRAM: Access Code: [email or web address] URL: [email or web address] Date: 06/06/2022 Prepared by: Jamey Reas  Exercises - Sit to Stand  - 3 x daily - 7 x weekly - 1 sets - 10 reps -  Seated Straight Leg Heel Taps  - 1-2 x daily - 7 x weekly - 1-2 sets - 10 reps - Tandem Stance  - 1-2 x daily - 7 x weekly - 1 sets - 1-2 reps - 30- 60 sec hold - Carioca with Counter Support  - 1-2 x daily - 7 x weekly - 1 sets - 10 reps - Standing hip extension alternating legs  - 1-2 x daily - 7 x weekly - 1 sets - 10 reps - 5 seconds hold - Standing hip abduction alternating legs  - 1-2 x daily - 7 x weekly - 1 sets - 10 reps - 5 seconds hold - Standing March with Counter Support  - 1-2 x daily - 7 x weekly - 1 sets - 10 reps - 5 seconds hold  ASSESSMENT:  CLINICAL IMPRESSION: She presented today with no sx with use of Voltarin gel. HEP was reviewed with some additional cueing for SLR exercise. She was advised to begin the standing theraband hip exercises at home and add the standing IT band stretch, both with instructions for technique and safe set-up. She continues to benefit from skilled PT to address strength, gait, mobility, and balance impairments.  OBJECTIVE IMPAIRMENTS Abnormal gait, decreased activity tolerance, decreased balance, decreased endurance, decreased mobility, difficulty walking, decreased ROM, decreased strength, postural dysfunction, and pain.   ACTIVITY LIMITATIONS carrying, lifting, bending, squatting, stairs, toileting, dressing, locomotion level, and caring for others  PARTICIPATION LIMITATIONS: meal prep, cleaning, laundry, driving, community activity, and yard work  Nathalie, Time since onset of injury/illness/exacerbation, and 3+ comorbidities: see PMH  are also affecting patient's functional outcome.   REHAB POTENTIAL: Good  CLINICAL DECISION MAKING: Evolving/moderate complexity  EVALUATION COMPLEXITY: Low   GOALS: Goals reviewed with patient? Yes  SHORT TERM GOALS: Target date: 06/23/22  Patient will be independent with HEP to continue progressing outside of clinic. Baseline: see objective data Goal status: met 06/20/22  2.  Patient  scores above a 18/30 on the FGA to represent reduced fall risk with gait activities. Baseline: see objective data Goal status: met 06/20/22   LONG TERM GOALS: Target date:  07/21/22  Patient will score FOTO >/= 55% to reflect improved self-perceived functional ability. Baseline: see objective data Goal status: INITIAL   2.  Patient scores above a 25/30 on the FGA to represent low fall risk with gait activities. Baseline: see objective data Goal status: INITIAL  3.  Patient reports </= 2/10 back and knee pain with standing, gait, and stair activity. Baseline: see objective data Goal status: INITIAL  4.  Patient Rt knee seated LAQ to -2* to represent improved knee range and quadriceps strength. Baseline: see objective data Goal status: met 06/20/22  5.  Patient safely ambulates 500' including ramp, curb, and stairs independently to allow for household and community mobility. Baseline: see objective data Goal status: INITIAL  6.  Patient verbalizes and demonstrates ability to perform ADLs, household chores, and yardwork. Baseline: see objective data Goal status: INITIAL   PLAN: PT FREQUENCY: 2x/week  PT DURATION: 8 weeks  PLANNED INTERVENTIONS: Therapeutic exercises, Therapeutic activity, Neuromuscular re-education, Balance training, Gait training, Patient/Family education, Self Care, Joint mobilization, Stair training, Vestibular training, DME instructions, Dry Needling, Electrical stimulation, Spinal mobilization, Cryotherapy, Moist heat, Ultrasound, Ionotophoresis 63m/ml Dexamethasone, Manual therapy, Re-evaluation, and physical performance tests .  PLAN FOR NEXT SESSION: continue to progress dynamic balance tasks, add step concentric/eccentric with reducing UE support   KJana Hakim Student-PT 06/22/2022, 3:50 PM  This entire session of physical therapy was performed under the direct supervision of PT signing evaluation /treatment. PT reviewed note and agrees.  MScot Jun PT, DPT, OCS, ATC 06/22/2022, 5:26 PM

## 2022-06-27 ENCOUNTER — Encounter: Payer: Medicare Other | Admitting: Physical Therapy

## 2022-06-29 ENCOUNTER — Encounter: Payer: Medicare Other | Admitting: Rehabilitative and Restorative Service Providers"

## 2022-06-29 ENCOUNTER — Ambulatory Visit (INDEPENDENT_AMBULATORY_CARE_PROVIDER_SITE_OTHER): Payer: Medicare Other | Admitting: Rehabilitative and Restorative Service Providers"

## 2022-06-29 ENCOUNTER — Encounter: Payer: Medicare Other | Admitting: Physical Therapy

## 2022-06-29 ENCOUNTER — Encounter: Payer: Self-pay | Admitting: Rehabilitative and Restorative Service Providers"

## 2022-06-29 DIAGNOSIS — M6281 Muscle weakness (generalized): Secondary | ICD-10-CM

## 2022-06-29 DIAGNOSIS — R2689 Other abnormalities of gait and mobility: Secondary | ICD-10-CM | POA: Diagnosis not present

## 2022-06-29 DIAGNOSIS — R2681 Unsteadiness on feet: Secondary | ICD-10-CM | POA: Diagnosis not present

## 2022-06-29 DIAGNOSIS — M25661 Stiffness of right knee, not elsewhere classified: Secondary | ICD-10-CM

## 2022-06-29 DIAGNOSIS — M25561 Pain in right knee: Secondary | ICD-10-CM

## 2022-06-29 DIAGNOSIS — M5459 Other low back pain: Secondary | ICD-10-CM

## 2022-06-29 NOTE — Therapy (Signed)
OUTPATIENT PHYSICAL THERAPY TREATMENT   Patient Name: Katie Weber MRN: [id number] DOB:1946/08/19, 76 y.o., female Today's Date: 06/29/2022  END OF SESSION:  PT End of Session - 06/29/22 0938     Visit Number 9    Number of Visits 16    Date for PT Re-Evaluation 07/21/22    Authorization Type Medicare Part A and B    Progress Note Due on Visit 17    PT Start Time 0930    PT Stop Time 1009    PT Time Calculation (min) 39 min    Equipment Utilized During Treatment --    Activity Tolerance Patient tolerated treatment well    Behavior During Therapy WFL for tasks assessed/performed               Past Medical History:  Diagnosis Date   Arthritis    arthritis fingers   Coronary artery calcification 04/24/2017   Diverticulitis    x2 -last 1'17 tx. outpatient.   Environmental and seasonal allergies    2014 Possibly related to air freshners.   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   Family history of anesthesia complication 00QQP ago   daughter had an allergic reaction to atropine   GERD (gastroesophageal reflux disease)    History of hiatal hernia 09/14/2014   History of kidney stones    '80's lithotripsy-[facility] x1   Hyperlipidemia    Hypothyroidism    Pneumonia 2014   Pure hypercholesterolemia    Tremor of left hand    Past Surgical History:  Procedure Laterality Date   BALLOON DILATION N/A 05/04/2014   Procedure: BALLOON DILATION;  Surgeon: Garlan Fair, MD;  Location: [facility];  Service: Endoscopy;  Laterality: N/A;   BREAST LUMPECTOMY Right 1972   benign    BREAST SURGERY Right    '72 -benign tumor   childbirth- NVD x2      CHOLECYSTECTOMY N/A 03/10/2022   Procedure: LAPAROSCOPIC CHOLECYSTECTOMY;  Surgeon: Jovita Kussmaul, MD;  Location: [facility];  Service: General;  Laterality: N/A;   COLONOSCOPY WITH PROPOFOL N/A 05/04/2014   Procedure: COLONOSCOPY WITH PROPOFOL;  Surgeon: Garlan Fair, MD;  Location: [facility];  Service: Endoscopy;  Laterality: N/A;   DILATION AND CURETTAGE OF UTERUS     04-20-14 [facility] hospital endometrial polyp removed.   ESOPHAGOGASTRODUODENOSCOPY (EGD) WITH PROPOFOL N/A 05/04/2014   Procedure: ESOPHAGOGASTRODUODENOSCOPY (EGD) WITH PROPOFOL;  Surgeon: Garlan Fair, MD;  Location: [facility];  Service: Endoscopy;  Laterality: N/A;. Procedure done in [place], [place]- 5 yrs ago.   HYSTEROSCOPY WITH D & C N/A 04/20/2014   Procedure: DILATATION AND CURETTAGE /HYSTEROSCOPY;  Surgeon: [name], MD;  Location: [facility];  Service: Gynecology;  Laterality: N/A;   IR PERC CHOLECYSTOSTOMY  01/08/2022   IR RADIOLOGIST EVAL & MGMT  02/10/2022   LAPAROSCOPY N/A 03/14/2022   Procedure: LAPAROSCOPY DIAGNOSTIC  WITH IRRIGATION AND  DRAINAGE OF Lucia Bitter;  Surgeon: Jovita Kussmaul, MD;  Location: [facility];  Service: General;  Laterality: N/A;   LITHOTRIPSY     POLYPECTOMY  2020   Dr. Lear Ng   TOTAL KNEE ARTHROPLASTY Left 02/15/2021   Procedure: LEFT TOTAL KNEE ARTHROPLASTY;  Surgeon: Mcarthur Rossetti, MD;  Location: [facility];  Service: Orthopedics;  Laterality: Left;   TOTAL KNEE ARTHROPLASTY Right 08/09/2021   Procedure: RIGHT TOTAL KNEE ARTHROPLASTY;  Surgeon: Mcarthur Rossetti, MD;  Location: [facility];  Service: Orthopedics;  Laterality: Right;  Needs RNFA   TUBAL LIGATION     Patient Active Problem List   Diagnosis Date Noted   Hypovolemia due to dehydration 03/15/2022   AKI (acute kidney injury) ([place]) 03/15/2022   Biloma following surgery 03/14/2022   Abdominal pain 03/13/2022   Postoperative pain 03/13/2022   Acute cholecystitis 85/63/1497   Biliary colic 02/63/7858   New onset atrial flutter ([place]) 01/07/2022   DJD (degenerative joint disease) of knee 08/09/2021   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   Unilateral primary osteoarthritis, right knee 06/06/2021   Status post total left knee replacement 02/15/2021    Unilateral primary osteoarthritis, left knee 01/04/2021   Coronary artery calcification 04/24/2017   Hyperlipidemia 04/24/2017   Acute bronchitis 09/29/2014   SBO (small bowel obstruction) ([place]) 09/27/2014   Nausea and vomiting 09/27/2014   Hypothyroidism 09/27/2014   Leukocytosis 09/27/2014    PCP: Leeroy Cha, MD  REFERRING PROVIDER: Jovita Kussmaul, MD  REFERRING DIAG: T81.89XA,K66.8 (ICD-10-CM) - Biloma following surgery, initial encounter G89.18 (ICD-10-CM) - Postoperative pain  Rationale for Evaluation and Treatment Rehabilitation  THERAPY DIAG:  Unsteadiness on feet  Other abnormalities of gait and mobility  Muscle weakness (generalized)  Other low back pain  Acute pain of right knee  Stiffness of right knee, not elsewhere classified  ONSET DATE: 03/17/22 Referral  SUBJECTIVE:                                                                                                                                                                                           SUBJECTIVE STATEMENT: Pt indicated having hip pain on Lt when waking that led to cancelling the last appointment time.  Pt indicated it was better today.  Reported Rt knee stiffness and some pain to 4/10 today.   PERTINENT HISTORY:  Cholecystectomy 03/10/22, laparoscopy 03/14/22 TKA Lt 5/22 Rt 11/22, hypothyroidism, hyperlipidemia, OA  PAIN:  NPRS scale: 0/10 Pain location:back Pain description:  Aggravating factors:  Relieving factors:    NPRS scale: 4/10 Pain location: Rt anterior knee and patella Pain description: soreness Aggravating factors: first getting up in the morning, bending Relieving factors: moving  PRECAUTIONS: None  WEIGHT BEARING RESTRICTIONS No  FALLS:  Has patient fallen in last 6 months? No  LIVING ENVIRONMENT: Lives with: lives with their spouse Lives in: House/apartment Stairs: Yes: Internal: 13 steps; on left going up and External: 2 steps; can reach both Has  following equipment at home: Single point cane, Walker - 2 wheeled, Manufacturing engineer  OCCUPATION: retired, for leisure she enjoys hiking  PLOF: [place] return to activity  level prior to surgery without pain   OBJECTIVE:   PATIENT SURVEYS:  06/20/2022 FOTO 53% 05/29/2022 FOTO  44%, target 55%  COGNITION: 05/29/2022 Overall cognitive status: Within functional limits for tasks assessed    SENSATION: 05/29/2022 Not tested  POSTURE:  05/29/2022 flexed trunk  and weight shift left  PALPATION: 06/20/2022 - tenderness to palpation at distal IT band, no tenderness at joint line  LUMBAR ROM:   Active AROM  05/29/2022  Flexion   Extension   Right lateral flexion   Left lateral flexion   Right rotation   Left rotation    (Blank rows = not tested)  LOWER EXTREMITY ROM:     ROM (A: AROM, P: PROM) Right Eval 05/29/2022 Left Eval 05/29/2022 Right 06/20/2022  Hip flexion     Hip extension     Hip abduction     Hip adduction     Hip internal rotation     Hip external rotation     Knee flexion     Knee extension Seated LAQ A: -8* Seated LAQ WNL Seated  LAQ A: -2*  Ankle dorsiflexion     Ankle plantarflexion     Ankle inversion     Ankle eversion      (Blank rows = not tested)  LOWER EXTREMITY MMT:    MMT Right Eval 05/29/2022 Left Eval 05/29/2022 Right 06/20/22 Left  06/20/22  Hip flexion      Hip extension      Hip abduction      Hip adduction      Hip internal rotation      Hip external rotation      Knee flexion Seated  4/5 painful Seated  4/5 Seated HH Dynameter 35.8# & 36.7# RLE:LLE 86.7% Seated HH Dynameter 40.8# & 43.1#  Knee extension Seated  4/5 painful Seated  5/5 Seated HH Dynameter 52.9# & 54.1# RLE:LLE 99.7% Seated HH dynameter 55.9# & 52.4#  Ankle dorsiflexion      Ankle plantarflexion      Ankle inversion      Ankle eversion       (Blank rows = not tested)  FUNCTIONAL TESTS:  06/20/2022 Functional gait assessment:  21/30  05/29/2022 5 times sit to stand: 11.38 sec, no UE use  (cutoff for older adults is <12sec)  Functional gait assessment: 11/30  [facility] PT Assessment - 05/29/22 0001                Functional Gait  Assessment    Gait assessed  Yes     Gait Level Surface Walks 20 ft, slow speed, abnormal gait pattern, evidence for imbalance or deviates 10-15 in outside of the 12 in walkway width. Requires more than 7 sec to ambulate 20 ft.     Change in Gait Speed Makes only minor adjustments to walking speed, or accomplishes a change in speed with significant gait deviations, deviates 10-15 in outside the 12 in walkway width, or changes speed but loses balance but is able to recover and continue walking.     Gait with Horizontal Head Turns Performs head turns with moderate changes in gait velocity, slows down, deviates 10-15 in outside 12 in walkway width but recovers, can continue to walk.     Gait with Vertical Head Turns Performs task with moderate change in gait velocity, slows down, deviates 10-15 in outside 12 in walkway width but recovers, can continue to walk.     Gait and Pivot Turn Pivot turns safely in greater than  3 sec and stops with no loss of balance, or pivot turns safely within 3 sec and stops with mild imbalance, requires small steps to catch balance.     Step Over Obstacle Is able to step over one shoe box (4.5 in total height) but must slow down and adjust steps to clear box safely. May require verbal cueing.     Gait with Narrow Base of Support Ambulates less than 4 steps heel to toe or cannot perform without assistance.     Gait with Eyes Closed Walks 20 ft, slow speed, abnormal gait pattern, evidence for imbalance, deviates 10-15 in outside 12 in walkway width. Requires more than 9 sec to ambulate 20 ft.     Ambulating Backwards Walks 20 ft, uses assistive device, slower speed, mild gait deviations, deviates 6-10 in outside 12 in walkway width.     Steps Two feet to a stair, must use  rail.     Total Score 11        GAIT: 06/16/2022:  Independent ambulation into and through clinic between activity.  Less occurrences of lateral instability but still noted on occasion in stance phase SL for both LE.   05/29/2022 Distance walked: 200' Assistive device utilized: None Level of assistance: SBA Comments: shortened step length bil., flexed trunk, some M-L instability and near losses of balance  TODAY'S TREATMENT  06/29/2022: Therex: Nustep lvl 6 UE/LE 10 mins Leg press Double leg 75 lbs x 15, single leg 43 lbs x 15 bilateral Standing alt toe/heel lift x 20 Seated LAQ for mobility Rt leg x 20 Seated SLR x 15   Neuro Re-ed Tandem stance 1 min x 1 bilateral on foam - occasional hand touch correction on bar Eyes closed feet together on foam 30 sec x2 Feet together on foam c head turns/up/down  45 seconds  Feet hip width on foam church pew anterior/posterior weight shift 2 mins    06/22/2022 TherEx: -Standing 4 way hip motions with red theraband x 10 bil., increased fatigue with LLE stance, PT explained set up for home use with pt verbalized understanding -Standing IT band stretch 2 x 20s bil. -Seated SLR x 10 bil with additional cueing for technique  Neuro Re-ed: -Foam normal BOS vertical, horizontal, diagonal head turns x 8 ea -Foam normal BOS with A-P weight shifting x 2 min -Foam beam tandem walking 8' forward and backward x 5 ea., frequent UE support to maintain stability  TherAct: -66' and 30' walking with horizontal head turns, one instance of minA to maintain balance -32' walking with vertical head nods SBA -30' walking with eyes open and closed 3 steps ea. SBA -75' walking eyes open and closed 5 steps ea. SBA -60' with cues for 2 x 10' increased pace SBA  06/20/2022 Physical performance tests: FGA as noted in objective  TherAct: ROM, dynamometer testing, and FOTO assessment as indicated in objective PT demo & verbal cues stand to/from floor transfer  with chair and kneeling to pillow, pt returned demo x 1 PT explained verbal and visual - garden kneel bench to assist with transfers to gardening, pt verbalized understanding  PATIENT EDUCATION:  06/01/2022 Education details: HEP Person educated: Patient Education method: Explanation, cues, handout Education comprehension: verbalized understanding, demonstration  HOME EXERCISE PROGRAM: Access Code: [email or web address] URL: [email or web address] Date: 06/06/2022 Prepared by: Jamey Reas  Exercises - Sit to Stand  - 3 x daily - 7 x weekly - 1 sets - 10 reps - Seated Straight Leg Heel Taps  -  1-2 x daily - 7 x weekly - 1-2 sets - 10 reps - Tandem Stance  - 1-2 x daily - 7 x weekly - 1 sets - 1-2 reps - 30- 60 sec hold - [place] with Counter Support  - 1-2 x daily - 7 x weekly - 1 sets - 10 reps - Standing hip extension alternating legs  - 1-2 x daily - 7 x weekly - 1 sets - 10 reps - 5 seconds hold - Standing hip abduction alternating legs  - 1-2 x daily - 7 x weekly - 1 sets - 10 reps - 5 seconds hold - Standing March with Counter Support  - 1-2 x daily - 7 x weekly - 1 sets - 10 reps - 5 seconds hold  ASSESSMENT:  CLINICAL IMPRESSION: Rt knee "full, sore" feeling was present during visit today.  Focus on static positioning balance to reduce strain.  Reviewed some previous HEP for knee mobility to try to help in HEP for at home care.  Static balance control in various positioning showing improvements compared to previous.   OBJECTIVE IMPAIRMENTS Abnormal gait, decreased activity tolerance, decreased balance, decreased endurance, decreased mobility, difficulty walking, decreased ROM, decreased strength, postural dysfunction, and pain.   ACTIVITY LIMITATIONS carrying, lifting, bending, squatting, stairs, toileting, dressing, locomotion level, and caring for others  PARTICIPATION LIMITATIONS: meal prep, cleaning, laundry, driving, community activity, and yard work  [place], Time since onset of injury/illness/exacerbation, and 3+ comorbidities: see PMH  are also affecting patient's functional outcome.   REHAB POTENTIAL: Good  CLINICAL DECISION MAKING: Evolving/moderate complexity  EVALUATION COMPLEXITY: Low   GOALS: Goals reviewed with patient? Yes  SHORT TERM GOALS: Target date: 06/23/22  Patient will be independent with HEP to continue progressing outside of clinic. Baseline: see objective data Goal status: met 06/20/22  2.  Patient scores above a 18/30 on the FGA to represent reduced fall risk with gait activities. Baseline: see objective data Goal status: met 06/20/22   LONG TERM GOALS: Target date: 07/21/22  Patient will score FOTO >/= 55% to reflect improved self-perceived functional ability. Baseline: see objective data Goal status: on going - assessed 06/29/2022   2.  Patient scores above a 25/30 on the FGA to represent low fall risk with gait activities. Baseline: see objective data Goal status: on going - assessed 06/29/2022  3.  Patient reports </= 2/10 back and knee pain with standing, gait, and stair activity. Baseline: see objective data Goal status: on going - assessed 06/29/2022  4.  Patient Rt knee seated LAQ to -2* to represent improved knee range and quadriceps strength. Baseline: see objective data Goal status: met 06/20/22  5.  Patient safely ambulates 500' including ramp, curb, and stairs independently to allow for household and community mobility. Baseline: see objective data Goal status: on going - assessed 06/29/2022  6.  Patient verbalizes and demonstrates ability to perform ADLs, household chores, and yardwork. Baseline: see objective data Goal status: on going - assessed 06/29/2022   PLAN: PT FREQUENCY: 2x/week  PT DURATION: 8 weeks  PLANNED INTERVENTIONS: Therapeutic exercises, Therapeutic activity, Neuromuscular re-education, Balance training, Gait training, Patient/Family education, Self Care, Joint  mobilization, Stair training, Vestibular training, DME instructions, Dry Needling, Electrical stimulation, Spinal mobilization, Cryotherapy, Moist heat, Ultrasound, Ionotophoresis 74m/ml Dexamethasone, Manual therapy, Re-evaluation, and physical performance tests .  PLAN FOR NEXT SESSION: Recheck Rt knee symptoms/presentation.  Dynamic balance control, general strengthening.   MScot Jun PT, DPT, OCS, ATC 06/29/22  10:06 AM

## 2022-07-04 ENCOUNTER — Encounter: Payer: Medicare Other | Admitting: Physical Therapy

## 2022-07-04 ENCOUNTER — Encounter: Payer: Medicare Other | Admitting: Rehabilitative and Restorative Service Providers"

## 2022-07-06 ENCOUNTER — Encounter: Payer: Medicare Other | Admitting: Physical Therapy

## 2022-07-10 ENCOUNTER — Encounter: Payer: Self-pay | Admitting: Physical Therapy

## 2022-07-10 ENCOUNTER — Ambulatory Visit (INDEPENDENT_AMBULATORY_CARE_PROVIDER_SITE_OTHER): Payer: Medicare Other | Admitting: Physical Therapy

## 2022-07-10 DIAGNOSIS — M5459 Other low back pain: Secondary | ICD-10-CM | POA: Diagnosis not present

## 2022-07-10 DIAGNOSIS — R2681 Unsteadiness on feet: Secondary | ICD-10-CM

## 2022-07-10 DIAGNOSIS — M6281 Muscle weakness (generalized): Secondary | ICD-10-CM

## 2022-07-10 DIAGNOSIS — R2689 Other abnormalities of gait and mobility: Secondary | ICD-10-CM | POA: Diagnosis not present

## 2022-07-10 DIAGNOSIS — M25661 Stiffness of right knee, not elsewhere classified: Secondary | ICD-10-CM

## 2022-07-10 DIAGNOSIS — M25561 Pain in right knee: Secondary | ICD-10-CM

## 2022-07-10 NOTE — Therapy (Signed)
OUTPATIENT PHYSICAL THERAPY TREATMENT   Patient Name: Katie Weber MRN: [id number] DOB:1945/12/06, 76 y.o., female Today's Date: 07/10/2022  END OF SESSION:  PT End of Session - 07/10/22 1519     Visit Number 10    Number of Visits 16    Date for PT Re-Evaluation 07/21/22    Authorization Type Medicare Part A and B    Progress Note Due on Visit 63    PT Start Time 1515    PT Stop Time 1602    PT Time Calculation (min) 47 min    Activity Tolerance Patient tolerated treatment well    Behavior During Therapy [facility] for tasks assessed/performed                Past Medical History:  Diagnosis Date   Arthritis    arthritis fingers   Coronary artery calcification 04/24/2017   Diverticulitis    x2 -last 1'17 tx. outpatient.   Environmental and seasonal allergies    2014 Possibly related to air freshners.   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   Family history of anesthesia complication 32ZYY ago   daughter had an allergic reaction to atropine   GERD (gastroesophageal reflux disease)    History of hiatal hernia 09/14/2014   History of kidney stones    '80's lithotripsy-UVA x1   Hyperlipidemia    Hypothyroidism    Pneumonia 2014   Pure hypercholesterolemia    Tremor of left hand    Past Surgical History:  Procedure Laterality Date   BALLOON DILATION N/A 05/04/2014   Procedure: BALLOON DILATION;  Surgeon: Garlan Fair, MD;  Location: [facility];  Service: Endoscopy;  Laterality: N/A;   BREAST LUMPECTOMY Right 1972   benign    BREAST SURGERY Right    '72 -benign tumor   childbirth- NVD x2      CHOLECYSTECTOMY N/A 03/10/2022   Procedure: LAPAROSCOPIC CHOLECYSTECTOMY;  Surgeon: Jovita Kussmaul, MD;  Location: [facility];  Service: General;  Laterality: N/A;   COLONOSCOPY WITH PROPOFOL N/A 05/04/2014   Procedure: COLONOSCOPY WITH PROPOFOL;  Surgeon: Garlan Fair, MD;  Location: [facility];   Service: Endoscopy;  Laterality: N/A;   DILATION AND CURETTAGE OF UTERUS     04-20-14 [facility] hospital endometrial polyp removed.   ESOPHAGOGASTRODUODENOSCOPY (EGD) WITH PROPOFOL N/A 05/04/2014   Procedure: ESOPHAGOGASTRODUODENOSCOPY (EGD) WITH PROPOFOL;  Surgeon: Garlan Fair, MD;  Location: [facility];  Service: Endoscopy;  Laterality: N/A;. Procedure done in [place], [place]- 5 yrs ago.   HYSTEROSCOPY WITH D & C N/A 04/20/2014   Procedure: DILATATION AND CURETTAGE /HYSTEROSCOPY;  Surgeon: [name], MD;  Location: [facility];  Service: Gynecology;  Laterality: N/A;   IR PERC CHOLECYSTOSTOMY  01/08/2022   IR RADIOLOGIST EVAL & MGMT  02/10/2022   LAPAROSCOPY N/A 03/14/2022   Procedure: LAPAROSCOPY DIAGNOSTIC  WITH IRRIGATION AND  DRAINAGE OF Lucia Bitter;  Surgeon: Jovita Kussmaul, MD;  Location: [facility];  Service: General;  Laterality: N/A;   LITHOTRIPSY     POLYPECTOMY  2020   Dr. Lear Ng   TOTAL KNEE ARTHROPLASTY Left 02/15/2021   Procedure: LEFT TOTAL KNEE ARTHROPLASTY;  Surgeon: Mcarthur Rossetti, MD;  Location: [facility];  Service: Orthopedics;  Laterality: Left;   TOTAL KNEE ARTHROPLASTY Right 08/09/2021   Procedure: RIGHT TOTAL KNEE ARTHROPLASTY;  Surgeon: Mcarthur Rossetti, MD;  Location: [facility];  Service: Orthopedics;  Laterality: Right;  Needs RNFA   TUBAL LIGATION     Patient Active Problem List   Diagnosis Date Noted   Hypovolemia due to dehydration 03/15/2022   AKI (acute kidney injury) ([place]) 03/15/2022   Biloma following surgery 03/14/2022   Abdominal pain 03/13/2022   Postoperative pain 03/13/2022   Acute cholecystitis 82/95/6213   Biliary colic 08/65/7846   New onset atrial flutter ([place]) 01/07/2022   DJD (degenerative joint disease) of knee 08/09/2021   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   Unilateral primary osteoarthritis, right knee 06/06/2021   Status post total left knee replacement 02/15/2021   Unilateral primary osteoarthritis, left knee  01/04/2021   Coronary artery calcification 04/24/2017   Hyperlipidemia 04/24/2017   Acute bronchitis 09/29/2014   SBO (small bowel obstruction) ([place]) 09/27/2014   Nausea and vomiting 09/27/2014   Hypothyroidism 09/27/2014   Leukocytosis 09/27/2014    PCP: Leeroy Cha, MD  REFERRING PROVIDER: Jovita Kussmaul, MD  REFERRING DIAG: T81.89XA,K66.8 (ICD-10-CM) - Biloma following surgery, initial encounter G89.18 (ICD-10-CM) - Postoperative pain  Rationale for Evaluation and Treatment Rehabilitation  THERAPY DIAG:  Unsteadiness on feet  Other abnormalities of gait and mobility  Muscle weakness (generalized)  Other low back pain  Acute pain of right knee  Stiffness of right knee, not elsewhere classified  ONSET DATE: 03/17/22 Referral  SUBJECTIVE:                                                                                                                                                                                           SUBJECTIVE STATEMENT: Her knee pain & back pain is limiting her standing ADLs.  She is now 5'1" & was '5\' 3"'  1.5-2 years ago.    PERTINENT HISTORY:  Cholecystectomy 03/10/22, laparoscopy 03/14/22 TKA Lt 5/22 Rt 11/22, hypothyroidism, hyperlipidemia, OA  PAIN:  NPRS scale: 7-8/10 since last PT lowest 2/10 & highest 10/10 Pain location:back upper lumbar & lower thoracic Pain description: dull achy Aggravating factors: standing Relieving factors:  walking & flexing forward  NPRS scale:  0/10 used Voltarin prior to PT,  7-8/10 prior to Voltarin Pain location: Rt anterior-lateral knee and patella Pain description: soreness Aggravating factors: first getting up in the morning or initially moving after knee stiffening, bending Relieving factors: moving  PRECAUTIONS: None  WEIGHT BEARING RESTRICTIONS No  FALLS:  Has patient fallen in last 6 months? No  LIVING ENVIRONMENT: Lives with: lives with their spouse Lives in:  House/apartment Stairs: Yes: Internal: 13 steps; on left going up and External: 2 steps; can reach both Has following equipment at home: Single point cane, Walker - 2 wheeled, Manufacturing engineer  OCCUPATION: retired, for leisure she enjoys hiking  PLOF: Independent  PATIENT GOALS return to activity level prior to surgery without pain   OBJECTIVE:   PATIENT SURVEYS:  06/20/2022 FOTO 53% 05/29/2022 FOTO  44%, target 55%  COGNITION: 05/29/2022 Overall cognitive status: Within functional limits for tasks assessed    SENSATION: 05/29/2022 Not tested  POSTURE:  05/29/2022 flexed trunk  and weight shift left  PALPATION: 06/20/2022 - tenderness to palpation at distal IT band, no tenderness at joint line  LUMBAR ROM:   Active AROM  05/29/2022  Flexion   Extension   Right lateral flexion   Left lateral flexion   Right rotation   Left rotation    (Blank rows = not tested)  LOWER EXTREMITY ROM:     ROM (A: AROM, P: PROM) Right Eval 05/29/2022 Left Eval 05/29/2022 Right 06/20/2022  Hip flexion     Hip extension     Hip abduction     Hip adduction     Hip internal rotation     Hip external rotation     Knee flexion     Knee extension Seated LAQ A: -8* Seated LAQ WNL Seated  LAQ A: -2*  Ankle dorsiflexion     Ankle plantarflexion     Ankle inversion     Ankle eversion      (Blank rows = not tested)  LOWER EXTREMITY MMT:    MMT Right Eval 05/29/2022 Left Eval 05/29/2022 Right 06/20/22 Left  06/20/22  Hip flexion      Hip extension      Hip abduction      Hip adduction      Hip internal rotation      Hip external rotation      Knee flexion Seated  4/5 painful Seated  4/5 Seated HH Dynameter 35.8# & 36.7# RLE:LLE 86.7% Seated HH Dynameter 40.8# & 43.1#  Knee extension Seated  4/5 painful Seated  5/5 Seated HH Dynameter 52.9# & 54.1# RLE:LLE 99.7% Seated HH dynameter 55.9# & 52.4#  Ankle dorsiflexion      Ankle plantarflexion      Ankle inversion       Ankle eversion       (Blank rows = not tested)  FUNCTIONAL TESTS:  06/20/2022 Functional gait assessment: 21/30  05/29/2022 5 times sit to stand: 11.38 sec, no UE use  (cutoff for older adults is <12sec)  Functional gait assessment: 11/30  [facility] PT Assessment - 05/29/22 0001                Functional Gait  Assessment    Gait assessed  Yes     Gait Level Surface Walks 20 ft, slow speed, abnormal gait pattern, evidence for imbalance or deviates 10-15 in outside of the 12 in walkway width. Requires more than 7 sec to ambulate 20 ft.     Change in Gait Speed Makes only minor adjustments to walking speed, or accomplishes a change in speed with significant gait deviations, deviates 10-15 in outside the 12 in walkway width, or changes speed but loses balance but is able to recover and continue walking.     Gait with Horizontal Head Turns Performs head turns with moderate changes in gait velocity, slows down, deviates 10-15 in outside 12 in walkway width but recovers, can continue to walk.     Gait with Vertical Head Turns Performs task with moderate change in gait velocity, slows down, deviates 10-15 in outside 12 in walkway width but recovers, can continue  to walk.     Gait and Pivot Turn Pivot turns safely in greater than 3 sec and stops with no loss of balance, or pivot turns safely within 3 sec and stops with mild imbalance, requires small steps to catch balance.     Step Over Obstacle Is able to step over one shoe box (4.5 in total height) but must slow down and adjust steps to clear box safely. May require verbal cueing.     Gait with Narrow Base of Support Ambulates less than 4 steps heel to toe or cannot perform without assistance.     Gait with Eyes Closed Walks 20 ft, slow speed, abnormal gait pattern, evidence for imbalance, deviates 10-15 in outside 12 in walkway width. Requires more than 9 sec to ambulate 20 ft.     Ambulating Backwards Walks 20 ft, uses assistive device, slower speed,  mild gait deviations, deviates 6-10 in outside 12 in walkway width.     Steps Two feet to a stair, must use rail.     Total Score 11        GAIT: 06/16/2022:  Independent ambulation into and through clinic between activity.  Less occurrences of lateral instability but still noted on occasion in stance phase SL for both LE.   05/29/2022 Distance walked: 200' Assistive device utilized: None Level of assistance: SBA Comments: shortened step length bil., flexed trunk, some M-L instability and near losses of balance  TODAY'S TREATMENT  07/10/2022 Self-care: PT demo & verbal cues on sit to/from stand technique.  Pt return demo understanding & reports more comfortable on her back & knee.  PT demo & verbal cues on supine to/from sit via sidelying. Pt return demo understanding & reports no back strain exp compared to how she was doing it.  PT demo & verbal cues on using pillows to position & "tent" sheets in sidelying. Pt verbalized understanding & reports less back & knee strain.   Therapeutic exercise: -single knee to chest stretch 15 sec hold 3 reps ea LE -double knee to chest stretch (bringing one knee up & down at time) 15 sec 3 reps.  -trunk rotation stretch supine 15 sec hold 3 reps ea direction -seated lumbar lordosis arching & flexion 5 sec hold 10 reps ea -stop standing theraband exercises for now.  -continue standing ITB stretch Pt & husband verbalized understanding.   06/29/2022: Therex: Nustep lvl 6 UE/LE 10 mins Leg press Double leg 75 lbs x 15, single leg 43 lbs x 15 bilateral Standing alt toe/heel lift x 20 Seated LAQ for mobility Rt leg x 20 Seated SLR x 15   Neuro Re-ed Tandem stance 1 min x 1 bilateral on foam - occasional hand touch correction on bar Eyes closed feet together on foam 30 sec x2 Feet together on foam c head turns/up/down  45 seconds  Feet hip width on foam church pew anterior/posterior weight shift 2 mins    06/22/2022 TherEx: -Standing 4 way hip  motions with red theraband x 10 bil., increased fatigue with LLE stance, PT explained set up for home use with pt verbalized understanding -Standing IT band stretch 2 x 20s bil. -Seated SLR x 10 bil with additional cueing for technique  Neuro Re-ed: -Foam normal BOS vertical, horizontal, diagonal head turns x 8 ea -Foam normal BOS with A-P weight shifting x 2 min -Foam beam tandem walking 8' forward and backward x 5 ea., frequent UE support to maintain stability  TherAct: -65' and 30' walking with horizontal  head turns, one instance of minA to maintain balance -75' walking with vertical head nods SBA -30' walking with eyes open and closed 3 steps ea. SBA -75' walking eyes open and closed 5 steps ea. SBA -86' with cues for 2 x 10' increased pace SBA   PATIENT EDUCATION:  06/01/2022 Education details: HEP Person educated: Patient Education method: Explanation, cues, handout Education comprehension: verbalized understanding, demonstration  HOME EXERCISE PROGRAM: Access Code: [email or web address] URL: [email or web address] Date: 06/06/2022 Prepared by: Jamey Reas  Exercises - Sit to Stand  - 3 x daily - 7 x weekly - 1 sets - 10 reps - Seated Straight Leg Heel Taps  - 1-2 x daily - 7 x weekly - 1-2 sets - 10 reps - Tandem Stance  - 1-2 x daily - 7 x weekly - 1 sets - 1-2 reps - 30- 60 sec hold - [place] with Counter Support  - 1-2 x daily - 7 x weekly - 1 sets - 10 reps - Standing hip extension alternating legs  - 1-2 x daily - 7 x weekly - 1 sets - 10 reps - 5 seconds hold - Standing hip abduction alternating legs  - 1-2 x daily - 7 x weekly - 1 sets - 10 reps - 5 seconds hold - Standing March with Counter Support  - 1-2 x daily - 7 x weekly - 1 sets - 10 reps - 5 seconds hold  ASSESSMENT:  CLINICAL IMPRESSION: Patient's knee & back pain are limiting her mobility significantly.  PT educated on body mechanics with sit/stand & supine/sit which reduced her back pain in moment  and will monitor for further improvement.  She appears to understand back exercises that PT instructed today.  She will probably need recertification next week as progress slowed by pain issues.   OBJECTIVE IMPAIRMENTS Abnormal gait, decreased activity tolerance, decreased balance, decreased endurance, decreased mobility, difficulty walking, decreased ROM, decreased strength, postural dysfunction, and pain.   ACTIVITY LIMITATIONS carrying, lifting, bending, squatting, stairs, toileting, dressing, locomotion level, and caring for others  PARTICIPATION LIMITATIONS: meal prep, cleaning, laundry, driving, community activity, and yard work  [place], Time since onset of injury/illness/exacerbation, and 3+ comorbidities: see PMH  are also affecting patient's functional outcome.   REHAB POTENTIAL: Good  CLINICAL DECISION MAKING: Evolving/moderate complexity  EVALUATION COMPLEXITY: Low   GOALS: Goals reviewed with patient? Yes  SHORT TERM GOALS: Target date: 06/23/22  Patient will be independent with HEP to continue progressing outside of clinic. Baseline: see objective data Goal status: met 06/20/22  2.  Patient scores above a 18/30 on the FGA to represent reduced fall risk with gait activities. Baseline: see objective data Goal status: met 06/20/22   LONG TERM GOALS: Target date: 07/21/22  Patient will score FOTO >/= 55% to reflect improved self-perceived functional ability. Baseline: see objective data Goal status: on going - assessed 06/29/2022   2.  Patient scores above a 25/30 on the FGA to represent low fall risk with gait activities. Baseline: see objective data Goal status: on going - assessed 06/29/2022  3.  Patient reports </= 2/10 back and knee pain with standing, gait, and stair activity. Baseline: see objective data Goal status: on going - assessed 06/29/2022  4.  Patient Rt knee seated LAQ to -2* to represent improved knee range and quadriceps  strength. Baseline: see objective data Goal status: met 06/20/22  5.  Patient safely ambulates 500' including ramp, curb, and stairs independently to allow for household and community mobility.  Baseline: see objective data Goal status: on going - assessed 06/29/2022  6.  Patient verbalizes and demonstrates ability to perform ADLs, household chores, and yardwork. Baseline: see objective data Goal status: on going - assessed 06/29/2022   PLAN: PT FREQUENCY: 2x/week  PT DURATION: 8 weeks  PLANNED INTERVENTIONS: Therapeutic exercises, Therapeutic activity, Neuromuscular re-education, Balance training, Gait training, Patient/Family education, Self Care, Joint mobilization, Stair training, Vestibular training, DME instructions, Dry Needling, Electrical stimulation, Spinal mobilization, Cryotherapy, Moist heat, Ultrasound, Ionotophoresis 16m/ml Dexamethasone, Manual therapy, Re-evaluation, and physical performance tests .  PLAN FOR NEXT SESSION:   check if body mechanic & exercises instructed today improved her back &/or knee pain,  work on standing balance,    RJamey Reas PT, DPT 07/10/2022, 4:17 PM

## 2022-07-11 ENCOUNTER — Encounter: Payer: Medicare Other | Admitting: Rehabilitative and Restorative Service Providers"

## 2022-07-13 ENCOUNTER — Encounter: Payer: Self-pay | Admitting: Physical Therapy

## 2022-07-13 ENCOUNTER — Encounter: Payer: Medicare Other | Admitting: Rehabilitative and Restorative Service Providers"

## 2022-07-13 ENCOUNTER — Ambulatory Visit (INDEPENDENT_AMBULATORY_CARE_PROVIDER_SITE_OTHER): Payer: Medicare Other | Admitting: Physical Therapy

## 2022-07-13 DIAGNOSIS — M25561 Pain in right knee: Secondary | ICD-10-CM

## 2022-07-13 DIAGNOSIS — M5459 Other low back pain: Secondary | ICD-10-CM

## 2022-07-13 DIAGNOSIS — R2681 Unsteadiness on feet: Secondary | ICD-10-CM | POA: Diagnosis not present

## 2022-07-13 DIAGNOSIS — M25661 Stiffness of right knee, not elsewhere classified: Secondary | ICD-10-CM

## 2022-07-13 DIAGNOSIS — R2689 Other abnormalities of gait and mobility: Secondary | ICD-10-CM

## 2022-07-13 DIAGNOSIS — M6281 Muscle weakness (generalized): Secondary | ICD-10-CM | POA: Diagnosis not present

## 2022-07-13 NOTE — Therapy (Signed)
OUTPATIENT PHYSICAL THERAPY TREATMENT   Patient Name: Katie Weber MRN: [id number] DOB:[birth year], 76 y.o., female Today's Date: 07/13/2022  END OF SESSION:  PT End of Session - 07/13/22 1018     Visit Number 11    Number of Visits 16    Date for PT Re-Evaluation 07/21/22    Authorization Type Medicare Part A and B    Progress Note Due on Visit 17    PT Start Time 6568    PT Stop Time 1101    PT Time Calculation (min) 46 min    Activity Tolerance Patient tolerated treatment well    Behavior During Therapy [facility] for tasks assessed/performed                 Past Medical History:  Diagnosis Date   Arthritis    arthritis fingers   Coronary artery calcification 04/24/2017   Diverticulitis    x2 -last 1'17 tx. outpatient.   Environmental and seasonal allergies    2014 Possibly related to air freshners.   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   Family history of anesthesia complication 12XNT ago   daughter had an allergic reaction to atropine   GERD (gastroesophageal reflux disease)    History of hiatal hernia 09/14/2014   History of kidney stones    '80's lithotripsy-[facility] x1   Hyperlipidemia    Hypothyroidism    Pneumonia 2014   Pure hypercholesterolemia    Tremor of left hand    Past Surgical History:  Procedure Laterality Date   BALLOON DILATION N/A 05/04/2014   Procedure: BALLOON DILATION;  Surgeon: Garlan Fair, MD;  Location: [facility];  Service: Endoscopy;  Laterality: N/A;   BREAST LUMPECTOMY Right 1972   benign    BREAST SURGERY Right    '72 -benign tumor   childbirth- NVD x2      CHOLECYSTECTOMY N/A 03/10/2022   Procedure: LAPAROSCOPIC CHOLECYSTECTOMY;  Surgeon: Jovita Kussmaul, MD;  Location: [place];  Service: General;  Laterality: N/A;   COLONOSCOPY WITH PROPOFOL N/A 05/04/2014   Procedure: COLONOSCOPY WITH PROPOFOL;  Surgeon: Garlan Fair, MD;  Location: [facility];   Service: Endoscopy;  Laterality: N/A;   DILATION AND CURETTAGE OF UTERUS     04-20-14 [facility] endometrial polyp removed.   ESOPHAGOGASTRODUODENOSCOPY (EGD) WITH PROPOFOL N/A 05/04/2014   Procedure: ESOPHAGOGASTRODUODENOSCOPY (EGD) WITH PROPOFOL;  Surgeon: Garlan Fair, MD;  Location: [facility];  Service: Endoscopy;  Laterality: N/A;. Procedure done in [place], [place]- 5 yrs ago.   HYSTEROSCOPY WITH D & C N/A 04/20/2014   Procedure: DILATATION AND CURETTAGE /HYSTEROSCOPY;  Surgeon: [name], MD;  Location: [facility];  Service: Gynecology;  Laterality: N/A;   IR PERC CHOLECYSTOSTOMY  01/08/2022   IR RADIOLOGIST EVAL & MGMT  02/10/2022   LAPAROSCOPY N/A 03/14/2022   Procedure: LAPAROSCOPY DIAGNOSTIC  WITH IRRIGATION AND  DRAINAGE OF Lucia Bitter;  Surgeon: Jovita Kussmaul, MD;  Location: [place];  Service: General;  Laterality: N/A;   LITHOTRIPSY     POLYPECTOMY  2020   Dr. Lear Ng   TOTAL KNEE ARTHROPLASTY Left 02/15/2021   Procedure: LEFT TOTAL KNEE ARTHROPLASTY;  Surgeon: Mcarthur Rossetti, MD;  Location: [place];  Service: Orthopedics;  Laterality: Left;   TOTAL KNEE ARTHROPLASTY Right 08/09/2021   Procedure: RIGHT TOTAL KNEE ARTHROPLASTY;  Surgeon: Mcarthur Rossetti, MD;  Location: [place];  Service: Orthopedics;  Laterality: Right;  Needs RNFA   TUBAL LIGATION     Patient Active Problem List   Diagnosis Date Noted   Hypovolemia due to dehydration 03/15/2022   AKI (acute kidney injury) ([place]) 03/15/2022   Biloma following surgery 03/14/2022   Abdominal pain 03/13/2022   Postoperative pain 03/13/2022   Acute cholecystitis 54/65/6812   Biliary colic 75/17/0017   New onset atrial flutter ([place]) 01/07/2022   DJD (degenerative joint disease) of knee 08/09/2021   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   Unilateral primary osteoarthritis, right knee 06/06/2021   Status post total left knee replacement 02/15/2021   Unilateral primary osteoarthritis, left knee  01/04/2021   Coronary artery calcification 04/24/2017   Hyperlipidemia 04/24/2017   Acute bronchitis 09/29/2014   SBO (small bowel obstruction) ([place]) 09/27/2014   Nausea and vomiting 09/27/2014   Hypothyroidism 09/27/2014   Leukocytosis 09/27/2014    PCP: Leeroy Cha, MD  REFERRING PROVIDER: Jovita Kussmaul, MD  REFERRING DIAG: T81.89XA,K66.8 (ICD-10-CM) - Biloma following surgery, initial encounter G89.18 (ICD-10-CM) - Postoperative pain  Rationale for Evaluation and Treatment Rehabilitation  THERAPY DIAG:  Unsteadiness on feet  Other abnormalities of gait and mobility  Muscle weakness (generalized)  Other low back pain  Acute pain of right knee  Stiffness of right knee, not elsewhere classified  ONSET DATE: 03/17/22 Referral  SUBJECTIVE:                                                                                                                                                                                           SUBJECTIVE STATEMENT: She has been working on sit/stand and getting in/out of bed with technique PT instructed last session. She is also doing back exercises /stretches. This is helping her pain to point that she does not feel need for medication.    PERTINENT HISTORY:  Cholecystectomy 03/10/22, laparoscopy 03/14/22 TKA Lt 5/22 Rt 11/22, hypothyroidism, hyperlipidemia, OA  PAIN:  NPRS scale: 7-8/10 since last PT lowest 2/10 & highest 10/10 Pain location:back upper lumbar & lower thoracic Pain description: dull achy Aggravating factors: standing Relieving factors:  walking & flexing forward  NPRS scale:  0/10 used Voltarin prior to PT,  7-8/10 prior to Voltarin Pain location: Rt anterior-lateral knee and patella Pain description: soreness Aggravating factors: first getting up in the morning or initially moving after knee stiffening, bending Relieving factors: moving  PRECAUTIONS: None  WEIGHT BEARING RESTRICTIONS No  FALLS:  Has  patient fallen in last 6 months? No  LIVING ENVIRONMENT: Lives with: lives with their spouse Lives in: House/apartment Stairs: Yes: Internal: 13 steps; on left going up and External: 2 steps; can reach  both Has following equipment at home: Single point cane, Walker - 2 wheeled, Manufacturing engineer  OCCUPATION: retired, for leisure she enjoys hiking  PLOF: [place] return to activity level prior to surgery without pain   OBJECTIVE:   PATIENT SURVEYS:  06/20/2022 FOTO 53% 05/29/2022 FOTO  44%, target 55%  COGNITION: 05/29/2022 Overall cognitive status: Within functional limits for tasks assessed    SENSATION: 05/29/2022 Not tested  POSTURE:  05/29/2022 flexed trunk  and weight shift left  PALPATION: 06/20/2022 - tenderness to palpation at distal IT band, no tenderness at joint line  LUMBAR ROM:   Active AROM  05/29/2022  Flexion   Extension   Right lateral flexion   Left lateral flexion   Right rotation   Left rotation    (Blank rows = not tested)  LOWER EXTREMITY ROM:     ROM (A: AROM, P: PROM) Right Eval 05/29/2022 Left Eval 05/29/2022 Right 06/20/2022  Hip flexion     Hip extension     Hip abduction     Hip adduction     Hip internal rotation     Hip external rotation     Knee flexion     Knee extension Seated LAQ A: -8* Seated LAQ WNL Seated  LAQ A: -2*  Ankle dorsiflexion     Ankle plantarflexion     Ankle inversion     Ankle eversion      (Blank rows = not tested)  LOWER EXTREMITY MMT:    MMT Right Eval 05/29/2022 Left Eval 05/29/2022 Right 06/20/22 Left  06/20/22  Hip flexion      Hip extension      Hip abduction      Hip adduction      Hip internal rotation      Hip external rotation      Knee flexion Seated  4/5 painful Seated  4/5 Seated HH Dynameter 35.8# & 36.7# RLE:LLE 86.7% Seated HH Dynameter 40.8# & 43.1#  Knee extension Seated  4/5 painful Seated  5/5 Seated HH Dynameter 52.9# & 54.1# RLE:LLE 99.7%  Seated HH dynameter 55.9# & 52.4#  Ankle dorsiflexion      Ankle plantarflexion      Ankle inversion      Ankle eversion       (Blank rows = not tested)  FUNCTIONAL TESTS:  06/20/2022 Functional gait assessment: 21/30  05/29/2022 5 times sit to stand: 11.38 sec, no UE use  (cutoff for older adults is <12sec)  Functional gait assessment: 11/30  [facility] PT Assessment - 05/29/22 0001                Functional Gait  Assessment    Gait assessed  Yes     Gait Level Surface Walks 20 ft, slow speed, abnormal gait pattern, evidence for imbalance or deviates 10-15 in outside of the 12 in walkway width. Requires more than 7 sec to ambulate 20 ft.     Change in Gait Speed Makes only minor adjustments to walking speed, or accomplishes a change in speed with significant gait deviations, deviates 10-15 in outside the 12 in walkway width, or changes speed but loses balance but is able to recover and continue walking.     Gait with Horizontal Head Turns Performs head turns with moderate changes in gait velocity, slows down, deviates 10-15 in outside 12 in walkway width but recovers, can continue to walk.     Gait with Vertical Head Turns Performs task with moderate change  in gait velocity, slows down, deviates 10-15 in outside 12 in walkway width but recovers, can continue to walk.     Gait and Pivot Turn Pivot turns safely in greater than 3 sec and stops with no loss of balance, or pivot turns safely within 3 sec and stops with mild imbalance, requires small steps to catch balance.     Step Over Obstacle Is able to step over one shoe box (4.5 in total height) but must slow down and adjust steps to clear box safely. May require verbal cueing.     Gait with Narrow Base of Support Ambulates less than 4 steps heel to toe or cannot perform without assistance.     Gait with Eyes Closed Walks 20 ft, slow speed, abnormal gait pattern, evidence for imbalance, deviates 10-15 in outside 12 in walkway width. Requires  more than 9 sec to ambulate 20 ft.     Ambulating Backwards Walks 20 ft, uses assistive device, slower speed, mild gait deviations, deviates 6-10 in outside 12 in walkway width.     Steps Two feet to a stair, must use rail.     Total Score 11        GAIT: 06/16/2022:  Independent ambulation into and through clinic between activity.  Less occurrences of lateral instability but still noted on occasion in stance phase SL for both LE.   05/29/2022 Distance walked: 200' Assistive device utilized: None Level of assistance: SBA Comments: shortened step length bil., flexed trunk, some M-L instability and near losses of balance  TODAY'S TREATMENT  07/13/2022 Therapeutic Activities: Pt demo proper form with sit to/from stand and sit to/from supine via sidelying going both ways.   Pt reported issues with getting in/out of high bed.  Her bed comes to her buttocks when standing in front. PT set mat table to similar height.  PT demo & verbal cues on lifting one hip with rotation onto bed then scoot back on bed. Getting out with positioning rotation & sliding single leg to floor.  Pt return demo & verbalized understanding getting in & out of high bed. She reported less stress on her back.   Therapeutic Exercise: Pt reviewed & updated HEP as noted below with tactile, verbal & HO. Pt verbalized & return demo understanding. Pt reported added single & double hamstring stretches on her own because felt "good" to her back.  PT instructed in form and included in HEP.  Access Code: [email or web address] URL: [email or web address] Date: 07/13/2022 Prepared by: Jamey Reas  Exercises - Hooklying Single Knee to Chest Stretch  - 2-4 x daily - 7 x weekly - 1 sets - 3 reps - 15 seconds hold - Supine Double Knee to Chest Modified  - 2-4 x daily - 7 x weekly - 1 sets - 3 reps - 15 seconds hold - Supine Hamstring Stretch  - 2-4 x daily - 7 x weekly - 1 sets - 3 reps - 15 seconds hold - Supine Lower Trunk Rotation   - 2-4 x daily - 7 x weekly - 1 sets - 3 reps - 15 seconds hold - Seated Pelvic Tilt  - 2-4 x daily - 7 x weekly - 1-3 sets - 10 reps - 5 seconds hold  -single knee to chest stretch 15 sec or 3-5 deep breathes for hold 3 reps ea LE  -hamstring stretch - towel under thigh may be easier - knee to chest then push heel towards ceiling. 15 sec or 3-5 deep breathes for hold 3  reps ea LE  -double knee to chest stretch (bringing one knee up & down at time) 15 sec or 3-5 deep breathes for hold 3 reps.   -double hamstring - bring knees to chest one at time, push one heel towards ceiling for single leg stretch return to both knees to chest, repeat with other leg;  then push one leg at time toward ceiling until both legs are up, lower feet bringing knees to chest one at a time,  then lower both legs to bed one at a time.  15 sec or 3-5 deep breathes for hold 3 reps ea LE  -trunk rotation stretch supine 15 sec or 3-5 deep breathes for hold 3 reps ea direction  -seated lumbar lordosis arching & flexion flattening back 5 sec hold 10 reps ea.  Feet on floor with hands on thighs.  Breath with hold both directions.   07/10/2022 Self-care: PT demo & verbal cues on sit to/from stand technique.  Pt return demo understanding & reports more comfortable on her back & knee.  PT demo & verbal cues on supine to/from sit via sidelying. Pt return demo understanding & reports no back strain exp compared to how she was doing it.  PT demo & verbal cues on using pillows to position & "tent" sheets in sidelying. Pt verbalized understanding & reports less back & knee strain.   Therapeutic exercise: -single knee to chest stretch 15 sec hold 3 reps ea LE -double knee to chest stretch (bringing one knee up & down at time) 15 sec 3 reps.  -trunk rotation stretch supine 15 sec hold 3 reps ea direction -seated lumbar lordosis arching & flexion 5 sec hold 10 reps ea -stop standing theraband exercises for now.  -continue standing ITB  stretch Pt & husband verbalized understanding.   06/29/2022: Therex: Nustep lvl 6 UE/LE 10 mins Leg press Double leg 75 lbs x 15, single leg 43 lbs x 15 bilateral Standing alt toe/heel lift x 20 Seated LAQ for mobility Rt leg x 20 Seated SLR x 15   Neuro Re-ed Tandem stance 1 min x 1 bilateral on foam - occasional hand touch correction on bar Eyes closed feet together on foam 30 sec x2 Feet together on foam c head turns/up/down  45 seconds  Feet hip width on foam church pew anterior/posterior weight shift 2 mins    06/22/2022 TherEx: -Standing 4 way hip motions with red theraband x 10 bil., increased fatigue with LLE stance, PT explained set up for home use with pt verbalized understanding -Standing IT band stretch 2 x 20s bil. -Seated SLR x 10 bil with additional cueing for technique  Neuro Re-ed: -Foam normal BOS vertical, horizontal, diagonal head turns x 8 ea -Foam normal BOS with A-P weight shifting x 2 min -Foam beam tandem walking 8' forward and backward x 5 ea., frequent UE support to maintain stability  TherAct: -81' and 30' walking with horizontal head turns, one instance of minA to maintain balance -46' walking with vertical head nods SBA -30' walking with eyes open and closed 3 steps ea. SBA -75' walking eyes open and closed 5 steps ea. SBA -15' with cues for 2 x 10' increased pace SBA   PATIENT EDUCATION:  06/01/2022 Education details: HEP Person educated: Patient Education method: Explanation, cues, handout Education comprehension: verbalized understanding, demonstration  HOME EXERCISE PROGRAM: Access Code: [email or web address] URL: [email or web address] Date: 06/06/2022 Prepared by: Jamey Reas  Exercises - Sit to Stand  - 3 x daily - 7  x weekly - 1 sets - 10 reps - Seated Straight Leg Heel Taps  - 1-2 x daily - 7 x weekly - 1-2 sets - 10 reps - Tandem Stance  - 1-2 x daily - 7 x weekly - 1 sets - 1-2 reps - 30- 60 sec hold - [place] with Counter  Support  - 1-2 x daily - 7 x weekly - 1 sets - 10 reps - Standing hip extension alternating legs  - 1-2 x daily - 7 x weekly - 1 sets - 10 reps - 5 seconds hold - Standing hip abduction alternating legs  - 1-2 x daily - 7 x weekly - 1 sets - 10 reps - 5 seconds hold - Standing March with Counter Support  - 1-2 x daily - 7 x weekly - 1 sets - 10 reps - 5 seconds hold  ASSESSMENT:  CLINICAL IMPRESSION: Patient reports back is improved with PT instructions in sit/stand, getting in/out of high bed & sit/supine.  She appears to understand HEP for back.  PT has spent last few sessions addressing back pain to enable her to function and work on her balance.    OBJECTIVE IMPAIRMENTS Abnormal gait, decreased activity tolerance, decreased balance, decreased endurance, decreased mobility, difficulty walking, decreased ROM, decreased strength, postural dysfunction, and pain.   ACTIVITY LIMITATIONS carrying, lifting, bending, squatting, stairs, toileting, dressing, locomotion level, and caring for others  PARTICIPATION LIMITATIONS: meal prep, cleaning, laundry, driving, community activity, and yard work  [place], Time since onset of injury/illness/exacerbation, and 3+ comorbidities: see PMH  are also affecting patient's functional outcome.   REHAB POTENTIAL: Good  CLINICAL DECISION MAKING: Evolving/moderate complexity  EVALUATION COMPLEXITY: Low   GOALS: Goals reviewed with patient? Yes  SHORT TERM GOALS: Target date: 06/23/22  Patient will be independent with HEP to continue progressing outside of clinic. Baseline: see objective data Goal status: met 06/20/22  2.  Patient scores above a 18/30 on the FGA to represent reduced fall risk with gait activities. Baseline: see objective data Goal status: met 06/20/22   LONG TERM GOALS: Target date: 07/21/22  Patient will score FOTO >/= 55% to reflect improved self-perceived functional ability. Baseline: see objective data Goal  status: on going - assessed 06/29/2022   2.  Patient scores above a 25/30 on the FGA to represent low fall risk with gait activities. Baseline: see objective data Goal status: on going - assessed 06/29/2022  3.  Patient reports </= 2/10 back and knee pain with standing, gait, and stair activity. Baseline: see objective data Goal status: on going - assessed 06/29/2022  4.  Patient Rt knee seated LAQ to -2* to represent improved knee range and quadriceps strength. Baseline: see objective data Goal status: met 06/20/22  5.  Patient safely ambulates 500' including ramp, curb, and stairs independently to allow for household and community mobility. Baseline: see objective data Goal status: on going - assessed 06/29/2022  6.  Patient verbalizes and demonstrates ability to perform ADLs, household chores, and yardwork. Baseline: see objective data Goal status: on going - assessed 06/29/2022   PLAN: PT FREQUENCY: 2x/week  PT DURATION: 8 weeks  PLANNED INTERVENTIONS: Therapeutic exercises, Therapeutic activity, Neuromuscular re-education, Balance training, Gait training, Patient/Family education, Self Care, Joint mobilization, Stair training, Vestibular training, DME instructions, Dry Needling, Electrical stimulation, Spinal mobilization, Cryotherapy, Moist heat, Ultrasound, Ionotophoresis 44m/ml Dexamethasone, Manual therapy, Re-evaluation, and physical performance tests .  PLAN FOR NEXT SESSION:   check her back &/or knee pain, check LTGs  with discussion d/c vs recert,  work on standing balance,    Jamey Reas, PT, DPT 07/13/2022, 11:22 AM

## 2022-07-13 NOTE — Patient Instructions (Signed)
-  single knee to chest stretch 15 sec or 3-5 deep breathes for hold 3 reps ea LE  -hamstring stretch - towel under thigh may be easier - knee to chest then push heel towards ceiling. 15 sec or 3-5 deep breathes for hold 3 reps ea LE  -double knee to chest stretch (bringing one knee up & down at time) 15 sec or 3-5 deep breathes for hold 3 reps.   -double hamstring - bring knees to chest one at time, push one heel towards ceiling for single leg stretch return to both knees to chest, repeat with other leg;  then push one leg at time toward ceiling until both legs are up, lower feet bringing knees to chest one at a time,  then lower both legs to bed one at a time.  15 sec or 3-5 deep breathes for hold 3 reps ea LE  -trunk rotation stretch supine 15 sec or 3-5 deep breathes for hold 3 reps ea direction  -seated lumbar lordosis arching & flexion flattening back 5 sec hold 10 reps ea.  Feet on floor with hands on thighs.  Breath with hold both directions.

## 2022-07-17 ENCOUNTER — Ambulatory Visit (INDEPENDENT_AMBULATORY_CARE_PROVIDER_SITE_OTHER): Payer: Medicare Other | Admitting: Physical Therapy

## 2022-07-17 ENCOUNTER — Encounter: Payer: Self-pay | Admitting: Physical Therapy

## 2022-07-17 DIAGNOSIS — M25561 Pain in right knee: Secondary | ICD-10-CM

## 2022-07-17 DIAGNOSIS — M6281 Muscle weakness (generalized): Secondary | ICD-10-CM | POA: Diagnosis not present

## 2022-07-17 DIAGNOSIS — R2681 Unsteadiness on feet: Secondary | ICD-10-CM | POA: Diagnosis not present

## 2022-07-17 DIAGNOSIS — M5459 Other low back pain: Secondary | ICD-10-CM

## 2022-07-17 DIAGNOSIS — R2689 Other abnormalities of gait and mobility: Secondary | ICD-10-CM | POA: Diagnosis not present

## 2022-07-17 DIAGNOSIS — R600 Localized edema: Secondary | ICD-10-CM

## 2022-07-17 DIAGNOSIS — M25661 Stiffness of right knee, not elsewhere classified: Secondary | ICD-10-CM

## 2022-07-17 NOTE — Therapy (Signed)
OUTPATIENT PHYSICAL THERAPY TREATMENT   Patient Name: Stpehanie Montroy MRN: [id number] DOB:April 22, 1946, 76 y.o., female Today's Date: 07/17/2022  END OF SESSION:  PT End of Session - 07/17/22 1105     Visit Number 12    Number of Visits 16    Date for PT Re-Evaluation 07/21/22    Authorization Type Medicare Part A and B    Progress Note Due on Visit 17    PT Start Time 1104    PT Stop Time 1150    PT Time Calculation (min) 46 min    Activity Tolerance Patient tolerated treatment well    Behavior During Therapy [facility] for tasks assessed/performed                  Past Medical History:  Diagnosis Date   Arthritis    arthritis fingers   Coronary artery calcification 04/24/2017   Diverticulitis    x2 -last 1'17 tx. outpatient.   Environmental and seasonal allergies    2014 Possibly related to air freshners.   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   Family history of anesthesia complication 16BWG ago   daughter had an allergic reaction to atropine   GERD (gastroesophageal reflux disease)    History of hiatal hernia 09/14/2014   History of kidney stones    '80's lithotripsy-[facility] x1   Hyperlipidemia    Hypothyroidism    Pneumonia 2014   Pure hypercholesterolemia    Tremor of left hand    Past Surgical History:  Procedure Laterality Date   BALLOON DILATION N/A 05/04/2014   Procedure: BALLOON DILATION;  Surgeon: Garlan Fair, MD;  Location: [facility];  Service: Endoscopy;  Laterality: N/A;   BREAST LUMPECTOMY Right 1972   benign    BREAST SURGERY Right    '72 -benign tumor   childbirth- NVD x2      CHOLECYSTECTOMY N/A 03/10/2022   Procedure: LAPAROSCOPIC CHOLECYSTECTOMY;  Surgeon: Jovita Kussmaul, MD;  Location: [place];  Service: General;  Laterality: N/A;   COLONOSCOPY WITH PROPOFOL N/A 05/04/2014   Procedure: COLONOSCOPY WITH PROPOFOL;  Surgeon: Garlan Fair, MD;  Location: [facility];   Service: Endoscopy;  Laterality: N/A;   DILATION AND CURETTAGE OF UTERUS     04-20-14 [facility] hospital endometrial polyp removed.   ESOPHAGOGASTRODUODENOSCOPY (EGD) WITH PROPOFOL N/A 05/04/2014   Procedure: ESOPHAGOGASTRODUODENOSCOPY (EGD) WITH PROPOFOL;  Surgeon: Garlan Fair, MD;  Location: [facility];  Service: Endoscopy;  Laterality: N/A;. Procedure done in [place], [place]- 5 yrs ago.   HYSTEROSCOPY WITH D & C N/A 04/20/2014   Procedure: DILATATION AND CURETTAGE /HYSTEROSCOPY;  Surgeon: [name], MD;  Location: [facility];  Service: Gynecology;  Laterality: N/A;   IR PERC CHOLECYSTOSTOMY  01/08/2022   IR RADIOLOGIST EVAL & MGMT  02/10/2022   LAPAROSCOPY N/A 03/14/2022   Procedure: LAPAROSCOPY DIAGNOSTIC  WITH IRRIGATION AND  DRAINAGE OF Lucia Bitter;  Surgeon: Jovita Kussmaul, MD;  Location: [place];  Service: General;  Laterality: N/A;   LITHOTRIPSY     POLYPECTOMY  2020   Dr. Lear Ng   TOTAL KNEE ARTHROPLASTY Left 02/15/2021   Procedure: LEFT TOTAL KNEE ARTHROPLASTY;  Surgeon: Mcarthur Rossetti, MD;  Location: [place];  Service: Orthopedics;  Laterality: Left;   TOTAL KNEE ARTHROPLASTY Right 08/09/2021   Procedure: RIGHT TOTAL KNEE ARTHROPLASTY;  Surgeon: Mcarthur Rossetti, MD;  Location: [place];  Service: Orthopedics;  Laterality:  Right;  Needs RNFA   TUBAL LIGATION     Patient Active Problem List   Diagnosis Date Noted   Hypovolemia due to dehydration 03/15/2022   AKI (acute kidney injury) ([place]) 03/15/2022   Biloma following surgery 03/14/2022   Abdominal pain 03/13/2022   Postoperative pain 03/13/2022   Acute cholecystitis 38/25/0539   Biliary colic 76/73/4193   New onset atrial flutter ([place]) 01/07/2022   DJD (degenerative joint disease) of knee 08/09/2021   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   Unilateral primary osteoarthritis, right knee 06/06/2021   Status post total left knee replacement 02/15/2021   Unilateral primary osteoarthritis, left knee  01/04/2021   Coronary artery calcification 04/24/2017   Hyperlipidemia 04/24/2017   Acute bronchitis 09/29/2014   SBO (small bowel obstruction) ([place]) 09/27/2014   Nausea and vomiting 09/27/2014   Hypothyroidism 09/27/2014   Leukocytosis 09/27/2014    PCP: Leeroy Cha, MD  REFERRING PROVIDER: Jovita Kussmaul, MD  REFERRING DIAG: T81.89XA,K66.8 (ICD-10-CM) - Biloma following surgery, initial encounter G89.18 (ICD-10-CM) - Postoperative pain  Rationale for Evaluation and Treatment Rehabilitation  THERAPY DIAG:  Unsteadiness on feet  Other abnormalities of gait and mobility  Muscle weakness (generalized)  Other low back pain  Acute pain of right knee  Stiffness of right knee, not elsewhere classified  Localized edema  ONSET DATE: 03/17/22 Referral  SUBJECTIVE:                                                                                                                                                                                           SUBJECTIVE STATEMENT: Her back is feeling a lot better with focus on her movements and some exercises.  She had to use Voltaren with increased activities with family activities over weekend.     PERTINENT HISTORY:  Cholecystectomy 03/10/22, laparoscopy 03/14/22 TKA Lt 5/22 Rt 11/22, hypothyroidism, hyperlipidemia, OA  PAIN:  NPRS scale:  2/10 since last PT lowest 2/10 & highest 6/10 Pain location:back upper lumbar & lower thoracic Pain description: dull achy Aggravating factors: standing Relieving factors:  walking & flexing forward  NPRS scale:  3-4/10 used Voltarin prior to PT, since last PT lowest 2/10 & highest 7/10 prior to Voltarin Pain location: Rt anterior-lateral knee and patella Pain description: soreness Aggravating factors: first getting up in the morning or initially moving after knee stiffening, bending Relieving factors: moving  PRECAUTIONS: None  WEIGHT BEARING RESTRICTIONS No  FALLS:  Has patient  fallen in last 6 months? No  LIVING ENVIRONMENT: Lives with: lives with their spouse Lives in: House/apartment Stairs: Yes: Internal: 13 steps; on left going up and External: 2  steps; can reach both Has following equipment at home: Single point cane, Walker - 2 wheeled, Manufacturing engineer  OCCUPATION: retired, for leisure she enjoys hiking  PLOF: [place] return to activity level prior to surgery without pain   OBJECTIVE:   PATIENT SURVEYS:  07/17/2022:  FOTO 50% 06/20/2022 FOTO 53% 05/29/2022 FOTO  44%, target 55%  COGNITION: 05/29/2022 Overall cognitive status: Within functional limits for tasks assessed    SENSATION: 05/29/2022 Not tested  POSTURE:  05/29/2022 flexed trunk  and weight shift left  PALPATION: 06/20/2022 - tenderness to palpation at distal IT band, no tenderness at joint line  LUMBAR ROM:   Active AROM  05/29/2022  Flexion   Extension   Right lateral flexion   Left lateral flexion   Right rotation   Left rotation    (Blank rows = not tested)  LOWER EXTREMITY ROM:     ROM (A: AROM, P: PROM) Right Eval 05/29/2022 Left Eval 05/29/2022 Right 06/20/22 Right 07/17/22  Hip flexion      Hip extension      Hip abduction      Hip adduction      Hip internal rotation      Hip external rotation      Knee flexion      Knee extension Seated LAQ A: -8* Seated LAQ WNL Seated  LAQ A: -2*   Ankle dorsiflexion      Ankle plantarflexion      Ankle inversion      Ankle eversion       (Blank rows = not tested)  LOWER EXTREMITY MMT:    MMT Right Eval 05/29/2022 Left Eval 05/29/2022 Right 06/20/22 Left  06/20/22  Hip flexion      Hip extension      Hip abduction      Hip adduction      Hip internal rotation      Hip external rotation      Knee flexion Seated  4/5 painful Seated  4/5 Seated HH Dynameter 35.8# & 36.7# RLE:LLE 86.7% Seated HH Dynameter 40.8# & 43.1#  Knee extension Seated  4/5 painful Seated  5/5 Seated  HH Dynameter 52.9# & 54.1# RLE:LLE 99.7% Seated HH dynameter 55.9# & 52.4#  Ankle dorsiflexion      Ankle plantarflexion      Ankle inversion      Ankle eversion       (Blank rows = not tested)  FUNCTIONAL TESTS:  07/17/2022 Functional Gait Assessment: 23/30  [facility] PT Assessment - 07/17/22 1105       Functional Gait  Assessment   Gait assessed  Yes    Gait Level Surface Walks 20 ft in less than 5.5 sec, no assistive devices, good speed, no evidence for imbalance, normal gait pattern, deviates no more than 6 in outside of the 12 in walkway width.    Change in Gait Speed Able to smoothly change walking speed without loss of balance or gait deviation. Deviate no more than 6 in outside of the 12 in walkway width.    Gait with Horizontal Head Turns Performs head turns smoothly with slight change in gait velocity (eg, minor disruption to smooth gait path), deviates 6-10 in outside 12 in walkway width, or uses an assistive device.    Gait with Vertical Head Turns Performs task with slight change in gait velocity (eg, minor disruption to smooth gait path), deviates 6 - 10 in outside 12 in walkway width or uses assistive device  Gait and Pivot Turn Pivot turns safely within 3 sec and stops quickly with no loss of balance.    Step Over Obstacle Is able to step over one shoe box (4.5 in total height) without changing gait speed. No evidence of imbalance.    Gait with Narrow Base of Support Ambulates 7-9 steps.    Gait with Eyes Closed Walks 20 ft, uses assistive device, slower speed, mild gait deviations, deviates 6-10 in outside 12 in walkway width. Ambulates 20 ft in less than 9 sec but greater than 7 sec.    Ambulating Backwards Walks 20 ft, uses assistive device, slower speed, mild gait deviations, deviates 6-10 in outside 12 in walkway width.    Steps Alternating feet, must use rail.   descend with rail & ascend without rail   Total Score 23              06/20/2022 Functional gait  assessment: 21/30  [facility] PT Assessment - 07/17/22 1105       Functional Gait  Assessment   Gait assessed  Yes    Gait Level Surface Walks 20 ft in less than 5.5 sec, no assistive devices, good speed, no evidence for imbalance, normal gait pattern, deviates no more than 6 in outside of the 12 in walkway width.    Change in Gait Speed Able to smoothly change walking speed without loss of balance or gait deviation. Deviate no more than 6 in outside of the 12 in walkway width.    Gait with Horizontal Head Turns Performs head turns smoothly with slight change in gait velocity (eg, minor disruption to smooth gait path), deviates 6-10 in outside 12 in walkway width, or uses an assistive device.    Gait with Vertical Head Turns Performs task with slight change in gait velocity (eg, minor disruption to smooth gait path), deviates 6 - 10 in outside 12 in walkway width or uses assistive device    Gait and Pivot Turn Pivot turns safely within 3 sec and stops quickly with no loss of balance.    Step Over Obstacle Is able to step over one shoe box (4.5 in total height) without changing gait speed. No evidence of imbalance.    Gait with Narrow Base of Support Ambulates 7-9 steps.    Gait with Eyes Closed Walks 20 ft, uses assistive device, slower speed, mild gait deviations, deviates 6-10 in outside 12 in walkway width. Ambulates 20 ft in less than 9 sec but greater than 7 sec.    Ambulating Backwards Walks 20 ft, uses assistive device, slower speed, mild gait deviations, deviates 6-10 in outside 12 in walkway width.    Steps Alternating feet, must use rail.   descend with rail & ascend without rail   Total Score 23            05/29/2022 5 times sit to stand: 11.38 sec, no UE use  (cutoff for older adults is <12sec)  Functional gait assessment: 11/30  [facility] PT Assessment - 05/29/22 0001                Functional Gait  Assessment    Gait assessed  Yes     Gait Level Surface Walks 20 ft, slow speed,  abnormal gait pattern, evidence for imbalance or deviates 10-15 in outside of the 12 in walkway width. Requires more than 7 sec to ambulate 20 ft.     Change in Gait Speed Makes only minor adjustments to walking speed, or accomplishes a  change in speed with significant gait deviations, deviates 10-15 in outside the 12 in walkway width, or changes speed but loses balance but is able to recover and continue walking.     Gait with Horizontal Head Turns Performs head turns with moderate changes in gait velocity, slows down, deviates 10-15 in outside 12 in walkway width but recovers, can continue to walk.     Gait with Vertical Head Turns Performs task with moderate change in gait velocity, slows down, deviates 10-15 in outside 12 in walkway width but recovers, can continue to walk.     Gait and Pivot Turn Pivot turns safely in greater than 3 sec and stops with no loss of balance, or pivot turns safely within 3 sec and stops with mild imbalance, requires small steps to catch balance.     Step Over Obstacle Is able to step over one shoe box (4.5 in total height) but must slow down and adjust steps to clear box safely. May require verbal cueing.     Gait with Narrow Base of Support Ambulates less than 4 steps heel to toe or cannot perform without assistance.     Gait with Eyes Closed Walks 20 ft, slow speed, abnormal gait pattern, evidence for imbalance, deviates 10-15 in outside 12 in walkway width. Requires more than 9 sec to ambulate 20 ft.     Ambulating Backwards Walks 20 ft, uses assistive device, slower speed, mild gait deviations, deviates 6-10 in outside 12 in walkway width.     Steps Two feet to a stair, must use rail.     Total Score 11        GAIT: 07/17/2022: Pt amb without device  Gait velocity:  comfortable 4.03 ft/sec  fast pace 4.89 ft/sec   06/16/2022:  Independent ambulation into and through clinic between activity.  Less occurrences of lateral instability but still noted on occasion in  stance phase SL for both LE.   05/29/2022 Distance walked: 200' Assistive device utilized: None Level of assistance: SBA Comments: shortened step length bil., flexed trunk, some M-L instability and near losses of balance  TODAY'S TREATMENT  07/17/2022 Therapeutic Activities: PT verbally reviewed sit/stand, getting in/out of high bed & supine/sit. Pt verbalizes understanding & notes that it is helping. PT demo & verbal cues on Gentry Fitz to work on trigger points that pt reported over weekend.  She used our [employer] on her upper Trap & posterior neck. She verbalized helped & understands use. PT demo & verbal cues on using 4" blocks under feet sitting in chair which helps support feet & sit back in chair.  With her height she reports significant improvement in comfort.  PT recommended light weight item that can be slid in/out of way easily. Pt verbalized & return demo understanding. Working on upright posture:  1) standing with posterior pelvis to counter with hands on counter, pushing upward with BLEs & trunk. PT recommended 2x/day for 2+ minutes.  2)standing with back to door frame with single UE & BUE overhead reach hold for 2 deep breaths with recommendation when exiting bathroom for 4+ x/day.    Pt verbalized & return demo understanding of both exercises.   Gait: see objective for testing & functional outcomes.  07/13/2022 Therapeutic Activities: Pt demo proper form with sit to/from stand and sit to/from supine via sidelying going both ways.   Pt reported issues with getting in/out of high bed.  Her bed comes to her buttocks when standing in front. PT set  mat table to similar height.  PT demo & verbal cues on lifting one hip with rotation onto bed then scoot back on bed. Getting out with positioning rotation & sliding single leg to floor.  Pt return demo & verbalized understanding getting in & out of high bed. She reported less stress on her back.   Therapeutic Exercise: Pt reviewed &  updated HEP as noted below with tactile, verbal & HO. Pt verbalized & return demo understanding. Pt reported added single & double hamstring stretches on her own because felt "good" to her back.  PT instructed in form and included in HEP.  Access Code: [email or web address] URL: [email or web address] Date: 07/13/2022 Prepared by: Jamey Reas  Exercises - Hooklying Single Knee to Chest Stretch  - 2-4 x daily - 7 x weekly - 1 sets - 3 reps - 15 seconds hold - Supine Double Knee to Chest Modified  - 2-4 x daily - 7 x weekly - 1 sets - 3 reps - 15 seconds hold - Supine Hamstring Stretch  - 2-4 x daily - 7 x weekly - 1 sets - 3 reps - 15 seconds hold - Supine Lower Trunk Rotation  - 2-4 x daily - 7 x weekly - 1 sets - 3 reps - 15 seconds hold - Seated Pelvic Tilt  - 2-4 x daily - 7 x weekly - 1-3 sets - 10 reps - 5 seconds hold  -single knee to chest stretch 15 sec or 3-5 deep breathes for hold 3 reps ea LE  -hamstring stretch - towel under thigh may be easier - knee to chest then push heel towards ceiling. 15 sec or 3-5 deep breathes for hold 3 reps ea LE  -double knee to chest stretch (bringing one knee up & down at time) 15 sec or 3-5 deep breathes for hold 3 reps.   -double hamstring - bring knees to chest one at time, push one heel towards ceiling for single leg stretch return to both knees to chest, repeat with other leg;  then push one leg at time toward ceiling until both legs are up, lower feet bringing knees to chest one at a time,  then lower both legs to bed one at a time.  15 sec or 3-5 deep breathes for hold 3 reps ea LE  -trunk rotation stretch supine 15 sec or 3-5 deep breathes for hold 3 reps ea direction  -seated lumbar lordosis arching & flexion flattening back 5 sec hold 10 reps ea.  Feet on floor with hands on thighs.  Breath with hold both directions.   07/10/2022 Self-care: PT demo & verbal cues on sit to/from stand technique.  Pt return demo understanding & reports  more comfortable on her back & knee.  PT demo & verbal cues on supine to/from sit via sidelying. Pt return demo understanding & reports no back strain exp compared to how she was doing it.  PT demo & verbal cues on using pillows to position & "tent" sheets in sidelying. Pt verbalized understanding & reports less back & knee strain.   Therapeutic exercise: -single knee to chest stretch 15 sec hold 3 reps ea LE -double knee to chest stretch (bringing one knee up & down at time) 15 sec 3 reps.  -trunk rotation stretch supine 15 sec hold 3 reps ea direction -seated lumbar lordosis arching & flexion 5 sec hold 10 reps ea -stop standing theraband exercises for now.  -continue standing ITB stretch Pt & husband verbalized understanding.  PATIENT EDUCATION:  06/01/2022 Education details: HEP Person educated: Patient Education method: Explanation, cues, handout Education comprehension: verbalized understanding, demonstration  HOME EXERCISE PROGRAM: Access Code: [email or web address] URL: [email or web address] Date: 06/06/2022 Prepared by: Jamey Reas  Exercises - Sit to Stand  - 3 x daily - 7 x weekly - 1 sets - 10 reps - Seated Straight Leg Heel Taps  - 1-2 x daily - 7 x weekly - 1-2 sets - 10 reps - Tandem Stance  - 1-2 x daily - 7 x weekly - 1 sets - 1-2 reps - 30- 60 sec hold - [place] with Counter Support  - 1-2 x daily - 7 x weekly - 1 sets - 10 reps - Standing hip extension alternating legs  - 1-2 x daily - 7 x weekly - 1 sets - 10 reps - 5 seconds hold - Standing hip abduction alternating legs  - 1-2 x daily - 7 x weekly - 1 sets - 10 reps - 5 seconds hold - Standing March with Counter Support  - 1-2 x daily - 7 x weekly - 1 sets - 10 reps - 5 seconds hold  ASSESSMENT:  CLINICAL IMPRESSION: Patient is functioning with less back pain using PT recommendations.  She verbalized understanding of updated activities.  Pt appears would benefit from a few more weeks of PT.     OBJECTIVE IMPAIRMENTS Abnormal gait, decreased activity tolerance, decreased balance, decreased endurance, decreased mobility, difficulty walking, decreased ROM, decreased strength, postural dysfunction, and pain.   ACTIVITY LIMITATIONS carrying, lifting, bending, squatting, stairs, toileting, dressing, locomotion level, and caring for others  PARTICIPATION LIMITATIONS: meal prep, cleaning, laundry, driving, community activity, and yard work  [place], Time since onset of injury/illness/exacerbation, and 3+ comorbidities: see PMH  are also affecting patient's functional outcome.   REHAB POTENTIAL: Good  CLINICAL DECISION MAKING: Evolving/moderate complexity  EVALUATION COMPLEXITY: Low   GOALS: Goals reviewed with patient? Yes  SHORT TERM GOALS: Target date: 06/23/22  Patient will be independent with HEP to continue progressing outside of clinic. Baseline: see objective data Goal status: met 06/20/22  2.  Patient scores above a 18/30 on the FGA to represent reduced fall risk with gait activities. Baseline: see objective data Goal status: met 06/20/22   LONG TERM GOALS: Target date: 07/21/22  Patient will score FOTO >/= 55% to reflect improved self-perceived functional ability. Baseline: see objective data Goal status: on going - assessed 07/17/2022   2.  Patient scores above a 25/30 on the FGA to represent low fall risk with gait activities. Baseline: see objective data Goal status: on going - assessed 07/17/2022  3.  Patient reports </= 2/10 back and knee pain with standing, gait, and stair activity. Baseline: see objective data Goal status: on going - assessed 07/17/2022  4.  Patient Rt knee seated LAQ to -2* to represent improved knee range and quadriceps strength. Baseline: see objective data Goal status: ongoing assessed 07/17/2022  5.  Patient safely ambulates 500' including ramp, curb, and stairs independently to allow for household and community  mobility. Baseline: see objective data Goal status: on going - assessed 06/29/2022  6.  Patient verbalizes and demonstrates ability to perform ADLs, household chores, and yardwork. Baseline: see objective data Goal status: on going - assessed 06/29/2022   PLAN: PT FREQUENCY: 2x/week  PT DURATION: 8 weeks  PLANNED INTERVENTIONS: Therapeutic exercises, Therapeutic activity, Neuromuscular re-education, Balance training, Gait training, Patient/Family education, Self Care, Joint mobilization, Stair training, Vestibular training, DME instructions, Dry Needling, Electrical  stimulation, Spinal mobilization, Cryotherapy, Moist heat, Ultrasound, Ionotophoresis 53m/ml Dexamethasone, Manual therapy, Re-evaluation, and physical performance tests .  PLAN FOR NEXT SESSION:   recertify PT for 6 more weeks with updated LTGs.     RJamey Reas PT, DPT 07/17/2022, 12:27 PM

## 2022-07-18 ENCOUNTER — Encounter: Payer: Medicare Other | Admitting: Physical Therapy

## 2022-07-18 ENCOUNTER — Encounter: Payer: Medicare Other | Admitting: Rehabilitative and Restorative Service Providers"

## 2022-07-19 ENCOUNTER — Encounter: Payer: Self-pay | Admitting: Physical Therapy

## 2022-07-19 ENCOUNTER — Ambulatory Visit (INDEPENDENT_AMBULATORY_CARE_PROVIDER_SITE_OTHER): Payer: Medicare Other | Admitting: Physical Therapy

## 2022-07-19 DIAGNOSIS — R2689 Other abnormalities of gait and mobility: Secondary | ICD-10-CM

## 2022-07-19 DIAGNOSIS — R2681 Unsteadiness on feet: Secondary | ICD-10-CM

## 2022-07-19 DIAGNOSIS — M25561 Pain in right knee: Secondary | ICD-10-CM

## 2022-07-19 DIAGNOSIS — M5459 Other low back pain: Secondary | ICD-10-CM | POA: Diagnosis not present

## 2022-07-19 DIAGNOSIS — M6281 Muscle weakness (generalized): Secondary | ICD-10-CM | POA: Diagnosis not present

## 2022-07-19 DIAGNOSIS — M25661 Stiffness of right knee, not elsewhere classified: Secondary | ICD-10-CM

## 2022-07-19 NOTE — Therapy (Signed)
OUTPATIENT PHYSICAL THERAPY TREATMENT & RECERTIFICATION   Patient Name: Katie Weber MRN: [id number] DOB:[birth year], 76 y.o., female Today's Date: 07/19/2022  END OF SESSION:  PT End of Session - 07/19/22 1104     Visit Number 13    Number of Visits 25    Date for PT Re-Evaluation 08/30/22    Authorization Type Medicare Part A and B    Progress Note Due on Visit 71    PT Start Time 1102    PT Stop Time 1145    PT Time Calculation (min) 43 min    Activity Tolerance Patient tolerated treatment well    Behavior During Therapy [facility] for tasks assessed/performed                   Past Medical History:  Diagnosis Date   Arthritis    arthritis fingers   Coronary artery calcification 04/24/2017   Diverticulitis    x2 -last 1'17 tx. outpatient.   Environmental and seasonal allergies    2014 Possibly related to air freshners.   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   Family history of anesthesia complication 25KNL ago   daughter had an allergic reaction to atropine   GERD (gastroesophageal reflux disease)    History of hiatal hernia 09/14/2014   History of kidney stones    '80's lithotripsy-[facility] x1   Hyperlipidemia    Hypothyroidism    Pneumonia 2014   Pure hypercholesterolemia    Tremor of left hand    Past Surgical History:  Procedure Laterality Date   BALLOON DILATION N/A 05/04/2014   Procedure: BALLOON DILATION;  Surgeon: Garlan Fair, MD;  Location: [facility];  Service: Endoscopy;  Laterality: N/A;   BREAST LUMPECTOMY Right 1972   benign    BREAST SURGERY Right    '72 -benign tumor   childbirth- NVD x2      CHOLECYSTECTOMY N/A 03/10/2022   Procedure: LAPAROSCOPIC CHOLECYSTECTOMY;  Surgeon: Jovita Kussmaul, MD;  Location: [place];  Service: General;  Laterality: N/A;   COLONOSCOPY WITH PROPOFOL N/A 05/04/2014   Procedure: COLONOSCOPY WITH PROPOFOL;  Surgeon: Garlan Fair, MD;   Location: [facility];  Service: Endoscopy;  Laterality: N/A;   DILATION AND CURETTAGE OF UTERUS     04-20-14 [facility] hospital endometrial polyp removed.   ESOPHAGOGASTRODUODENOSCOPY (EGD) WITH PROPOFOL N/A 05/04/2014   Procedure: ESOPHAGOGASTRODUODENOSCOPY (EGD) WITH PROPOFOL;  Surgeon: Garlan Fair, MD;  Location: [facility];  Service: Endoscopy;  Laterality: N/A;. Procedure done in [place], [place]- 5 yrs ago.   HYSTEROSCOPY WITH D & C N/A 04/20/2014   Procedure: DILATATION AND CURETTAGE /HYSTEROSCOPY;  Surgeon: [name], MD;  Location: [facility];  Service: Gynecology;  Laterality: N/A;   IR PERC CHOLECYSTOSTOMY  01/08/2022   IR RADIOLOGIST EVAL & MGMT  02/10/2022   LAPAROSCOPY N/A 03/14/2022   Procedure: LAPAROSCOPY DIAGNOSTIC  WITH IRRIGATION AND  DRAINAGE OF Lucia Bitter;  Surgeon: Jovita Kussmaul, MD;  Location: [place];  Service: General;  Laterality: N/A;   LITHOTRIPSY     POLYPECTOMY  2020   Dr. Lear Ng   TOTAL KNEE ARTHROPLASTY Left 02/15/2021   Procedure: LEFT TOTAL KNEE ARTHROPLASTY;  Surgeon: Mcarthur Rossetti, MD;  Location: [place];  Service: Orthopedics;  Laterality: Left;   TOTAL KNEE ARTHROPLASTY Right 08/09/2021   Procedure: RIGHT TOTAL KNEE ARTHROPLASTY;  Surgeon: Mcarthur Rossetti, MD;  Location: [place];  Service:  Orthopedics;  Laterality: Right;  Needs RNFA   TUBAL LIGATION     Patient Active Problem List   Diagnosis Date Noted   Hypovolemia due to dehydration 03/15/2022   AKI (acute kidney injury) ([place]) 03/15/2022   Biloma following surgery 03/14/2022   Abdominal pain 03/13/2022   Postoperative pain 03/13/2022   Acute cholecystitis 22/11/5425   Biliary colic 04/01/7627   New onset atrial flutter ([place]) 01/07/2022   DJD (degenerative joint disease) of knee 08/09/2021   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   Unilateral primary osteoarthritis, right knee 06/06/2021   Status post total left knee replacement 02/15/2021   Unilateral primary  osteoarthritis, left knee 01/04/2021   Coronary artery calcification 04/24/2017   Hyperlipidemia 04/24/2017   Acute bronchitis 09/29/2014   SBO (small bowel obstruction) ([place]) 09/27/2014   Nausea and vomiting 09/27/2014   Hypothyroidism 09/27/2014   Leukocytosis 09/27/2014    PCP: Leeroy Cha, MD  REFERRING PROVIDER: Jovita Kussmaul, MD  REFERRING DIAG: T81.89XA,K66.8 (ICD-10-CM) - Biloma following surgery, initial encounter G89.18 (ICD-10-CM) - Postoperative pain  Rationale for Evaluation and Treatment Rehabilitation  THERAPY DIAG:  Unsteadiness on feet  Other abnormalities of gait and mobility  Muscle weakness (generalized)  Other low back pain  Acute pain of right knee  Stiffness of right knee, not elsewhere classified  ONSET DATE: 03/17/22 Referral  SUBJECTIVE:                                                                                                                                                                                           SUBJECTIVE STATEMENT: She feels she is better but needs more core & leg strength & flexibility and confidence.  She noticed forward balance loss with sudden stop.    PERTINENT HISTORY:  Cholecystectomy 03/10/22, laparoscopy 03/14/22 TKA Lt 5/22 Rt 11/22, hypothyroidism, hyperlipidemia, OA  PAIN:  NPRS scale:  0.5/10 since last PT lowest 0.5/10 & highest 2/10 Pain location:back upper lumbar & lower thoracic Pain description: dull achy Aggravating factors: standing Relieving factors:  walking & flexing forward  NPRS scale:  3/10 used Voltarin prior to PT, since last PT lowest 2/10 & highest 8-9/10 prior to Voltarin Pain location: Rt anterior-lateral knee and patella Pain description: soreness Aggravating factors: first getting up in the morning or initially moving after knee stiffening, bending Relieving factors: moving  PRECAUTIONS: None  WEIGHT BEARING RESTRICTIONS No  FALLS:  Has patient fallen in last 6  months? No  LIVING ENVIRONMENT: Lives with: lives with their spouse Lives in: House/apartment Stairs: Yes: Internal: 13 steps; on left going up and External: 2 steps; can reach both Has  following equipment at home: Single point cane, Walker - 2 wheeled, Manufacturing engineer  OCCUPATION: retired, for leisure she enjoys hiking  PLOF: Jack return to activity level prior to surgery without pain   OBJECTIVE:   PATIENT SURVEYS:  07/17/2022:  FOTO 50% 06/20/2022 FOTO 53% 05/29/2022 FOTO  44%, target 55%  COGNITION: 05/29/2022 Overall cognitive status: Within functional limits for tasks assessed    SENSATION: 05/29/2022 Not tested  POSTURE:  05/29/2022 flexed trunk  and weight shift left  PALPATION: 06/20/2022 - tenderness to palpation at distal IT band, no tenderness at joint line  LUMBAR ROM:   Active AROM  05/29/2022  Flexion   Extension   Right lateral flexion   Left lateral flexion   Right rotation   Left rotation    (Blank rows = not tested)  LOWER EXTREMITY ROM:     ROM (A: AROM, P: PROM) Right Eval 05/29/2022 Left Eval 05/29/2022 Right 06/20/22 Right 07/19/22  Hip flexion      Hip extension      Hip abduction      Hip adduction      Hip internal rotation      Hip external rotation      Knee flexion      Knee extension Seated LAQ A: -8* Seated LAQ WNL Seated  LAQ A: -2* Seated LAQ -2*  Ankle dorsiflexion      Ankle plantarflexion      Ankle inversion      Ankle eversion       (Blank rows = not tested)  LOWER EXTREMITY MMT:    MMT Right Eval 05/29/2022 Left Eval 05/29/2022 Right 06/20/22 Left  06/20/22  Hip flexion      Hip extension      Hip abduction      Hip adduction      Hip internal rotation      Hip external rotation      Knee flexion Seated  4/5 painful Seated  4/5 Seated HH Dynameter 35.8# & 36.7# RLE:LLE 86.7% Seated HH Dynameter 40.8# & 43.1#  Knee extension Seated  4/5 painful Seated  5/5 Seated  HH Dynameter 52.9# & 54.1# RLE:LLE 99.7% Seated HH dynameter 55.9# & 52.4#  Ankle dorsiflexion      Ankle plantarflexion      Ankle inversion      Ankle eversion       (Blank rows = not tested)  FUNCTIONAL TESTS:  07/17/2022 Functional Gait Assessment: 23/30     06/20/2022 Functional gait assessment: 21/30   05/29/2022 5 times sit to stand: 11.38 sec, no UE use  (cutoff for older adults is <12sec)  Functional gait assessment: 11/30  [facility] PT Assessment - 05/29/22 0001                Functional Gait  Assessment    Gait assessed  Yes     Gait Level Surface Walks 20 ft, slow speed, abnormal gait pattern, evidence for imbalance or deviates 10-15 in outside of the 12 in walkway width. Requires more than 7 sec to ambulate 20 ft.     Change in Gait Speed Makes only minor adjustments to walking speed, or accomplishes a change in speed with significant gait deviations, deviates 10-15 in outside the 12 in walkway width, or changes speed but loses balance but is able to recover and continue walking.     Gait with Horizontal Head Turns Performs head turns with moderate changes in gait velocity, slows down,  deviates 10-15 in outside 12 in walkway width but recovers, can continue to walk.     Gait with Vertical Head Turns Performs task with moderate change in gait velocity, slows down, deviates 10-15 in outside 12 in walkway width but recovers, can continue to walk.     Gait and Pivot Turn Pivot turns safely in greater than 3 sec and stops with no loss of balance, or pivot turns safely within 3 sec and stops with mild imbalance, requires small steps to catch balance.     Step Over Obstacle Is able to step over one shoe box (4.5 in total height) but must slow down and adjust steps to clear box safely. May require verbal cueing.     Gait with Narrow Base of Support Ambulates less than 4 steps heel to toe or cannot perform without assistance.     Gait with Eyes Closed Walks 20 ft, slow speed,  abnormal gait pattern, evidence for imbalance, deviates 10-15 in outside 12 in walkway width. Requires more than 9 sec to ambulate 20 ft.     Ambulating Backwards Walks 20 ft, uses assistive device, slower speed, mild gait deviations, deviates 6-10 in outside 12 in walkway width.     Steps Two feet to a stair, must use rail.     Total Score 11        GAIT: 07/17/2022: Pt amb without device  Gait velocity:  comfortable 4.03 ft/sec  fast pace 4.89 ft/sec   06/16/2022:  Independent ambulation into and through clinic between activity.  Less occurrences of lateral instability but still noted on occasion in stance phase SL for both LE.   05/29/2022 Distance walked: 200' Assistive device utilized: None Level of assistance: SBA Comments: shortened step length bil., flexed trunk, some M-L instability and near losses of balance  TODAY'S TREATMENT  07/19/2022 Gait Training: Ambulating while tossing tennis ball between hands, tossing ball upward, tapping LUE on leg then singing song in her head.  She improved her LUE tremors.   Worked on sudden stops, then PT bumping pt from side, back & forward with occasional minA to catch balance.   Neuromuscular Re-education: Tandem stance on foam beam 30 sec RLE in front & in back.   Tandem stance on floor tossing ball 5' apart.  Worked on step strategy anterior, posterior & lateral reactionary with sudden release of resistance. Added as HEP in corner standing on towel roll anticipatory stepping forward, backward & to sides.  Pt return demo & verbalized understanding.    07/17/2022 Therapeutic Activities: PT verbally reviewed sit/stand, getting in/out of high bed & supine/sit. Pt verbalizes understanding & notes that it is helping. PT demo & verbal cues on [place] Fitz to work on trigger points that pt reported over weekend.  She used our [employer] on her upper Trap & posterior neck. She verbalized helped & understands use. PT demo & verbal cues on using 4"  blocks under feet sitting in chair which helps support feet & sit back in chair.  With her height she reports significant improvement in comfort.  PT recommended light weight item that can be slid in/out of way easily. Pt verbalized & return demo understanding. Working on upright posture:  1) standing with posterior pelvis to counter with hands on counter, pushing upward with BLEs & trunk. PT recommended 2x/day for 2+ minutes.  2)standing with back to door frame with single UE & BUE overhead reach hold for 2 deep breaths with recommendation when exiting bathroom for  4+ x/day.    Pt verbalized & return demo understanding of both exercises.   Gait: see objective for testing & functional outcomes.  07/13/2022 Therapeutic Activities: Pt demo proper form with sit to/from stand and sit to/from supine via sidelying going both ways.   Pt reported issues with getting in/out of high bed.  Her bed comes to her buttocks when standing in front. PT set mat table to similar height.  PT demo & verbal cues on lifting one hip with rotation onto bed then scoot back on bed. Getting out with positioning rotation & sliding single leg to floor.  Pt return demo & verbalized understanding getting in & out of high bed. She reported less stress on her back.   Therapeutic Exercise: Pt reviewed & updated HEP as noted below with tactile, verbal & HO. Pt verbalized & return demo understanding. Pt reported added single & double hamstring stretches on her own because felt "good" to her back.  PT instructed in form and included in HEP.  Access Code: [email or web address] URL: [email or web address] Date: 07/13/2022 Prepared by: Jamey Reas  Exercises - Hooklying Single Knee to Chest Stretch  - 2-4 x daily - 7 x weekly - 1 sets - 3 reps - 15 seconds hold - Supine Double Knee to Chest Modified  - 2-4 x daily - 7 x weekly - 1 sets - 3 reps - 15 seconds hold - Supine Hamstring Stretch  - 2-4 x daily - 7 x weekly - 1 sets - 3 reps  - 15 seconds hold - Supine Lower Trunk Rotation  - 2-4 x daily - 7 x weekly - 1 sets - 3 reps - 15 seconds hold - Seated Pelvic Tilt  - 2-4 x daily - 7 x weekly - 1-3 sets - 10 reps - 5 seconds hold  -single knee to chest stretch 15 sec or 3-5 deep breathes for hold 3 reps ea LE  -hamstring stretch - towel under thigh may be easier - knee to chest then push heel towards ceiling. 15 sec or 3-5 deep breathes for hold 3 reps ea LE  -double knee to chest stretch (bringing one knee up & down at time) 15 sec or 3-5 deep breathes for hold 3 reps.   -double hamstring - bring knees to chest one at time, push one heel towards ceiling for single leg stretch return to both knees to chest, repeat with other leg;  then push one leg at time toward ceiling until both legs are up, lower feet bringing knees to chest one at a time,  then lower both legs to bed one at a time.  15 sec or 3-5 deep breathes for hold 3 reps ea LE  -trunk rotation stretch supine 15 sec or 3-5 deep breathes for hold 3 reps ea direction  -seated lumbar lordosis arching & flexion flattening back 5 sec hold 10 reps ea.  Feet on floor with hands on thighs.  Breath with hold both directions.    PATIENT EDUCATION:  06/01/2022 Education details: HEP Person educated: Patient Education method: Explanation, cues, handout Education comprehension: verbalized understanding, demonstration  HOME EXERCISE PROGRAM: Access Code: [email or web address] URL: [email or web address] Date: 06/06/2022 Prepared by: Jamey Reas  Exercises - Sit to Stand  - 3 x daily - 7 x weekly - 1 sets - 10 reps - Seated Straight Leg Heel Taps  - 1-2 x daily - 7 x weekly - 1-2 sets - 10 reps - Tandem Stance  - 1-2 x daily - 7 x  weekly - 1 sets - 1-2 reps - 30- 60 sec hold - [place] with Counter Support  - 1-2 x daily - 7 x weekly - 1 sets - 10 reps - Standing hip extension alternating legs  - 1-2 x daily - 7 x weekly - 1 sets - 10 reps - 5 seconds hold - Standing  hip abduction alternating legs  - 1-2 x daily - 7 x weekly - 1 sets - 10 reps - 5 seconds hold - Standing March with Counter Support  - 1-2 x daily - 7 x weekly - 1 sets - 10 reps - 5 seconds hold  ASSESSMENT:  CLINICAL IMPRESSION: She improved mobility then had a flare up with back pain.  Her balance & mobility declined some due to limited activities.  Patient improved functioning with less back pain using PT recommendations.  Pt appears would benefit from a few more weeks of PT to improve her balance & function.  Pt feels that she has improved but needs more PT also.    OBJECTIVE IMPAIRMENTS Abnormal gait, decreased activity tolerance, decreased balance, decreased endurance, decreased mobility, difficulty walking, decreased ROM, decreased strength, postural dysfunction, and pain.   ACTIVITY LIMITATIONS carrying, lifting, bending, squatting, stairs, toileting, dressing, locomotion level, and caring for others  PARTICIPATION LIMITATIONS: meal prep, cleaning, laundry, driving, community activity, and yard work  [place], Time since onset of injury/illness/exacerbation, and 3+ comorbidities: see PMH  are also affecting patient's functional outcome.   REHAB POTENTIAL: Good  CLINICAL DECISION MAKING: Evolving/moderate complexity  EVALUATION COMPLEXITY: Low   GOALS: Goals reviewed with patient? Yes  SHORT TERM GOALS: Target date: 08/10/22  Patient will be independent with updated HEP to continue progressing outside of clinic. Baseline: see objective data Goal status: ongoing 07/19/2022  2.  Patient able to recover balance within 1 step for step strategy testing anterior, posterior & laterally. Baseline: see objective data Goal status: set 07/19/2022   LONG TERM GOALS: Target date: 07/21/22  Patient will score FOTO >/= 55% to reflect improved self-perceived functional ability. Baseline: see objective data Goal status: on going - assessed 07/17/2022   2.  Patient  scores above a 25/30 on the FGA to represent low fall risk with gait activities. Baseline: see objective data Goal status: on going - assessed 07/17/2022  3.  Patient reports </= 2/10 back and knee pain with standing, gait, and stair activity. Baseline: see objective data Goal status: on going - assessed 07/17/2022  4.  Patient Rt knee seated LAQ to -2* to represent improved knee range and quadriceps strength. Baseline: see objective data Goal status: ongoing assessed 07/17/2022  5.  Patient safely ambulates 500' including ramp, curb, and stairs independently to allow for household and community mobility. Baseline: see objective data Goal status: on going - assessed 06/29/2022  6.  Patient verbalizes and demonstrates ability to perform ADLs, household chores, and yardwork. Baseline: see objective data Goal status: on going - assessed 06/29/2022  UPDATED LONG TERM GOALS: Target date: 08/30/22  Patient will score FOTO >/= 55% to reflect improved self-perceived functional ability. Baseline: see objective data Goal status: on going - assessed 07/17/2022   2.  Patient scores above a 25/30 on the FGA to represent low fall risk with gait activities. Baseline: see objective data Goal status: on going - assessed 07/17/2022  3.  Patient reports </= 2/10 back and knee pain with standing, gait, and stair activity. Baseline: see objective data Goal status: on going - assessed 07/17/2022  4.  Patient Rt knee seated LAQ to -2* to represent improved knee range and quadriceps strength. Baseline: see objective data Goal status: ongoing assessed 07/17/2022  5.  Patient safely ambulates 500' including ramp, curb, and stairs independently to allow for household and community mobility. Baseline: see objective data Goal status: on going - assessed 06/29/2022  6.  Patient verbalizes and demonstrates ability to perform ADLs, household chores, and yardwork. Baseline: see objective data Goal status: on going  - assessed 06/29/2022  PLAN: PT FREQUENCY: 2x/week  PT DURATION: 6 weeks  PLANNED INTERVENTIONS: Therapeutic exercises, Therapeutic activity, Neuromuscular re-education, Balance training, Gait training, Patient/Family education, Self Care, Joint mobilization, Stair training, Vestibular training, DME instructions, Dry Needling, Electrical stimulation, Spinal mobilization, Cryotherapy, Moist heat, Ultrasound, Ionotophoresis '4mg'$ /ml Dexamethasone, Manual therapy, Re-evaluation, and physical performance tests .  PLAN FOR NEXT SESSION:  update HEP to include step strategy activities, standing & gait for balance.      Jamey Reas, PT, DPT 07/19/2022, 1:17 PM

## 2022-07-20 ENCOUNTER — Encounter: Payer: Medicare Other | Admitting: Rehabilitative and Restorative Service Providers"

## 2022-07-24 ENCOUNTER — Encounter: Payer: Self-pay | Admitting: Physical Therapy

## 2022-07-24 ENCOUNTER — Ambulatory Visit (INDEPENDENT_AMBULATORY_CARE_PROVIDER_SITE_OTHER): Payer: Medicare Other | Admitting: Physical Therapy

## 2022-07-24 DIAGNOSIS — R2689 Other abnormalities of gait and mobility: Secondary | ICD-10-CM | POA: Diagnosis not present

## 2022-07-24 DIAGNOSIS — R2681 Unsteadiness on feet: Secondary | ICD-10-CM

## 2022-07-24 DIAGNOSIS — M6281 Muscle weakness (generalized): Secondary | ICD-10-CM

## 2022-07-24 DIAGNOSIS — M5459 Other low back pain: Secondary | ICD-10-CM | POA: Diagnosis not present

## 2022-07-24 DIAGNOSIS — M25561 Pain in right knee: Secondary | ICD-10-CM

## 2022-07-24 NOTE — Therapy (Signed)
OUTPATIENT PHYSICAL THERAPY TREATMENT Patient Name: Katie Weber MRN: [id number] DOB:1945/11/01, 76 y.o., female Today's Date: 07/24/2022  END OF SESSION:  PT End of Session - 07/24/22 1104     Visit Number 14    Number of Visits 25    Date for PT Re-Evaluation 08/30/22    Authorization Type Medicare Part A and B    Progress Note Due on Visit 62    PT Start Time 1108    PT Stop Time 1147    PT Time Calculation (min) 39 min    Activity Tolerance Patient tolerated treatment well    Behavior During Therapy [facility] for tasks assessed/performed                   Past Medical History:  Diagnosis Date   Arthritis    arthritis fingers   Coronary artery calcification 04/24/2017   Diverticulitis    x2 -last 1'17 tx. outpatient.   Environmental and seasonal allergies    2014 Possibly related to air freshners.   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   Family history of anesthesia complication 18HUD ago   daughter had an allergic reaction to atropine   GERD (gastroesophageal reflux disease)    History of hiatal hernia 09/14/2014   History of kidney stones    '80's lithotripsy-[facility] x1   Hyperlipidemia    Hypothyroidism    Pneumonia 2014   Pure hypercholesterolemia    Tremor of left hand    Past Surgical History:  Procedure Laterality Date   BALLOON DILATION N/A 05/04/2014   Procedure: BALLOON DILATION;  Surgeon: Garlan Fair, MD;  Location: [facility];  Service: Endoscopy;  Laterality: N/A;   BREAST LUMPECTOMY Right 1972   benign    BREAST SURGERY Right    '72 -benign tumor   childbirth- NVD x2      CHOLECYSTECTOMY N/A 03/10/2022   Procedure: LAPAROSCOPIC CHOLECYSTECTOMY;  Surgeon: Jovita Kussmaul, MD;  Location: [place];  Service: General;  Laterality: N/A;   COLONOSCOPY WITH PROPOFOL N/A 05/04/2014   Procedure: COLONOSCOPY WITH PROPOFOL;  Surgeon: Garlan Fair, MD;  Location: [facility];   Service: Endoscopy;  Laterality: N/A;   DILATION AND CURETTAGE OF UTERUS     04-20-14 [facility] endometrial polyp removed.   ESOPHAGOGASTRODUODENOSCOPY (EGD) WITH PROPOFOL N/A 05/04/2014   Procedure: ESOPHAGOGASTRODUODENOSCOPY (EGD) WITH PROPOFOL;  Surgeon: Garlan Fair, MD;  Location: [facility];  Service: Endoscopy;  Laterality: N/A;. Procedure done in [place], [place]- 5 yrs ago.   HYSTEROSCOPY WITH D & C N/A 04/20/2014   Procedure: DILATATION AND CURETTAGE /HYSTEROSCOPY;  Surgeon: [name], MD;  Location: [facility];  Service: Gynecology;  Laterality: N/A;   IR PERC CHOLECYSTOSTOMY  01/08/2022   IR RADIOLOGIST EVAL & MGMT  02/10/2022   LAPAROSCOPY N/A 03/14/2022   Procedure: LAPAROSCOPY DIAGNOSTIC  WITH IRRIGATION AND  DRAINAGE OF Lucia Bitter;  Surgeon: Jovita Kussmaul, MD;  Location: [place];  Service: General;  Laterality: N/A;   LITHOTRIPSY     POLYPECTOMY  2020   Dr. Lear Ng   TOTAL KNEE ARTHROPLASTY Left 02/15/2021   Procedure: LEFT TOTAL KNEE ARTHROPLASTY;  Surgeon: Mcarthur Rossetti, MD;  Location: [place];  Service: Orthopedics;  Laterality: Left;   TOTAL KNEE ARTHROPLASTY Right 08/09/2021   Procedure: RIGHT TOTAL KNEE ARTHROPLASTY;  Surgeon: Mcarthur Rossetti, MD;  Location: [place];  Service: Orthopedics;  Laterality: Right;  Needs RNFA   TUBAL LIGATION     Patient Active Problem List   Diagnosis Date Noted   Hypovolemia due to dehydration 03/15/2022   AKI (acute kidney injury) ([place]) 03/15/2022   Biloma following surgery 03/14/2022   Abdominal pain 03/13/2022   Postoperative pain 03/13/2022   Acute cholecystitis 44/81/8563   Biliary colic 14/97/0263   New onset atrial flutter ([place]) 01/07/2022   DJD (degenerative joint disease) of knee 08/09/2021   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   Unilateral primary osteoarthritis, right knee 06/06/2021   Status post total left knee replacement 02/15/2021   Unilateral primary osteoarthritis, left knee  01/04/2021   Coronary artery calcification 04/24/2017   Hyperlipidemia 04/24/2017   Acute bronchitis 09/29/2014   SBO (small bowel obstruction) ([place]) 09/27/2014   Nausea and vomiting 09/27/2014   Hypothyroidism 09/27/2014   Leukocytosis 09/27/2014    PCP: Leeroy Cha, MD  REFERRING PROVIDER: Jovita Kussmaul, MD  REFERRING DIAG: T81.89XA,K66.8 (ICD-10-CM) - Biloma following surgery, initial encounter G89.18 (ICD-10-CM) - Postoperative pain  Rationale for Evaluation and Treatment Rehabilitation  THERAPY DIAG:  Unsteadiness on feet  Other abnormalities of gait and mobility  Muscle weakness (generalized)  Other low back pain  Acute pain of right knee  ONSET DATE: 03/17/22 Referral  SUBJECTIVE:                                                                                                                                                                                           SUBJECTIVE STATEMENT: Blood work showed low Vit D which PCP added Rx dosage.  She did exercises walking with ball and corner on towel roll.  PERTINENT HISTORY:  Cholecystectomy 03/10/22, laparoscopy 03/14/22 TKA Lt 5/22 Rt 11/22, hypothyroidism, hyperlipidemia, OA  PAIN:  NPRS scale:  0.5/10 since last PT lowest 0.5/10 & highest 2/10 Pain location:back upper lumbar & lower thoracic Pain description: dull achy Aggravating factors: standing Relieving factors:  walking & flexing forward  NPRS scale:  3/10 used Voltarin prior to PT, since last PT lowest 2/10 & highest 8-9/10 prior to Voltarin Pain location: Rt anterior-lateral knee and patella Pain description: soreness Aggravating factors: first getting up in the morning or initially moving after knee stiffening, bending Relieving factors: moving  PRECAUTIONS: None  WEIGHT BEARING RESTRICTIONS No  FALLS:  Has patient fallen in last 6 months? No  LIVING ENVIRONMENT: Lives with: lives with their spouse Lives in: House/apartment Stairs:  Yes: Internal: 13 steps; on left going up and External: 2 steps; can reach both Has following equipment at home: Single point cane, Walker - 2 wheeled, Manufacturing engineer  OCCUPATION: retired,  for leisure she enjoys hiking  PLOF: Independent  PATIENT GOALS return to activity level prior to surgery without pain   OBJECTIVE:   PATIENT SURVEYS:  07/17/2022:  FOTO 50% 06/20/2022 FOTO 53% 05/29/2022 FOTO  44%, target 55%  COGNITION: 05/29/2022 Overall cognitive status: Within functional limits for tasks assessed    SENSATION: 05/29/2022 Not tested  POSTURE:  05/29/2022 flexed trunk  and weight shift left  PALPATION: 06/20/2022 - tenderness to palpation at distal IT band, no tenderness at joint line  LUMBAR ROM:   Active AROM  05/29/2022  Flexion   Extension   Right lateral flexion   Left lateral flexion   Right rotation   Left rotation    (Blank rows = not tested)  LOWER EXTREMITY ROM:     ROM (A: AROM, P: PROM) Right Eval 05/29/2022 Left Eval 05/29/2022 Right 06/20/22 Right 07/19/22  Hip flexion      Hip extension      Hip abduction      Hip adduction      Hip internal rotation      Hip external rotation      Knee flexion      Knee extension Seated LAQ A: -8* Seated LAQ WNL Seated  LAQ A: -2* Seated LAQ -2*  Ankle dorsiflexion      Ankle plantarflexion      Ankle inversion      Ankle eversion       (Blank rows = not tested)  LOWER EXTREMITY MMT:    MMT Right Eval 05/29/2022 Left Eval 05/29/2022 Right 06/20/22 Left  06/20/22  Hip flexion      Hip extension      Hip abduction      Hip adduction      Hip internal rotation      Hip external rotation      Knee flexion Seated  4/5 painful Seated  4/5 Seated HH [place] 35.8# & 36.7# RLE:LLE 86.7% Seated HH Dynameter 40.8# & 43.1#  Knee extension Seated  4/5 painful Seated  5/5 Seated HH Dynameter 52.9# & 54.1# RLE:LLE 99.7% Seated HH dynameter 55.9# & 52.4#  Ankle dorsiflexion      Ankle  plantarflexion      Ankle inversion      Ankle eversion       (Blank rows = not tested)  FUNCTIONAL TESTS:  07/17/2022 Functional Gait Assessment: 23/30     06/20/2022 Functional gait assessment: 21/30   05/29/2022 5 times sit to stand: 11.38 sec, no UE use  (cutoff for older adults is <12sec)  Functional gait assessment: 11/30  [facility] PT Assessment - 05/29/22 0001                Functional Gait  Assessment    Gait assessed  Yes     Gait Level Surface Walks 20 ft, slow speed, abnormal gait pattern, evidence for imbalance or deviates 10-15 in outside of the 12 in walkway width. Requires more than 7 sec to ambulate 20 ft.     Change in Gait Speed Makes only minor adjustments to walking speed, or accomplishes a change in speed with significant gait deviations, deviates 10-15 in outside the 12 in walkway width, or changes speed but loses balance but is able to recover and continue walking.     Gait with Horizontal Head Turns Performs head turns with moderate changes in gait velocity, slows down, deviates 10-15 in outside 12 in walkway width but recovers, can continue to walk.  Gait with Vertical Head Turns Performs task with moderate change in gait velocity, slows down, deviates 10-15 in outside 12 in walkway width but recovers, can continue to walk.     Gait and Pivot Turn Pivot turns safely in greater than 3 sec and stops with no loss of balance, or pivot turns safely within 3 sec and stops with mild imbalance, requires small steps to catch balance.     Step Over Obstacle Is able to step over one shoe box (4.5 in total height) but must slow down and adjust steps to clear box safely. May require verbal cueing.     Gait with Narrow Base of Support Ambulates less than 4 steps heel to toe or cannot perform without assistance.     Gait with Eyes Closed Walks 20 ft, slow speed, abnormal gait pattern, evidence for imbalance, deviates 10-15 in outside 12 in walkway width. Requires more than 9 sec  to ambulate 20 ft.     Ambulating Backwards Walks 20 ft, uses assistive device, slower speed, mild gait deviations, deviates 6-10 in outside 12 in walkway width.     Steps Two feet to a stair, must use rail.     Total Score 11        GAIT: 07/17/2022: Pt amb without device  Gait velocity:  comfortable 4.03 ft/sec  fast pace 4.89 ft/sec   06/16/2022:  Independent ambulation into and through clinic between activity.  Less occurrences of lateral instability but still noted on occasion in stance phase SL for both LE.   05/29/2022 Distance walked: 200' Assistive device utilized: None Level of assistance: SBA Comments: shortened step length bil., flexed trunk, some M-L instability and near losses of balance  TODAY'S TREATMENT  07/24/2022 Neuromuscular Re-education: Standing hip width apart:  eyes open - head motions side to side, progressed to looking to side with shoulder rotation, then progressed to look behind with weight shift / hip rotation;   up / down head & eyes, added shoulders, added low back.   Eyes closed on floor hip width: head 4 directions Worked on step strategy:  -anterior, posterior & lateral anticipatory stepping off towel roll;   -Progressed to anticipatory / reactionary with reaching outside base until balance loss; -Reactionary with sudden release of resistance.  [place] stepping [place].  PT educated on performing at home with back stepping towards counter for safety  pt verbalized understanding.     07/19/2022 Gait Training: Ambulating while tossing tennis ball between hands, tossing ball upward, tapping LUE on leg then singing song in her head.  She improved her LUE tremors.   Worked on sudden stops, then PT bumping pt from side, back & forward with occasional minA to catch balance.    Neuromuscular Re-education: Tandem stance on foam beam 30 sec RLE in front & in back.   Tandem stance on floor tossing ball 5' apart.  Worked on step strategy anterior,  posterior & lateral reactionary with sudden release of resistance. Added as HEP in corner standing on towel roll anticipatory stepping forward, backward & to sides.  Pt return demo & verbalized understanding.     07/17/2022 Therapeutic Activities: PT verbally reviewed sit/stand, getting in/out of high bed & supine/sit. Pt verbalizes understanding & notes that it is helping. PT demo & verbal cues on [place] to work on trigger points that pt reported over weekend.  She used our [employer] on her upper Trap & posterior neck. She verbalized helped & understands use.  PT demo & verbal cues on using 4" blocks under feet sitting in chair which helps support feet & sit back in chair.  With her height she reports significant improvement in comfort.  PT recommended light weight item that can be slid in/out of way easily. Pt verbalized & return demo understanding. Working on upright posture:  1) standing with posterior pelvis to counter with hands on counter, pushing upward with BLEs & trunk. PT recommended 2x/day for 2+ minutes.  2)standing with back to door frame with single UE & BUE overhead reach hold for 2 deep breaths with recommendation when exiting bathroom for 4+ x/day.    Pt verbalized & return demo understanding of both exercises.   Gait: see objective for testing & functional outcomes.    Access Code: [email or web address] URL: [email or web address] Date: 07/13/2022 Prepared by: Jamey Reas  Exercises - Hooklying Single Knee to Chest Stretch  - 2-4 x daily - 7 x weekly - 1 sets - 3 reps - 15 seconds hold - Supine Double Knee to Chest Modified  - 2-4 x daily - 7 x weekly - 1 sets - 3 reps - 15 seconds hold - Supine Hamstring Stretch  - 2-4 x daily - 7 x weekly - 1 sets - 3 reps - 15 seconds hold - Supine Lower Trunk Rotation  - 2-4 x daily - 7 x weekly - 1 sets - 3 reps - 15 seconds hold - Seated Pelvic Tilt  - 2-4 x daily - 7 x weekly - 1-3 sets - 10 reps - 5 seconds hold  -single  knee to chest stretch 15 sec or 3-5 deep breathes for hold 3 reps ea LE  -hamstring stretch - towel under thigh may be easier - knee to chest then push heel towards ceiling. 15 sec or 3-5 deep breathes for hold 3 reps ea LE  -double knee to chest stretch (bringing one knee up & down at time) 15 sec or 3-5 deep breathes for hold 3 reps.   -double hamstring - bring knees to chest one at time, push one heel towards ceiling for single leg stretch return to both knees to chest, repeat with other leg;  then push one leg at time toward ceiling until both legs are up, lower feet bringing knees to chest one at a time,  then lower both legs to bed one at a time.  15 sec or 3-5 deep breathes for hold 3 reps ea LE  -trunk rotation stretch supine 15 sec or 3-5 deep breathes for hold 3 reps ea direction  -seated lumbar lordosis arching & flexion flattening back 5 sec hold 10 reps ea.  Feet on floor with hands on thighs.  Breath with hold both directions.    PATIENT EDUCATION:  06/01/2022 Education details: HEP Person educated: Patient Education method: Explanation, cues, handout Education comprehension: verbalized understanding, demonstration  HOME EXERCISE PROGRAM: Access Code: [email or web address] URL: [email or web address] Date: 06/06/2022 Prepared by: Jamey Reas  Exercises - Sit to Stand  - 3 x daily - 7 x weekly - 1 sets - 10 reps - Seated Straight Leg Heel Taps  - 1-2 x daily - 7 x weekly - 1-2 sets - 10 reps - Tandem Stance  - 1-2 x daily - 7 x weekly - 1 sets - 1-2 reps - 30- 60 sec hold - [place] with Counter Support  - 1-2 x daily - 7 x weekly - 1 sets - 10 reps - Standing hip extension alternating legs  - 1-2 x  daily - 7 x weekly - 1 sets - 10 reps - 5 seconds hold - Standing hip abduction alternating legs  - 1-2 x daily - 7 x weekly - 1 sets - 10 reps - 5 seconds hold - Standing March with Counter Support  - 1-2 x daily - 7 x weekly - 1 sets - 10 reps - 5 seconds  hold  ASSESSMENT:  CLINICAL IMPRESSION: PT worked on step strategy all directions, progressive amount of body motion for scanning / looking and balance reactions.  She improved with repetition and PT intervention.  She appears to understand how to safely perform as HEP.    OBJECTIVE IMPAIRMENTS Abnormal gait, decreased activity tolerance, decreased balance, decreased endurance, decreased mobility, difficulty walking, decreased ROM, decreased strength, postural dysfunction, and pain.   ACTIVITY LIMITATIONS carrying, lifting, bending, squatting, stairs, toileting, dressing, locomotion level, and caring for others  PARTICIPATION LIMITATIONS: meal prep, cleaning, laundry, driving, community activity, and yard work  [place], Time since onset of injury/illness/exacerbation, and 3+ comorbidities: see PMH  are also affecting patient's functional outcome.   REHAB POTENTIAL: Good  CLINICAL DECISION MAKING: Evolving/moderate complexity  EVALUATION COMPLEXITY: Low   GOALS: Goals reviewed with patient? Yes  SHORT TERM GOALS: Target date: 08/10/22  Patient will be independent with updated HEP to continue progressing outside of clinic. Baseline: see objective data Goal status: ongoing 07/19/2022  2.  Patient able to recover balance within 1 step for step strategy testing anterior, posterior & laterally. Baseline: see objective data Goal status: set 07/19/2022   LONG TERM GOALS: Target date: 07/21/22  Patient will score FOTO >/= 55% to reflect improved self-perceived functional ability. Baseline: see objective data Goal status: on going - assessed 07/17/2022   2.  Patient scores above a 25/30 on the FGA to represent low fall risk with gait activities. Baseline: see objective data Goal status: on going - assessed 07/17/2022  3.  Patient reports </= 2/10 back and knee pain with standing, gait, and stair activity. Baseline: see objective data Goal status: on going -  assessed 07/17/2022  4.  Patient Rt knee seated LAQ to -2* to represent improved knee range and quadriceps strength. Baseline: see objective data Goal status: ongoing assessed 07/17/2022  5.  Patient safely ambulates 500' including ramp, curb, and stairs independently to allow for household and community mobility. Baseline: see objective data Goal status: on going - assessed 06/29/2022  6.  Patient verbalizes and demonstrates ability to perform ADLs, household chores, and yardwork. Baseline: see objective data Goal status: on going - assessed 06/29/2022  UPDATED LONG TERM GOALS: Target date: 08/30/22  Patient will score FOTO >/= 55% to reflect improved self-perceived functional ability. Baseline: see objective data Goal status: on going - assessed 07/17/2022   2.  Patient scores above a 25/30 on the FGA to represent low fall risk with gait activities. Baseline: see objective data Goal status: on going - assessed 07/17/2022  3.  Patient reports </= 2/10 back and knee pain with standing, gait, and stair activity. Baseline: see objective data Goal status: on going - assessed 07/17/2022  4.  Patient Rt knee seated LAQ to -2* to represent improved knee range and quadriceps strength. Baseline: see objective data Goal status: ongoing assessed 07/17/2022  5.  Patient safely ambulates 500' including ramp, curb, and stairs independently to allow for household and community mobility. Baseline: see objective data Goal status: on going - assessed 06/29/2022  6.  Patient verbalizes and demonstrates ability to perform  ADLs, household chores, and yardwork. Baseline: see objective data Goal status: on going - assessed 06/29/2022  PLAN: PT FREQUENCY: 2x/week  PT DURATION: 6 weeks  PLANNED INTERVENTIONS: Therapeutic exercises, Therapeutic activity, Neuromuscular re-education, Balance training, Gait training, Patient/Family education, Self Care, Joint mobilization, Stair training, Vestibular training,  DME instructions, Dry Needling, Electrical stimulation, Spinal mobilization, Cryotherapy, Moist heat, Ultrasound, Ionotophoresis '4mg'$ /ml Dexamethasone, Manual therapy, Re-evaluation, and physical performance tests .  PLAN FOR NEXT SESSION:  work on balance reactions including step strategy activities, standing & gait for balance.      Jamey Reas, PT, DPT 07/24/2022, 12:17 PM

## 2022-07-25 ENCOUNTER — Encounter: Payer: Medicare Other | Admitting: Rehabilitative and Restorative Service Providers"

## 2022-07-26 ENCOUNTER — Encounter: Payer: Self-pay | Admitting: Physical Therapy

## 2022-07-26 ENCOUNTER — Ambulatory Visit (INDEPENDENT_AMBULATORY_CARE_PROVIDER_SITE_OTHER): Payer: Medicare Other | Admitting: Physical Therapy

## 2022-07-26 DIAGNOSIS — R2681 Unsteadiness on feet: Secondary | ICD-10-CM

## 2022-07-26 DIAGNOSIS — M25661 Stiffness of right knee, not elsewhere classified: Secondary | ICD-10-CM

## 2022-07-26 DIAGNOSIS — M6281 Muscle weakness (generalized): Secondary | ICD-10-CM | POA: Diagnosis not present

## 2022-07-26 DIAGNOSIS — R2689 Other abnormalities of gait and mobility: Secondary | ICD-10-CM | POA: Diagnosis not present

## 2022-07-26 DIAGNOSIS — M5459 Other low back pain: Secondary | ICD-10-CM

## 2022-07-26 DIAGNOSIS — M25561 Pain in right knee: Secondary | ICD-10-CM

## 2022-07-26 DIAGNOSIS — R600 Localized edema: Secondary | ICD-10-CM

## 2022-07-26 NOTE — Therapy (Signed)
OUTPATIENT PHYSICAL THERAPY TREATMENT Patient Name: Katie Weber MRN: [id number] DOB:06-26-1946, 76 y.o., female Today's Date: 07/26/2022  END OF SESSION:  PT End of Session - 07/26/22 1056     Visit Number 15    Number of Visits 25    Date for PT Re-Evaluation 08/30/22    Authorization Type Medicare Part A and B    Progress Note Due on Visit 17    PT Start Time 1100    PT Stop Time 1145    PT Time Calculation (min) 45 min    Activity Tolerance Patient tolerated treatment well    Behavior During Therapy [facility] for tasks assessed/performed                    Past Medical History:  Diagnosis Date   Arthritis    arthritis fingers   Coronary artery calcification 04/24/2017   Diverticulitis    x2 -last 1'17 tx. outpatient.   Environmental and seasonal allergies    2014 Possibly related to air freshners.   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   Family history of anesthesia complication 12WPY ago   daughter had an allergic reaction to atropine   GERD (gastroesophageal reflux disease)    History of hiatal hernia 09/14/2014   History of kidney stones    '80's lithotripsy-[facility] x1   Hyperlipidemia    Hypothyroidism    Pneumonia 2014   Pure hypercholesterolemia    Tremor of left hand    Past Surgical History:  Procedure Laterality Date   BALLOON DILATION N/A 05/04/2014   Procedure: BALLOON DILATION;  Surgeon: Garlan Fair, MD;  Location: [facility];  Service: Endoscopy;  Laterality: N/A;   BREAST LUMPECTOMY Right 1972   benign    BREAST SURGERY Right    '72 -benign tumor   childbirth- NVD x2      CHOLECYSTECTOMY N/A 03/10/2022   Procedure: LAPAROSCOPIC CHOLECYSTECTOMY;  Surgeon: Jovita Kussmaul, MD;  Location: [place];  Service: General;  Laterality: N/A;   COLONOSCOPY WITH PROPOFOL N/A 05/04/2014   Procedure: COLONOSCOPY WITH PROPOFOL;  Surgeon: Garlan Fair, MD;  Location: [facility];   Service: Endoscopy;  Laterality: N/A;   DILATION AND CURETTAGE OF UTERUS     04-20-14 [facility] endometrial polyp removed.   ESOPHAGOGASTRODUODENOSCOPY (EGD) WITH PROPOFOL N/A 05/04/2014   Procedure: ESOPHAGOGASTRODUODENOSCOPY (EGD) WITH PROPOFOL;  Surgeon: Garlan Fair, MD;  Location: [facility];  Service: Endoscopy;  Laterality: N/A;. Procedure done in [place], [place]- 5 yrs ago.   HYSTEROSCOPY WITH D & C N/A 04/20/2014   Procedure: DILATATION AND CURETTAGE /HYSTEROSCOPY;  Surgeon: [name], MD;  Location: [place] [facility];  Service: Gynecology;  Laterality: N/A;   IR PERC CHOLECYSTOSTOMY  01/08/2022   IR RADIOLOGIST EVAL & MGMT  02/10/2022   LAPAROSCOPY N/A 03/14/2022   Procedure: LAPAROSCOPY DIAGNOSTIC  WITH IRRIGATION AND  DRAINAGE OF Lucia Bitter;  Surgeon: Jovita Kussmaul, MD;  Location: [place];  Service: General;  Laterality: N/A;   LITHOTRIPSY     POLYPECTOMY  2020   Dr. Lear Ng   TOTAL KNEE ARTHROPLASTY Left 02/15/2021   Procedure: LEFT TOTAL KNEE ARTHROPLASTY;  Surgeon: Mcarthur Rossetti, MD;  Location: [place];  Service: Orthopedics;  Laterality: Left;   TOTAL KNEE ARTHROPLASTY Right 08/09/2021   Procedure: RIGHT TOTAL KNEE ARTHROPLASTY;  Surgeon: Mcarthur Rossetti, MD;  Location: [place];  Service: Orthopedics;  Laterality:  Right;  Needs RNFA   TUBAL LIGATION     Patient Active Problem List   Diagnosis Date Noted   Hypovolemia due to dehydration 03/15/2022   AKI (acute kidney injury) ([place]) 03/15/2022   Biloma following surgery 03/14/2022   Abdominal pain 03/13/2022   Postoperative pain 03/13/2022   Acute cholecystitis 78/29/5621   Biliary colic 30/86/5784   New onset atrial flutter ([place]) 01/07/2022   DJD (degenerative joint disease) of knee 08/09/2021   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   Unilateral primary osteoarthritis, right knee 06/06/2021   Status post total left knee replacement 02/15/2021   Unilateral primary osteoarthritis, left  knee 01/04/2021   Coronary artery calcification 04/24/2017   Hyperlipidemia 04/24/2017   Acute bronchitis 09/29/2014   SBO (small bowel obstruction) ([place]) 09/27/2014   Nausea and vomiting 09/27/2014   Hypothyroidism 09/27/2014   Leukocytosis 09/27/2014    PCP: Leeroy Cha, MD  REFERRING PROVIDER: Jovita Kussmaul, MD  REFERRING DIAG: T81.89XA,K66.8 (ICD-10-CM) - Biloma following surgery, initial encounter G89.18 (ICD-10-CM) - Postoperative pain  Rationale for Evaluation and Treatment Rehabilitation  THERAPY DIAG:  Unsteadiness on feet  Other abnormalities of gait and mobility  Muscle weakness (generalized)  Other low back pain  Acute pain of right knee  Stiffness of right knee, not elsewhere classified  Localized edema  ONSET DATE: 03/17/22 Referral  SUBJECTIVE:                                                                                                                                                                                           SUBJECTIVE STATEMENT: Her back has been feeling a lot better with exercises especially the door frame reaching overhead.  She is using good form for sit/stand and getting in/out bed.    PERTINENT HISTORY:  Cholecystectomy 03/10/22, laparoscopy 03/14/22 TKA Lt 5/22 Rt 11/22, hypothyroidism, hyperlipidemia, OA  PAIN:  NPRS scale:  0.5/10 since last PT lowest 0.5/10 & highest 2/10 Pain location:back upper lumbar & lower thoracic Pain description: dull achy Aggravating factors: standing Relieving factors:  walking & flexing forward  NPRS scale:  1/10, since last PT lowest 0/10 & highest 4/10 Pain location: Rt anterior-lateral knee and patella Pain description: soreness Aggravating factors: first getting up in the morning or initially moving after knee stiffening, bending Relieving factors: moving  PRECAUTIONS: None  WEIGHT BEARING RESTRICTIONS No  FALLS:  Has patient fallen in last 6 months? No  LIVING  ENVIRONMENT: Lives with: lives with their spouse Lives in: House/apartment Stairs: Yes: Internal: 13 steps; on left going up and External: 2 steps; can reach both Has following equipment at home: Single  point cane, Walker - 2 wheeled, Manufacturing engineer  OCCUPATION: retired, for leisure she enjoys hiking  PLOF: [place] return to activity level prior to surgery without pain   OBJECTIVE:   PATIENT SURVEYS:  07/17/2022:  FOTO 50% 06/20/2022 FOTO 53% 05/29/2022 FOTO  44%, target 55%  COGNITION: 05/29/2022 Overall cognitive status: Within functional limits for tasks assessed    SENSATION: 05/29/2022 Not tested  POSTURE:  05/29/2022 flexed trunk  and weight shift left  PALPATION: 06/20/2022 - tenderness to palpation at distal IT band, no tenderness at joint line  LUMBAR ROM:   Active AROM  05/29/2022  Flexion   Extension   Right lateral flexion   Left lateral flexion   Right rotation   Left rotation    (Blank rows = not tested)  LOWER EXTREMITY ROM:     ROM (A: AROM, P: PROM) Right Eval 05/29/2022 Left Eval 05/29/2022 Right 06/20/22 Right 07/19/22  Hip flexion      Hip extension      Hip abduction      Hip adduction      Hip internal rotation      Hip external rotation      Knee flexion      Knee extension Seated LAQ A: -8* Seated LAQ WNL Seated  LAQ A: -2* Seated LAQ -2*  Ankle dorsiflexion      Ankle plantarflexion      Ankle inversion      Ankle eversion       (Blank rows = not tested)  LOWER EXTREMITY MMT:    MMT Right Eval 05/29/2022 Left Eval 05/29/2022 Right 06/20/22 Left  06/20/22  Hip flexion      Hip extension      Hip abduction      Hip adduction      Hip internal rotation      Hip external rotation      Knee flexion Seated  4/5 painful Seated  4/5 Seated HH Dynameter 35.8# & 36.7# RLE:LLE 86.7% Seated HH Dynameter 40.8# & 43.1#  Knee extension Seated  4/5 painful Seated  5/5 Seated HH Dynameter 52.9# &  54.1# RLE:LLE 99.7% Seated HH dynameter 55.9# & 52.4#  Ankle dorsiflexion      Ankle plantarflexion      Ankle inversion      Ankle eversion       (Blank rows = not tested)  FUNCTIONAL TESTS:  07/17/2022 Functional Gait Assessment: 23/30     06/20/2022 Functional gait assessment: 21/30   05/29/2022 5 times sit to stand: 11.38 sec, no UE use  (cutoff for older adults is <12sec)  Functional gait assessment: 11/30  [facility] PT Assessment - 05/29/22 0001                Functional Gait  Assessment    Gait assessed  Yes     Gait Level Surface Walks 20 ft, slow speed, abnormal gait pattern, evidence for imbalance or deviates 10-15 in outside of the 12 in walkway width. Requires more than 7 sec to ambulate 20 ft.     Change in Gait Speed Makes only minor adjustments to walking speed, or accomplishes a change in speed with significant gait deviations, deviates 10-15 in outside the 12 in walkway width, or changes speed but loses balance but is able to recover and continue walking.     Gait with Horizontal Head Turns Performs head turns with moderate changes in gait velocity, slows down, deviates 10-15 in outside 12  in walkway width but recovers, can continue to walk.     Gait with Vertical Head Turns Performs task with moderate change in gait velocity, slows down, deviates 10-15 in outside 12 in walkway width but recovers, can continue to walk.     Gait and Pivot Turn Pivot turns safely in greater than 3 sec and stops with no loss of balance, or pivot turns safely within 3 sec and stops with mild imbalance, requires small steps to catch balance.     Step Over Obstacle Is able to step over one shoe box (4.5 in total height) but must slow down and adjust steps to clear box safely. May require verbal cueing.     Gait with Narrow Base of Support Ambulates less than 4 steps heel to toe or cannot perform without assistance.     Gait with Eyes Closed Walks 20 ft, slow speed, abnormal gait pattern,  evidence for imbalance, deviates 10-15 in outside 12 in walkway width. Requires more than 9 sec to ambulate 20 ft.     Ambulating Backwards Walks 20 ft, uses assistive device, slower speed, mild gait deviations, deviates 6-10 in outside 12 in walkway width.     Steps Two feet to a stair, must use rail.     Total Score 11        GAIT: 07/17/2022: Pt amb without device  Gait velocity:  comfortable 4.03 ft/sec  fast pace 4.89 ft/sec   06/16/2022:  Independent ambulation into and through clinic between activity.  Less occurrences of lateral instability but still noted on occasion in stance phase SL for both LE.   05/29/2022 Distance walked: 200' Assistive device utilized: None Level of assistance: SBA Comments: shortened step length bil., flexed trunk, some M-L instability and near losses of balance  TODAY'S TREATMENT  07/26/2022 Therapeutic Exercise: Seated hamstring stretch 30 sec hold 2 reps with PT cues on technique Seated LAQ RLE 10 reps 5 sec hold,  added red theraband LAQ 10 reps Sit to/from stand without UEs 10 reps Hamstring curl blue theraband 10 reps Standing Terminal knee ext blue theraband 10 reps;  progressed to TKE with single limb stance Standing gastroc stretch 30 sec hold 2 reps ea LE Standing quad stretch with towel 30 sec hold 2 reps RLE Pt descend stairs 11 + 11 + 6steps with 2 rails alternating pattern.  Discussed doing with 2 rails then contralateral rails for functional strength.   PT instructed in well-rounded HEP to include flexibility, strength, balance & endurance. Pt verbalized understanding.    07/24/2022 Neuromuscular Re-education: Standing hip width apart:  eyes open - head motions side to side, progressed to looking to side with shoulder rotation, then progressed to look behind with weight shift / hip rotation;   up / down head & eyes, added shoulders, added low back.   Eyes closed on floor hip width: head 4 directions Worked on step strategy:   -anterior, posterior & lateral anticipatory stepping off towel roll;   -Progressed to anticipatory / reactionary with reaching outside base until balance loss; -Reactionary with sudden release of resistance.  [place] stepping [place].  PT educated on performing at home with back stepping towards counter for safety  pt verbalized understanding.     07/19/2022 Gait Training: Ambulating while tossing tennis ball between hands, tossing ball upward, tapping LUE on leg then singing song in her head.  She improved her LUE tremors.   Worked on sudden stops, then PT bumping pt from side,  back & forward with occasional minA to catch balance.    Neuromuscular Re-education: Tandem stance on foam beam 30 sec RLE in front & in back.   Tandem stance on floor tossing ball 5' apart.  Worked on step strategy anterior, posterior & lateral reactionary with sudden release of resistance. Added as HEP in corner standing on towel roll anticipatory stepping forward, backward & to sides.  Pt return demo & verbalized understanding.     HOME EXERCISE PROGRAM: Access Code: [email or web address] URL: [email or web address] Date: 07/26/2022 Prepared by: Jamey Reas  Exercises - Seated Hamstring Stretch  - 1 x daily - 7 x weekly - 1 sets - 3 reps - 30 seconds hold - Seated Long Arc Quad  - 1 x daily - 5 x weekly - 1 sets - 10 reps - 5 seconds hold - Sitting Knee Extension with Resistance  - 1 x daily - 3 x weekly - 1 sets - 10 reps - 5 seconds hold - Sit to Stand  - 1 x daily - 3 x weekly - 1 sets - 10 reps - 5 seconds hold - Seated Hamstring Curl with Anchored Resistance  - 1 x daily - 3 x weekly - 1 sets - 10 reps - 5 seconds hold - Standing Terminal Knee Extension with Resistance  - 1 x daily - 3 x weekly - 1 sets - 10 reps - 5 seconds hold - Single Leg Stance  - 1 x daily - 3 x weekly - 1 sets - 10 reps - 5 seconds hold - Gastroc Stretch on Step  - 1 x daily - 7 x weekly - 1 sets - 3 reps - 30 seconds  hold - Standing Quad Stretch with Strap  - 1 x daily - 7 x weekly - 1 sets - 3 reps - 30 seconds hold   Access Code: [email or web address] URL: [email or web address] Date: 07/13/2022 Prepared by: Jamey Reas  Exercises - Hooklying Single Knee to Chest Stretch  - 2-4 x daily - 7 x weekly - 1 sets - 3 reps - 15 seconds hold - Supine Double Knee to Chest Modified  - 2-4 x daily - 7 x weekly - 1 sets - 3 reps - 15 seconds hold - Supine Hamstring Stretch  - 2-4 x daily - 7 x weekly - 1 sets - 3 reps - 15 seconds hold - Supine Lower Trunk Rotation  - 2-4 x daily - 7 x weekly - 1 sets - 3 reps - 15 seconds hold - Seated Pelvic Tilt  - 2-4 x daily - 7 x weekly - 1-3 sets - 10 reps - 5 seconds hold   Access Code: [email or web address] URL: [email or web address] Date: 06/06/2022 Prepared by: Jamey Reas  Exercises - Sit to Stand  - 3 x daily - 7 x weekly - 1 sets - 10 reps - Seated Straight Leg Heel Taps  - 1-2 x daily - 7 x weekly - 1-2 sets - 10 reps - Tandem Stance  - 1-2 x daily - 7 x weekly - 1 sets - 1-2 reps - 30- 60 sec hold - [place] with Counter Support  - 1-2 x daily - 7 x weekly - 1 sets - 10 reps - Standing hip extension alternating legs  - 1-2 x daily - 7 x weekly - 1 sets - 10 reps - 5 seconds hold - Standing hip abduction alternating legs  - 1-2 x daily - 7 x weekly - 1 sets - 10 reps - 5 seconds hold -  Standing March with Counter Support  - 1-2 x daily - 7 x weekly - 1 sets - 10 reps - 5 seconds hold  ASSESSMENT:  CLINICAL IMPRESSION: PT updated knee HEP for flexibility & strengthening which she appears to understand.  Pt reports her back & knee are starting to feel better.    OBJECTIVE IMPAIRMENTS Abnormal gait, decreased activity tolerance, decreased balance, decreased endurance, decreased mobility, difficulty walking, decreased ROM, decreased strength, postural dysfunction, and pain.   ACTIVITY LIMITATIONS carrying, lifting, bending, squatting, stairs, toileting,  dressing, locomotion level, and caring for others  PARTICIPATION LIMITATIONS: meal prep, cleaning, laundry, driving, community activity, and yard work  [place], Time since onset of injury/illness/exacerbation, and 3+ comorbidities: see PMH  are also affecting patient's functional outcome.   REHAB POTENTIAL: Good  CLINICAL DECISION MAKING: Evolving/moderate complexity  EVALUATION COMPLEXITY: Low   GOALS: Goals reviewed with patient? Yes  SHORT TERM GOALS: Target date: 08/10/22  Patient will be independent with updated HEP to continue progressing outside of clinic. Baseline: see objective data Goal status: ongoing 07/19/2022  2.  Patient able to recover balance within 1 step for step strategy testing anterior, posterior & laterally. Baseline: see objective data Goal status: set 07/19/2022   UPDATED LONG TERM GOALS: Target date: 08/30/22  Patient will score FOTO >/= 55% to reflect improved self-perceived functional ability. Baseline: see objective data Goal status: on going - assessed 07/17/2022   2.  Patient scores above a 25/30 on the FGA to represent low fall risk with gait activities. Baseline: see objective data Goal status: on going - assessed 07/17/2022  3.  Patient reports </= 2/10 back and knee pain with standing, gait, and stair activity. Baseline: see objective data Goal status: on going - assessed 07/17/2022  4.  Patient Rt knee seated LAQ to -2* to represent improved knee range and quadriceps strength. Baseline: see objective data Goal status: ongoing assessed 07/17/2022  5.  Patient safely ambulates 500' including ramp, curb, and stairs independently to allow for household and community mobility. Baseline: see objective data Goal status: on going - assessed 06/29/2022  6.  Patient verbalizes and demonstrates ability to perform ADLs, household chores, and yardwork. Baseline: see objective data Goal status: on going - assessed  06/29/2022  PLAN: PT FREQUENCY: 2x/week  PT DURATION: 6 weeks  PLANNED INTERVENTIONS: Therapeutic exercises, Therapeutic activity, Neuromuscular re-education, Balance training, Gait training, Patient/Family education, Self Care, Joint mobilization, Stair training, Vestibular training, DME instructions, Dry Needling, Electrical stimulation, Spinal mobilization, Cryotherapy, Moist heat, Ultrasound, Ionotophoresis '4mg'$ /ml Dexamethasone, Manual therapy, Re-evaluation, and physical performance tests .  PLAN FOR NEXT SESSION:  work towards [place].   work on balance reactions including step strategy activities, standing & gait for balance.      Jamey Reas, PT, DPT 07/26/2022, 1:25 PM

## 2022-07-31 ENCOUNTER — Encounter: Payer: Self-pay | Admitting: Rehabilitative and Restorative Service Providers"

## 2022-07-31 ENCOUNTER — Ambulatory Visit (INDEPENDENT_AMBULATORY_CARE_PROVIDER_SITE_OTHER): Payer: Medicare Other | Admitting: Rehabilitative and Restorative Service Providers"

## 2022-07-31 DIAGNOSIS — R2681 Unsteadiness on feet: Secondary | ICD-10-CM | POA: Diagnosis not present

## 2022-07-31 DIAGNOSIS — R2689 Other abnormalities of gait and mobility: Secondary | ICD-10-CM

## 2022-07-31 DIAGNOSIS — M25661 Stiffness of right knee, not elsewhere classified: Secondary | ICD-10-CM

## 2022-07-31 DIAGNOSIS — M5459 Other low back pain: Secondary | ICD-10-CM

## 2022-07-31 DIAGNOSIS — M25561 Pain in right knee: Secondary | ICD-10-CM

## 2022-07-31 DIAGNOSIS — M6281 Muscle weakness (generalized): Secondary | ICD-10-CM

## 2022-07-31 NOTE — Therapy (Signed)
OUTPATIENT PHYSICAL THERAPY TREATMENT Patient Name: Katie Weber MRN: [id number] DOB:27-Nov-1945, 76 y.o., female Today's Date: 07/31/2022  END OF SESSION:  PT End of Session - 07/31/22 1253     Visit Number 16    Number of Visits 25    Date for PT Re-Evaluation 08/30/22    Authorization Type Medicare Part A and B    Progress Note Due on Visit 72    PT Start Time 1257    PT Stop Time 1337    PT Time Calculation (min) 40 min    Activity Tolerance Patient tolerated treatment well    Behavior During Therapy [facility] for tasks assessed/performed              Past Medical History:  Diagnosis Date   Arthritis    arthritis fingers   Coronary artery calcification 04/24/2017   Diverticulitis    x2 -last 1'17 tx. outpatient.   Environmental and seasonal allergies    2014 Possibly related to air freshners.   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   Family history of anesthesia complication 02MVV ago   daughter had an allergic reaction to atropine   GERD (gastroesophageal reflux disease)    History of hiatal hernia 09/14/2014   History of kidney stones    '80's lithotripsy-[facility] x1   Hyperlipidemia    Hypothyroidism    Pneumonia 2014   Pure hypercholesterolemia    Tremor of left hand    Past Surgical History:  Procedure Laterality Date   BALLOON DILATION N/A 05/04/2014   Procedure: BALLOON DILATION;  Surgeon: Garlan Fair, MD;  Location: [facility];  Service: Endoscopy;  Laterality: N/A;   BREAST LUMPECTOMY Right 1972   benign    BREAST SURGERY Right    '72 -benign tumor   childbirth- NVD x2      CHOLECYSTECTOMY N/A 03/10/2022   Procedure: LAPAROSCOPIC CHOLECYSTECTOMY;  Surgeon: Jovita Kussmaul, MD;  Location: [facility];  Service: General;  Laterality: N/A;   COLONOSCOPY WITH PROPOFOL N/A 05/04/2014   Procedure: COLONOSCOPY WITH PROPOFOL;  Surgeon: Garlan Fair, MD;  Location: [facility];  Service:  Endoscopy;  Laterality: N/A;   DILATION AND CURETTAGE OF UTERUS     04-20-14 [facility] hospital endometrial polyp removed.   ESOPHAGOGASTRODUODENOSCOPY (EGD) WITH PROPOFOL N/A 05/04/2014   Procedure: ESOPHAGOGASTRODUODENOSCOPY (EGD) WITH PROPOFOL;  Surgeon: Garlan Fair, MD;  Location: [facility];  Service: Endoscopy;  Laterality: N/A;. Procedure done in [place], [place]- 5 yrs ago.   HYSTEROSCOPY WITH D & C N/A 04/20/2014   Procedure: DILATATION AND CURETTAGE /HYSTEROSCOPY;  Surgeon: [name], MD;  Location: [facility];  Service: Gynecology;  Laterality: N/A;   IR PERC CHOLECYSTOSTOMY  01/08/2022   IR RADIOLOGIST EVAL & MGMT  02/10/2022   LAPAROSCOPY N/A 03/14/2022   Procedure: LAPAROSCOPY DIAGNOSTIC  WITH IRRIGATION AND  DRAINAGE OF Lucia Bitter;  Surgeon: Jovita Kussmaul, MD;  Location: [facility];  Service: General;  Laterality: N/A;   LITHOTRIPSY     POLYPECTOMY  2020   Dr. Lear Ng   TOTAL KNEE ARTHROPLASTY Left 02/15/2021   Procedure: LEFT TOTAL KNEE ARTHROPLASTY;  Surgeon: Mcarthur Rossetti, MD;  Location: [facility];  Service: Orthopedics;  Laterality: Left;   TOTAL KNEE ARTHROPLASTY Right 08/09/2021   Procedure: RIGHT TOTAL KNEE ARTHROPLASTY;  Surgeon: Mcarthur Rossetti, MD;  Location: [facility];  Service: Orthopedics;  Laterality: Right;  Needs RNFA  TUBAL LIGATION     Patient Active Problem List   Diagnosis Date Noted   Hypovolemia due to dehydration 03/15/2022   AKI (acute kidney injury) ([place]) 03/15/2022   Biloma following surgery 03/14/2022   Abdominal pain 03/13/2022   Postoperative pain 03/13/2022   Acute cholecystitis 31/49/7026   Biliary colic 37/85/8850   New onset atrial flutter ([place]) 01/07/2022   DJD (degenerative joint disease) of knee 08/09/2021   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   Unilateral primary osteoarthritis, right knee 06/06/2021   Status post total left knee replacement 02/15/2021   Unilateral primary osteoarthritis, left knee  01/04/2021   Coronary artery calcification 04/24/2017   Hyperlipidemia 04/24/2017   Acute bronchitis 09/29/2014   SBO (small bowel obstruction) ([place]) 09/27/2014   Nausea and vomiting 09/27/2014   Hypothyroidism 09/27/2014   Leukocytosis 09/27/2014    PCP: Leeroy Cha, MD  REFERRING PROVIDER: Jovita Kussmaul, MD  REFERRING DIAG: T81.89XA,K66.8 (ICD-10-CM) - Biloma following surgery, initial encounter G89.18 (ICD-10-CM) - Postoperative pain  Rationale for Evaluation and Treatment Rehabilitation  THERAPY DIAG:  Unsteadiness on feet  Other abnormalities of gait and mobility  Muscle weakness (generalized)  Other low back pain  Acute pain of right knee  Stiffness of right knee, not elsewhere classified  ONSET DATE: 03/17/22 Referral  SUBJECTIVE:                                                                                                                                                                                           SUBJECTIVE STATEMENT: Pt indicated no knee pain, a little bit of back ache after grandkid care over the weekend.    PERTINENT HISTORY:  Cholecystectomy 03/10/22, laparoscopy 03/14/22 TKA Lt 5/22 Rt 11/22, hypothyroidism, hyperlipidemia, OA  PAIN:  NPRS scale: 0/10  Pain location: knee pain Pain description:  Aggravating factors: Relieving factors:   PRECAUTIONS: None  WEIGHT BEARING RESTRICTIONS No  FALLS:  Has patient fallen in last 6 months? No  LIVING ENVIRONMENT: Lives with: lives with their spouse Lives in: House/apartment Stairs: Yes: Internal: 13 steps; on left going up and External: 2 steps; can reach both Has following equipment at home: Single point cane, Walker - 2 wheeled, Manufacturing engineer  OCCUPATION: retired, for leisure she enjoys hiking  PLOF: [place] return to activity level prior to surgery without pain   OBJECTIVE:   PATIENT SURVEYS:  07/17/2022:  FOTO 50% 06/20/2022 FOTO 53% 05/29/2022  FOTO  44%, target 55%  COGNITION: 05/29/2022 Overall cognitive status: Within functional limits for tasks assessed    SENSATION: 05/29/2022 Not tested  POSTURE:  05/29/2022 flexed trunk  and weight shift left  PALPATION: 06/20/2022 - tenderness to palpation at distal IT band, no tenderness at joint line  LUMBAR ROM:   Active AROM  05/29/2022  Flexion   Extension   Right lateral flexion   Left lateral flexion   Right rotation   Left rotation    (Blank rows = not tested)  LOWER EXTREMITY ROM:     ROM (A: AROM, P: PROM) Right Eval 05/29/2022 Left Eval 05/29/2022 Right 06/20/22 Right 07/19/22  Hip flexion      Hip extension      Hip abduction      Hip adduction      Hip internal rotation      Hip external rotation      Knee flexion      Knee extension Seated LAQ A: -8* Seated LAQ WNL Seated  LAQ A: -2* Seated LAQ -2*  Ankle dorsiflexion      Ankle plantarflexion      Ankle inversion      Ankle eversion       (Blank rows = not tested)  LOWER EXTREMITY MMT:    MMT Right Eval 05/29/2022 Left Eval 05/29/2022 Right 06/20/22 Left  06/20/22 Right 07/31/2022 Left 07/31/2022  Hip flexion        Hip extension        Hip abduction        Hip adduction        Hip internal rotation        Hip external rotation        Knee flexion Seated  4/5 painful Seated  4/5 Seated HH Dynameter 35.8# & 36.7# RLE:LLE 86.7% Seated HH Dynameter 40.8# & 43.1#    Knee extension Seated  4/5 painful Seated  5/5 Seated HH Dynameter 52.9# & 54.1# RLE:LLE 99.7% Seated HH dynameter 55.9# & 52.4# 5/5 64.9, 65.1 lbs 5/5 57.7, 62.6 lbs  Ankle dorsiflexion        Ankle plantarflexion        Ankle inversion        Ankle eversion         (Blank rows = not tested)  FUNCTIONAL TESTS:  07/17/2022 Functional Gait Assessment: 23/30   06/20/2022 Functional gait assessment: 21/30   05/29/2022 5 times sit to stand: 11.38 sec, no UE use  (cutoff for older adults is <12sec)   Functional gait assessment: 11/30  [facility] PT Assessment - 05/29/22 0001                Functional Gait  Assessment    Gait assessed  Yes     Gait Level Surface Walks 20 ft, slow speed, abnormal gait pattern, evidence for imbalance or deviates 10-15 in outside of the 12 in walkway width. Requires more than 7 sec to ambulate 20 ft.     Change in Gait Speed Makes only minor adjustments to walking speed, or accomplishes a change in speed with significant gait deviations, deviates 10-15 in outside the 12 in walkway width, or changes speed but loses balance but is able to recover and continue walking.     Gait with Horizontal Head Turns Performs head turns with moderate changes in gait velocity, slows down, deviates 10-15 in outside 12 in walkway width but recovers, can continue to walk.     Gait with Vertical Head Turns Performs task with moderate change in gait velocity, slows down, deviates 10-15 in outside 12 in walkway width but recovers, can continue to walk.     Gait  and Pivot Turn Pivot turns safely in greater than 3 sec and stops with no loss of balance, or pivot turns safely within 3 sec and stops with mild imbalance, requires small steps to catch balance.     Step Over Obstacle Is able to step over one shoe box (4.5 in total height) but must slow down and adjust steps to clear box safely. May require verbal cueing.     Gait with Narrow Base of Support Ambulates less than 4 steps heel to toe or cannot perform without assistance.     Gait with Eyes Closed Walks 20 ft, slow speed, abnormal gait pattern, evidence for imbalance, deviates 10-15 in outside 12 in walkway width. Requires more than 9 sec to ambulate 20 ft.     Ambulating Backwards Walks 20 ft, uses assistive device, slower speed, mild gait deviations, deviates 6-10 in outside 12 in walkway width.     Steps Two feet to a stair, must use rail.     Total Score 11        GAIT: 07/17/2022: Pt amb without device  Gait velocity:   comfortable 4.03 ft/sec  fast pace 4.89 ft/sec   06/16/2022:  Independent ambulation into and through clinic between activity.  Less occurrences of lateral instability but still noted on occasion in stance phase SL for both LE.   05/29/2022 Distance walked: 200' Assistive device utilized: None Level of assistance: SBA Comments: shortened step length bil., flexed trunk, some M-L instability and near losses of balance  TODAY'S TREATMENT  07/31/2022: Therex: Nustep Lvl 6 UE/LE 6 mins Lateral stepping 6 inch 2 x 10 bilateral SKC 15 sec hold x 3 bilateral Supine lumbar trunk rotation stretch 15 sec x 3 bilateral Incline calf stretch bilateral 1 min x 3    Neuro Re-ed Tandem ambulation fwd/back 10 ft in // bars x 6 each way Fitter board rocker fwd/back 30x each way light touching occasional hand assist required  Standing feet together on foam :  external perts 30 seconds various directions and amounts. Reactive response to change of support in multiple directions x 10   07/26/2022 Therapeutic Exercise: Seated hamstring stretch 30 sec hold 2 reps with PT cues on technique Seated LAQ RLE 10 reps 5 sec hold,  added red theraband LAQ 10 reps Sit to/from stand without UEs 10 reps Hamstring curl blue theraband 10 reps Standing Terminal knee ext blue theraband 10 reps;  progressed to TKE with single limb stance Standing gastroc stretch 30 sec hold 2 reps ea LE Standing quad stretch with towel 30 sec hold 2 reps RLE Pt descend stairs 11 + 11 + 6steps with 2 rails alternating pattern.  Discussed doing with 2 rails then contralateral rails for functional strength.   PT instructed in well-rounded HEP to include flexibility, strength, balance & endurance. Pt verbalized understanding.    07/24/2022 Neuromuscular Re-education: Standing hip width apart:  eyes open - head motions side to side, progressed to looking to side with shoulder rotation, then progressed to look behind with weight shift /  hip rotation;   up / down head & eyes, added shoulders, added low back.   Eyes closed on floor hip width: head 4 directions Worked on step strategy:  -anterior, posterior & lateral anticipatory stepping off towel roll;   -Progressed to anticipatory / reactionary with reaching outside base until balance loss; -Reactionary with sudden release of resistance.  [place] stepping [place].  PT educated on performing at home with back stepping  towards counter for safety  pt verbalized understanding.     HOME EXERCISE PROGRAM: Access Code: [email or web address] URL: [email or web address] Date: 07/26/2022 Prepared by: Jamey Reas  Exercises - Seated Hamstring Stretch  - 1 x daily - 7 x weekly - 1 sets - 3 reps - 30 seconds hold - Seated Long Arc Quad  - 1 x daily - 5 x weekly - 1 sets - 10 reps - 5 seconds hold - Sitting Knee Extension with Resistance  - 1 x daily - 3 x weekly - 1 sets - 10 reps - 5 seconds hold - Sit to Stand  - 1 x daily - 3 x weekly - 1 sets - 10 reps - 5 seconds hold - Seated Hamstring Curl with Anchored Resistance  - 1 x daily - 3 x weekly - 1 sets - 10 reps - 5 seconds hold - Standing Terminal Knee Extension with Resistance  - 1 x daily - 3 x weekly - 1 sets - 10 reps - 5 seconds hold - Single Leg Stance  - 1 x daily - 3 x weekly - 1 sets - 10 reps - 5 seconds hold - Gastroc Stretch on Step  - 1 x daily - 7 x weekly - 1 sets - 3 reps - 30 seconds hold - Standing Quad Stretch with Strap  - 1 x daily - 7 x weekly - 1 sets - 3 reps - 30 seconds hold   Access Code: [email or web address] URL: [email or web address] Date: 07/13/2022 Prepared by: Jamey Reas  Exercises - Hooklying Single Knee to Chest Stretch  - 2-4 x daily - 7 x weekly - 1 sets - 3 reps - 15 seconds hold - Supine Double Knee to Chest Modified  - 2-4 x daily - 7 x weekly - 1 sets - 3 reps - 15 seconds hold - Supine Hamstring Stretch  - 2-4 x daily - 7 x weekly - 1 sets - 3 reps - 15 seconds  hold - Supine Lower Trunk Rotation  - 2-4 x daily - 7 x weekly - 1 sets - 3 reps - 15 seconds hold - Seated Pelvic Tilt  - 2-4 x daily - 7 x weekly - 1-3 sets - 10 reps - 5 seconds hold   Access Code: [email or web address] URL: [email or web address] Date: 06/06/2022 Prepared by: Jamey Reas  Exercises - Sit to Stand  - 3 x daily - 7 x weekly - 1 sets - 10 reps - Seated Straight Leg Heel Taps  - 1-2 x daily - 7 x weekly - 1-2 sets - 10 reps - Tandem Stance  - 1-2 x daily - 7 x weekly - 1 sets - 1-2 reps - 30- 60 sec hold - [place] with Counter Support  - 1-2 x daily - 7 x weekly - 1 sets - 10 reps - Standing hip extension alternating legs  - 1-2 x daily - 7 x weekly - 1 sets - 10 reps - 5 seconds hold - Standing hip abduction alternating legs  - 1-2 x daily - 7 x weekly - 1 sets - 10 reps - 5 seconds hold - Standing March with Counter Support  - 1-2 x daily - 7 x weekly - 1 sets - 10 reps - 5 seconds hold  ASSESSMENT:  CLINICAL IMPRESSION: Reassessment of strength testing showed good improvement bilaterally today.  Review of some lumbar stretching to help keep back pain moving in the correct direction.  Discussed possibility of next visit being transition to HEP.  Pt  was tentatively in agreement.    Medical necessity for continued treatment for today and next visit based off low back pain presence and ensuring good knowledge of HEP upon discharge.   OBJECTIVE IMPAIRMENTS Abnormal gait, decreased activity tolerance, decreased balance, decreased endurance, decreased mobility, difficulty walking, decreased ROM, decreased strength, postural dysfunction, and pain.   ACTIVITY LIMITATIONS carrying, lifting, bending, squatting, stairs, toileting, dressing, locomotion level, and caring for others  PARTICIPATION LIMITATIONS: meal prep, cleaning, laundry, driving, community activity, and yard work  [place], Time since onset of injury/illness/exacerbation, and 3+ comorbidities:  see PMH  are also affecting patient's functional outcome.   REHAB POTENTIAL: Good  CLINICAL DECISION MAKING: Evolving/moderate complexity  EVALUATION COMPLEXITY: Low   GOALS: Goals reviewed with patient? Yes  SHORT TERM GOALS: Target date: 08/10/22  Patient will be independent with updated HEP to continue progressing outside of clinic. Baseline: see objective data Goal status: ongoing 07/19/2022  2.  Patient able to recover balance within 1 step for step strategy testing anterior, posterior & laterally. Baseline: see objective data Goal status: set 07/19/2022   UPDATED LONG TERM GOALS: Target date: 08/30/22  Patient will score FOTO >/= 55% to reflect improved self-perceived functional ability. Baseline: see objective data Goal status: on going - assessed 07/31/2022   2.  Patient scores above a 25/30 on the FGA to represent low fall risk with gait activities. Baseline: see objective data Goal status: on going - assessed 07/31/2022  3.  Patient reports </= 2/10 back and knee pain with standing, gait, and stair activity. Baseline: see objective data Goal status: on going - assessed 110/23/2023  4.  Patient Rt knee seated LAQ to -2* to represent improved knee range and quadriceps strength. Baseline: see objective data Goal status: ongoing assessed 07/31/2022  5.  Patient safely ambulates 500' including ramp, curb, and stairs independently to allow for household and community mobility. Baseline: see objective data Goal status: met 07/31/2022  6.  Patient verbalizes and demonstrates ability to perform ADLs, household chores, and yardwork. Baseline: see objective data Goal status: on going 07/31/2022  PLAN: PT FREQUENCY: 2x/week  PT DURATION: 6 weeks  PLANNED INTERVENTIONS: Therapeutic exercises, Therapeutic activity, Neuromuscular re-education, Balance training, Gait training, Patient/Family education, Self Care, Joint mobilization, Stair training, Vestibular training,  DME instructions, Dry Needling, Electrical stimulation, Spinal mobilization, Cryotherapy, Moist heat, Ultrasound, Ionotophoresis 61m/ml Dexamethasone, Manual therapy, Re-evaluation, and physical performance tests .  PLAN FOR NEXT SESSION:  Possible transitioning to HEP.    MScot Jun PT, DPT, OCS, ATC 07/31/22  1:41 PM

## 2022-08-01 ENCOUNTER — Encounter: Payer: Medicare Other | Admitting: Physical Therapy

## 2022-08-02 ENCOUNTER — Encounter: Payer: Self-pay | Admitting: Physical Therapy

## 2022-08-02 ENCOUNTER — Ambulatory Visit (INDEPENDENT_AMBULATORY_CARE_PROVIDER_SITE_OTHER): Payer: Medicare Other | Admitting: Physical Therapy

## 2022-08-02 DIAGNOSIS — M5459 Other low back pain: Secondary | ICD-10-CM

## 2022-08-02 DIAGNOSIS — R2681 Unsteadiness on feet: Secondary | ICD-10-CM | POA: Diagnosis not present

## 2022-08-02 DIAGNOSIS — M25561 Pain in right knee: Secondary | ICD-10-CM

## 2022-08-02 DIAGNOSIS — M6281 Muscle weakness (generalized): Secondary | ICD-10-CM | POA: Diagnosis not present

## 2022-08-02 DIAGNOSIS — R2689 Other abnormalities of gait and mobility: Secondary | ICD-10-CM

## 2022-08-02 NOTE — Therapy (Signed)
OUTPATIENT PHYSICAL THERAPY TREATMENT Patient Name: Katie Weber MRN: [id number] DOB:12/08/1945, 76 y.o., female Today's Date: 08/02/2022  END OF SESSION:  PT End of Session - 08/02/22 1255     Visit Number 17    Number of Visits 25    Date for PT Re-Evaluation 08/30/22    Authorization Type Medicare Part A and B    Progress Note Due on Visit 56    PT Start Time 5701    PT Stop Time 1345    PT Time Calculation (min) 49 min    Activity Tolerance Patient tolerated treatment well    Behavior During Therapy [facility] for tasks assessed/performed               Past Medical History:  Diagnosis Date   Arthritis    arthritis fingers   Coronary artery calcification 04/24/2017   Diverticulitis    x2 -last 1'17 tx. outpatient.   Environmental and seasonal allergies    2014 Possibly related to air freshners.   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   Family history of anesthesia complication 77LTJ ago   daughter had an allergic reaction to atropine   GERD (gastroesophageal reflux disease)    History of hiatal hernia 09/14/2014   History of kidney stones    '80's lithotripsy-[facility] x1   Hyperlipidemia    Hypothyroidism    Pneumonia 2014   Pure hypercholesterolemia    Tremor of left hand    Past Surgical History:  Procedure Laterality Date   BALLOON DILATION N/A 05/04/2014   Procedure: BALLOON DILATION;  Surgeon: Garlan Fair, MD;  Location: [facility];  Service: Endoscopy;  Laterality: N/A;   BREAST LUMPECTOMY Right 1972   benign    BREAST SURGERY Right    '72 -benign tumor   childbirth- NVD x2      CHOLECYSTECTOMY N/A 03/10/2022   Procedure: LAPAROSCOPIC CHOLECYSTECTOMY;  Surgeon: Jovita Kussmaul, MD;  Location: [place];  Service: General;  Laterality: N/A;   COLONOSCOPY WITH PROPOFOL N/A 05/04/2014   Procedure: COLONOSCOPY WITH PROPOFOL;  Surgeon: Garlan Fair, MD;  Location: [facility];  Service:  Endoscopy;  Laterality: N/A;   DILATION AND CURETTAGE OF UTERUS     04-20-14 [facility] hospital endometrial polyp removed.   ESOPHAGOGASTRODUODENOSCOPY (EGD) WITH PROPOFOL N/A 05/04/2014   Procedure: ESOPHAGOGASTRODUODENOSCOPY (EGD) WITH PROPOFOL;  Surgeon: Garlan Fair, MD;  Location: [facility];  Service: Endoscopy;  Laterality: N/A;. Procedure done in [place], [place]- 5 yrs ago.   HYSTEROSCOPY WITH D & C N/A 04/20/2014   Procedure: DILATATION AND CURETTAGE /HYSTEROSCOPY;  Surgeon: [name], MD;  Location: [facility];  Service: Gynecology;  Laterality: N/A;   IR PERC CHOLECYSTOSTOMY  01/08/2022   IR RADIOLOGIST EVAL & MGMT  02/10/2022   LAPAROSCOPY N/A 03/14/2022   Procedure: LAPAROSCOPY DIAGNOSTIC  WITH IRRIGATION AND  DRAINAGE OF Lucia Bitter;  Surgeon: Jovita Kussmaul, MD;  Location: [facility];  Service: General;  Laterality: N/A;   LITHOTRIPSY     POLYPECTOMY  2020   Dr. Lear Ng   TOTAL KNEE ARTHROPLASTY Left 02/15/2021   Procedure: LEFT TOTAL KNEE ARTHROPLASTY;  Surgeon: Mcarthur Rossetti, MD;  Location: [facility];  Service: Orthopedics;  Laterality: Left;   TOTAL KNEE ARTHROPLASTY Right 08/09/2021   Procedure: RIGHT TOTAL KNEE ARTHROPLASTY;  Surgeon: Mcarthur Rossetti, MD;  Location: [place] [place];  Service: Orthopedics;  Laterality: Right;  Needs RNFA  TUBAL LIGATION     Patient Active Problem List   Diagnosis Date Noted   Hypovolemia due to dehydration 03/15/2022   AKI (acute kidney injury) ([place]) 03/15/2022   Biloma following surgery 03/14/2022   Abdominal pain 03/13/2022   Postoperative pain 03/13/2022   Acute cholecystitis 17/40/8144   Biliary colic 81/85/6314   New onset atrial flutter ([place]) 01/07/2022   DJD (degenerative joint disease) of knee 08/09/2021   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   Unilateral primary osteoarthritis, right knee 06/06/2021   Status post total left knee replacement 02/15/2021   Unilateral primary osteoarthritis, left knee  01/04/2021   Coronary artery calcification 04/24/2017   Hyperlipidemia 04/24/2017   Acute bronchitis 09/29/2014   SBO (small bowel obstruction) ([place]) 09/27/2014   Nausea and vomiting 09/27/2014   Hypothyroidism 09/27/2014   Leukocytosis 09/27/2014    PCP: Leeroy Cha, MD  REFERRING PROVIDER: Jovita Kussmaul, MD  REFERRING DIAG: T81.89XA,K66.8 (ICD-10-CM) - Biloma following surgery, initial encounter G89.18 (ICD-10-CM) - Postoperative pain  Rationale for Evaluation and Treatment Rehabilitation  THERAPY DIAG:  Unsteadiness on feet  Other abnormalities of gait and mobility  Muscle weakness (generalized)  Other low back pain  Acute pain of right knee  ONSET DATE: 03/17/22 Referral  SUBJECTIVE:                                                                                                                                                                                           SUBJECTIVE STATEMENT: She was able to balance when large family dog jumped on her.  She went to mall with her grands without issues. She has been able to do her exercises because her husband is bedridden for last 2 days.  She feels that she is not ready for discharge from PT due to pain & weakness as she increases her activities.   PERTINENT HISTORY:  Cholecystectomy 03/10/22, laparoscopy 03/14/22 TKA Lt 5/22 Rt 11/22, hypothyroidism, hyperlipidemia, OA  PAIN:  NPRS scale:  4-5/10 since last PT lowest 4/10 & highest 5/10 Pain location:back upper lumbar & lower thoracic Pain description: dull achy Aggravating factors: standing Relieving factors:  walking & flexing forward   NPRS scale:  3/10, since last PT lowest 0/10 & highest 3/10 Pain location: Rt anterior-lateral knee and patella Pain description: soreness Aggravating factors: first getting up in the morning or initially moving after knee stiffening, bending Relieving factors: moving  PRECAUTIONS: None  WEIGHT BEARING RESTRICTIONS  No  FALLS:  Has patient fallen in last 6 months? No  LIVING ENVIRONMENT: Lives with: lives with their spouse Lives in: House/apartment Stairs: Yes: Internal: 13 steps; on  left going up and External: 2 steps; can reach both Has following equipment at home: Single point cane, Walker - 2 wheeled, Manufacturing engineer  OCCUPATION: retired, for leisure she enjoys hiking  PLOF: [place] return to activity level prior to surgery without pain   OBJECTIVE:   PATIENT SURVEYS:  08/02/2022:  FOTO 57% 07/17/2022:  FOTO 50% 06/20/2022 FOTO 53% 05/29/2022 FOTO  44%, target 55%  COGNITION: 05/29/2022 Overall cognitive status: Within functional limits for tasks assessed    SENSATION: 05/29/2022 Not tested  POSTURE:  05/29/2022 flexed trunk  and weight shift left  PALPATION: 06/20/2022 - tenderness to palpation at distal IT band, no tenderness at joint line  LUMBAR ROM:   Active AROM  05/29/2022  Flexion   Extension   Right lateral flexion   Left lateral flexion   Right rotation   Left rotation    (Blank rows = not tested)  LOWER EXTREMITY ROM:     ROM (A: AROM, P: PROM) Right Eval 05/29/2022 Left Eval 05/29/2022 Right 06/20/22 Right 07/19/22  Hip flexion      Hip extension      Hip abduction      Hip adduction      Hip internal rotation      Hip external rotation      Knee flexion      Knee extension Seated LAQ A: -8* Seated LAQ WNL Seated  LAQ A: -2* Seated LAQ -2*  Ankle dorsiflexion      Ankle plantarflexion      Ankle inversion      Ankle eversion       (Blank rows = not tested)  LOWER EXTREMITY MMT:    MMT Right Eval 05/29/2022 Left Eval 05/29/2022 Right 06/20/22 Left  06/20/22 Right 07/31/2022 Left 07/31/2022  Hip flexion        Hip extension        Hip abduction        Hip adduction        Hip internal rotation        Hip external rotation        Knee flexion Seated  4/5 painful Seated  4/5 Seated HH Dynameter 35.8# &  36.7# RLE:LLE 86.7% Seated HH Dynameter 40.8# & 43.1#    Knee extension Seated  4/5 painful Seated  5/5 Seated HH Dynameter 52.9# & 54.1# RLE:LLE 99.7% Seated HH dynameter 55.9# & 52.4# 5/5 64.9, 65.1 lbs 5/5 57.7, 62.6 lbs  Ankle dorsiflexion        Ankle plantarflexion        Ankle inversion        Ankle eversion         (Blank rows = not tested)  FUNCTIONAL TESTS:  07/17/2022 Functional Gait Assessment: 23/30   06/20/2022 Functional gait assessment: 21/30   05/29/2022 5 times sit to stand: 11.38 sec, no UE use  (cutoff for older adults is <12sec)  Functional gait assessment: 11/30  [facility] PT Assessment - 05/29/22 0001                Functional Gait  Assessment    Gait assessed  Yes     Gait Level Surface Walks 20 ft, slow speed, abnormal gait pattern, evidence for imbalance or deviates 10-15 in outside of the 12 in walkway width. Requires more than 7 sec to ambulate 20 ft.     Change in Gait Speed Makes only minor adjustments to walking speed, or accomplishes a change in speed  with significant gait deviations, deviates 10-15 in outside the 12 in walkway width, or changes speed but loses balance but is able to recover and continue walking.     Gait with Horizontal Head Turns Performs head turns with moderate changes in gait velocity, slows down, deviates 10-15 in outside 12 in walkway width but recovers, can continue to walk.     Gait with Vertical Head Turns Performs task with moderate change in gait velocity, slows down, deviates 10-15 in outside 12 in walkway width but recovers, can continue to walk.     Gait and Pivot Turn Pivot turns safely in greater than 3 sec and stops with no loss of balance, or pivot turns safely within 3 sec and stops with mild imbalance, requires small steps to catch balance.     Step Over Obstacle Is able to step over one shoe box (4.5 in total height) but must slow down and adjust steps to clear box safely. May require verbal cueing.     Gait  with Narrow Base of Support Ambulates less than 4 steps heel to toe or cannot perform without assistance.     Gait with Eyes Closed Walks 20 ft, slow speed, abnormal gait pattern, evidence for imbalance, deviates 10-15 in outside 12 in walkway width. Requires more than 9 sec to ambulate 20 ft.     Ambulating Backwards Walks 20 ft, uses assistive device, slower speed, mild gait deviations, deviates 6-10 in outside 12 in walkway width.     Steps Two feet to a stair, must use rail.     Total Score 11        GAIT: 07/17/2022: Pt amb without device  Gait velocity:  comfortable 4.03 ft/sec  fast pace 4.89 ft/sec   06/16/2022:  Independent ambulation into and through clinic between activity.  Less occurrences of lateral instability but still noted on occasion in stance phase SL for both LE.   05/29/2022 Distance walked: 200' Assistive device utilized: None Level of assistance: SBA Comments: shortened step length bil., flexed trunk, some M-L instability and near losses of balance  TODAY'S TREATMENT  08/02/2022 Neuromuscular Re-education: Rocker board with double pivot with light BUE support 1 min 2 reps ant/level/post and right/level/left Sit to/from stand from chair without armrests using UEs to push with feet on foam mat requiring increased stabilization 10 reps.  Therapeutic Exercise Step up, over & down with BOSU round side up leading with BLEs 10 reps ea with light BUE support. Lateral step up with forward & back crossovers BOSU round side up BLEs 10 reps ea with light BUE support. Standing at door frame reaching overhead single & BUEs 2 reps ea 2 deep breath hold.  Pt reports it calmed her back down which had increased pain with PT activities.    07/31/2022: Therex: Nustep Lvl 6 UE/LE 6 mins Lateral stepping 6 inch 2 x 10 bilateral SKC 15 sec hold x 3 bilateral Supine lumbar trunk rotation stretch 15 sec x 3 bilateral Incline calf stretch bilateral 1 min x 3    Neuro  Re-ed Tandem ambulation fwd/back 10 ft in // bars x 6 each way Fitter board rocker fwd/back 30x each way light touching occasional hand assist required  Standing feet together on foam :  external perts 30 seconds various directions and amounts. Reactive response to change of support in multiple directions x 10   07/26/2022 Therapeutic Exercise: Seated hamstring stretch 30 sec hold 2 reps with PT cues on technique Seated LAQ RLE 10  reps 5 sec hold,  added red theraband LAQ 10 reps Sit to/from stand without UEs 10 reps Hamstring curl blue theraband 10 reps Standing Terminal knee ext blue theraband 10 reps;  progressed to TKE with single limb stance Standing gastroc stretch 30 sec hold 2 reps ea LE Standing quad stretch with towel 30 sec hold 2 reps RLE Pt descend stairs 11 + 11 + 6steps with 2 rails alternating pattern.  Discussed doing with 2 rails then contralateral rails for functional strength.   PT instructed in well-rounded HEP to include flexibility, strength, balance & endurance. Pt verbalized understanding.    HOME EXERCISE PROGRAM: Access Code: [email or web address] URL: [email or web address] Date: 07/26/2022 Prepared by: Jamey Reas  Exercises - Seated Hamstring Stretch  - 1 x daily - 7 x weekly - 1 sets - 3 reps - 30 seconds hold - Seated Long Arc Quad  - 1 x daily - 5 x weekly - 1 sets - 10 reps - 5 seconds hold - Sitting Knee Extension with Resistance  - 1 x daily - 3 x weekly - 1 sets - 10 reps - 5 seconds hold - Sit to Stand  - 1 x daily - 3 x weekly - 1 sets - 10 reps - 5 seconds hold - Seated Hamstring Curl with Anchored Resistance  - 1 x daily - 3 x weekly - 1 sets - 10 reps - 5 seconds hold - Standing Terminal Knee Extension with Resistance  - 1 x daily - 3 x weekly - 1 sets - 10 reps - 5 seconds hold - Single Leg Stance  - 1 x daily - 3 x weekly - 1 sets - 10 reps - 5 seconds hold - Gastroc Stretch on Step  - 1 x daily - 7 x weekly - 1 sets - 3 reps - 30  seconds hold - Standing Quad Stretch with Strap  - 1 x daily - 7 x weekly - 1 sets - 3 reps - 30 seconds hold   Access Code: [email or web address] URL: [email or web address] Date: 07/13/2022 Prepared by: Jamey Reas  Exercises - Hooklying Single Knee to Chest Stretch  - 2-4 x daily - 7 x weekly - 1 sets - 3 reps - 15 seconds hold - Supine Double Knee to Chest Modified  - 2-4 x daily - 7 x weekly - 1 sets - 3 reps - 15 seconds hold - Supine Hamstring Stretch  - 2-4 x daily - 7 x weekly - 1 sets - 3 reps - 15 seconds hold - Supine Lower Trunk Rotation  - 2-4 x daily - 7 x weekly - 1 sets - 3 reps - 15 seconds hold - Seated Pelvic Tilt  - 2-4 x daily - 7 x weekly - 1-3 sets - 10 reps - 5 seconds hold   Access Code: [email or web address] URL: [email or web address] Date: 06/06/2022 Prepared by: Jamey Reas  Exercises - Sit to Stand  - 3 x daily - 7 x weekly - 1 sets - 10 reps - Seated Straight Leg Heel Taps  - 1-2 x daily - 7 x weekly - 1-2 sets - 10 reps - Tandem Stance  - 1-2 x daily - 7 x weekly - 1 sets - 1-2 reps - 30- 60 sec hold - Carioca with Counter Support  - 1-2 x daily - 7 x weekly - 1 sets - 10 reps - Standing hip extension alternating legs  - 1-2 x daily - 7 x weekly - 1 sets - 10 reps - 5  seconds hold - Standing hip abduction alternating legs  - 1-2 x daily - 7 x weekly - 1 sets - 10 reps - 5 seconds hold - Standing March with Counter Support  - 1-2 x daily - 7 x weekly - 1 sets - 10 reps - 5 seconds hold  ASSESSMENT:  CLINICAL IMPRESSION: Patient does not appear ready for PT discharge yet as back & knee pain increased with activities.  Pt was challenged by balance activities facilitating hip & ankle strategies and functional activities for knee. Pt continues to benefit from skilled PT.   OBJECTIVE IMPAIRMENTS Abnormal gait, decreased activity tolerance, decreased balance, decreased endurance, decreased mobility, difficulty walking, decreased ROM, decreased  strength, postural dysfunction, and pain.   ACTIVITY LIMITATIONS carrying, lifting, bending, squatting, stairs, toileting, dressing, locomotion level, and caring for others  PARTICIPATION LIMITATIONS: meal prep, cleaning, laundry, driving, community activity, and yard work  [place], Time since onset of injury/illness/exacerbation, and 3+ comorbidities: see PMH  are also affecting patient's functional outcome.   REHAB POTENTIAL: Good  CLINICAL DECISION MAKING: Evolving/moderate complexity  EVALUATION COMPLEXITY: Low   GOALS: Goals reviewed with patient? Yes  SHORT TERM GOALS: Target date: 08/10/22  Patient will be independent with updated HEP to continue progressing outside of clinic. Baseline: see objective data Goal status: ongoing 07/19/2022  2.  Patient able to recover balance within 1 step for step strategy testing anterior, posterior & laterally. Baseline: see objective data Goal status: set 07/19/2022   UPDATED LONG TERM GOALS: Target date: 08/30/22  Patient will score FOTO >/= 55% to reflect improved self-perceived functional ability. Baseline: see objective data Goal status: on going - assessed 07/31/2022   2.  Patient scores above a 25/30 on the FGA to represent low fall risk with gait activities. Baseline: see objective data Goal status: on going - assessed 07/31/2022  3.  Patient reports </= 2/10 back and knee pain with standing, gait, and stair activity. Baseline: see objective data Goal status: on going - assessed 110/23/2023  4.  Patient Rt knee seated LAQ to -2* to represent improved knee range and quadriceps strength. Baseline: see objective data Goal status: ongoing assessed 07/31/2022  5.  Patient safely ambulates 500' including ramp, curb, and stairs independently to allow for household and community mobility. Baseline: see objective data Goal status: met 07/31/2022  6.  Patient verbalizes and demonstrates ability to perform ADLs,  household chores, and yardwork. Baseline: see objective data Goal status: on going 07/31/2022  PLAN: PT FREQUENCY: 2x/week  PT DURATION: 6 weeks  PLANNED INTERVENTIONS: Therapeutic exercises, Therapeutic activity, Neuromuscular re-education, Balance training, Gait training, Patient/Family education, Self Care, Joint mobilization, Stair training, Vestibular training, DME instructions, Dry Needling, Electrical stimulation, Spinal mobilization, Cryotherapy, Moist heat, Ultrasound, Ionotophoresis 51m/ml Dexamethasone, Manual therapy, Re-evaluation, and physical performance tests .  PLAN FOR NEXT SESSION:   work towards [place]   work on balance reactions including ankle, hip & step strategy activities, standing & gait for balance.   progress tolerance to functional activities.    RJamey Reas PT, DPT 08/02/2022, 5:09 PM

## 2022-08-03 ENCOUNTER — Encounter: Payer: Medicare Other | Admitting: Physical Therapy

## 2022-08-07 ENCOUNTER — Encounter: Payer: Medicare Other | Admitting: Rehabilitative and Restorative Service Providers"

## 2022-08-08 ENCOUNTER — Ambulatory Visit (INDEPENDENT_AMBULATORY_CARE_PROVIDER_SITE_OTHER): Payer: Medicare Other | Admitting: Physical Therapy

## 2022-08-08 ENCOUNTER — Encounter: Payer: Self-pay | Admitting: Physical Therapy

## 2022-08-08 DIAGNOSIS — R2681 Unsteadiness on feet: Secondary | ICD-10-CM | POA: Diagnosis not present

## 2022-08-08 DIAGNOSIS — M25561 Pain in right knee: Secondary | ICD-10-CM

## 2022-08-08 DIAGNOSIS — M6281 Muscle weakness (generalized): Secondary | ICD-10-CM | POA: Diagnosis not present

## 2022-08-08 DIAGNOSIS — M5459 Other low back pain: Secondary | ICD-10-CM

## 2022-08-08 DIAGNOSIS — R2689 Other abnormalities of gait and mobility: Secondary | ICD-10-CM | POA: Diagnosis not present

## 2022-08-08 DIAGNOSIS — M25661 Stiffness of right knee, not elsewhere classified: Secondary | ICD-10-CM

## 2022-08-08 DIAGNOSIS — R600 Localized edema: Secondary | ICD-10-CM

## 2022-08-08 NOTE — Therapy (Signed)
OUTPATIENT PHYSICAL THERAPY TREATMENT Patient Name: Katie Weber MRN: [id number] DOB:[birth year], 76 y.o., female Today's Date: 08/08/2022  END OF SESSION:  PT End of Session - 08/08/22 1429     Visit Number 18    Number of Visits 25    Date for PT Re-Evaluation 08/30/22    Authorization Type Medicare Part A and B    PT Start Time 1430    PT Stop Time 1518    PT Time Calculation (min) 48 min    Activity Tolerance Patient tolerated treatment well    Behavior During Therapy [facility] for tasks assessed/performed                Past Medical History:  Diagnosis Date   Arthritis    arthritis fingers   Coronary artery calcification 04/24/2017   Diverticulitis    x2 -last 1'17 tx. outpatient.   Environmental and seasonal allergies    2014 Possibly related to air freshners.   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   Family history of anesthesia complication 34HDQ ago   daughter had an allergic reaction to atropine   GERD (gastroesophageal reflux disease)    History of hiatal hernia 09/14/2014   History of kidney stones    '80's lithotripsy-UVA x1   Hyperlipidemia    Hypothyroidism    Pneumonia 2014   Pure hypercholesterolemia    Tremor of left hand    Past Surgical History:  Procedure Laterality Date   BALLOON DILATION N/A 05/04/2014   Procedure: BALLOON DILATION;  Surgeon: Garlan Fair, MD;  Location: [facility];  Service: Endoscopy;  Laterality: N/A;   BREAST LUMPECTOMY Right 1972   benign    BREAST SURGERY Right    '72 -benign tumor   childbirth- NVD x2      CHOLECYSTECTOMY N/A 03/10/2022   Procedure: LAPAROSCOPIC CHOLECYSTECTOMY;  Surgeon: Jovita Kussmaul, MD;  Location: [place];  Service: General;  Laterality: N/A;   COLONOSCOPY WITH PROPOFOL N/A 05/04/2014   Procedure: COLONOSCOPY WITH PROPOFOL;  Surgeon: Garlan Fair, MD;  Location: [facility];  Service: Endoscopy;  Laterality: N/A;    DILATION AND CURETTAGE OF UTERUS     04-20-14 [facility] endometrial polyp removed.   ESOPHAGOGASTRODUODENOSCOPY (EGD) WITH PROPOFOL N/A 05/04/2014   Procedure: ESOPHAGOGASTRODUODENOSCOPY (EGD) WITH PROPOFOL;  Surgeon: Garlan Fair, MD;  Location: [facility];  Service: Endoscopy;  Laterality: N/A;. Procedure done in [place], [place]- 5 yrs ago.   HYSTEROSCOPY WITH D & C N/A 04/20/2014   Procedure: DILATATION AND CURETTAGE /HYSTEROSCOPY;  Surgeon: [name], MD;  Location: [facility];  Service: Gynecology;  Laterality: N/A;   IR PERC CHOLECYSTOSTOMY  01/08/2022   IR RADIOLOGIST EVAL & MGMT  02/10/2022   LAPAROSCOPY N/A 03/14/2022   Procedure: LAPAROSCOPY DIAGNOSTIC  WITH IRRIGATION AND  DRAINAGE OF Lucia Bitter;  Surgeon: Jovita Kussmaul, MD;  Location: [place];  Service: General;  Laterality: N/A;   LITHOTRIPSY     POLYPECTOMY  2020   Dr. Lear Ng   TOTAL KNEE ARTHROPLASTY Left 02/15/2021   Procedure: LEFT TOTAL KNEE ARTHROPLASTY;  Surgeon: Mcarthur Rossetti, MD;  Location: [place];  Service: Orthopedics;  Laterality: Left;   TOTAL KNEE ARTHROPLASTY Right 08/09/2021   Procedure: RIGHT TOTAL KNEE ARTHROPLASTY;  Surgeon: Mcarthur Rossetti, MD;  Location: [place];  Service: Orthopedics;  Laterality: Right;  Needs RNFA   TUBAL LIGATION     Patient  Active Problem List   Diagnosis Date Noted   Hypovolemia due to dehydration 03/15/2022   AKI (acute kidney injury) ([place]) 03/15/2022   Biloma following surgery 03/14/2022   Abdominal pain 03/13/2022   Postoperative pain 03/13/2022   Acute cholecystitis 46/27/0350   Biliary colic 09/38/1829   New onset atrial flutter ([place]) 01/07/2022   DJD (degenerative joint disease) of knee 08/09/2021   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   Unilateral primary osteoarthritis, right knee 06/06/2021   Status post total left knee replacement 02/15/2021   Unilateral primary osteoarthritis, left knee 01/04/2021   Coronary artery  calcification 04/24/2017   Hyperlipidemia 04/24/2017   Acute bronchitis 09/29/2014   SBO (small bowel obstruction) ([place]) 09/27/2014   Nausea and vomiting 09/27/2014   Hypothyroidism 09/27/2014   Leukocytosis 09/27/2014    PCP: Leeroy Cha, MD  REFERRING PROVIDER: Jovita Kussmaul, MD  REFERRING DIAG: T81.89XA,K66.8 (ICD-10-CM) - Biloma following surgery, initial encounter G89.18 (ICD-10-CM) - Postoperative pain  Rationale for Evaluation and Treatment Rehabilitation  THERAPY DIAG:  Unsteadiness on feet  Other abnormalities of gait and mobility  Muscle weakness (generalized)  Other low back pain  Acute pain of right knee  Stiffness of right knee, not elsewhere classified  Localized edema  ONSET DATE: 03/17/22 Referral  SUBJECTIVE:                                                                                                                                                                                           SUBJECTIVE STATEMENT: She spent 2 days in yard doing back work so is sore.  She had 2 balance losses with trimmer and was able to catch herself.    PERTINENT HISTORY:  Cholecystectomy 03/10/22, laparoscopy 03/14/22 TKA Lt 5/22 Rt 11/22, hypothyroidism, hyperlipidemia, OA  PAIN:  NPRS scale: 4-5/10 since last PT lowest 1/10 & highest 5/10 Pain location:back upper lumbar & lower thoracic Pain description: dull achy Aggravating factors: standing Relieving factors:  walking & flexing forward   NPRS scale:  6/10, since last PT lowest 0/10 & highest 6/10 Pain location: Rt anterior-lateral knee and patella Pain description: soreness Aggravating factors: first getting up in the morning or initially moving after knee stiffening, bending Relieving factors: moving  PRECAUTIONS: None  WEIGHT BEARING RESTRICTIONS No  FALLS:  Has patient fallen in last 6 months? No  LIVING ENVIRONMENT: Lives with: lives with their spouse Lives in: House/apartment Stairs:  Yes: Internal: 13 steps; on left going up and External: 2 steps; can reach both Has following equipment at home: Single point cane, Walker - 2 wheeled, and Electronics engineer  OCCUPATION: retired, for leisure she  enjoys hiking  PLOF: Independent  PATIENT GOALS return to activity level prior to surgery without pain   OBJECTIVE:   PATIENT SURVEYS:  08/02/2022:  FOTO 57% 07/17/2022:  FOTO 50% 06/20/2022 FOTO 53% 05/29/2022 FOTO  44%, target 55%  COGNITION: 05/29/2022 Overall cognitive status: Within functional limits for tasks assessed    SENSATION: 05/29/2022 Not tested  POSTURE:  05/29/2022 flexed trunk  and weight shift left  PALPATION: 06/20/2022 - tenderness to palpation at distal IT band, no tenderness at joint line  LUMBAR ROM:   Active AROM  05/29/2022  Flexion   Extension   Right lateral flexion   Left lateral flexion   Right rotation   Left rotation    (Blank rows = not tested)  LOWER EXTREMITY ROM:     ROM (A: AROM, P: PROM) Right Eval 05/29/2022 Left Eval 05/29/2022 Right 06/20/22 Right 07/19/22  Hip flexion      Hip extension      Hip abduction      Hip adduction      Hip internal rotation      Hip external rotation      Knee flexion      Knee extension Seated LAQ A: -8* Seated LAQ WNL Seated  LAQ A: -2* Seated LAQ -2*  Ankle dorsiflexion      Ankle plantarflexion      Ankle inversion      Ankle eversion       (Blank rows = not tested)  LOWER EXTREMITY MMT:    MMT Right Eval 05/29/2022 Left Eval 05/29/2022 Right 06/20/22 Left  06/20/22 Right 07/31/2022 Left 07/31/2022  Hip flexion        Hip extension        Hip abduction        Hip adduction        Hip internal rotation        Hip external rotation        Knee flexion Seated  4/5 painful Seated  4/5 Seated HH Dynameter 35.8# & 36.7# RLE:LLE 86.7% Seated HH Dynameter 40.8# & 43.1#    Knee extension Seated  4/5 painful Seated  5/5 Seated HH Dynameter 52.9# & 54.1# RLE:LLE 99.7%  Seated HH dynameter 55.9# & 52.4# 5/5 64.9, 65.1 lbs 5/5 57.7, 62.6 lbs  Ankle dorsiflexion        Ankle plantarflexion        Ankle inversion        Ankle eversion         (Blank rows = not tested)  FUNCTIONAL TESTS:  07/17/2022 Functional Gait Assessment: 23/30   06/20/2022 Functional gait assessment: 21/30   05/29/2022 5 times sit to stand: 11.38 sec, no UE use  (cutoff for older adults is <12sec)  Functional gait assessment: 11/30  [facility] PT Assessment - 05/29/22 0001                Functional Gait  Assessment    Gait assessed  Yes     Gait Level Surface Walks 20 ft, slow speed, abnormal gait pattern, evidence for imbalance or deviates 10-15 in outside of the 12 in walkway width. Requires more than 7 sec to ambulate 20 ft.     Change in Gait Speed Makes only minor adjustments to walking speed, or accomplishes a change in speed with significant gait deviations, deviates 10-15 in outside the 12 in walkway width, or changes speed but loses balance but is able to recover and continue walking.  Gait with Horizontal Head Turns Performs head turns with moderate changes in gait velocity, slows down, deviates 10-15 in outside 12 in walkway width but recovers, can continue to walk.     Gait with Vertical Head Turns Performs task with moderate change in gait velocity, slows down, deviates 10-15 in outside 12 in walkway width but recovers, can continue to walk.     Gait and Pivot Turn Pivot turns safely in greater than 3 sec and stops with no loss of balance, or pivot turns safely within 3 sec and stops with mild imbalance, requires small steps to catch balance.     Step Over Obstacle Is able to step over one shoe box (4.5 in total height) but must slow down and adjust steps to clear box safely. May require verbal cueing.     Gait with Narrow Base of Support Ambulates less than 4 steps heel to toe or cannot perform without assistance.     Gait with Eyes Closed Walks 20 ft, slow speed,  abnormal gait pattern, evidence for imbalance, deviates 10-15 in outside 12 in walkway width. Requires more than 9 sec to ambulate 20 ft.     Ambulating Backwards Walks 20 ft, uses assistive device, slower speed, mild gait deviations, deviates 6-10 in outside 12 in walkway width.     Steps Two feet to a stair, must use rail.     Total Score 11        GAIT: 07/17/2022: Pt amb without device  Gait velocity:  comfortable 4.03 ft/sec  fast pace 4.89 ft/sec   06/16/2022:  Independent ambulation into and through clinic between activity.  Less occurrences of lateral instability but still noted on occasion in stance phase SL for both LE.   05/29/2022 Distance walked: 200' Assistive device utilized: None Level of assistance: SBA Comments: shortened step length bil., flexed trunk, some M-L instability and near losses of balance  TODAY'S TREATMENT  08/08/2022 Neuromuscular Re-ed: PT demo & verbal cue on weight shift between LEs for push / pull. Pt able to perform against resistance with PT suddenly releasing resistance requiring step strategy.  Activity stepping from 8" box onto rocker board (progressed from square double pivot to round single pivot) to floor and other side of box onto foam mat working on transitioning between different surfaces including unstable that will tilt, solid & compliant surfaces. Pt performed initially with UE support //bars and progressed to intermittent touch. Catching & throwing ball with forward & backward step. Progressed from regular ball to 3kg ball.  PT educated on standing balance challenge with 4 foot positions (easiest feet hip width, 2.feet together 3.modified tandem with ea LE in back 4.tandem with ea LE in back), eyes closed, compliant surface, crossways compliant beam with toes/heels over edge and head motions 4 directions.  Static tandem on beam eyes open. Static crossways on beam eyes open feet together. Static Crossways on beam eyes closed feet ~2" apart.  Crossways on beam hip width eyes open head motions.  Tandem on floor eyes closed static.  Pt verbalizes how to vary components to challenge her balance.     08/02/2022 Neuromuscular Re-education: Rocker board with double pivot with light BUE support 1 min 2 reps ant/level/post and right/level/left Sit to/from stand from chair without armrests using UEs to push with feet on foam mat requiring increased stabilization 10 reps.  Therapeutic Exercise Step up, over & down with BOSU round side up leading with BLEs 10 reps ea with light BUE support. Lateral step  up with forward & back crossovers BOSU round side up BLEs 10 reps ea with light BUE support. Standing at door frame reaching overhead single & BUEs 2 reps ea 2 deep breath hold.  Pt reports it calmed her back down which had increased pain with PT activities.    07/31/2022: Therex: Nustep Lvl 6 UE/LE 6 mins Lateral stepping 6 inch 2 x 10 bilateral SKC 15 sec hold x 3 bilateral Supine lumbar trunk rotation stretch 15 sec x 3 bilateral Incline calf stretch bilateral 1 min x 3    Neuro Re-ed Tandem ambulation fwd/back 10 ft in // bars x 6 each way Fitter board rocker fwd/back 30x each way light touching occasional hand assist required  Standing feet together on foam :  external perts 30 seconds various directions and amounts. Reactive response to change of support in multiple directions x 10 .    HOME EXERCISE PROGRAM: Access Code: [email or web address] URL: [email or web address] Date: 07/26/2022 Prepared by: Jamey Reas  Exercises - Seated Hamstring Stretch  - 1 x daily - 7 x weekly - 1 sets - 3 reps - 30 seconds hold - Seated Long Arc Quad  - 1 x daily - 5 x weekly - 1 sets - 10 reps - 5 seconds hold - Sitting Knee Extension with Resistance  - 1 x daily - 3 x weekly - 1 sets - 10 reps - 5 seconds hold - Sit to Stand  - 1 x daily - 3 x weekly - 1 sets - 10 reps - 5 seconds hold - Seated Hamstring Curl with Anchored Resistance   - 1 x daily - 3 x weekly - 1 sets - 10 reps - 5 seconds hold - Standing Terminal Knee Extension with Resistance  - 1 x daily - 3 x weekly - 1 sets - 10 reps - 5 seconds hold - Single Leg Stance  - 1 x daily - 3 x weekly - 1 sets - 10 reps - 5 seconds hold - Gastroc Stretch on Step  - 1 x daily - 7 x weekly - 1 sets - 3 reps - 30 seconds hold - Standing Quad Stretch with Strap  - 1 x daily - 7 x weekly - 1 sets - 3 reps - 30 seconds hold   Access Code: [email or web address] URL: [email or web address] Date: 07/13/2022 Prepared by: Jamey Reas  Exercises - Hooklying Single Knee to Chest Stretch  - 2-4 x daily - 7 x weekly - 1 sets - 3 reps - 15 seconds hold - Supine Double Knee to Chest Modified  - 2-4 x daily - 7 x weekly - 1 sets - 3 reps - 15 seconds hold - Supine Hamstring Stretch  - 2-4 x daily - 7 x weekly - 1 sets - 3 reps - 15 seconds hold - Supine Lower Trunk Rotation  - 2-4 x daily - 7 x weekly - 1 sets - 3 reps - 15 seconds hold - Seated Pelvic Tilt  - 2-4 x daily - 7 x weekly - 1-3 sets - 10 reps - 5 seconds hold   Access Code: [email or web address] URL: [email or web address] Date: 06/06/2022 Prepared by: Jamey Reas  Exercises - Sit to Stand  - 3 x daily - 7 x weekly - 1 sets - 10 reps - Seated Straight Leg Heel Taps  - 1-2 x daily - 7 x weekly - 1-2 sets - 10 reps - Tandem Stance  - 1-2 x daily - 7 x weekly - 1 sets - 1-2 reps -  30- 60 sec hold - Carioca with Counter Support  - 1-2 x daily - 7 x weekly - 1 sets - 10 reps - Standing hip extension alternating legs  - 1-2 x daily - 7 x weekly - 1 sets - 10 reps - 5 seconds hold - Standing hip abduction alternating legs  - 1-2 x daily - 7 x weekly - 1 sets - 10 reps - 5 seconds hold - Standing March with Counter Support  - 1-2 x daily - 7 x weekly - 1 sets - 10 reps - 5 seconds hold  ASSESSMENT:  CLINICAL IMPRESSION: Patient is improving her balance reactions with PT activities.  Patient reports improved balance  with ADLS but her pain increases when she returns to previous activities.  She appears to understand PT recommendation for ADLs like vacuuming, sweeping, etc.  Pt continues to benefit from skilled PT.   OBJECTIVE IMPAIRMENTS Abnormal gait, decreased activity tolerance, decreased balance, decreased endurance, decreased mobility, difficulty walking, decreased ROM, decreased strength, postural dysfunction, and pain.   ACTIVITY LIMITATIONS carrying, lifting, bending, squatting, stairs, toileting, dressing, locomotion level, and caring for others  PARTICIPATION LIMITATIONS: meal prep, cleaning, laundry, driving, community activity, and yard work  [place], Time since onset of injury/illness/exacerbation, and 3+ comorbidities: see PMH  are also affecting patient's functional outcome.   REHAB POTENTIAL: Good  CLINICAL DECISION MAKING: Evolving/moderate complexity  EVALUATION COMPLEXITY: Low   GOALS: Goals reviewed with patient? Yes  SHORT TERM GOALS: Target date: 08/10/22  Patient will be independent with updated HEP to continue progressing outside of clinic. Baseline: see objective data Goal status: ongoing 07/19/2022  2.  Patient able to recover balance within 1 step for step strategy testing anterior, posterior & laterally. Baseline: see objective data Goal status: set 07/19/2022   UPDATED LONG TERM GOALS: Target date: 08/30/22  Patient will score FOTO >/= 55% to reflect improved self-perceived functional ability. Baseline: see objective data Goal status: on going - assessed 07/31/2022   2.  Patient scores above a 25/30 on the FGA to represent low fall risk with gait activities. Baseline: see objective data Goal status: on going - assessed 07/31/2022  3.  Patient reports </= 2/10 back and knee pain with standing, gait, and stair activity. Baseline: see objective data Goal status: on going - assessed 110/23/2023  4.  Patient Rt knee seated LAQ to -2* to  represent improved knee range and quadriceps strength. Baseline: see objective data Goal status: ongoing assessed 07/31/2022  5.  Patient safely ambulates 500' including ramp, curb, and stairs independently to allow for household and community mobility. Baseline: see objective data Goal status: met 07/31/2022  6.  Patient verbalizes and demonstrates ability to perform ADLs, household chores, and yardwork. Baseline: see objective data Goal status: on going 07/31/2022  PLAN: PT FREQUENCY: 2x/week  PT DURATION: 6 weeks  PLANNED INTERVENTIONS: Therapeutic exercises, Therapeutic activity, Neuromuscular re-education, Balance training, Gait training, Patient/Family education, Self Care, Joint mobilization, Stair training, Vestibular training, DME instructions, Dry Needling, Electrical stimulation, Spinal mobilization, Cryotherapy, Moist heat, Ultrasound, Ionotophoresis 38m/ml Dexamethasone, Manual therapy, Re-evaluation, and physical performance tests .  PLAN FOR NEXT SESSION: continue work towards LTGs with work on balance reactions including ankle, hip & step strategy activities, standing & gait for balance.   progress tolerance to functional activities.    RJamey Reas PT, DPT 08/08/2022, 3:46 PM

## 2022-08-09 ENCOUNTER — Ambulatory Visit (INDEPENDENT_AMBULATORY_CARE_PROVIDER_SITE_OTHER): Payer: Medicare Other | Admitting: Physical Therapy

## 2022-08-09 ENCOUNTER — Encounter: Payer: Self-pay | Admitting: Physical Therapy

## 2022-08-09 DIAGNOSIS — M5459 Other low back pain: Secondary | ICD-10-CM | POA: Diagnosis not present

## 2022-08-09 DIAGNOSIS — M6281 Muscle weakness (generalized): Secondary | ICD-10-CM

## 2022-08-09 DIAGNOSIS — M25561 Pain in right knee: Secondary | ICD-10-CM

## 2022-08-09 DIAGNOSIS — R2689 Other abnormalities of gait and mobility: Secondary | ICD-10-CM

## 2022-08-09 DIAGNOSIS — R2681 Unsteadiness on feet: Secondary | ICD-10-CM | POA: Diagnosis not present

## 2022-08-09 DIAGNOSIS — M25661 Stiffness of right knee, not elsewhere classified: Secondary | ICD-10-CM

## 2022-08-09 NOTE — Therapy (Signed)
OUTPATIENT PHYSICAL THERAPY TREATMENT Patient Name: Katie Weber MRN: [id number] DOB:1946/04/07, 76 y.o., female Today's Date: 08/09/2022  END OF SESSION:  PT End of Session - 08/09/22 1259     Visit Number 19    Number of Visits 25    Date for PT Re-Evaluation 08/30/22    Authorization Type Medicare Part A and B    PT Start Time 1259    PT Stop Time 1345    PT Time Calculation (min) 46 min    Equipment Utilized During Treatment Gait belt    Activity Tolerance Patient tolerated treatment well    Behavior During Therapy WFL for tasks assessed/performed                 Past Medical History:  Diagnosis Date   Arthritis    arthritis fingers   Coronary artery calcification 04/24/2017   Diverticulitis    x2 -last 1'17 tx. outpatient.   Environmental and seasonal allergies    2014 Possibly related to air freshners.   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   Family history of anesthesia complication 35TDD ago   daughter had an allergic reaction to atropine   GERD (gastroesophageal reflux disease)    History of hiatal hernia 09/14/2014   History of kidney stones    '80's lithotripsy-[facility] x1   Hyperlipidemia    Hypothyroidism    Pneumonia 2014   Pure hypercholesterolemia    Tremor of left hand    Past Surgical History:  Procedure Laterality Date   BALLOON DILATION N/A 05/04/2014   Procedure: BALLOON DILATION;  Surgeon: Garlan Fair, MD;  Location: [facility];  Service: Endoscopy;  Laterality: N/A;   BREAST LUMPECTOMY Right 1972   benign    BREAST SURGERY Right    '72 -benign tumor   childbirth- NVD x2      CHOLECYSTECTOMY N/A 03/10/2022   Procedure: LAPAROSCOPIC CHOLECYSTECTOMY;  Surgeon: Jovita Kussmaul, MD;  Location: [facility];  Service: General;  Laterality: N/A;   COLONOSCOPY WITH PROPOFOL N/A 05/04/2014   Procedure: COLONOSCOPY WITH PROPOFOL;  Surgeon: Garlan Fair, MD;  Location: [facility];  Service: Endoscopy;  Laterality: N/A;   DILATION AND CURETTAGE OF UTERUS     04-20-14 [facility] endometrial polyp removed.   ESOPHAGOGASTRODUODENOSCOPY (EGD) WITH PROPOFOL N/A 05/04/2014   Procedure: ESOPHAGOGASTRODUODENOSCOPY (EGD) WITH PROPOFOL;  Surgeon: Garlan Fair, MD;  Location: [facility];  Service: Endoscopy;  Laterality: N/A;. Procedure done in [place], [place]- 5 yrs ago.   HYSTEROSCOPY WITH D & C N/A 04/20/2014   Procedure: DILATATION AND CURETTAGE /HYSTEROSCOPY;  Surgeon: [name], MD;  Location: [facility];  Service: Gynecology;  Laterality: N/A;   IR PERC CHOLECYSTOSTOMY  01/08/2022   IR RADIOLOGIST EVAL & MGMT  02/10/2022   LAPAROSCOPY N/A 03/14/2022   Procedure: LAPAROSCOPY DIAGNOSTIC  WITH IRRIGATION AND  DRAINAGE OF Lucia Bitter;  Surgeon: Jovita Kussmaul, MD;  Location: [facility];  Service: General;  Laterality: N/A;   LITHOTRIPSY     POLYPECTOMY  2020   Dr. Lear Ng   TOTAL KNEE ARTHROPLASTY Left 02/15/2021   Procedure: LEFT TOTAL KNEE ARTHROPLASTY;  Surgeon: Mcarthur Rossetti, MD;  Location: [facility];  Service: Orthopedics;  Laterality: Left;   TOTAL KNEE ARTHROPLASTY Right 08/09/2021   Procedure: RIGHT TOTAL KNEE ARTHROPLASTY;  Surgeon: Mcarthur Rossetti, MD;  Location: [facility];  Service: Orthopedics;  Laterality: Right;  Needs  RNFA   TUBAL LIGATION     Patient Active Problem List   Diagnosis Date Noted   Hypovolemia due to dehydration 03/15/2022   AKI (acute kidney injury) ([place]) 03/15/2022   Biloma following surgery 03/14/2022   Abdominal pain 03/13/2022   Postoperative pain 03/13/2022   Acute cholecystitis 28/76/8115   Biliary colic 72/62/0355   New onset atrial flutter ([place]) 01/07/2022   DJD (degenerative joint disease) of knee 08/09/2021   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   Unilateral primary osteoarthritis, right knee 06/06/2021   Status post total left knee replacement 02/15/2021   Unilateral primary  osteoarthritis, left knee 01/04/2021   Coronary artery calcification 04/24/2017   Hyperlipidemia 04/24/2017   Acute bronchitis 09/29/2014   SBO (small bowel obstruction) ([place]) 09/27/2014   Nausea and vomiting 09/27/2014   Hypothyroidism 09/27/2014   Leukocytosis 09/27/2014    PCP: Leeroy Cha, MD  REFERRING PROVIDER: Jovita Kussmaul, MD  REFERRING DIAG: T81.89XA,K66.8 (ICD-10-CM) - Biloma following surgery, initial encounter G89.18 (ICD-10-CM) - Postoperative pain  Rationale for Evaluation and Treatment Rehabilitation  THERAPY DIAG:  Unsteadiness on feet  Other abnormalities of gait and mobility  Muscle weakness (generalized)  Other low back pain  Acute pain of right knee  Stiffness of right knee, not elsewhere classified  ONSET DATE: 03/17/22 Referral  SUBJECTIVE:                                                                                                                                                                                           SUBJECTIVE STATEMENT: Her back & knee seem better today.     PERTINENT HISTORY:  Cholecystectomy 03/10/22, laparoscopy 03/14/22 TKA Lt 5/22 Rt 11/22, hypothyroidism, hyperlipidemia, OA  PAIN:  NPRS scale: 2-3/10 since last PT lowest 1/10 & highest 5/10 Pain location:back upper lumbar & lower thoracic Pain description: dull achy Aggravating factors: standing Relieving factors:  walking & flexing forward   NPRS scale:  2-3/10, since last PT lowest 0/10 & highest 6/10 Pain location: Rt anterior-lateral knee and patella Pain description: soreness Aggravating factors: first getting up in the morning or initially moving after knee stiffening, bending Relieving factors: moving  PRECAUTIONS: None  WEIGHT BEARING RESTRICTIONS No  FALLS:  Has patient fallen in last 6 months? No  LIVING ENVIRONMENT: Lives with: lives with their spouse Lives in: House/apartment Stairs: Yes: Internal: 13 steps; on left going up and  External: 2 steps; can reach both Has following equipment at home: Single point cane, Walker - 2 wheeled, Manufacturing engineer  OCCUPATION: retired, for leisure she enjoys hiking  PLOF: [place] return to activity  level prior to surgery without pain   OBJECTIVE:   PATIENT SURVEYS:  08/02/2022:  FOTO 57% 07/17/2022:  FOTO 50% 06/20/2022 FOTO 53% 05/29/2022 FOTO  44%, target 55%  COGNITION: 05/29/2022 Overall cognitive status: Within functional limits for tasks assessed    SENSATION: 05/29/2022 Not tested  POSTURE:  05/29/2022 flexed trunk  and weight shift left  PALPATION: 06/20/2022 - tenderness to palpation at distal IT band, no tenderness at joint line  LUMBAR ROM:   Active AROM  05/29/2022  Flexion   Extension   Right lateral flexion   Left lateral flexion   Right rotation   Left rotation    (Blank rows = not tested)  LOWER EXTREMITY ROM:     ROM (A: AROM, P: PROM) Right Eval 05/29/2022 Left Eval 05/29/2022 Right 06/20/22 Right 07/19/22  Hip flexion      Hip extension      Hip abduction      Hip adduction      Hip internal rotation      Hip external rotation      Knee flexion      Knee extension Seated LAQ A: -8* Seated LAQ WNL Seated  LAQ A: -2* Seated LAQ -2*  Ankle dorsiflexion      Ankle plantarflexion      Ankle inversion      Ankle eversion       (Blank rows = not tested)  LOWER EXTREMITY MMT:    MMT Right Eval 05/29/2022 Left Eval 05/29/2022 Right 06/20/22 Left  06/20/22 Right 07/31/2022 Left 07/31/2022  Hip flexion        Hip extension        Hip abduction        Hip adduction        Hip internal rotation        Hip external rotation        Knee flexion Seated  4/5 painful Seated  4/5 Seated HH Dynameter 35.8# & 36.7# RLE:LLE 86.7% Seated HH Dynameter 40.8# & 43.1#    Knee extension Seated  4/5 painful Seated  5/5 Seated HH Dynameter 52.9# & 54.1# RLE:LLE 99.7% Seated HH dynameter 55.9# & 52.4# 5/5 64.9,  65.1 lbs 5/5 57.7, 62.6 lbs  Ankle dorsiflexion        Ankle plantarflexion        Ankle inversion        Ankle eversion         (Blank rows = not tested)  FUNCTIONAL TESTS:  08/09/2022 Assessment of step strategy: anterior within one step BLEs, posterior within one step RLE but 2 steps LLE, laterally to both right & Left steps with incorrect LE initial step with balance loss requiring PT assist.   07/17/2022 Functional Gait Assessment: 23/30   06/20/2022 Functional gait assessment: 21/30   05/29/2022 5 times sit to stand: 11.38 sec, no UE use  (cutoff for older adults is <12sec)  Functional gait assessment: 11/30  [facility] PT Assessment - 05/29/22 0001                Functional Gait  Assessment    Gait assessed  Yes     Gait Level Surface Walks 20 ft, slow speed, abnormal gait pattern, evidence for imbalance or deviates 10-15 in outside of the 12 in walkway width. Requires more than 7 sec to ambulate 20 ft.     Change in Gait Speed Makes only minor adjustments to walking speed, or accomplishes a change in speed with significant gait  deviations, deviates 10-15 in outside the 12 in walkway width, or changes speed but loses balance but is able to recover and continue walking.     Gait with Horizontal Head Turns Performs head turns with moderate changes in gait velocity, slows down, deviates 10-15 in outside 12 in walkway width but recovers, can continue to walk.     Gait with Vertical Head Turns Performs task with moderate change in gait velocity, slows down, deviates 10-15 in outside 12 in walkway width but recovers, can continue to walk.     Gait and Pivot Turn Pivot turns safely in greater than 3 sec and stops with no loss of balance, or pivot turns safely within 3 sec and stops with mild imbalance, requires small steps to catch balance.     Step Over Obstacle Is able to step over one shoe box (4.5 in total height) but must slow down and adjust steps to clear box safely. May require verbal  cueing.     Gait with Narrow Base of Support Ambulates less than 4 steps heel to toe or cannot perform without assistance.     Gait with Eyes Closed Walks 20 ft, slow speed, abnormal gait pattern, evidence for imbalance, deviates 10-15 in outside 12 in walkway width. Requires more than 9 sec to ambulate 20 ft.     Ambulating Backwards Walks 20 ft, uses assistive device, slower speed, mild gait deviations, deviates 6-10 in outside 12 in walkway width.     Steps Two feet to a stair, must use rail.     Total Score 11        GAIT: 07/17/2022: Pt amb without device  Gait velocity:  comfortable 4.03 ft/sec  fast pace 4.89 ft/sec   06/16/2022:  Independent ambulation into and through clinic between activity.  Less occurrences of lateral instability but still noted on occasion in stance phase SL for both LE.   05/29/2022 Distance walked: 200' Assistive device utilized: None Level of assistance: SBA Comments: shortened step length bil., flexed trunk, some M-L instability and near losses of balance  TODAY'S TREATMENT  08/09/2022 Neuromuscular Re-ed: See functional testing above for step strategy testing. Balance on foam beam: anticipatory stepping off beam & catching balance, progressed to reactionary with sudden removal of resistance.  Resistance band gait with static hold and eccentric motion: forward against resistance then hold for 5 sec, followed by walking backwards with resistance;  backward, right & left same motions. Blue theraband at pelvic level: walking forward, backward, right & left against resistance and return controlling speed & balance resisting band pulling her back to attachment point.  Standing with blue theraband resistance facilitating step strategy: posteriorly, anteriorly & laterally right/left. Stand on one leg with band pulling and looking / reaching so band pulls her off balance requiring step to catch balance.  She required PT assistance to prevent fall & took multiple  steps at times to catch balance.   08/08/2022 Neuromuscular Re-ed: PT demo & verbal cue on weight shift between LEs for push / pull. Pt able to perform against resistance with PT suddenly releasing resistance requiring step strategy.  Activity stepping from 8" box onto rocker board (progressed from square double pivot to round single pivot) to floor and other side of box onto foam mat working on transitioning between different surfaces including unstable that will tilt, solid & compliant surfaces. Pt performed initially with UE support //bars and progressed to intermittent touch. Catching & throwing ball with forward & backward step. Progressed from  regular ball to 3kg ball.  PT educated on standing balance challenge with 4 foot positions (easiest feet hip width, 2.feet together 3.modified tandem with ea LE in back 4.tandem with ea LE in back), eyes closed, compliant surface, crossways compliant beam with toes/heels over edge and head motions 4 directions.  Static tandem on beam eyes open. Static crossways on beam eyes open feet together. Static Crossways on beam eyes closed feet ~2" apart. Crossways on beam hip width eyes open head motions.  Tandem on floor eyes closed static.  Pt verbalizes how to vary components to challenge her balance.     08/02/2022 Neuromuscular Re-education: Rocker board with double pivot with light BUE support 1 min 2 reps ant/level/post and right/level/left Sit to/from stand from chair without armrests using UEs to push with feet on foam mat requiring increased stabilization 10 reps.  Therapeutic Exercise Step up, over & down with BOSU round side up leading with BLEs 10 reps ea with light BUE support. Lateral step up with forward & back crossovers BOSU round side up BLEs 10 reps ea with light BUE support. Standing at door frame reaching overhead single & BUEs 2 reps ea 2 deep breath hold.  Pt reports it calmed her back down which had increased pain with PT activities.     HOME EXERCISE PROGRAM: Access Code: [email or web address] URL: [email or web address] Date: 07/26/2022 Prepared by: Jamey Reas  Exercises - Seated Hamstring Stretch  - 1 x daily - 7 x weekly - 1 sets - 3 reps - 30 seconds hold - Seated Long Arc Quad  - 1 x daily - 5 x weekly - 1 sets - 10 reps - 5 seconds hold - Sitting Knee Extension with Resistance  - 1 x daily - 3 x weekly - 1 sets - 10 reps - 5 seconds hold - Sit to Stand  - 1 x daily - 3 x weekly - 1 sets - 10 reps - 5 seconds hold - Seated Hamstring Curl with Anchored Resistance  - 1 x daily - 3 x weekly - 1 sets - 10 reps - 5 seconds hold - Standing Terminal Knee Extension with Resistance  - 1 x daily - 3 x weekly - 1 sets - 10 reps - 5 seconds hold - Single Leg Stance  - 1 x daily - 3 x weekly - 1 sets - 10 reps - 5 seconds hold - Gastroc Stretch on Step  - 1 x daily - 7 x weekly - 1 sets - 3 reps - 30 seconds hold - Standing Quad Stretch with Strap  - 1 x daily - 7 x weekly - 1 sets - 3 reps - 30 seconds hold   Access Code: [email or web address] URL: [email or web address] Date: 07/13/2022 Prepared by: Jamey Reas  Exercises - Hooklying Single Knee to Chest Stretch  - 2-4 x daily - 7 x weekly - 1 sets - 3 reps - 15 seconds hold - Supine Double Knee to Chest Modified  - 2-4 x daily - 7 x weekly - 1 sets - 3 reps - 15 seconds hold - Supine Hamstring Stretch  - 2-4 x daily - 7 x weekly - 1 sets - 3 reps - 15 seconds hold - Supine Lower Trunk Rotation  - 2-4 x daily - 7 x weekly - 1 sets - 3 reps - 15 seconds hold - Seated Pelvic Tilt  - 2-4 x daily - 7 x weekly - 1-3 sets - 10 reps - 5 seconds hold   Access Code:  [email or web address] URL: [email or web address] Date: 06/06/2022 Prepared by: Jamey Reas  Exercises - Sit to Stand  - 3 x daily - 7 x weekly - 1 sets - 10 reps - Seated Straight Leg Heel Taps  - 1-2 x daily - 7 x weekly - 1-2 sets - 10 reps - Tandem Stance  - 1-2 x daily - 7 x weekly - 1 sets -  1-2 reps - 30- 60 sec hold - [place]arioca with Counter Support  - 1-2 x daily - 7 x weekly - 1 sets - 10 reps - Standing hip extension alternating legs  - 1-2 x daily - 7 x weekly - 1 sets - 10 reps - 5 seconds hold - Standing hip abduction alternating legs  - 1-2 x daily - 7 x weekly - 1 sets - 10 reps - 5 seconds hold - Standing March with Counter Support  - 1-2 x daily - 7 x weekly - 1 sets - 10 reps - 5 seconds hold  ASSESSMENT:  CLINICAL IMPRESSION: PT worked on activities to facilitate improved step strategy.  She improved in all directions. Laterally to both right & left seem to challenge her the most.  Pt continues to benefit from skilled PT.   OBJECTIVE IMPAIRMENTS Abnormal gait, decreased activity tolerance, decreased balance, decreased endurance, decreased mobility, difficulty walking, decreased ROM, decreased strength, postural dysfunction, and pain.   ACTIVITY LIMITATIONS carrying, lifting, bending, squatting, stairs, toileting, dressing, locomotion level, and caring for others  PARTICIPATION LIMITATIONS: meal prep, cleaning, laundry, driving, community activity, and yard work  [place], Time since onset of injury/illness/exacerbation, and 3+ comorbidities: see PMH  are also affecting patient's functional outcome.   REHAB POTENTIAL: Good  CLINICAL DECISION MAKING: Evolving/moderate complexity  EVALUATION COMPLEXITY: Low   GOALS: Goals reviewed with patient? Yes  SHORT TERM GOALS: Target date: 08/10/22  Patient will be independent with updated HEP to continue progressing outside of clinic. Baseline: see objective data Goal status: Met 08/09/2022  2.  Patient able to recover balance within 1 step for step strategy testing anterior, posterior & laterally. Baseline: see objective data Goal status: partially met 08/09/2022   UPDATED LONG TERM GOALS: Target date: 08/30/22  Patient will score FOTO >/= 55% to reflect improved self-perceived functional  ability. Baseline: see objective data Goal status: on going - assessed 07/31/2022   2.  Patient scores above a 25/30 on the FGA to represent low fall risk with gait activities. Baseline: see objective data Goal status: on going - assessed 07/31/2022  3.  Patient reports </= 2/10 back and knee pain with standing, gait, and stair activity. Baseline: see objective data Goal status: on going - assessed 110/23/2023  4.  Patient Rt knee seated LAQ to -2* to represent improved knee range and quadriceps strength. Baseline: see objective data Goal status: ongoing assessed 07/31/2022  5.  Patient safely ambulates 500' including ramp, curb, and stairs independently to allow for household and community mobility. Baseline: see objective data Goal status: met 07/31/2022  6.  Patient verbalizes and demonstrates ability to perform ADLs, household chores, and yardwork. Baseline: see objective data Goal status: on going 07/31/2022  PLAN: PT FREQUENCY: 2x/week  PT DURATION: 6 weeks  PLANNED INTERVENTIONS: Therapeutic exercises, Therapeutic activity, Neuromuscular re-education, Balance training, Gait training, Patient/Family education, Self Care, Joint mobilization, Stair training, Vestibular training, DME instructions, Dry Needling, Electrical stimulation, Spinal mobilization, Cryotherapy, Moist heat, Ultrasound, Ionotophoresis 24m/ml Dexamethasone, Manual therapy, Re-evaluation, and physical performance tests .  PLAN FOR NEXT SESSION: work  on step strategy with theraband to enable HEP,  continue work towards [place] with work on balance reactions including ankle, hip & step strategy activities, standing & gait for balance.   progress tolerance to functional activities.    Jamey Reas, PT, DPT 08/09/2022, 4:28 PM

## 2022-08-14 ENCOUNTER — Encounter: Payer: Self-pay | Admitting: Physical Therapy

## 2022-08-14 ENCOUNTER — Ambulatory Visit (INDEPENDENT_AMBULATORY_CARE_PROVIDER_SITE_OTHER): Payer: Medicare Other | Admitting: Physical Therapy

## 2022-08-14 DIAGNOSIS — M5459 Other low back pain: Secondary | ICD-10-CM | POA: Diagnosis not present

## 2022-08-14 DIAGNOSIS — R2689 Other abnormalities of gait and mobility: Secondary | ICD-10-CM

## 2022-08-14 DIAGNOSIS — M25561 Pain in right knee: Secondary | ICD-10-CM

## 2022-08-14 DIAGNOSIS — M6281 Muscle weakness (generalized): Secondary | ICD-10-CM | POA: Diagnosis not present

## 2022-08-14 DIAGNOSIS — R2681 Unsteadiness on feet: Secondary | ICD-10-CM

## 2022-08-14 DIAGNOSIS — M25661 Stiffness of right knee, not elsewhere classified: Secondary | ICD-10-CM

## 2022-08-14 NOTE — Therapy (Signed)
OUTPATIENT PHYSICAL THERAPY TREATMENT Patient Name: Katie Weber MRN: [id number] DOB:[birth year], 76 y.o., female Today's Date: 08/14/2022  END OF SESSION:  PT End of Session - 08/14/22 1257     Visit Number 20    Number of Visits 25    Date for PT Re-Evaluation 08/30/22    Authorization Type Medicare Part A and B    PT Start Time 1257    PT Stop Time 1340    PT Time Calculation (min) 43 min    Equipment Utilized During Treatment Gait belt    Activity Tolerance Patient tolerated treatment well    Behavior During Therapy WFL for tasks assessed/performed                 Past Medical History:  Diagnosis Date   Arthritis    arthritis fingers   Coronary artery calcification 04/24/2017   Diverticulitis    x2 -last 1'17 tx. outpatient.   Environmental and seasonal allergies    2014 Possibly related to air freshners.   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   Family history of anesthesia complication 78EUM ago   daughter had an allergic reaction to atropine   GERD (gastroesophageal reflux disease)    History of hiatal hernia 09/14/2014   History of kidney stones    '80's lithotripsy-[facility] x1   Hyperlipidemia    Hypothyroidism    Pneumonia 2014   Pure hypercholesterolemia    Tremor of left hand    Past Surgical History:  Procedure Laterality Date   BALLOON DILATION N/A 05/04/2014   Procedure: BALLOON DILATION;  Surgeon: Garlan Fair, MD;  Location: [facility];  Service: Endoscopy;  Laterality: N/A;   BREAST LUMPECTOMY Right 1972   benign    BREAST SURGERY Right    '72 -benign tumor   childbirth- NVD x2      CHOLECYSTECTOMY N/A 03/10/2022   Procedure: LAPAROSCOPIC CHOLECYSTECTOMY;  Surgeon: Jovita Kussmaul, MD;  Location: [place];  Service: General;  Laterality: N/A;   COLONOSCOPY WITH PROPOFOL N/A 05/04/2014   Procedure: COLONOSCOPY WITH PROPOFOL;  Surgeon: Garlan Fair, MD;  Location: [facility];  Service: Endoscopy;  Laterality: N/A;   DILATION AND CURETTAGE OF UTERUS     04-20-14 [facility] endometrial polyp removed.   ESOPHAGOGASTRODUODENOSCOPY (EGD) WITH PROPOFOL N/A 05/04/2014   Procedure: ESOPHAGOGASTRODUODENOSCOPY (EGD) WITH PROPOFOL;  Surgeon: Garlan Fair, MD;  Location: [facility];  Service: Endoscopy;  Laterality: N/A;. Procedure done in [place], [place]- 5 yrs ago.   HYSTEROSCOPY WITH D & C N/A 04/20/2014   Procedure: DILATATION AND CURETTAGE /HYSTEROSCOPY;  Surgeon: [name], MD;  Location: [facility];  Service: Gynecology;  Laterality: N/A;   IR PERC CHOLECYSTOSTOMY  01/08/2022   IR RADIOLOGIST EVAL & MGMT  02/10/2022   LAPAROSCOPY N/A 03/14/2022   Procedure: LAPAROSCOPY DIAGNOSTIC  WITH IRRIGATION AND  DRAINAGE OF Lucia Bitter;  Surgeon: Jovita Kussmaul, MD;  Location: [place];  Service: General;  Laterality: N/A;   LITHOTRIPSY     POLYPECTOMY  2020   Dr. Lear Ng   TOTAL KNEE ARTHROPLASTY Left 02/15/2021   Procedure: LEFT TOTAL KNEE ARTHROPLASTY;  Surgeon: Mcarthur Rossetti, MD;  Location: [facility];  Service: Orthopedics;  Laterality: Left;   TOTAL KNEE ARTHROPLASTY Right 08/09/2021   Procedure: RIGHT TOTAL KNEE ARTHROPLASTY;  Surgeon: Mcarthur Rossetti, MD;  Location: [place];  Service: Orthopedics;  Laterality: Right;  Needs  RNFA   TUBAL LIGATION     Patient Active Problem List   Diagnosis Date Noted   Hypovolemia due to dehydration 03/15/2022   AKI (acute kidney injury) ([place]) 03/15/2022   Biloma following surgery 03/14/2022   Abdominal pain 03/13/2022   Postoperative pain 03/13/2022   Acute cholecystitis 89[birth year]1941   Biliary colic 74/05/1447   New onset atrial flutter ([place]) 01/07/2022   DJD (degenerative joint disease) of knee 08/09/2021   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   Unilateral primary osteoarthritis, right knee 06/06/2021   Status post total left knee replacement 02/15/2021   Unilateral primary  osteoarthritis, left knee 01/04/2021   Coronary artery calcification 04/24/2017   Hyperlipidemia 04/24/2017   Acute bronchitis 09/29/2014   SBO (small bowel obstruction) ([place]) 09/27/2014   Nausea and vomiting 09/27/2014   Hypothyroidism 09/27/2014   Leukocytosis 09/27/2014    PCP: Leeroy Cha, MD  REFERRING PROVIDER: Jovita Kussmaul, MD  REFERRING DIAG: T81.89XA,K66.8 (ICD-10-CM) - Biloma following surgery, initial encounter G89.18 (ICD-10-CM) - Postoperative pain  Rationale for Evaluation and Treatment Rehabilitation  THERAPY DIAG:  Unsteadiness on feet  Other abnormalities of gait and mobility  Muscle weakness (generalized)  Other low back pain  Acute pain of right knee  Stiffness of right knee, not elsewhere classified  ONSET DATE: 03/17/22 Referral  SUBJECTIVE:      She had to get down on her knees to get cat and her knees were sore.    She was sore after last PT session.                                                                                                                                                                                   PERTINENT HISTORY:  Cholecystectomy 03/10/22, laparoscopy 03/14/22 TKA Lt 5/22 Rt 11/22, hypothyroidism, hyperlipidemia, OA  PAIN:  NPRS scale: 2/10 since last PT lowest 1/10 & highest 5/10 Pain location:back upper lumbar & lower thoracic Pain description: dull achy Aggravating factors: standing Relieving factors:  walking & flexing forward   NPRS scale:  3/10, since last PT lowest 0/10 & highest 3/10 Pain location: Rt anterior-lateral knee and patella Pain description: soreness Aggravating factors: first getting up in the morning or initially moving after knee stiffening, bending Relieving factors: moving  PRECAUTIONS: None  WEIGHT BEARING RESTRICTIONS No  FALLS:  Has patient fallen in last 6 months? No  LIVING ENVIRONMENT: Lives with: lives with their spouse Lives in: House/apartment Stairs: Yes:  Internal: 13 steps; on left going up and External: 2 steps; can reach both Has following equipment at home: Single point cane, Walker - 2 wheeled, and Electronics engineer  OCCUPATION: retired, for leisure she enjoys  hiking  PLOF: Independent  PATIENT GOALS return to activity level prior to surgery without pain   OBJECTIVE:   PATIENT SURVEYS:  08/02/2022:  FOTO 57% 07/17/2022:  FOTO 50% 06/20/2022 FOTO 53% 05/29/2022 FOTO  44%, target 55%  COGNITION: 05/29/2022 Overall cognitive status: Within functional limits for tasks assessed    SENSATION: 05/29/2022 Not tested  POSTURE:  05/29/2022 flexed trunk  and weight shift left  PALPATION: 06/20/2022 - tenderness to palpation at distal IT band, no tenderness at joint line  LUMBAR ROM:   Active AROM  05/29/2022  Flexion   Extension   Right lateral flexion   Left lateral flexion   Right rotation   Left rotation    (Blank rows = not tested)  LOWER EXTREMITY ROM:     ROM (A: AROM, P: PROM) Right Eval 05/29/2022 Left Eval 05/29/2022 Right 06/20/22 Right 07/19/22  Hip flexion      Hip extension      Hip abduction      Hip adduction      Hip internal rotation      Hip external rotation      Knee flexion      Knee extension Seated LAQ A: -8* Seated LAQ WNL Seated  LAQ A: -2* Seated LAQ -2*  Ankle dorsiflexion      Ankle plantarflexion      Ankle inversion      Ankle eversion       (Blank rows = not tested)  LOWER EXTREMITY MMT:    MMT Right Eval 05/29/2022 Left Eval 05/29/2022 Right 06/20/22 Left  06/20/22 Right 07/31/2022 Left 07/31/2022  Hip flexion        Hip extension        Hip abduction        Hip adduction        Hip internal rotation        Hip external rotation        Knee flexion Seated  4/5 painful Seated  4/5 Seated HH Dynameter 35.8# & 36.7# RLE:LLE 86.7% Seated HH Dynameter 40.8# & 43.1#    Knee extension Seated  4/5 painful Seated  5/5 Seated HH Dynameter 52.9# & 54.1# RLE:LLE 99.7%  Seated HH dynameter 55.9# & 52.4# 5/5 64.9, 65.1 lbs 5/5 57.7, 62.6 lbs  Ankle dorsiflexion        Ankle plantarflexion        Ankle inversion        Ankle eversion         (Blank rows = not tested)  FUNCTIONAL TESTS:  08/09/2022 Assessment of step strategy: anterior within one step BLEs, posterior within one step RLE but 2 steps LLE, laterally to both right & Left steps with incorrect LE initial step with balance loss requiring PT assist.   07/17/2022 Functional Gait Assessment: 23/30   06/20/2022 Functional gait assessment: 21/30   05/29/2022 5 times sit to stand: 11.38 sec, no UE use  (cutoff for older adults is <12sec)  Functional gait assessment: 11/30  [facility] PT Assessment - 05/29/22 0001                Functional Gait  Assessment    Gait assessed  Yes     Gait Level Surface Walks 20 ft, slow speed, abnormal gait pattern, evidence for imbalance or deviates 10-15 in outside of the 12 in walkway width. Requires more than 7 sec to ambulate 20 ft.     Change in Gait Speed Makes only minor adjustments to walking  speed, or accomplishes a change in speed with significant gait deviations, deviates 10-15 in outside the 12 in walkway width, or changes speed but loses balance but is able to recover and continue walking.     Gait with Horizontal Head Turns Performs head turns with moderate changes in gait velocity, slows down, deviates 10-15 in outside 12 in walkway width but recovers, can continue to walk.     Gait with Vertical Head Turns Performs task with moderate change in gait velocity, slows down, deviates 10-15 in outside 12 in walkway width but recovers, can continue to walk.     Gait and Pivot Turn Pivot turns safely in greater than 3 sec and stops with no loss of balance, or pivot turns safely within 3 sec and stops with mild imbalance, requires small steps to catch balance.     Step Over Obstacle Is able to step over one shoe box (4.5 in total height) but must slow down and adjust  steps to clear box safely. May require verbal cueing.     Gait with Narrow Base of Support Ambulates less than 4 steps heel to toe or cannot perform without assistance.     Gait with Eyes Closed Walks 20 ft, slow speed, abnormal gait pattern, evidence for imbalance, deviates 10-15 in outside 12 in walkway width. Requires more than 9 sec to ambulate 20 ft.     Ambulating Backwards Walks 20 ft, uses assistive device, slower speed, mild gait deviations, deviates 6-10 in outside 12 in walkway width.     Steps Two feet to a stair, must use rail.     Total Score 11        GAIT: 07/17/2022: Pt amb without device  Gait velocity:  comfortable 4.03 ft/sec  fast pace 4.89 ft/sec   06/16/2022:  Independent ambulation into and through clinic between activity.  Less occurrences of lateral instability but still noted on occasion in stance phase SL for both LE.   05/29/2022 Distance walked: 200' Assistive device utilized: None Level of assistance: SBA Comments: shortened step length bil., flexed trunk, some M-L instability and near losses of balance  TODAY'S TREATMENT  08/14/2022 Neuromuscular Re-ed:  Blue theraband at pelvic level: walking forward, backward, right & left against resistance and return controlling speed & balance resisting band pulling her back to attachment point.  Standing with blue theraband resistance facilitating step strategy: posteriorly, anteriorly & laterally right/left. Stand on one leg with band pulling and looking / reaching so band pulls her off balance requiring step to catch balance.   Standing hip stepping with green theraband abd, flex,  add & ext BLEs 10 reps with supervision for balance & verbal cues on technique. Back stretch at door frame reaching single UE & BUEs overhead with deep breaths. Pt reports that it relaxed back after above exercises challenged her.    Therapeutic Activities: PT demo & verbal cues on modified lunge between 2 chairs pt performed 5 reps  leading with ea LE. PT demo & verbal cues on floor transfer onto pillow to protect knees pushing UEs on chair bottom. Pt return demo understanding leading with ea LE.   08/09/2022 Neuromuscular Re-ed: See functional testing above for step strategy testing. Balance on foam beam: anticipatory stepping off beam & catching balance, progressed to reactionary with sudden removal of resistance.  Resistance band gait with static hold and eccentric motion: forward against resistance then hold for 5 sec, followed by walking backwards with resistance;  backward, right & left same  motions. Blue theraband at pelvic level: walking forward, backward, right & left against resistance and return controlling speed & balance resisting band pulling her back to attachment point.  Standing with blue theraband resistance facilitating step strategy: posteriorly, anteriorly & laterally right/left. Stand on one leg with band pulling and looking / reaching so band pulls her off balance requiring step to catch balance.  She required PT assistance to prevent fall & took multiple steps at times to catch balance.   08/08/2022 Neuromuscular Re-ed: PT demo & verbal cue on weight shift between LEs for push / pull. Pt able to perform against resistance with PT suddenly releasing resistance requiring step strategy.  Activity stepping from 8" box onto rocker board (progressed from square double pivot to round single pivot) to floor and other side of box onto foam mat working on transitioning between different surfaces including unstable that will tilt, solid & compliant surfaces. Pt performed initially with UE support //bars and progressed to intermittent touch. Catching & throwing ball with forward & backward step. Progressed from regular ball to 3kg ball.  PT educated on standing balance challenge with 4 foot positions (easiest feet hip width, 2.feet together 3.modified tandem with ea LE in back 4.tandem with ea LE in back), eyes  closed, compliant surface, crossways compliant beam with toes/heels over edge and head motions 4 directions.  Static tandem on beam eyes open. Static crossways on beam eyes open feet together. Static Crossways on beam eyes closed feet ~2" apart. Crossways on beam hip width eyes open head motions.  Tandem on floor eyes closed static.  Pt verbalizes how to vary components to challenge her balance.    HOME EXERCISE PROGRAM: Access Code: [email or web address] URL: [email or web address] Date: 07/26/2022 Prepared by: Jamey Reas  Exercises - Seated Hamstring Stretch  - 1 x daily - 7 x weekly - 1 sets - 3 reps - 30 seconds hold - Seated Long Arc Quad  - 1 x daily - 5 x weekly - 1 sets - 10 reps - 5 seconds hold - Sitting Knee Extension with Resistance  - 1 x daily - 3 x weekly - 1 sets - 10 reps - 5 seconds hold - Sit to Stand  - 1 x daily - 3 x weekly - 1 sets - 10 reps - 5 seconds hold - Seated Hamstring Curl with Anchored Resistance  - 1 x daily - 3 x weekly - 1 sets - 10 reps - 5 seconds hold - Standing Terminal Knee Extension with Resistance  - 1 x daily - 3 x weekly - 1 sets - 10 reps - 5 seconds hold - Single Leg Stance  - 1 x daily - 3 x weekly - 1 sets - 10 reps - 5 seconds hold - Gastroc Stretch on Step  - 1 x daily - 7 x weekly - 1 sets - 3 reps - 30 seconds hold - Standing Quad Stretch with Strap  - 1 x daily - 7 x weekly - 1 sets - 3 reps - 30 seconds hold   Access Code: [email or web address] URL: [email or web address] Date: 07/13/2022 Prepared by: Jamey Reas  Exercises - Hooklying Single Knee to Chest Stretch  - 2-4 x daily - 7 x weekly - 1 sets - 3 reps - 15 seconds hold - Supine Double Knee to Chest Modified  - 2-4 x daily - 7 x weekly - 1 sets - 3 reps - 15 seconds hold - Supine Hamstring Stretch  - 2-4 x daily - 7 x  weekly - 1 sets - 3 reps - 15 seconds hold - Supine Lower Trunk Rotation  - 2-4 x daily - 7 x weekly - 1 sets - 3 reps - 15 seconds hold - Seated Pelvic  Tilt  - 2-4 x daily - 7 x weekly - 1-3 sets - 10 reps - 5 seconds hold   Access Code: [email or web address] URL: [email or web address] Date: 06/06/2022 Prepared by: Jamey Reas  Exercises - Sit to Stand  - 3 x daily - 7 x weekly - 1 sets - 10 reps - Seated Straight Leg Heel Taps  - 1-2 x daily - 7 x weekly - 1-2 sets - 10 reps - Tandem Stance  - 1-2 x daily - 7 x weekly - 1 sets - 1-2 reps - 30- 60 sec hold - [place]ari[place] with Counter Support  - 1-2 x daily - 7 x weekly - 1 sets - 10 reps - Standing hip extension alternating legs  - 1-2 x daily - 7 x weekly - 1 sets - 10 reps - 5 seconds hold - Standing hip abduction alternating legs  - 1-2 x daily - 7 x weekly - 1 sets - 10 reps - 5 seconds hold - Standing March with Counter Support  - 1-2 x daily - 7 x weekly - 1 sets - 10 reps - 5 seconds hold  ASSESSMENT:  CLINICAL IMPRESSION: Continued activities to facilitate improved step strategy.  She improved in all directions. Patient appears to understand how to perform floor transfers.   Pt continues to benefit from skilled PT.   OBJECTIVE IMPAIRMENTS Abnormal gait, decreased activity tolerance, decreased balance, decreased endurance, decreased mobility, difficulty walking, decreased ROM, decreased strength, postural dysfunction, and pain.   ACTIVITY LIMITATIONS carrying, lifting, bending, squatting, stairs, toileting, dressing, locomotion level, and caring for others  PARTICIPATION LIMITATIONS: meal prep, cleaning, laundry, driving, community activity, and yard work  [place], Time since onset of injury/illness/exacerbation, and 3+ comorbidities: see PMH  are also affecting patient's functional outcome.   REHAB POTENTIAL: Good  CLINICAL DECISION MAKING: Evolving/moderate complexity  EVALUATION COMPLEXITY: Low   GOALS: Goals reviewed with patient? Yes  SHORT TERM GOALS: Target date: 08/10/22  Patient will be independent with updated HEP to continue progressing  outside of clinic. Baseline: see objective data Goal status: Met 08/09/2022  2.  Patient able to recover balance within 1 step for step strategy testing anterior, posterior & laterally. Baseline: see objective data Goal status: partially met 08/09/2022   UPDATED LONG TERM GOALS: Target date: 08/30/22  Patient will score FOTO >/= 55% to reflect improved self-perceived functional ability. Baseline: see objective data Goal status: on going - assessed 07/31/2022   2.  Patient scores above a 25/30 on the FGA to represent low fall risk with gait activities. Baseline: see objective data Goal status: on going - assessed 07/31/2022  3.  Patient reports </= 2/10 back and knee pain with standing, gait, and stair activity. Baseline: see objective data Goal status: on going - assessed 110/23/2023  4.  Patient Rt knee seated LAQ to -2* to represent improved knee range and quadriceps strength. Baseline: see objective data Goal status: ongoing assessed 07/31/2022  5.  Patient safely ambulates 500' including ramp, curb, and stairs independently to allow for household and community mobility. Baseline: see objective data Goal status: met 07/31/2022  6.  Patient verbalizes and demonstrates ability to perform ADLs, household chores, and yardwork. Baseline: see objective data Goal status: on going 07/31/2022  PLAN: PT FREQUENCY:  2x/week  PT DURATION: 6 weeks  PLANNED INTERVENTIONS: Therapeutic exercises, Therapeutic activity, Neuromuscular re-education, Balance training, Gait training, Patient/Family education, Self Care, Joint mobilization, Stair training, Vestibular training, DME instructions, Dry Needling, Electrical stimulation, Spinal mobilization, Cryotherapy, Moist heat, Ultrasound, Ionotophoresis 73m/ml Dexamethasone, Manual therapy, Re-evaluation, and physical performance tests .  PLAN FOR NEXT SESSION:   update HEP to include step strategy with theraband & standing hip with theraband,   continue work towards [place] PT, DPT 08/14/2022, 4:52 PM

## 2022-08-16 ENCOUNTER — Ambulatory Visit (INDEPENDENT_AMBULATORY_CARE_PROVIDER_SITE_OTHER): Payer: Medicare Other | Admitting: Physical Therapy

## 2022-08-16 DIAGNOSIS — R2681 Unsteadiness on feet: Secondary | ICD-10-CM

## 2022-08-16 DIAGNOSIS — R2689 Other abnormalities of gait and mobility: Secondary | ICD-10-CM

## 2022-08-16 DIAGNOSIS — M6281 Muscle weakness (generalized): Secondary | ICD-10-CM | POA: Diagnosis not present

## 2022-08-16 DIAGNOSIS — M5459 Other low back pain: Secondary | ICD-10-CM | POA: Diagnosis not present

## 2022-08-16 DIAGNOSIS — M25561 Pain in right knee: Secondary | ICD-10-CM

## 2022-08-16 NOTE — Therapy (Signed)
OUTPATIENT PHYSICAL THERAPY TREATMENT Patient Name: Katie Weber MRN: [id number] DOB:[birth year], 76 y.o., female Today's Date: 08/16/2022  END OF SESSION:  PT End of Session - 08/16/22 1259     Visit Number 21    Number of Visits 25    Date for PT Re-Evaluation 08/30/22    Authorization Type Medicare Part A and B    PT Start Time 1259    PT Stop Time 1340    PT Time Calculation (min) 41 min    Equipment Utilized During Treatment Gait belt    Activity Tolerance Patient tolerated treatment well    Behavior During Therapy WFL for tasks assessed/performed                 Past Medical History:  Diagnosis Date   Arthritis    arthritis fingers   Coronary artery calcification 04/24/2017   Diverticulitis    x2 -last 1'17 tx. outpatient.   Environmental and seasonal allergies    2014 Possibly related to air freshners.   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   Family history of anesthesia complication 16SAY ago   daughter had an allergic reaction to atropine   GERD (gastroesophageal reflux disease)    History of hiatal hernia 09/14/2014   History of kidney stones    '80's lithotripsy-[facility] x1   Hyperlipidemia    Hypothyroidism    Pneumonia 2014   Pure hypercholesterolemia    Tremor of left hand    Past Surgical History:  Procedure Laterality Date   BALLOON DILATION N/A 05/04/2014   Procedure: BALLOON DILATION;  Surgeon: Garlan Fair, MD;  Location: [facility];  Service: Endoscopy;  Laterality: N/A;   BREAST LUMPECTOMY Right 1972   benign    BREAST SURGERY Right    '72 -benign tumor   childbirth- NVD x2      CHOLECYSTECTOMY N/A 03/10/2022   Procedure: LAPAROSCOPIC CHOLECYSTECTOMY;  Surgeon: Jovita Kussmaul, MD;  Location: [facility];  Service: General;  Laterality: N/A;   COLONOSCOPY WITH PROPOFOL N/A 05/04/2014   Procedure: COLONOSCOPY WITH PROPOFOL;  Surgeon: Garlan Fair, MD;  Location: [facility];  Service: Endoscopy;  Laterality: N/A;   DILATION AND CURETTAGE OF UTERUS     04-20-14 [facility] hospital endometrial polyp removed.   ESOPHAGOGASTRODUODENOSCOPY (EGD) WITH PROPOFOL N/A 05/04/2014   Procedure: ESOPHAGOGASTRODUODENOSCOPY (EGD) WITH PROPOFOL;  Surgeon: Garlan Fair, MD;  Location: [facility];  Service: Endoscopy;  Laterality: N/A;. Procedure done in [place], [place]- 5 yrs ago.   HYSTEROSCOPY WITH D & C N/A 04/20/2014   Procedure: DILATATION AND CURETTAGE /HYSTEROSCOPY;  Surgeon: [name], MD;  Location: [facility];  Service: Gynecology;  Laterality: N/A;   IR PERC CHOLECYSTOSTOMY  01/08/2022   IR RADIOLOGIST EVAL & MGMT  02/10/2022   LAPAROSCOPY N/A 03/14/2022   Procedure: LAPAROSCOPY DIAGNOSTIC  WITH IRRIGATION AND  DRAINAGE OF Lucia Bitter;  Surgeon: Jovita Kussmaul, MD;  Location: [facility];  Service: General;  Laterality: N/A;   LITHOTRIPSY     POLYPECTOMY  2020   Dr. Lear Ng   TOTAL KNEE ARTHROPLASTY Left 02/15/2021   Procedure: LEFT TOTAL KNEE ARTHROPLASTY;  Surgeon: Mcarthur Rossetti, MD;  Location: [facility];  Service: Orthopedics;  Laterality: Left;   TOTAL KNEE ARTHROPLASTY Right 08/09/2021   Procedure: RIGHT TOTAL KNEE ARTHROPLASTY;  Surgeon: Mcarthur Rossetti, MD;  Location: [facility];  Service: Orthopedics;  Laterality: Right;  Needs  RNFA   TUBAL LIGATION     Patient Active Problem List   Diagnosis Date Noted   Hypovolemia due to dehydration 03/15/2022   AKI (acute kidney injury) ([place]) 03/15/2022   Biloma following surgery 03/14/2022   Abdominal pain 03/13/2022   Postoperative pain 03/13/2022   Acute cholecystitis 22/63/3354   Biliary colic 56/25/6389   New onset atrial flutter ([place]) 01/07/2022   DJD (degenerative joint disease) of knee 08/09/2021   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   Unilateral primary osteoarthritis, right knee 06/06/2021   Status post total left knee replacement 02/15/2021   Unilateral primary  osteoarthritis, left knee 01/04/2021   Coronary artery calcification 04/24/2017   Hyperlipidemia 04/24/2017   Acute bronchitis 09/29/2014   SBO (small bowel obstruction) ([place]) 09/27/2014   Nausea and vomiting 09/27/2014   Hypothyroidism 09/27/2014   Leukocytosis 09/27/2014    PCP: Leeroy Cha, MD  REFERRING PROVIDER: Jovita Kussmaul, MD  REFERRING DIAG: T81.89XA,K66.8 (ICD-10-CM) - Biloma following surgery, initial encounter G89.18 (ICD-10-CM) - Postoperative pain  Rationale for Evaluation and Treatment Rehabilitation  THERAPY DIAG:  Unsteadiness on feet  Other abnormalities of gait and mobility  Muscle weakness (generalized)  Other low back pain  Acute pain of right knee  ONSET DATE: 03/17/22 Referral  SUBJECTIVE:   She is starting to be able to do more things around her house.                                                                                                                                                               PERTINENT HISTORY:  Cholecystectomy 03/10/22, laparoscopy 03/14/22 TKA Lt 5/22 Rt 11/22, hypothyroidism, hyperlipidemia, OA  PAIN:  NPRS scale: 0/10 since last PT lowest 0/10 & highest 3/10 Pain location:back upper lumbar & lower thoracic Pain description: dull achy Aggravating factors: standing Relieving factors:  walking & flexing forward   NPRS scale:  1/10, since last PT lowest 0/10 & highest 3/10 Pain location: Rt anterior-lateral knee and patella Pain description: soreness Aggravating factors: first getting up in the morning or initially moving after knee stiffening, bending Relieving factors: moving  PRECAUTIONS: None  WEIGHT BEARING RESTRICTIONS No  FALLS:  Has patient fallen in last 6 months? No  LIVING ENVIRONMENT: Lives with: lives with their spouse Lives in: House/apartment Stairs: Yes: Internal: 13 steps; on left going up and External: 2 steps; can reach both Has following equipment at home: Single point  cane, Walker - 2 wheeled, Manufacturing engineer  OCCUPATION: retired, for leisure she enjoys hiking  PLOF: [place] return to activity level prior to surgery without pain   OBJECTIVE:   PATIENT SURVEYS:  08/02/2022:  FOTO 57% 07/17/2022:  FOTO 50% 06/20/2022 FOTO 53% 05/29/2022 FOTO  44%, target 55%  COGNITION: 05/29/2022  Overall cognitive status: Within functional limits for tasks assessed    POSTURE:  05/29/2022 flexed trunk  and weight shift left  PALPATION: 06/20/2022 - tenderness to palpation at distal IT band, no tenderness at joint line  LUMBAR ROM:   Active AROM  05/29/2022  Flexion   Extension   Right lateral flexion   Left lateral flexion   Right rotation   Left rotation    (Blank rows = not tested)  LOWER EXTREMITY ROM:     ROM (A: AROM, P: PROM) Right Eval 05/29/2022 Left Eval 05/29/2022 Right 06/20/22 Right 07/19/22  Hip flexion      Hip extension      Hip abduction      Hip adduction      Hip internal rotation      Hip external rotation      Knee flexion      Knee extension Seated LAQ A: -8* Seated LAQ WNL Seated  LAQ A: -2* Seated LAQ -2*  Ankle dorsiflexion      Ankle plantarflexion      Ankle inversion      Ankle eversion       (Blank rows = not tested)  LOWER EXTREMITY MMT:    MMT Right Eval 05/29/2022 Left Eval 05/29/2022 Right 06/20/22 Left  06/20/22 Right 07/31/2022 Left 07/31/2022  Hip flexion        Hip extension        Hip abduction        Hip adduction        Hip internal rotation        Hip external rotation        Knee flexion Seated  4/5 painful Seated  4/5 Seated HH Dynameter 35.8# & 36.7# RLE:LLE 86.7% Seated HH Dynameter 40.8# & 43.1#    Knee extension Seated  4/5 painful Seated  5/5 Seated HH Dynameter 52.9# & 54.1# RLE:LLE 99.7% Seated HH dynameter 55.9# & 52.4# 5/5 64.9, 65.1 lbs 5/5 57.7, 62.6 lbs  Ankle dorsiflexion        Ankle plantarflexion        Ankle inversion        Ankle  eversion         (Blank rows = not tested)  FUNCTIONAL TESTS:  08/09/2022 Assessment of step strategy: anterior within one step BLEs, posterior within one step RLE but 2 steps LLE, laterally to both right & Left steps with incorrect LE initial step with balance loss requiring PT assist.   07/17/2022 Functional Gait Assessment: 23/30  06/20/2022 Functional gait assessment: 21/30  05/29/2022 5 times sit to stand: 11.38 sec, no UE use  (cutoff for older adults is <12sec)  Functional gait assessment: 11/30   GAIT: 07/17/2022: Pt amb without device  Gait velocity:  comfortable 4.03 ft/sec  fast pace 4.89 ft/sec   06/16/2022:  Independent ambulation into and through clinic between activity.  Less occurrences of lateral instability but still noted on occasion in stance phase SL for both LE.   05/29/2022 Distance walked: 200' Assistive device utilized: None Level of assistance: SBA Comments: shortened step length bil., flexed trunk, some M-L instability and near losses of balance  TODAY'S TREATMENT  08/16/2022 Neuromuscular Re-ed:  Blue theraband at pelvic level: walking forward, backward, right & left against resistance and return controlling speed & balance resisting band pulling her back to attachment point.  Standing with blue theraband resistance facilitating step strategy: posteriorly, anteriorly & laterally right/left. Stand on one leg with band pulling and  looking / reaching so band pulls her off balance requiring step to catch balance.   Standing hip stepping with green theraband abd, flex,  add & ext BLEs 10 reps with supervision for balance & verbal cues on technique. Pt performed above between 2 chair backs and verbalized / return demo as HEP.   Back stretch at door frame reaching single UE & BUEs overhead with deep breaths. Pt reports that it relaxed back after above exercises challenged her.   Therapeutic Activities: PT reviewed modified lunge between 2 chairs pt performed 3  reps leading with ea LE. PT reviewed on floor transfer onto pillow to protect knees pushing UEs on chair bottom. Pt return demo understanding leading with ea LE.   08/14/2022 Neuromuscular Re-ed:  Blue theraband at pelvic level: walking forward, backward, right & left against resistance and return controlling speed & balance resisting band pulling her back to attachment point.  Standing with blue theraband resistance facilitating step strategy: posteriorly, anteriorly & laterally right/left. Stand on one leg with band pulling and looking / reaching so band pulls her off balance requiring step to catch balance.   Standing hip stepping with green theraband abd, flex,  add & ext BLEs 10 reps with supervision for balance & verbal cues on technique. Back stretch at door frame reaching single UE & BUEs overhead with deep breaths. Pt reports that it relaxed back after above exercises challenged her.    Therapeutic Activities: PT demo & verbal cues on modified lunge between 2 chairs pt performed 5 reps leading with ea LE. PT demo & verbal cues on floor transfer onto pillow to protect knees pushing UEs on chair bottom. Pt return demo understanding leading with ea LE.   08/09/2022 Neuromuscular Re-ed: See functional testing above for step strategy testing. Balance on foam beam: anticipatory stepping off beam & catching balance, progressed to reactionary with sudden removal of resistance.  Resistance band gait with static hold and eccentric motion: forward against resistance then hold for 5 sec, followed by walking backwards with resistance;  backward, right & left same motions. Blue theraband at pelvic level: walking forward, backward, right & left against resistance and return controlling speed & balance resisting band pulling her back to attachment point.  Standing with blue theraband resistance facilitating step strategy: posteriorly, anteriorly & laterally right/left. Stand on one leg with band pulling  and looking / reaching so band pulls her off balance requiring step to catch balance.  She required PT assistance to prevent fall & took multiple steps at times to catch balance.    HOME EXERCISE PROGRAM: Access Code: [email or web address] URL: [email or web address] Date: 07/26/2022 Prepared by: Jamey Reas  Exercises - Seated Hamstring Stretch  - 1 x daily - 7 x weekly - 1 sets - 3 reps - 30 seconds hold - Seated Long Arc Quad  - 1 x daily - 5 x weekly - 1 sets - 10 reps - 5 seconds hold - Sitting Knee Extension with Resistance  - 1 x daily - 3 x weekly - 1 sets - 10 reps - 5 seconds hold - Sit to Stand  - 1 x daily - 3 x weekly - 1 sets - 10 reps - 5 seconds hold - Seated Hamstring Curl with Anchored Resistance  - 1 x daily - 3 x weekly - 1 sets - 10 reps - 5 seconds hold - Standing Terminal Knee Extension with Resistance  - 1 x daily - 3 x weekly - 1 sets - 10 reps -  5 seconds hold - Single Leg Stance  - 1 x daily - 3 x weekly - 1 sets - 10 reps - 5 seconds hold - Gastroc Stretch on Step  - 1 x daily - 7 x weekly - 1 sets - 3 reps - 30 seconds hold - Standing Quad Stretch with Strap  - 1 x daily - 7 x weekly - 1 sets - 3 reps - 30 seconds hold   Access Code: [email or web address] URL: [email or web address] Date: 07/13/2022 Prepared by: Jamey Reas  Exercises - Hooklying Single Knee to Chest Stretch  - 2-4 x daily - 7 x weekly - 1 sets - 3 reps - 15 seconds hold - Supine Double Knee to Chest Modified  - 2-4 x daily - 7 x weekly - 1 sets - 3 reps - 15 seconds hold - Supine Hamstring Stretch  - 2-4 x daily - 7 x weekly - 1 sets - 3 reps - 15 seconds hold - Supine Lower Trunk Rotation  - 2-4 x daily - 7 x weekly - 1 sets - 3 reps - 15 seconds hold - Seated Pelvic Tilt  - 2-4 x daily - 7 x weekly - 1-3 sets - 10 reps - 5 seconds hold   Access Code: [email or web address] URL: [email or web address] Date: 06/06/2022 Prepared by: Jamey Reas  Exercises - Sit to Stand  - 3 x  daily - 7 x weekly - 1 sets - 10 reps - Seated Straight Leg Heel Taps  - 1-2 x daily - 7 x weekly - 1-2 sets - 10 reps - Tandem Stance  - 1-2 x daily - 7 x weekly - 1 sets - 1-2 reps - 30- 60 sec hold - Carioca with Counter Support  - 1-2 x daily - 7 x weekly - 1 sets - 10 reps - Standing hip extension alternating legs  - 1-2 x daily - 7 x weekly - 1 sets - 10 reps - 5 seconds hold - Standing hip abduction alternating legs  - 1-2 x daily - 7 x weekly - 1 sets - 10 reps - 5 seconds hold - Standing March with Counter Support  - 1-2 x daily - 7 x weekly - 1 sets - 10 reps - 5 seconds hold  ASSESSMENT: Patient improved balance reactions to include as HEP for 2-3x/wk. Pt also understands lunges & floor transfers for strength & function with recommendations for 2-3 x/wk. Pt is on target to meet LTGs.   Pt continues to benefit from skilled PT.   OBJECTIVE IMPAIRMENTS Abnormal gait, decreased activity tolerance, decreased balance, decreased endurance, decreased mobility, difficulty walking, decreased ROM, decreased strength, postural dysfunction, and pain.   ACTIVITY LIMITATIONS carrying, lifting, bending, squatting, stairs, toileting, dressing, locomotion level, and caring for others  PARTICIPATION LIMITATIONS: meal prep, cleaning, laundry, driving, community activity, and yard work  [place], Time since onset of injury/illness/exacerbation, and 3+ comorbidities: see PMH  are also affecting patient's functional outcome.   REHAB POTENTIAL: Good  CLINICAL DECISION MAKING: Evolving/moderate complexity  EVALUATION COMPLEXITY: Low   GOALS: Goals reviewed with patient? Yes  SHORT TERM GOALS: Target date: 08/10/22  Patient will be independent with updated HEP to continue progressing outside of clinic. Baseline: see objective data Goal status: Met 08/09/2022  2.  Patient able to recover balance within 1 step for step strategy testing anterior, posterior & laterally. Baseline: see  objective data Goal status: partially met 08/09/2022   UPDATED LONG TERM GOALS: Target date: 08/30/22  Patient will score  FOTO >/= 55% to reflect improved self-perceived functional ability. Baseline: see objective data Goal status: on going - assessed 07/31/2022   2.  Patient scores above a 25/30 on the FGA to represent low fall risk with gait activities. Baseline: see objective data Goal status: on going - assessed 07/31/2022  3.  Patient reports </= 2/10 back and knee pain with standing, gait, and stair activity. Baseline: see objective data Goal status: on going - assessed 110/23/2023  4.  Patient Rt knee seated LAQ to -2* to represent improved knee range and quadriceps strength. Baseline: see objective data Goal status: ongoing assessed 07/31/2022  5.  Patient safely ambulates 500' including ramp, curb, and stairs independently to allow for household and community mobility. Baseline: see objective data Goal status: met 07/31/2022  6.  Patient verbalizes and demonstrates ability to perform ADLs, household chores, and yardwork. Baseline: see objective data Goal status: on going 07/31/2022  PLAN: PT FREQUENCY: 2x/week  PT DURATION: 6 weeks  PLANNED INTERVENTIONS: Therapeutic exercises, Therapeutic activity, Neuromuscular re-education, Balance training, Gait training, Patient/Family education, Self Care, Joint mobilization, Stair training, Vestibular training, DME instructions, Dry Needling, Electrical stimulation, Spinal mobilization, Cryotherapy, Moist heat, Ultrasound, Ionotophoresis 71m/ml Dexamethasone, Manual therapy, Re-evaluation, and physical performance tests .  PLAN FOR NEXT SESSION:   check if any issues with updated HEP sent home on 11/8,  continue work towards [facility]weeny PT, DPT 08/16/2022, 2:47 PM

## 2022-08-16 NOTE — Patient Instructions (Signed)
Between 2 chairs: -Blue theraband at pelvic level: walking forward, backward, right & left against resistance and return controlling speed & balance resisting band pulling her back to attachment point.  -Standing with blue theraband resistance facilitating step strategy: posteriorly, anteriorly & laterally right/left. Stand on one leg with band pulling and looking / reaching so band pulls her off balance requiring step to catch balance.   -Standing hip kicks/stepping with green theraband (turn all 4 sides to door and pull band away from door)  abd, flex,  add & ext BLEs 10 reps  Lunges between 2 chairs alternating lead leg. Do 5-10 reps on each leg.  Floor transfer kneeling knee onto pillow. Do 2 reps kneeling on each knee.

## 2022-08-21 ENCOUNTER — Encounter: Payer: Self-pay | Admitting: Physical Therapy

## 2022-08-21 ENCOUNTER — Ambulatory Visit (INDEPENDENT_AMBULATORY_CARE_PROVIDER_SITE_OTHER): Payer: Medicare Other | Admitting: Physical Therapy

## 2022-08-21 DIAGNOSIS — M6281 Muscle weakness (generalized): Secondary | ICD-10-CM

## 2022-08-21 DIAGNOSIS — R2681 Unsteadiness on feet: Secondary | ICD-10-CM

## 2022-08-21 DIAGNOSIS — R2689 Other abnormalities of gait and mobility: Secondary | ICD-10-CM

## 2022-08-21 DIAGNOSIS — M25561 Pain in right knee: Secondary | ICD-10-CM

## 2022-08-21 DIAGNOSIS — M5459 Other low back pain: Secondary | ICD-10-CM | POA: Diagnosis not present

## 2022-08-21 NOTE — Therapy (Signed)
OUTPATIENT PHYSICAL THERAPY TREATMENT Patient Name: Katie Weber MRN: [id number] DOB:1945/11/06, 76 y.o., female Today's Date: 08/21/2022  END OF SESSION:  PT End of Session - 08/21/22 1300     Visit Number 22    Number of Visits 25    Date for PT Re-Evaluation 08/30/22    Authorization Type Medicare Part A and B    PT Start Time 1300    PT Stop Time 1344    PT Time Calculation (min) 44 min    Equipment Utilized During Treatment Gait belt    Activity Tolerance Patient tolerated treatment well    Behavior During Therapy WFL for tasks assessed/performed                 Past Medical History:  Diagnosis Date   Arthritis    arthritis fingers   Coronary artery calcification 04/24/2017   Diverticulitis    x2 -last 1'17 tx. outpatient.   Environmental and seasonal allergies    2014 Possibly related to air freshners.   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   Family history of anesthesia complication 19JKD ago   daughter had an allergic reaction to atropine   GERD (gastroesophageal reflux disease)    History of hiatal hernia 09/14/2014   History of kidney stones    '80's lithotripsy-[facility] x1   Hyperlipidemia    Hypothyroidism    Pneumonia 2014   Pure hypercholesterolemia    Tremor of left hand    Past Surgical History:  Procedure Laterality Date   BALLOON DILATION N/A 05/04/2014   Procedure: BALLOON DILATION;  Surgeon: Garlan Fair, MD;  Location: [facility];  Service: Endoscopy;  Laterality: N/A;   BREAST LUMPECTOMY Right 1972   benign    BREAST SURGERY Right    '72 -benign tumor   childbirth- NVD x2      CHOLECYSTECTOMY N/A 03/10/2022   Procedure: LAPAROSCOPIC CHOLECYSTECTOMY;  Surgeon: Jovita Kussmaul, MD;  Location: [place];  Service: General;  Laterality: N/A;   COLONOSCOPY WITH PROPOFOL N/A 05/04/2014   Procedure: COLONOSCOPY WITH PROPOFOL;  Surgeon: Garlan Fair, MD;  Location: [facility];  Service: Endoscopy;  Laterality: N/A;   DILATION AND CURETTAGE OF UTERUS     04-20-14 [facility] endometrial polyp removed.   ESOPHAGOGASTRODUODENOSCOPY (EGD) WITH PROPOFOL N/A 05/04/2014   Procedure: ESOPHAGOGASTRODUODENOSCOPY (EGD) WITH PROPOFOL;  Surgeon: Garlan Fair, MD;  Location: [facility];  Service: Endoscopy;  Laterality: N/A;. Procedure done in [place], [place]- 5 yrs ago.   HYSTEROSCOPY WITH D & C N/A 04/20/2014   Procedure: DILATATION AND CURETTAGE /HYSTEROSCOPY;  Surgeon: [name], MD;  Location: [facility];  Service: Gynecology;  Laterality: N/A;   IR PERC CHOLECYSTOSTOMY  01/08/2022   IR RADIOLOGIST EVAL & MGMT  02/10/2022   LAPAROSCOPY N/A 03/14/2022   Procedure: LAPAROSCOPY DIAGNOSTIC  WITH IRRIGATION AND  DRAINAGE OF Lucia Bitter;  Surgeon: Jovita Kussmaul, MD;  Location: [place];  Service: General;  Laterality: N/A;   LITHOTRIPSY     POLYPECTOMY  2020   Dr. Lear Ng   TOTAL KNEE ARTHROPLASTY Left 02/15/2021   Procedure: LEFT TOTAL KNEE ARTHROPLASTY;  Surgeon: Mcarthur Rossetti, MD;  Location: [place];  Service: Orthopedics;  Laterality: Left;   TOTAL KNEE ARTHROPLASTY Right 08/09/2021   Procedure: RIGHT TOTAL KNEE ARTHROPLASTY;  Surgeon: Mcarthur Rossetti, MD;  Location: [place];  Service: Orthopedics;  Laterality: Right;  Needs  RNFA   TUBAL LIGATION     Patient Active Problem List   Diagnosis Date Noted   Hypovolemia due to dehydration 03/15/2022   AKI (acute kidney injury) ([place]) 03/15/2022   Biloma following surgery 03/14/2022   Abdominal pain 03/13/2022   Postoperative pain 03/13/2022   Acute cholecystitis 88/89/1694   Biliary colic 50/38/8828   New onset atrial flutter ([place]) 01/07/2022   DJD (degenerative joint disease) of knee 08/09/2021   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   Unilateral primary osteoarthritis, right knee 06/06/2021   Status post total left knee replacement 02/15/2021   Unilateral primary  osteoarthritis, left knee 01/04/2021   Coronary artery calcification 04/24/2017   Hyperlipidemia 04/24/2017   Acute bronchitis 09/29/2014   SBO (small bowel obstruction) ([place]) 09/27/2014   Nausea and vomiting 09/27/2014   Hypothyroidism 09/27/2014   Leukocytosis 09/27/2014    PCP: Leeroy Cha, MD  REFERRING PROVIDER: Jovita Kussmaul, MD  REFERRING DIAG: T81.89XA,K66.8 (ICD-10-CM) - Biloma following surgery, initial encounter G89.18 (ICD-10-CM) - Postoperative pain  Rationale for Evaluation and Treatment Rehabilitation  THERAPY DIAG:  Unsteadiness on feet  Other abnormalities of gait and mobility  Muscle weakness (generalized)  Other low back pain  Acute pain of right knee  ONSET DATE: 03/17/22 Referral  SUBJECTIVE:  She lifted a box of 8 wine bottles with some back soreness.  She also watched her 40yo granddaughter so increased activity level. She did her exercises with band issued last week without issues.                                                                                                                                           PERTINENT HISTORY:  Cholecystectomy 03/10/22, laparoscopy 03/14/22 TKA Lt 5/22 Rt 11/22, hypothyroidism, hyperlipidemia, OA  PAIN:  NPRS scale: 0/10 since last PT lowest 0/10 & highest 3/10 Pain location:back upper lumbar & lower thoracic Pain description: dull achy Aggravating factors: standing Relieving factors:  walking & flexing forward   NPRS scale:  1/10, since last PT lowest 0/10 & highest 3/10 Pain location: Rt anterior-lateral knee and patella Pain description: soreness Aggravating factors: first getting up in the morning or initially moving after knee stiffening, bending Relieving factors: moving  PRECAUTIONS: None  WEIGHT BEARING RESTRICTIONS No  FALLS:  Has patient fallen in last 6 months? No  LIVING ENVIRONMENT: Lives with: lives with their spouse Lives in: House/apartment Stairs: Yes: Internal: 13  steps; on left going up and External: 2 steps; can reach both Has following equipment at home: Single point cane, Walker - 2 wheeled, Manufacturing engineer  OCCUPATION: retired, for leisure she enjoys hiking  PLOF: [place] return to activity level prior to surgery without pain   OBJECTIVE:   PATIENT SURVEYS:  08/02/2022:  FOTO 57% 07/17/2022:  FOTO 50% 06/20/2022 FOTO 53% 05/29/2022 FOTO  44%, target 55%  COGNITION: 05/29/2022  Overall cognitive status: Within functional limits for tasks assessed    POSTURE:  05/29/2022 flexed trunk  and weight shift left  PALPATION: 06/20/2022 - tenderness to palpation at distal IT band, no tenderness at joint line  LUMBAR ROM:   Active AROM  05/29/2022  Flexion   Extension   Right lateral flexion   Left lateral flexion   Right rotation   Left rotation    (Blank rows = not tested)  LOWER EXTREMITY ROM:     ROM (A: AROM, P: PROM) Right Eval 05/29/2022 Left Eval 05/29/2022 Right 06/20/22 Right 07/19/22  Hip flexion      Hip extension      Hip abduction      Hip adduction      Hip internal rotation      Hip external rotation      Knee flexion      Knee extension Seated LAQ A: -8* Seated LAQ WNL Seated  LAQ A: -2* Seated LAQ -2*  Ankle dorsiflexion      Ankle plantarflexion      Ankle inversion      Ankle eversion       (Blank rows = not tested)  LOWER EXTREMITY MMT:    MMT Right Eval 05/29/2022 Left Eval 05/29/2022 Right 06/20/22 Left  06/20/22 Right 07/31/2022 Left 07/31/2022  Hip flexion        Hip extension        Hip abduction        Hip adduction        Hip internal rotation        Hip external rotation        Knee flexion Seated  4/5 painful Seated  4/5 Seated HH Dynameter 35.8# & 36.7# RLE:LLE 86.7% Seated HH Dynameter 40.8# & 43.1#    Knee extension Seated  4/5 painful Seated  5/5 Seated HH Dynameter 52.9# & 54.1# RLE:LLE 99.7% Seated HH dynameter 55.9# & 52.4# 5/5 64.9, 65.1 lbs  5/5 57.7, 62.6 lbs  Ankle dorsiflexion        Ankle plantarflexion        Ankle inversion        Ankle eversion         (Blank rows = not tested)  FUNCTIONAL TESTS:  08/09/2022 Assessment of step strategy: anterior within one step BLEs, posterior within one step RLE but 2 steps LLE, laterally to both right & Left steps with incorrect LE initial step with balance loss requiring PT assist.   07/17/2022 Functional Gait Assessment: 23/30  06/20/2022 Functional gait assessment: 21/30  05/29/2022 5 times sit to stand: 11.38 sec, no UE use  (cutoff for older adults is <12sec)  Functional gait assessment: 11/30   GAIT: 07/17/2022: Pt amb without device  Gait velocity:  comfortable 4.03 ft/sec  fast pace 4.89 ft/sec   06/16/2022:  Independent ambulation into and through clinic between activity.  Less occurrences of lateral instability but still noted on occasion in stance phase SL for both LE.   05/29/2022 Distance walked: 200' Assistive device utilized: None Level of assistance: SBA Comments: shortened step length bil., flexed trunk, some M-L instability and near losses of balance  TODAY'S TREATMENT  08/21/2022 Neuromuscular Re-ed:  Standing on foam beam - reactionary step strategy (pt distracted with conversation) ant, post and lat to right & left.  SLS rolling tennis ball slow then fast - ant/post, med/lat & circles CW/CCW Rocker board with ant/level/post  & right /level/left to facilitate hip strategy. With square double  pivot board intermittent touch and with single pivot round board with light BUE support.   08/16/2022 Neuromuscular Re-ed:  Blue theraband at pelvic level: walking forward, backward, right & left against resistance and return controlling speed & balance resisting band pulling her back to attachment point.  Standing with blue theraband resistance facilitating step strategy: posteriorly, anteriorly & laterally right/left. Stand on one leg with band pulling and looking  / reaching so band pulls her off balance requiring step to catch balance.   Standing hip stepping with green theraband abd, flex,  add & ext BLEs 10 reps with supervision for balance & verbal cues on technique. Pt performed above between 2 chair backs and verbalized / return demo as HEP.   Back stretch at door frame reaching single UE & BUEs overhead with deep breaths. Pt reports that it relaxed back after above exercises challenged her.   Therapeutic Activities: PT reviewed modified lunge between 2 chairs pt performed 3 reps leading with ea LE. PT reviewed on floor transfer onto pillow to protect knees pushing UEs on chair bottom. Pt return demo understanding leading with ea LE.   08/14/2022 Neuromuscular Re-ed:  Blue theraband at pelvic level: walking forward, backward, right & left against resistance and return controlling speed & balance resisting band pulling her back to attachment point.  Standing with blue theraband resistance facilitating step strategy: posteriorly, anteriorly & laterally right/left. Stand on one leg with band pulling and looking / reaching so band pulls her off balance requiring step to catch balance.   Standing hip stepping with green theraband abd, flex,  add & ext BLEs 10 reps with supervision for balance & verbal cues on technique. Back stretch at door frame reaching single UE & BUEs overhead with deep breaths. Pt reports that it relaxed back after above exercises challenged her.    Therapeutic Activities: PT demo & verbal cues on modified lunge between 2 chairs pt performed 5 reps leading with ea LE. PT demo & verbal cues on floor transfer onto pillow to protect knees pushing UEs on chair bottom. Pt return demo understanding leading with ea LE.    HOME EXERCISE PROGRAM: Access Code: [email or web address] URL: [email or web address] Date: 07/26/2022 Prepared by: Jamey Reas  Exercises - Seated Hamstring Stretch  - 1 x daily - 7 x weekly - 1 sets - 3 reps -  30 seconds hold - Seated Long Arc Quad  - 1 x daily - 5 x weekly - 1 sets - 10 reps - 5 seconds hold - Sitting Knee Extension with Resistance  - 1 x daily - 3 x weekly - 1 sets - 10 reps - 5 seconds hold - Sit to Stand  - 1 x daily - 3 x weekly - 1 sets - 10 reps - 5 seconds hold - Seated Hamstring Curl with Anchored Resistance  - 1 x daily - 3 x weekly - 1 sets - 10 reps - 5 seconds hold - Standing Terminal Knee Extension with Resistance  - 1 x daily - 3 x weekly - 1 sets - 10 reps - 5 seconds hold - Single Leg Stance  - 1 x daily - 3 x weekly - 1 sets - 10 reps - 5 seconds hold - Gastroc Stretch on Step  - 1 x daily - 7 x weekly - 1 sets - 3 reps - 30 seconds hold - Standing Quad Stretch with Strap  - 1 x daily - 7 x weekly - 1 sets - 3 reps - 30  seconds hold   Access Code: [email or web address] URL: [email or web address] Date: 07/13/2022 Prepared by: Jamey Reas  Exercises - Hooklying Single Knee to Chest Stretch  - 2-4 x daily - 7 x weekly - 1 sets - 3 reps - 15 seconds hold - Supine Double Knee to Chest Modified  - 2-4 x daily - 7 x weekly - 1 sets - 3 reps - 15 seconds hold - Supine Hamstring Stretch  - 2-4 x daily - 7 x weekly - 1 sets - 3 reps - 15 seconds hold - Supine Lower Trunk Rotation  - 2-4 x daily - 7 x weekly - 1 sets - 3 reps - 15 seconds hold - Seated Pelvic Tilt  - 2-4 x daily - 7 x weekly - 1-3 sets - 10 reps - 5 seconds hold   Access Code: [email or web address] URL: [email or web address] Date: 06/06/2022 Prepared by: Jamey Reas  Exercises - Sit to Stand  - 3 x daily - 7 x weekly - 1 sets - 10 reps - Seated Straight Leg Heel Taps  - 1-2 x daily - 7 x weekly - 1-2 sets - 10 reps - Tandem Stance  - 1-2 x daily - 7 x weekly - 1 sets - 1-2 reps - 30- 60 sec hold - Carioca with Counter Support  - 1-2 x daily - 7 x weekly - 1 sets - 10 reps - Standing hip extension alternating legs  - 1-2 x daily - 7 x weekly - 1 sets - 10 reps - 5 seconds hold - Standing hip  abduction alternating legs  - 1-2 x daily - 7 x weekly - 1 sets - 10 reps - 5 seconds hold - Standing March with Counter Support  - 1-2 x daily - 7 x weekly - 1 sets - 10 reps - 5 seconds hold  ASSESSMENT: Patient has improved balance strategies. With step strategy to left, she initiates movement with incorrect LE ~50% of time but only crossed over feet once. Pt is on target to meet LTGs.   Pt continues to benefit from skilled PT.   OBJECTIVE IMPAIRMENTS Abnormal gait, decreased activity tolerance, decreased balance, decreased endurance, decreased mobility, difficulty walking, decreased ROM, decreased strength, postural dysfunction, and pain.   ACTIVITY LIMITATIONS carrying, lifting, bending, squatting, stairs, toileting, dressing, locomotion level, and caring for others  PARTICIPATION LIMITATIONS: meal prep, cleaning, laundry, driving, community activity, and yard work  [place], Time since onset of injury/illness/exacerbation, and 3+ comorbidities: see PMH  are also affecting patient's functional outcome.   REHAB POTENTIAL: Good  CLINICAL DECISION MAKING: Evolving/moderate complexity  EVALUATION COMPLEXITY: Low   GOALS: Goals reviewed with patient? Yes  SHORT TERM GOALS: Target date: 08/10/22  Patient will be independent with updated HEP to continue progressing outside of clinic. Baseline: see objective data Goal status: Met 08/09/2022  2.  Patient able to recover balance within 1 step for step strategy testing anterior, posterior & laterally. Baseline: see objective data Goal status: partially met 08/09/2022   UPDATED LONG TERM GOALS: Target date: 08/30/22  Patient will score FOTO >/= 55% to reflect improved self-perceived functional ability. Baseline: see objective data Goal status: on going - assessed 07/31/2022   2.  Patient scores above a 25/30 on the FGA to represent low fall risk with gait activities. Baseline: see objective data Goal status: on going  - assessed 07/31/2022  3.  Patient reports </= 2/10 back and knee pain with standing, gait, and stair activity. Baseline: see objective data Goal status:  on going - assessed 110/23/2023  4.  Patient Rt knee seated LAQ to -2* to represent improved knee range and quadriceps strength. Baseline: see objective data Goal status: ongoing assessed 07/31/2022  5.  Patient safely ambulates 500' including ramp, curb, and stairs independently to allow for household and community mobility. Baseline: see objective data Goal status: met 07/31/2022  6.  Patient verbalizes and demonstrates ability to perform ADLs, household chores, and yardwork. Baseline: see objective data Goal status: on going 07/31/2022  PLAN: PT FREQUENCY: 2x/week  PT DURATION: 6 weeks  PLANNED INTERVENTIONS: Therapeutic exercises, Therapeutic activity, Neuromuscular re-education, Balance training, Gait training, Patient/Family education, Self Care, Joint mobilization, Stair training, Vestibular training, DME instructions, Dry Needling, Electrical stimulation, Spinal mobilization, Cryotherapy, Moist heat, Ultrasound, Ionotophoresis 33m/ml Dexamethasone, Manual therapy, Re-evaluation, and physical performance tests .  PLAN FOR NEXT SESSION:  continue work towards [place] PT, DPT 08/21/2022, 3:47 PM

## 2022-08-23 ENCOUNTER — Ambulatory Visit (INDEPENDENT_AMBULATORY_CARE_PROVIDER_SITE_OTHER): Payer: Medicare Other | Admitting: Physical Therapy

## 2022-08-23 ENCOUNTER — Encounter: Payer: Self-pay | Admitting: Physical Therapy

## 2022-08-23 DIAGNOSIS — R2689 Other abnormalities of gait and mobility: Secondary | ICD-10-CM

## 2022-08-23 DIAGNOSIS — M6281 Muscle weakness (generalized): Secondary | ICD-10-CM

## 2022-08-23 DIAGNOSIS — M25561 Pain in right knee: Secondary | ICD-10-CM

## 2022-08-23 DIAGNOSIS — M5459 Other low back pain: Secondary | ICD-10-CM

## 2022-08-23 DIAGNOSIS — R2681 Unsteadiness on feet: Secondary | ICD-10-CM | POA: Diagnosis not present

## 2022-08-23 NOTE — Therapy (Signed)
OUTPATIENT PHYSICAL THERAPY TREATMENT Patient Name: Katie Weber MRN: [id number] DOB:1946/03/26, 76 y.o., female Today's Date: 08/23/2022  END OF SESSION:  PT End of Session - 08/23/22 1300     Visit Number 23    Number of Visits 25    Date for PT Re-Evaluation 08/30/22    Authorization Type Medicare Part A and B    PT Start Time 1300    PT Stop Time 1345    PT Time Calculation (min) 45 min    Equipment Utilized During Treatment Gait belt    Activity Tolerance Patient tolerated treatment well    Behavior During Therapy WFL for tasks assessed/performed                 Past Medical History:  Diagnosis Date   Arthritis    arthritis fingers   Coronary artery calcification 04/24/2017   Diverticulitis    x2 -last 1'17 tx. outpatient.   Environmental and seasonal allergies    2014 Possibly related to air freshners.   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   Family history of anesthesia complication 30QMV ago   daughter had an allergic reaction to atropine   GERD (gastroesophageal reflux disease)    History of hiatal hernia 09/14/2014   History of kidney stones    '80's lithotripsy-[facility] x1   Hyperlipidemia    Hypothyroidism    Pneumonia 2014   Pure hypercholesterolemia    Tremor of left hand    Past Surgical History:  Procedure Laterality Date   BALLOON DILATION N/A 05/04/2014   Procedure: BALLOON DILATION;  Surgeon: Garlan Fair, MD;  Location: [facility];  Service: Endoscopy;  Laterality: N/A;   BREAST LUMPECTOMY Right 1972   benign    BREAST SURGERY Right    '72 -benign tumor   childbirth- NVD x2      CHOLECYSTECTOMY N/A 03/10/2022   Procedure: LAPAROSCOPIC CHOLECYSTECTOMY;  Surgeon: Jovita Kussmaul, MD;  Location: [facility];  Service: General;  Laterality: N/A;   COLONOSCOPY WITH PROPOFOL N/A 05/04/2014   Procedure: COLONOSCOPY WITH PROPOFOL;  Surgeon: Garlan Fair, MD;  Location: [facility];  Service: Endoscopy;  Laterality: N/A;   DILATION AND CURETTAGE OF UTERUS     04-20-14 [facility] endometrial polyp removed.   ESOPHAGOGASTRODUODENOSCOPY (EGD) WITH PROPOFOL N/A 05/04/2014   Procedure: ESOPHAGOGASTRODUODENOSCOPY (EGD) WITH PROPOFOL;  Surgeon: Garlan Fair, MD;  Location: [facility];  Service: Endoscopy;  Laterality: N/A;. Procedure done in [place], [place]- 5 yrs ago.   HYSTEROSCOPY WITH D & C N/A 04/20/2014   Procedure: DILATATION AND CURETTAGE /HYSTEROSCOPY;  Surgeon: [name], MD;  Location: [facility];  Service: Gynecology;  Laterality: N/A;   IR PERC CHOLECYSTOSTOMY  01/08/2022   IR RADIOLOGIST EVAL & MGMT  02/10/2022   LAPAROSCOPY N/A 03/14/2022   Procedure: LAPAROSCOPY DIAGNOSTIC  WITH IRRIGATION AND  DRAINAGE OF Lucia Bitter;  Surgeon: Jovita Kussmaul, MD;  Location: [facility];  Service: General;  Laterality: N/A;   LITHOTRIPSY     POLYPECTOMY  2020   Dr. Lear Ng   TOTAL KNEE ARTHROPLASTY Left 02/15/2021   Procedure: LEFT TOTAL KNEE ARTHROPLASTY;  Surgeon: Mcarthur Rossetti, MD;  Location: [facility];  Service: Orthopedics;  Laterality: Left;   TOTAL KNEE ARTHROPLASTY Right 08/09/2021   Procedure: RIGHT TOTAL KNEE ARTHROPLASTY;  Surgeon: Mcarthur Rossetti, MD;  Location: [facility];  Service: Orthopedics;  Laterality: Right;  Needs  RNFA   TUBAL LIGATION     Patient Active Problem List   Diagnosis Date Noted   Hypovolemia due to dehydration 03/15/2022   AKI (acute kidney injury) ([place]) 03/15/2022   Biloma following surgery 03/14/2022   Abdominal pain 03/13/2022   Postoperative pain 03/13/2022   Acute cholecystitis 33/29/5188   Biliary colic 41/66/0630   New onset atrial flutter ([place]) 01/07/2022   DJD (degenerative joint disease) of knee 08/09/2021   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   Unilateral primary osteoarthritis, right knee 06/06/2021   Status post total left knee replacement 02/15/2021   Unilateral primary  osteoarthritis, left knee 01/04/2021   Coronary artery calcification 04/24/2017   Hyperlipidemia 04/24/2017   Acute bronchitis 09/29/2014   SBO (small bowel obstruction) ([place]) 09/27/2014   Nausea and vomiting 09/27/2014   Hypothyroidism 09/27/2014   Leukocytosis 09/27/2014    PCP: Leeroy Cha, MD  REFERRING PROVIDER: Jovita Kussmaul, MD  REFERRING DIAG: T81.89XA,K66.8 (ICD-10-CM) - Biloma following surgery, initial encounter G89.18 (ICD-10-CM) - Postoperative pain  Rationale for Evaluation and Treatment Rehabilitation  THERAPY DIAG:  Unsteadiness on feet  Other abnormalities of gait and mobility  Muscle weakness (generalized)  Other low back pain  Acute pain of right knee  ONSET DATE: 03/17/22 Referral  SUBJECTIVE:  She reports doing exercises and understands need for ongoing HEP / fitness.                                                                                                                                           PERTINENT HISTORY:  Cholecystectomy 03/10/22, laparoscopy 03/14/22 TKA Lt 5/22 Rt 11/22, hypothyroidism, hyperlipidemia, OA  PAIN:  NPRS scale:  0/10 since last PT lowest 0/10 & highest 1/10 Pain location:back upper lumbar & lower thoracic Pain description: dull achy Aggravating factors: standing Relieving factors:  walking & flexing forward   NPRS scale:  0/10, since last PT lowest 0/10 & highest 1/10 Pain location: Rt anterior-lateral knee and patella Pain description: soreness Aggravating factors: first getting up in the morning or initially moving after knee stiffening, bending Relieving factors: moving  PRECAUTIONS: None  WEIGHT BEARING RESTRICTIONS No  FALLS:  Has patient fallen in last 6 months? No  LIVING ENVIRONMENT: Lives with: lives with their spouse Lives in: House/apartment Stairs: Yes: Internal: 13 steps; on left going up and External: 2 steps; can reach both Has following equipment at home: Single point cane,  Walker - 2 wheeled, Manufacturing engineer  OCCUPATION: retired, for leisure she enjoys hiking  PLOF: [place] return to activity level prior to surgery without pain   OBJECTIVE:   PATIENT SURVEYS:  08/02/2022:  FOTO 57% 07/17/2022:  FOTO 50% 06/20/2022 FOTO 53% 05/29/2022 FOTO  44%, target 55%  COGNITION: 05/29/2022  Overall cognitive status: Within functional limits for tasks assessed    POSTURE:  05/29/2022 flexed trunk  and weight  shift left  PALPATION: 06/20/2022 - tenderness to palpation at distal IT band, no tenderness at joint line  LUMBAR ROM:   Active AROM  05/29/2022  Flexion   Extension   Right lateral flexion   Left lateral flexion   Right rotation   Left rotation    (Blank rows = not tested)  LOWER EXTREMITY ROM:     ROM (A: AROM, P: PROM) Right Eval 05/29/2022 Left Eval 05/29/2022 Right 06/20/22 Right 07/19/22  Hip flexion      Hip extension      Hip abduction      Hip adduction      Hip internal rotation      Hip external rotation      Knee flexion      Knee extension Seated LAQ A: -8* Seated LAQ WNL Seated  LAQ A: -2* Seated LAQ -2*  Ankle dorsiflexion      Ankle plantarflexion      Ankle inversion      Ankle eversion       (Blank rows = not tested)  LOWER EXTREMITY MMT:    MMT Right Eval 05/29/2022 Left Eval 05/29/2022 Right 06/20/22 Left  06/20/22 Right 07/31/2022 Left 07/31/2022  Hip flexion        Hip extension        Hip abduction        Hip adduction        Hip internal rotation        Hip external rotation        Knee flexion Seated  4/5 painful Seated  4/5 Seated HH Dynameter 35.8# & 36.7# RLE:LLE 86.7% Seated HH Dynameter 40.8# & 43.1#    Knee extension Seated  4/5 painful Seated  5/5 Seated HH Dynameter 52.9# & 54.1# RLE:LLE 99.7% Seated HH dynameter 55.9# & 52.4# 5/5 64.9, 65.1 lbs 5/5 57.7, 62.6 lbs  Ankle dorsiflexion        Ankle plantarflexion        Ankle inversion        Ankle  eversion         (Blank rows = not tested)  FUNCTIONAL TESTS:  08/09/2022 Assessment of step strategy: anterior within one step BLEs, posterior within one step RLE but 2 steps LLE, laterally to both right & Left steps with incorrect LE initial step with balance loss requiring PT assist.   07/17/2022 Functional Gait Assessment: 23/30  06/20/2022 Functional gait assessment: 21/30  05/29/2022 5 times sit to stand: 11.38 sec, no UE use  (cutoff for older adults is <12sec)  Functional gait assessment: 11/30   GAIT: 07/17/2022: Pt amb without device  Gait velocity:  comfortable 4.03 ft/sec  fast pace 4.89 ft/sec   06/16/2022:  Independent ambulation into and through clinic between activity.  Less occurrences of lateral instability but still noted on occasion in stance phase SL for both LE.   05/29/2022 Distance walked: 200' Assistive device utilized: None Level of assistance: SBA Comments: shortened step length bil., flexed trunk, some M-L instability and near losses of balance  TODAY'S TREATMENT  08/23/2022 Gait Training: PT instructed in each of following tasks with pt performing with PT supervision. Pt set up as additional option for HEP / activities. Pt verbalized & return demo understanding.   Walking / Balance Walk with confidence taking full steps.  Reach with heels. Work on picking up pace by taking longer & quicker steps. Work on scanning and maintain pace & path:  right/left, up/down, diagonals. Work on walking  3 steps eyes open, then 3 steps eyes closed.  Stand up & walk without hesitancy. Then walk backwards. Go to right & to left.  A.side stepping  B.crossover in front  C.crossover in back  D.braiding (alternate in front & in back) Near wall "drunk" test forward & backward. Toss washcloth from hand to hand start standing still and progress to walking. Arm swing: start by stepping one leg forward and reaching opposite arm then walk.  Try to keep arm swing with ramps or  challenging situations.  08/21/2022 Neuromuscular Re-ed:  Standing on foam beam - reactionary step strategy (pt distracted with conversation) ant, post and lat to right & left.  SLS rolling tennis ball slow then fast - ant/post, med/lat & circles CW/CCW Rocker board with ant/level/post  & right /level/left to facilitate hip strategy. With square double pivot board intermittent touch and with single pivot round board with light BUE support.   08/16/2022 Neuromuscular Re-ed:  Blue theraband at pelvic level: walking forward, backward, right & left against resistance and return controlling speed & balance resisting band pulling her back to attachment point.  Standing with blue theraband resistance facilitating step strategy: posteriorly, anteriorly & laterally right/left. Stand on one leg with band pulling and looking / reaching so band pulls her off balance requiring step to catch balance.   Standing hip stepping with green theraband abd, flex,  add & ext BLEs 10 reps with supervision for balance & verbal cues on technique. Pt performed above between 2 chair backs and verbalized / return demo as HEP.   Back stretch at door frame reaching single UE & BUEs overhead with deep breaths. Pt reports that it relaxed back after above exercises challenged her.   Therapeutic Activities: PT reviewed modified lunge between 2 chairs pt performed 3 reps leading with ea LE. PT reviewed on floor transfer onto pillow to protect knees pushing UEs on chair bottom. Pt return demo understanding leading with ea LE.   HOME EXERCISE PROGRAM: Access Code: [email or web address] URL: [email or web address] Date: 07/26/2022 Prepared by: Jamey Reas  Exercises - Seated Hamstring Stretch  - 1 x daily - 7 x weekly - 1 sets - 3 reps - 30 seconds hold - Seated Long Arc Quad  - 1 x daily - 5 x weekly - 1 sets - 10 reps - 5 seconds hold - Sitting Knee Extension with Resistance  - 1 x daily - 3 x weekly - 1 sets - 10 reps -  5 seconds hold - Sit to Stand  - 1 x daily - 3 x weekly - 1 sets - 10 reps - 5 seconds hold - Seated Hamstring Curl with Anchored Resistance  - 1 x daily - 3 x weekly - 1 sets - 10 reps - 5 seconds hold - Standing Terminal Knee Extension with Resistance  - 1 x daily - 3 x weekly - 1 sets - 10 reps - 5 seconds hold - Single Leg Stance  - 1 x daily - 3 x weekly - 1 sets - 10 reps - 5 seconds hold - Gastroc Stretch on Step  - 1 x daily - 7 x weekly - 1 sets - 3 reps - 30 seconds hold - Standing Quad Stretch with Strap  - 1 x daily - 7 x weekly - 1 sets - 3 reps - 30 seconds hold   Access Code: [email or web address] URL: [email or web address] Date: 07/13/2022 Prepared by: Jamey Reas  Exercises - Hooklying Single Knee to Chest Stretch  - 2-4 x  daily - 7 x weekly - 1 sets - 3 reps - 15 seconds hold - Supine Double Knee to Chest Modified  - 2-4 x daily - 7 x weekly - 1 sets - 3 reps - 15 seconds hold - Supine Hamstring Stretch  - 2-4 x daily - 7 x weekly - 1 sets - 3 reps - 15 seconds hold - Supine Lower Trunk Rotation  - 2-4 x daily - 7 x weekly - 1 sets - 3 reps - 15 seconds hold - Seated Pelvic Tilt  - 2-4 x daily - 7 x weekly - 1-3 sets - 10 reps - 5 seconds hold   Access Code: [email or web address] URL: [email or web address] Date: 06/06/2022 Prepared by: Jamey Reas  Exercises - Sit to Stand  - 3 x daily - 7 x weekly - 1 sets - 10 reps - Seated Straight Leg Heel Taps  - 1-2 x daily - 7 x weekly - 1-2 sets - 10 reps - Tandem Stance  - 1-2 x daily - 7 x weekly - 1 sets - 1-2 reps - 30- 60 sec hold - Carioca with Counter Support  - 1-2 x daily - 7 x weekly - 1 sets - 10 reps - Standing hip extension alternating legs  - 1-2 x daily - 7 x weekly - 1 sets - 10 reps - 5 seconds hold - Standing hip abduction alternating legs  - 1-2 x daily - 7 x weekly - 1 sets - 10 reps - 5 seconds hold - Standing March with Counter Support  - 1-2 x daily - 7 x weekly - 1 sets - 10 reps - 5 seconds  hold  ASSESSMENT: Patient improved gait including advanced activities with PT instruction.  She appears to understand how to perform outside of PT as ongoing HEP option.  PT has instructed over last few weeks in options of exercises / activities to facilitate improved functional skills without accomodation.  She appears on target to meet LTGs next week at end of POC.   OBJECTIVE IMPAIRMENTS Abnormal gait, decreased activity tolerance, decreased balance, decreased endurance, decreased mobility, difficulty walking, decreased ROM, decreased strength, postural dysfunction, and pain.   ACTIVITY LIMITATIONS carrying, lifting, bending, squatting, stairs, toileting, dressing, locomotion level, and caring for others  PARTICIPATION LIMITATIONS: meal prep, cleaning, laundry, driving, community activity, and yard work  [place], Time since onset of injury/illness/exacerbation, and 3+ comorbidities: see PMH  are also affecting patient's functional outcome.   REHAB POTENTIAL: Good  CLINICAL DECISION MAKING: Evolving/moderate complexity  EVALUATION COMPLEXITY: Low   GOALS: Goals reviewed with patient? Yes  SHORT TERM GOALS: Target date: 08/10/22  Patient will be independent with updated HEP to continue progressing outside of clinic. Baseline: see objective data Goal status: Met 08/09/2022  2.  Patient able to recover balance within 1 step for step strategy testing anterior, posterior & laterally. Baseline: see objective data Goal status: partially met 08/09/2022   UPDATED LONG TERM GOALS: Target date: 08/30/22  Patient will score FOTO >/= 55% to reflect improved self-perceived functional ability. Baseline: see objective data Goal status: on going - assessed 07/31/2022   2.  Patient scores above a 25/30 on the FGA to represent low fall risk with gait activities. Baseline: see objective data Goal status: on going - assessed 07/31/2022  3.  Patient reports </= 2/10 back and  knee pain with standing, gait, and stair activity. Baseline: see objective data Goal status: on going - assessed 110/23/2023  4.  Patient Rt  knee seated LAQ to -2* to represent improved knee range and quadriceps strength. Baseline: see objective data Goal status: ongoing assessed 07/31/2022  5.  Patient safely ambulates 500' including ramp, curb, and stairs independently to allow for household and community mobility. Baseline: see objective data Goal status: met 07/31/2022  6.  Patient verbalizes and demonstrates ability to perform ADLs, household chores, and yardwork. Baseline: see objective data Goal status: on going 07/31/2022  PLAN: PT FREQUENCY: 2x/week  PT DURATION: 6 weeks  PLANNED INTERVENTIONS: Therapeutic exercises, Therapeutic activity, Neuromuscular re-education, Balance training, Gait training, Patient/Family education, Self Care, Joint mobilization, Stair training, Vestibular training, DME instructions, Dry Needling, Electrical stimulation, Spinal mobilization, Cryotherapy, Moist heat, Ultrasound, Ionotophoresis 93m/ml Dexamethasone, Manual therapy, Re-evaluation, and physical performance tests .  PLAN FOR NEXT SESSION:  begin to check LTGs with plan to discharge 11/22 at end of [place] [facility], DPT 08/23/2022, 1:59 PM

## 2022-08-23 NOTE — Patient Instructions (Signed)
Walking / Balance Walk with confidence taking full steps.  Reach with heels. Work on picking up pace by taking longer & quicker steps. Work on scanning and maintain pace & path:  right/left, up/down, diagonals. Work on walking 3 steps eyes open, then 3 steps eyes closed.  Stand up & walk without hesitancy. Then walk backwards. Go to right & to left.  A.side stepping  B.crossover in front  C.crossover in back  D.braiding (alternate in front & in back) Near wall "drunk" test forward & backward. Toss washcloth from hand to hand start standing still and progress to walking. Arm swing: start by stepping one leg forward and reaching opposite arm then walk.  Try to keep arm swing with ramps or challenging situations.

## 2022-08-28 ENCOUNTER — Encounter: Payer: Medicare Other | Admitting: Rehabilitative and Restorative Service Providers"

## 2022-08-29 ENCOUNTER — Encounter: Payer: Self-pay | Admitting: Physical Therapy

## 2022-08-29 ENCOUNTER — Ambulatory Visit (INDEPENDENT_AMBULATORY_CARE_PROVIDER_SITE_OTHER): Payer: Medicare Other | Admitting: Physical Therapy

## 2022-08-29 DIAGNOSIS — M5459 Other low back pain: Secondary | ICD-10-CM

## 2022-08-29 DIAGNOSIS — M6281 Muscle weakness (generalized): Secondary | ICD-10-CM

## 2022-08-29 DIAGNOSIS — R2681 Unsteadiness on feet: Secondary | ICD-10-CM

## 2022-08-29 DIAGNOSIS — R2689 Other abnormalities of gait and mobility: Secondary | ICD-10-CM

## 2022-08-29 DIAGNOSIS — M25561 Pain in right knee: Secondary | ICD-10-CM

## 2022-08-29 NOTE — Therapy (Signed)
OUTPATIENT PHYSICAL THERAPY TREATMENT Patient Name: Katie Weber MRN: [id number] DOB:1945/12/18, 76 y.o., female Today's Date: 08/29/2022  END OF SESSION:  PT End of Session - 08/29/22 1257     Visit Number 24    Number of Visits 25    Date for PT Re-Evaluation 08/30/22    Authorization Type Medicare Part A and B    PT Start Time 1257    PT Stop Time 1344    PT Time Calculation (min) 47 min    Equipment Utilized During Treatment Gait belt    Activity Tolerance Patient tolerated treatment well    Behavior During Therapy WFL for tasks assessed/performed                 Past Medical History:  Diagnosis Date   Arthritis    arthritis fingers   Coronary artery calcification 04/24/2017   Diverticulitis    x2 -last 1'17 tx. outpatient.   Environmental and seasonal allergies    2014 Possibly related to air freshners.   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   Family history of anesthesia complication 54DIY ago   daughter had an allergic reaction to atropine   GERD (gastroesophageal reflux disease)    History of hiatal hernia 09/14/2014   History of kidney stones    '80's lithotripsy-[facility] x1   Hyperlipidemia    Hypothyroidism    Pneumonia 2014   Pure hypercholesterolemia    Tremor of left hand    Past Surgical History:  Procedure Laterality Date   BALLOON DILATION N/A 05/04/2014   Procedure: BALLOON DILATION;  Surgeon: Garlan Fair, MD;  Location: [facility];  Service: Endoscopy;  Laterality: N/A;   BREAST LUMPECTOMY Right 1972   benign    BREAST SURGERY Right    '72 -benign tumor   childbirth- NVD x2      CHOLECYSTECTOMY N/A 03/10/2022   Procedure: LAPAROSCOPIC CHOLECYSTECTOMY;  Surgeon: Jovita Kussmaul, MD;  Location: [facility];  Service: General;  Laterality: N/A;   COLONOSCOPY WITH PROPOFOL N/A 05/04/2014   Procedure: COLONOSCOPY WITH PROPOFOL;  Surgeon: Garlan Fair, MD;  Location: [facility];  Service: Endoscopy;  Laterality: N/A;   DILATION AND CURETTAGE OF UTERUS     04-20-14 [facility] hospital endometrial polyp removed.   ESOPHAGOGASTRODUODENOSCOPY (EGD) WITH PROPOFOL N/A 05/04/2014   Procedure: ESOPHAGOGASTRODUODENOSCOPY (EGD) WITH PROPOFOL;  Surgeon: Garlan Fair, MD;  Location: [facility];  Service: Endoscopy;  Laterality: N/A;. Procedure done in [place], [place]- 5 yrs ago.   HYSTEROSCOPY WITH D & C N/A 04/20/2014   Procedure: DILATATION AND CURETTAGE /HYSTEROSCOPY;  Surgeon: [name], MD;  Location: [facility];  Service: Gynecology;  Laterality: N/A;   IR PERC CHOLECYSTOSTOMY  01/08/2022   IR RADIOLOGIST EVAL & MGMT  02/10/2022   LAPAROSCOPY N/A 03/14/2022   Procedure: LAPAROSCOPY DIAGNOSTIC  WITH IRRIGATION AND  DRAINAGE OF Lucia Bitter;  Surgeon: Jovita Kussmaul, MD;  Location: [place] [facility];  Service: General;  Laterality: N/A;   LITHOTRIPSY     POLYPECTOMY  2020   Dr. Lear Ng   TOTAL KNEE ARTHROPLASTY Left 02/15/2021   Procedure: LEFT TOTAL KNEE ARTHROPLASTY;  Surgeon: Mcarthur Rossetti, MD;  Location: [facility];  Service: Orthopedics;  Laterality: Left;   TOTAL KNEE ARTHROPLASTY Right 08/09/2021   Procedure: RIGHT TOTAL KNEE ARTHROPLASTY;  Surgeon: Mcarthur Rossetti, MD;  Location: [facility];  Service: Orthopedics;  Laterality: Right;  Needs  RNFA   TUBAL LIGATION     Patient Active Problem List   Diagnosis Date Noted   Hypovolemia due to dehydration 03/15/2022   AKI (acute kidney injury) ([place]) 03/15/2022   Biloma following surgery 03/14/2022   Abdominal pain 03/13/2022   Postoperative pain 03/13/2022   Acute cholecystitis 29/92/4268   Biliary colic 34/19/6222   New onset atrial flutter ([place]) 01/07/2022   DJD (degenerative joint disease) of knee 08/09/2021   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   Unilateral primary osteoarthritis, right knee 06/06/2021   Status post total left knee replacement 02/15/2021   Unilateral primary  osteoarthritis, left knee 01/04/2021   Coronary artery calcification 04/24/2017   Hyperlipidemia 04/24/2017   Acute bronchitis 09/29/2014   SBO (small bowel obstruction) ([place]) 09/27/2014   Nausea and vomiting 09/27/2014   Hypothyroidism 09/27/2014   Leukocytosis 09/27/2014    PCP: Leeroy Cha, MD  REFERRING PROVIDER: Jovita Kussmaul, MD  REFERRING DIAG: T81.89XA,K66.8 (ICD-10-CM) - Biloma following surgery, initial encounter G89.18 (ICD-10-CM) - Postoperative pain  Rationale for Evaluation and Treatment Rehabilitation  THERAPY DIAG:  Unsteadiness on feet  Other abnormalities of gait and mobility  Muscle weakness (generalized)  Other low back pain  Acute pain of right knee  ONSET DATE: 03/17/22 Referral  SUBJECTIVE:  she has been doing walking including tasks. She is stiff from weather today but not in pain.                                                                                                                                            PERTINENT HISTORY:  Cholecystectomy 03/10/22, laparoscopy 03/14/22 TKA Lt 5/22 Rt 11/22, hypothyroidism, hyperlipidemia, OA  PAIN:  NPRS scale:  0/10 since last PT lowest 0/10 & highest 3/10 Pain location:back upper lumbar & lower thoracic Pain description: dull achy Aggravating factors: standing Relieving factors:  walking & flexing forward   NPRS scale:  0/10, since last PT lowest 0/10 & highest 0/10 but left knee 1x in night 4/10 Pain location: Rt anterior-lateral knee and patella Pain description: soreness Aggravating factors: first getting up in the morning or initially moving after knee stiffening, bending Relieving factors: moving  PRECAUTIONS: None  WEIGHT BEARING RESTRICTIONS No  FALLS:  Has patient fallen in last 6 months? No  LIVING ENVIRONMENT: Lives with: lives with their spouse Lives in: House/apartment Stairs: Yes: Internal: 13 steps; on left going up and External: 2 steps; can reach both Has  following equipment at home: Single point cane, Walker - 2 wheeled, Manufacturing engineer  OCCUPATION: retired, for leisure she enjoys hiking  PLOF: [place] return to activity level prior to surgery without pain   OBJECTIVE:   PATIENT SURVEYS:  08/02/2022:  FOTO 57% 07/17/2022:  FOTO 50% 06/20/2022 FOTO 53% 05/29/2022 FOTO  44%, target 55%  COGNITION: 05/29/2022  Overall cognitive status: Within functional limits for  tasks assessed    POSTURE:  05/29/2022 flexed trunk  and weight shift left  PALPATION: 06/20/2022 - tenderness to palpation at distal IT band, no tenderness at joint line  LUMBAR ROM:   Active AROM  05/29/2022  Flexion   Extension   Right lateral flexion   Left lateral flexion   Right rotation   Left rotation    (Blank rows = not tested)  LOWER EXTREMITY ROM:     ROM (A: AROM, P: PROM) Right Eval 05/29/2022 Left Eval 05/29/2022 Right 06/20/22 Right 07/19/22 Right 08/29/22  Hip flexion       Hip extension       Hip abduction       Hip adduction       Hip internal rotation       Hip external rotation       Knee flexion       Knee extension Seated LAQ A: -8* Seated LAQ WNL Seated  LAQ A: -2* Seated LAQ -2* Seated LAQ 0*  Ankle dorsiflexion       Ankle plantarflexion       Ankle inversion       Ankle eversion        (Blank rows = not tested)  LOWER EXTREMITY MMT:    MMT Right Eval 05/29/2022 Left Eval 05/29/2022 Right 06/20/22 Left  06/20/22 Right 07/31/2022 Left 07/31/2022 Right/ Left 08/29/22  Hip flexion         Hip extension         Hip abduction         Hip adduction         Hip internal rotation         Hip external rotation         Knee flexion Seated  4/5 painful Seated  4/5 Seated HH Dynameter 35.8# & 36.7# RLE:LLE 86.7% Seated HH Dynameter 40.8# & 43.1#     Knee extension Seated  4/5 painful Seated  5/5 Seated HH Dynameter 52.9# & 54.1# RLE:LLE 99.7% Seated HH dynameter 55.9# & 52.4# 5/5 64.9, 65.1  lbs 5/5 57.7, 62.6 lbs RLE 60.9# & 62# LLE 69# & 67.2# RLE:LLE 90.4%  Ankle dorsiflexion         Ankle plantarflexion         Ankle inversion         Ankle eversion          (Blank rows = not tested)  FUNCTIONAL TESTS:  08/09/2022 Assessment of step strategy: anterior within one step BLEs, posterior within one step RLE but 2 steps LLE, laterally to both right & Left steps with incorrect LE initial step with balance loss requiring PT assist.   07/17/2022 Functional Gait Assessment: 23/30  06/20/2022 Functional gait assessment: 21/30  05/29/2022 5 times sit to stand: 11.38 sec, no UE use  (cutoff for older adults is <12sec)  Functional gait assessment: 11/30   GAIT: 08/29/2022  Resting HR 81 SpO2 98% 6 Min Walk test = 1515' Post HR 108 SpO2 98%  07/17/2022: Pt amb without device  Gait velocity:  comfortable 4.03 ft/sec  fast pace 4.89 ft/sec   06/16/2022:  Independent ambulation into and through clinic between activity.  Less occurrences of lateral instability but still noted on occasion in stance phase SL for both LE.   05/29/2022 Distance walked: 200' Assistive device utilized: None Level of assistance: SBA Comments: shortened step length bil., flexed trunk, some M-L instability and near losses of balance  TODAY'S TREATMENT  08/29/2022  Therapeutic Exercise: PT reviewed ongoing fitness to include components of flexibility, strength, balance & endurance during week.   PT showed pt classes at [facility] with recommendation to start with Chair Yoga, SAIL and [place]. PT recommended write 2 exercises after each class to add to options for her ongoing fitness. Pt verbalized understanding.   See objective for MMT, AROM & 6 min walk test.   08/23/2022 Gait Training: PT instructed in each of following tasks with pt performing with PT supervision. Pt set up as additional option for HEP / activities. Pt verbalized & return demo understanding.   Walking /  Balance Walk with confidence taking full steps.  Reach with heels. Work on picking up pace by taking longer & quicker steps. Work on scanning and maintain pace & path:  right/left, up/down, diagonals. Work on walking 3 steps eyes open, then 3 steps eyes closed.  Stand up & walk without hesitancy. Then walk backwards. Go to right & to left.  A.side stepping  B.crossover in front  C.crossover in back  D.braiding (alternate in front & in back) Near wall "drunk" test forward & backward. Toss washcloth from hand to hand start standing still and progress to walking. Arm swing: start by stepping one leg forward and reaching opposite arm then walk.  Try to keep arm swing with ramps or challenging situations.  08/21/2022 Neuromuscular Re-ed:  Standing on foam beam - reactionary step strategy (pt distracted with conversation) ant, post and lat to right & left.  SLS rolling tennis ball slow then fast - ant/post, med/lat & circles CW/CCW Rocker board with ant/level/post  & right /level/left to facilitate hip strategy. With square double pivot board intermittent touch and with single pivot round board with light BUE support.   HOME EXERCISE PROGRAM: Access Code: [email or web address] URL: [email or web address] Date: 07/26/2022 Prepared by: Jamey Reas  Exercises - Seated Hamstring Stretch  - 1 x daily - 7 x weekly - 1 sets - 3 reps - 30 seconds hold - Seated Long Arc Quad  - 1 x daily - 5 x weekly - 1 sets - 10 reps - 5 seconds hold - Sitting Knee Extension with Resistance  - 1 x daily - 3 x weekly - 1 sets - 10 reps - 5 seconds hold - Sit to Stand  - 1 x daily - 3 x weekly - 1 sets - 10 reps - 5 seconds hold - Seated Hamstring Curl with Anchored Resistance  - 1 x daily - 3 x weekly - 1 sets - 10 reps - 5 seconds hold - Standing Terminal Knee Extension with Resistance  - 1 x daily - 3 x weekly - 1 sets - 10 reps - 5 seconds hold - Single Leg Stance  - 1 x daily - 3 x weekly - 1 sets - 10 reps -  5 seconds hold - Gastroc Stretch on Step  - 1 x daily - 7 x weekly - 1 sets - 3 reps - 30 seconds hold - Standing Quad Stretch with Strap  - 1 x daily - 7 x weekly - 1 sets - 3 reps - 30 seconds hold   Access Code: [email or web address] URL: [email or web address] Date: 07/13/2022 Prepared by: Jamey Reas  Exercises - Hooklying Single Knee to Chest Stretch  - 2-4 x daily - 7 x weekly - 1 sets - 3 reps - 15 seconds hold - Supine Double Knee to Chest Modified  - 2-4 x daily - 7 x weekly - 1  sets - 3 reps - 15 seconds hold - Supine Hamstring Stretch  - 2-4 x daily - 7 x weekly - 1 sets - 3 reps - 15 seconds hold - Supine Lower Trunk Rotation  - 2-4 x daily - 7 x weekly - 1 sets - 3 reps - 15 seconds hold - Seated Pelvic Tilt  - 2-4 x daily - 7 x weekly - 1-3 sets - 10 reps - 5 seconds hold   Access Code: [email or web address] URL: [email or web address] Date: 06/06/2022 Prepared by: Jamey Reas  Exercises - Sit to Stand  - 3 x daily - 7 x weekly - 1 sets - 10 reps - Seated Straight Leg Heel Taps  - 1-2 x daily - 7 x weekly - 1-2 sets - 10 reps - Tandem Stance  - 1-2 x daily - 7 x weekly - 1 sets - 1-2 reps - 30- 60 sec hold - [place]arioca with Counter Support  - 1-2 x daily - 7 x weekly - 1 sets - 10 reps - Standing hip extension alternating legs  - 1-2 x daily - 7 x weekly - 1 sets - 10 reps - 5 seconds hold - Standing hip abduction alternating legs  - 1-2 x daily - 7 x weekly - 1 sets - 10 reps - 5 seconds hold - Standing March with Counter Support  - 1-2 x daily - 7 x weekly - 1 sets - 10 reps - 5 seconds hold  ASSESSMENT: Patient met the 4 LTGs checked today and PT anticipates meeting remaining at next visit.  Pt's gait has improved significantly. She reports doing more things without issues.   OBJECTIVE IMPAIRMENTS Abnormal gait, decreased activity tolerance, decreased balance, decreased endurance, decreased mobility, difficulty walking, decreased ROM, decreased strength, postural  dysfunction, and pain.   ACTIVITY LIMITATIONS carrying, lifting, bending, squatting, stairs, toileting, dressing, locomotion level, and caring for others  PARTICIPATION LIMITATIONS: meal prep, cleaning, laundry, driving, community activity, and yard work  [place], Time since onset of injury/illness/exacerbation, and 3+ comorbidities: see PMH  are also affecting patient's functional outcome.   REHAB POTENTIAL: Good  CLINICAL DECISION MAKING: Evolving/moderate complexity  EVALUATION COMPLEXITY: Low   GOALS: Goals reviewed with patient? Yes  SHORT TERM GOALS: Target date: 08/10/22  Patient will be independent with updated HEP to continue progressing outside of clinic. Baseline: see objective data Goal status: Met 08/09/2022  2.  Patient able to recover balance within 1 step for step strategy testing anterior, posterior & laterally. Baseline: see objective data Goal status: partially met 08/09/2022   UPDATED LONG TERM GOALS: Target date: 08/30/22  Patient will score FOTO >/= 55% to reflect improved self-perceived functional ability. Baseline: see objective data Goal status: on going - assessed 07/31/2022   2.  Patient scores above a 25/30 on the FGA to represent low fall risk with gait activities. Baseline: see objective data Goal status: on going - assessed 07/31/2022  3.  Patient reports </= 2/10 back and knee pain with standing, gait, and stair activity. Baseline: see objective data Goal status:  MET 08/29/2022  4.  Patient Rt knee seated LAQ to -2* to represent improved knee range and quadriceps strength. Baseline: see objective data Goal status: MET 08/29/2022  5.  Patient safely ambulates 500' including ramp, curb, and stairs independently to allow for household and community mobility. Baseline: see objective data Goal status: MET 08/29/2022  6.  Patient verbalizes and demonstrates ability to perform ADLs, household chores, and yardwork. Baseline:  see  objective data Goal status: MET 08/29/2022  PLAN: PT FREQUENCY: 2x/week  PT DURATION: 6 weeks  PLANNED INTERVENTIONS: Therapeutic exercises, Therapeutic activity, Neuromuscular re-education, Balance training, Gait training, Patient/Family education, Self Care, Joint mobilization, Stair training, Vestibular training, DME instructions, Dry Needling, Electrical stimulation, Spinal mobilization, Cryotherapy, Moist heat, Ultrasound, Ionotophoresis 36m/ml Dexamethasone, Manual therapy, Re-evaluation, and physical performance tests .  PLAN FOR NEXT SESSION:  assess remaining LTGs & discharge    RJamey Reas PT, DPT 08/29/2022, 4:20 PM

## 2022-08-30 ENCOUNTER — Ambulatory Visit (INDEPENDENT_AMBULATORY_CARE_PROVIDER_SITE_OTHER): Payer: Medicare Other | Admitting: Physical Therapy

## 2022-08-30 ENCOUNTER — Encounter: Payer: Self-pay | Admitting: Physical Therapy

## 2022-08-30 DIAGNOSIS — R2689 Other abnormalities of gait and mobility: Secondary | ICD-10-CM | POA: Diagnosis not present

## 2022-08-30 DIAGNOSIS — M5459 Other low back pain: Secondary | ICD-10-CM | POA: Diagnosis not present

## 2022-08-30 DIAGNOSIS — R2681 Unsteadiness on feet: Secondary | ICD-10-CM | POA: Diagnosis not present

## 2022-08-30 DIAGNOSIS — M6281 Muscle weakness (generalized): Secondary | ICD-10-CM | POA: Diagnosis not present

## 2022-08-30 DIAGNOSIS — M25561 Pain in right knee: Secondary | ICD-10-CM

## 2022-08-30 NOTE — Therapy (Signed)
OUTPATIENT PHYSICAL THERAPY TREATMENT & DISCHARGE SUMMARY Patient Name: Katie Weber MRN: [id number] DOB:03-13-1946, 76 y.o., female Today's Date: 08/30/2022  PHYSICAL THERAPY DISCHARGE SUMMARY  Visits from Start of Care: 25  Current functional level related to goals / functional outcomes: All LTGs met - see below.    Remaining deficits: See below   Education / Equipment: Ongoing HEP / fitness plan.    Patient agrees to discharge. Patient goals were met. Patient is being discharged due to meeting the stated rehab goals.   END OF SESSION:  PT End of Session - 08/30/22 1259     Visit Number 25    Number of Visits 25    Date for PT Re-Evaluation 08/30/22    Authorization Type Medicare Part A and B    PT Start Time 1259    PT Stop Time 1330    PT Time Calculation (min) 31 min    Equipment Utilized During Treatment Gait belt    Activity Tolerance Patient tolerated treatment well    Behavior During Therapy WFL for tasks assessed/performed                  Past Medical History:  Diagnosis Date   Arthritis    arthritis fingers   Coronary artery calcification 04/24/2017   Diverticulitis    x2 -last 1'17 tx. outpatient.   Environmental and seasonal allergies    2014 Possibly related to air freshners.   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   Family history of anesthesia complication 76OTL ago   daughter had an allergic reaction to atropine   GERD (gastroesophageal reflux disease)    History of hiatal hernia 09/14/2014   History of kidney stones    '80's lithotripsy-[facility] x1   Hyperlipidemia    Hypothyroidism    Pneumonia 2014   Pure hypercholesterolemia    Tremor of left hand    Past Surgical History:  Procedure Laterality Date   BALLOON DILATION N/A 05/04/2014   Procedure: BALLOON DILATION;  Surgeon: Garlan Fair, MD;  Location: [facility];  Service: Endoscopy;  Laterality: N/A;    BREAST LUMPECTOMY Right 1972   benign    BREAST SURGERY Right    '72 -benign tumor   childbirth- NVD x2      CHOLECYSTECTOMY N/A 03/10/2022   Procedure: LAPAROSCOPIC CHOLECYSTECTOMY;  Surgeon: Jovita Kussmaul, MD;  Location: [facility];  Service: General;  Laterality: N/A;   COLONOSCOPY WITH PROPOFOL N/A 05/04/2014   Procedure: COLONOSCOPY WITH PROPOFOL;  Surgeon: Garlan Fair, MD;  Location: [facility];  Service: Endoscopy;  Laterality: N/A;   DILATION AND CURETTAGE OF UTERUS     04-20-14 [facility] hospital endometrial polyp removed.   ESOPHAGOGASTRODUODENOSCOPY (EGD) WITH PROPOFOL N/A 05/04/2014   Procedure: ESOPHAGOGASTRODUODENOSCOPY (EGD) WITH PROPOFOL;  Surgeon: Garlan Fair, MD;  Location: [facility];  Service: Endoscopy;  Laterality: N/A;. Procedure done in [place], [place]- 5 yrs ago.   HYSTEROSCOPY WITH D & C N/A 04/20/2014   Procedure: DILATATION AND CURETTAGE /HYSTEROSCOPY;  Surgeon: [name], MD;  Location: [facility];  Service: Gynecology;  Laterality: N/A;   IR PERC CHOLECYSTOSTOMY  01/08/2022   IR RADIOLOGIST EVAL & MGMT  02/10/2022   LAPAROSCOPY N/A 03/14/2022   Procedure: LAPAROSCOPY DIAGNOSTIC  WITH IRRIGATION AND  DRAINAGE OF Lucia Bitter;  Surgeon: Jovita Kussmaul, MD;  Location: [place] [facility];  Service: General;  Laterality: N/A;   LITHOTRIPSY  POLYPECTOMY  2020   Dr. Lear Ng   TOTAL KNEE ARTHROPLASTY Left 02/15/2021   Procedure: LEFT TOTAL KNEE ARTHROPLASTY;  Surgeon: Mcarthur Rossetti, MD;  Location: [facility];  Service: Orthopedics;  Laterality: Left;   TOTAL KNEE ARTHROPLASTY Right 08/09/2021   Procedure: RIGHT TOTAL KNEE ARTHROPLASTY;  Surgeon: Mcarthur Rossetti, MD;  Location: [place];  Service: Orthopedics;  Laterality: Right;  Needs RNFA   TUBAL LIGATION     Patient Active Problem List   Diagnosis Date Noted   Hypovolemia due to dehydration 03/15/2022   AKI (acute kidney injury) ([place]) 03/15/2022   Biloma following surgery 03/14/2022   Abdominal pain  03/13/2022   Postoperative pain 03/13/2022   Acute cholecystitis 37/34/2876   Biliary colic 81/15/7262   New onset atrial flutter ([place]) 01/07/2022   DJD (degenerative joint disease) of knee 08/09/2021   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   Unilateral primary osteoarthritis, right knee 06/06/2021   Status post total left knee replacement 02/15/2021   Unilateral primary osteoarthritis, left knee 01/04/2021   Coronary artery calcification 04/24/2017   Hyperlipidemia 04/24/2017   Acute bronchitis 09/29/2014   SBO (small bowel obstruction) ([place]) 09/27/2014   Nausea and vomiting 09/27/2014   Hypothyroidism 09/27/2014   Leukocytosis 09/27/2014    PCP: Leeroy Cha, MD  REFERRING PROVIDER: Jovita Kussmaul, MD  REFERRING DIAG: T81.89XA,K66.8 (ICD-10-CM) - Biloma following surgery, initial encounter G89.18 (ICD-10-CM) - Postoperative pain  Rationale for Evaluation and Treatment Rehabilitation  THERAPY DIAG:  Unsteadiness on feet  Other abnormalities of gait and mobility  Muscle weakness (generalized)  Other low back pain  Acute pain of right knee  ONSET DATE: 03/17/22 Referral  SUBJECTIVE: she had some leg soreness with speed walk yesterday.  She looked into senior center website and plans to try chair yoga & Gisela.                                                                                                                                           PERTINENT HISTORY:  Cholecystectomy 03/10/22, laparoscopy 03/14/22 TKA Lt 5/22 Rt 11/22, hypothyroidism, hyperlipidemia, OA  PAIN:  NPRS scale:  0/10 since last PT lowest 0/10 & highest 1/10 Pain location:back upper lumbar & lower thoracic Pain description: dull achy Aggravating factors: standing Relieving factors:  walking & flexing forward   NPRS scale:  0/10, since last PT lowest 0/10 & highest 0/10 Pain location: Rt anterior-lateral knee and patella Pain description: soreness Aggravating factors:  first getting up in the morning or initially moving after knee stiffening, bending Relieving factors: moving  PRECAUTIONS: None  WEIGHT BEARING RESTRICTIONS No  FALLS:  Has patient fallen in last 6 months? No  LIVING ENVIRONMENT: Lives with: lives with their spouse Lives in: House/apartment Stairs: Yes: Internal: 13 steps; on left going up and External: 2 steps; can reach both Has following equipment at  home: Single point cane, Walker - 2 wheeled, Manufacturing engineer  OCCUPATION: retired, for leisure she enjoys hiking  PLOF: [place] return to activity level prior to surgery without pain   OBJECTIVE:   PATIENT SURVEYS:  08/30/2022: 25 visit FOTO 58.3% 08/02/2022:  FOTO 57% 07/17/2022:  FOTO 50% 06/20/2022 FOTO 53% 05/29/2022 FOTO  44%, target 55%  COGNITION: 05/29/2022  Overall cognitive status: Within functional limits for tasks assessed    POSTURE:  05/29/2022 flexed trunk  and weight shift left  PALPATION: 06/20/2022 - tenderness to palpation at distal IT band, no tenderness at joint line  LUMBAR ROM:   Active AROM  05/29/2022  Flexion   Extension   Right lateral flexion   Left lateral flexion   Right rotation   Left rotation    (Blank rows = not tested)  LOWER EXTREMITY ROM:     ROM (A: AROM, P: PROM) Right Eval 05/29/2022 Left Eval 05/29/2022 Right 06/20/22 Right 07/19/22 Right 08/29/22  Hip flexion       Hip extension       Hip abduction       Hip adduction       Hip internal rotation       Hip external rotation       Knee flexion       Knee extension Seated LAQ A: -8* Seated LAQ WNL Seated  LAQ A: -2* Seated LAQ -2* Seated LAQ 0*  Ankle dorsiflexion       Ankle plantarflexion       Ankle inversion       Ankle eversion        (Blank rows = not tested)  LOWER EXTREMITY MMT:    MMT Right Eval 05/29/2022 Left Eval 05/29/2022 Right 06/20/22 Left  06/20/22 Right 07/31/2022 Left 07/31/2022 Right/ Left 08/29/22  Hip flexion          Hip extension         Hip abduction         Hip adduction         Hip internal rotation         Hip external rotation         Knee flexion Seated  4/5 painful Seated  4/5 Seated HH Dynameter 35.8# & 36.7# RLE:LLE 86.7% Seated HH Dynameter 40.8# & 43.1#     Knee extension Seated  4/5 painful Seated  5/5 Seated HH Dynameter 52.9# & 54.1# RLE:LLE 99.7% Seated HH dynameter 55.9# & 52.4# 5/5 64.9, 65.1 lbs 5/5 57.7, 62.6 lbs RLE 60.9# & 62# LLE 69# & 67.2# RLE:LLE 90.4%  Ankle dorsiflexion         Ankle plantarflexion         Ankle inversion         Ankle eversion          (Blank rows = not tested)  FUNCTIONAL TESTS:  08/30/2022  Step strategy test - ant, post, right & left single step correctly. Functional Gait Assessment 30/30  [facility] PT Assessment - 08/30/22 1300       Functional Gait  Assessment   Gait assessed  Yes    Gait Level Surface Walks 20 ft in less than 5.5 sec, no assistive devices, good speed, no evidence for imbalance, normal gait pattern, deviates no more than 6 in outside of the 12 in walkway width.    Change in Gait Speed Able to smoothly change walking speed without loss of balance or gait deviation. Deviate no  more than 6 in outside of the 12 in walkway width.    Gait with Horizontal Head Turns Performs head turns smoothly with no change in gait. Deviates no more than 6 in outside 12 in walkway width    Gait with Vertical Head Turns Performs head turns with no change in gait. Deviates no more than 6 in outside 12 in walkway width.    Gait and Pivot Turn Pivot turns safely within 3 sec and stops quickly with no loss of balance.    Step Over Obstacle Is able to step over 2 stacked shoe boxes taped together (9 in total height) without changing gait speed. No evidence of imbalance.    Gait with Narrow Base of Support Is able to ambulate for 10 steps heel to toe with no staggering.    Gait with Eyes Closed Walks 20 ft, no assistive devices, good speed,  no evidence of imbalance, normal gait pattern, deviates no more than 6 in outside 12 in walkway width. Ambulates 20 ft in less than 7 sec.    Ambulating Backwards Walks 20 ft, no assistive devices, good speed, no evidence for imbalance, normal gait    Steps Alternating feet, no rail.    Total Score 30              08/09/2022 Assessment of step strategy: anterior within one step BLEs, posterior within one step RLE but 2 steps LLE, laterally to both right & Left steps with incorrect LE initial step with balance loss requiring PT assist.   07/17/2022 Functional Gait Assessment: 23/30  [facility] PT Assessment - 08/30/22 1300       Functional Gait  Assessment   Gait assessed  Yes    Gait Level Surface Walks 20 ft in less than 5.5 sec, no assistive devices, good speed, no evidence for imbalance, normal gait pattern, deviates no more than 6 in outside of the 12 in walkway width.    Change in Gait Speed Able to smoothly change walking speed without loss of balance or gait deviation. Deviate no more than 6 in outside of the 12 in walkway width.    Gait with Horizontal Head Turns Performs head turns smoothly with no change in gait. Deviates no more than 6 in outside 12 in walkway width    Gait with Vertical Head Turns Performs head turns with no change in gait. Deviates no more than 6 in outside 12 in walkway width.    Gait and Pivot Turn Pivot turns safely within 3 sec and stops quickly with no loss of balance.    Step Over Obstacle Is able to step over 2 stacked shoe boxes taped together (9 in total height) without changing gait speed. No evidence of imbalance.    Gait with Narrow Base of Support Is able to ambulate for 10 steps heel to toe with no staggering.    Gait with Eyes Closed Walks 20 ft, no assistive devices, good speed, no evidence of imbalance, normal gait pattern, deviates no more than 6 in outside 12 in walkway width. Ambulates 20 ft in less than 7 sec.    Ambulating Backwards Walks 20  ft, no assistive devices, good speed, no evidence for imbalance, normal gait    Steps Alternating feet, no rail.    Total Score 30            06/20/2022 Functional gait assessment: 21/30  [facility] PT Assessment - 08/30/22 1300       Functional Gait  Assessment  Gait assessed  Yes    Gait Level Surface Walks 20 ft in less than 5.5 sec, no assistive devices, good speed, no evidence for imbalance, normal gait pattern, deviates no more than 6 in outside of the 12 in walkway width.    Change in Gait Speed Able to smoothly change walking speed without loss of balance or gait deviation. Deviate no more than 6 in outside of the 12 in walkway width.    Gait with Horizontal Head Turns Performs head turns smoothly with no change in gait. Deviates no more than 6 in outside 12 in walkway width    Gait with Vertical Head Turns Performs head turns with no change in gait. Deviates no more than 6 in outside 12 in walkway width.    Gait and Pivot Turn Pivot turns safely within 3 sec and stops quickly with no loss of balance.    Step Over Obstacle Is able to step over 2 stacked shoe boxes taped together (9 in total height) without changing gait speed. No evidence of imbalance.    Gait with Narrow Base of Support Is able to ambulate for 10 steps heel to toe with no staggering.    Gait with Eyes Closed Walks 20 ft, no assistive devices, good speed, no evidence of imbalance, normal gait pattern, deviates no more than 6 in outside 12 in walkway width. Ambulates 20 ft in less than 7 sec.    Ambulating Backwards Walks 20 ft, no assistive devices, good speed, no evidence for imbalance, normal gait    Steps Alternating feet, no rail.    Total Score 30            05/29/2022 5 times sit to stand: 11.38 sec, no UE use  (cutoff for older adults is <12sec)  Functional gait assessment: 11/30   GAIT: 08/30/2022 Gait velocity comfortable pace 4.36 ft/sec and fast pace 5.87 ft/sec  08/29/2022  Resting HR 81 SpO2  98% 6 Min Walk test = 1515' Post HR 108 SpO2 98%  07/17/2022: Pt amb without device  Gait velocity:  comfortable 4.03 ft/sec  fast pace 4.89 ft/sec   06/16/2022:  Independent ambulation into and through clinic between activity.  Less occurrences of lateral instability but still noted on occasion in stance phase SL for both LE.   05/29/2022 Distance walked: 200' Assistive device utilized: None Level of assistance: SBA Comments: shortened step length bil., flexed trunk, some M-L instability and near losses of balance  TODAY'S TREATMENT  08/30/2022 See above for FGA, FOTO, gait velocity & step strategy testing.   08/29/2022 Therapeutic Exercise: PT reviewed ongoing fitness to include components of flexibility, strength, balance & endurance during week.   PT showed pt classes at [facility] with recommendation to start with Chair Yoga, SAIL and [facility]. PT recommended write 2 exercises after each class to add to options for her ongoing fitness. Pt verbalized understanding.   See objective for MMT, AROM & 6 min walk test.   08/23/2022 Gait Training: PT instructed in each of following tasks with pt performing with PT supervision. Pt set up as additional option for HEP / activities. Pt verbalized & return demo understanding.   HOME EXERCISE PROGRAM:  Walking / Balance Walk with confidence taking full steps.  Reach with heels. Work on picking up pace by taking longer & quicker steps. Work on scanning and maintain pace & path:  right/left, up/down, diagonals. Work on walking 3 steps eyes open, then 3 steps eyes closed.  Stand up & walk without hesitancy. Then walk backwards. Go to right & to left.  A.side stepping  B.crossover in front  C.crossover in back  D.braiding (alternate in front & in back) Near wall "drunk" test forward & backward. Toss washcloth from hand to hand start standing still and progress to walking. Arm swing: start by stepping one leg forward and  reaching opposite arm then walk.  Try to keep arm swing with ramps or challenging situations.  Access Code: [email or web address] URL: [email or web address] Date: 07/26/2022 Prepared by: Jamey Reas  Exercises - Seated Hamstring Stretch  - 1 x daily - 7 x weekly - 1 sets - 3 reps - 30 seconds hold - Seated Long Arc Quad  - 1 x daily - 5 x weekly - 1 sets - 10 reps - 5 seconds hold - Sitting Knee Extension with Resistance  - 1 x daily - 3 x weekly - 1 sets - 10 reps - 5 seconds hold - Sit to Stand  - 1 x daily - 3 x weekly - 1 sets - 10 reps - 5 seconds hold - Seated Hamstring Curl with Anchored Resistance  - 1 x daily - 3 x weekly - 1 sets - 10 reps - 5 seconds hold - Standing Terminal Knee Extension with Resistance  - 1 x daily - 3 x weekly - 1 sets - 10 reps - 5 seconds hold - Single Leg Stance  - 1 x daily - 3 x weekly - 1 sets - 10 reps - 5 seconds hold - Gastroc Stretch on Step  - 1 x daily - 7 x weekly - 1 sets - 3 reps - 30 seconds hold - Standing Quad Stretch with Strap  - 1 x daily - 7 x weekly - 1 sets - 3 reps - 30 seconds hold   Access Code: [email or web address] URL: [email or web address] Date: 07/13/2022 Prepared by: Jamey Reas  Exercises - Hooklying Single Knee to Chest Stretch  - 2-4 x daily - 7 x weekly - 1 sets - 3 reps - 15 seconds hold - Supine Double Knee to Chest Modified  - 2-4 x daily - 7 x weekly - 1 sets - 3 reps - 15 seconds hold - Supine Hamstring Stretch  - 2-4 x daily - 7 x weekly - 1 sets - 3 reps - 15 seconds hold - Supine Lower Trunk Rotation  - 2-4 x daily - 7 x weekly - 1 sets - 3 reps - 15 seconds hold - Seated Pelvic Tilt  - 2-4 x daily - 7 x weekly - 1-3 sets - 10 reps - 5 seconds hold   Access Code: [email or web address] URL: [email or web address] Date: 06/06/2022 Prepared by: Jamey Reas  Exercises - Sit to Stand  - 3 x daily - 7 x weekly - 1 sets - 10 reps - Seated Straight Leg Heel Taps  - 1-2 x daily - 7 x weekly - 1-2 sets -  10 reps - Tandem Stance  - 1-2 x daily - 7 x weekly - 1 sets - 1-2 reps - 30- 60 sec hold - Carioca with Counter Support  - 1-2 x daily - 7 x weekly - 1 sets - 10 reps - Standing hip extension alternating legs  - 1-2 x daily - 7 x weekly - 1 sets - 10 reps - 5 seconds hold - Standing hip abduction alternating legs  - 1-2 x daily - 7 x weekly - 1 sets - 10 reps - 5 seconds hold -  Standing March with Counter Support  - 1-2 x daily - 7 x weekly - 1 sets - 10 reps - 5 seconds hold  ASSESSMENT: Pt met all LTGs including improving Functional Gait Assessment to 30/30 and normal step strategy response indicating low fall risk.  Patient is very pleased with her improvements with PT. She appears to understand recommendations for ongoing fitness plan.   OBJECTIVE IMPAIRMENTS Abnormal gait, decreased activity tolerance, decreased balance, decreased endurance, decreased mobility, difficulty walking, decreased ROM, decreased strength, postural dysfunction, and pain.   ACTIVITY LIMITATIONS carrying, lifting, bending, squatting, stairs, toileting, dressing, locomotion level, and caring for others  PARTICIPATION LIMITATIONS: meal prep, cleaning, laundry, driving, community activity, and yard work  [place], Time since onset of injury/illness/exacerbation, and 3+ comorbidities: see PMH  are also affecting patient's functional outcome.   REHAB POTENTIAL: Good  CLINICAL DECISION MAKING: Evolving/moderate complexity  EVALUATION COMPLEXITY: Low   GOALS: Goals reviewed with patient? Yes  SHORT TERM GOALS: Target date: 08/10/22  Patient will be independent with updated HEP to continue progressing outside of clinic. Baseline: see objective data Goal status: Met 08/09/2022  2.  Patient able to recover balance within 1 step for step strategy testing anterior, posterior & laterally. Baseline: see objective data Goal status: partially met 08/09/2022   UPDATED LONG TERM GOALS: Target date:  08/30/22  Patient will score FOTO >/= 55% to reflect improved self-perceived functional ability. Baseline: see objective data Goal status: MET 08/30/2022   2.  Patient scores above a 25/30 on the FGA to represent low fall risk with gait activities. Baseline: see objective data Goal status: MET 08/30/2022  3.  Patient reports </= 2/10 back and knee pain with standing, gait, and stair activity. Baseline: see objective data Goal status:  MET 08/29/2022  4.  Patient Rt knee seated LAQ to -2* to represent improved knee range and quadriceps strength. Baseline: see objective data Goal status: MET 08/29/2022  5.  Patient safely ambulates 500' including ramp, curb, and stairs independently to allow for household and community mobility. Baseline: see objective data Goal status: MET 08/29/2022  6.  Patient verbalizes and demonstrates ability to perform ADLs, household chores, and yardwork. Baseline: see objective data Goal status: MET 08/29/2022  PLAN: PT FREQUENCY: 2x/week  PT DURATION: 6 weeks  PLANNED INTERVENTIONS: Therapeutic exercises, Therapeutic activity, Neuromuscular re-education, Balance training, Gait training, Patient/Family education, Self Care, Joint mobilization, Stair training, Vestibular training, DME instructions, Dry Needling, Electrical stimulation, Spinal mobilization, Cryotherapy, Moist heat, Ultrasound, Ionotophoresis 77m/ml Dexamethasone, Manual therapy, Re-evaluation, and physical performance tests .  PLAN FOR NEXT SESSION:  discharge PT    RJamey Reas PT, DPT 08/30/2022, 1:33 PM

## 2022-09-04 ENCOUNTER — Encounter: Payer: Medicare Other | Admitting: Physical Therapy

## 2022-09-06 ENCOUNTER — Encounter: Payer: Medicare Other | Admitting: Physical Therapy

## 2022-10-12 ENCOUNTER — Encounter: Payer: Self-pay | Admitting: General Surgery

## 2022-12-20 ENCOUNTER — Ambulatory Visit: Payer: Medicare Other | Admitting: Physician Assistant

## 2023-01-10 ENCOUNTER — Ambulatory Visit: Payer: Medicare Other | Admitting: Orthopaedic Surgery

## 2023-07-25 ENCOUNTER — Other Ambulatory Visit: Payer: Self-pay | Admitting: Internal Medicine

## 2023-07-25 DIAGNOSIS — M810 Age-related osteoporosis without current pathological fracture: Secondary | ICD-10-CM

## 2024-09-10 DIAGNOSIS — Z1331 Encounter for screening for depression: Secondary | ICD-10-CM | POA: Diagnosis not present

## 2024-09-10 DIAGNOSIS — Z0000 Encounter for general adult medical examination without abnormal findings: Secondary | ICD-10-CM | POA: Diagnosis not present

## 2024-09-10 DIAGNOSIS — E559 Vitamin D deficiency, unspecified: Secondary | ICD-10-CM | POA: Diagnosis not present

## 2024-09-10 DIAGNOSIS — Z136 Encounter for screening for cardiovascular disorders: Secondary | ICD-10-CM | POA: Diagnosis not present

## 2025-01-22 ENCOUNTER — Ambulatory Visit: Admitting: Diagnostic Neuroimaging
